# Patient Record
Sex: Female | Born: 1960 | Race: Black or African American | Hispanic: No | Marital: Married | Smoking: Former smoker
Health system: Southern US, Community
[De-identification: ages and names within clinical notes are randomized; demographics above are authoritative.]

## PROBLEM LIST (undated history)

## (undated) DIAGNOSIS — F32A Depression, unspecified: Secondary | ICD-10-CM

## (undated) DIAGNOSIS — E7800 Pure hypercholesterolemia, unspecified: Secondary | ICD-10-CM

## (undated) DIAGNOSIS — K589 Irritable bowel syndrome without diarrhea: Secondary | ICD-10-CM

## (undated) DIAGNOSIS — L309 Dermatitis, unspecified: Secondary | ICD-10-CM

## (undated) DIAGNOSIS — M545 Low back pain, unspecified: Secondary | ICD-10-CM

## (undated) DIAGNOSIS — T7840XA Allergy, unspecified, initial encounter: Secondary | ICD-10-CM

## (undated) DIAGNOSIS — A630 Anogenital (venereal) warts: Secondary | ICD-10-CM

## (undated) DIAGNOSIS — I499 Cardiac arrhythmia, unspecified: Secondary | ICD-10-CM

## (undated) DIAGNOSIS — I10 Essential (primary) hypertension: Secondary | ICD-10-CM

## (undated) DIAGNOSIS — D219 Benign neoplasm of connective and other soft tissue, unspecified: Secondary | ICD-10-CM

## (undated) DIAGNOSIS — E119 Type 2 diabetes mellitus without complications: Secondary | ICD-10-CM

## (undated) DIAGNOSIS — F329 Major depressive disorder, single episode, unspecified: Secondary | ICD-10-CM

## (undated) DIAGNOSIS — R011 Cardiac murmur, unspecified: Secondary | ICD-10-CM

## (undated) HISTORY — DX: Low back pain, unspecified: M54.50

## (undated) HISTORY — DX: Benign neoplasm of connective and other soft tissue, unspecified: D21.9

## (undated) HISTORY — DX: Low back pain: M54.5

## (undated) HISTORY — DX: Type 2 diabetes mellitus without complications: E11.9

## (undated) HISTORY — DX: Dermatitis, unspecified: L30.9

## (undated) HISTORY — DX: Cardiac arrhythmia, unspecified: I49.9

## (undated) HISTORY — PX: TUBAL LIGATION: SHX77

## (undated) HISTORY — DX: Irritable bowel syndrome, unspecified: K58.9

## (undated) HISTORY — DX: Essential (primary) hypertension: I10

## (undated) HISTORY — PX: MYOMECTOMY: SHX85

## (undated) HISTORY — DX: Major depressive disorder, single episode, unspecified: F32.9

## (undated) HISTORY — DX: Depression, unspecified: F32.A

## (undated) HISTORY — PX: LUMBAR LAMINECTOMY: SHX95

## (undated) HISTORY — DX: Anogenital (venereal) warts: A63.0

## (undated) HISTORY — DX: Cardiac murmur, unspecified: R01.1

## (undated) HISTORY — DX: Allergy, unspecified, initial encounter: T78.40XA

---

## 1999-08-11 ENCOUNTER — Encounter: Admission: RE | Admit: 1999-08-11 | Discharge: 1999-10-10 | Payer: Self-pay | Admitting: Orthopaedic Surgery

## 2000-01-11 ENCOUNTER — Encounter: Admission: RE | Admit: 2000-01-11 | Discharge: 2000-01-11 | Payer: Self-pay | Admitting: Family Medicine

## 2000-01-11 ENCOUNTER — Encounter: Payer: Self-pay | Admitting: Family Medicine

## 2001-01-06 ENCOUNTER — Emergency Department (HOSPITAL_COMMUNITY): Admission: EM | Admit: 2001-01-06 | Discharge: 2001-01-06 | Payer: Self-pay | Admitting: Emergency Medicine

## 2001-03-23 ENCOUNTER — Inpatient Hospital Stay (HOSPITAL_COMMUNITY): Admission: EM | Admit: 2001-03-23 | Discharge: 2001-03-27 | Payer: Self-pay

## 2001-03-25 ENCOUNTER — Encounter: Payer: Self-pay | Admitting: *Deleted

## 2001-08-02 ENCOUNTER — Emergency Department (HOSPITAL_COMMUNITY): Admission: EM | Admit: 2001-08-02 | Discharge: 2001-08-02 | Payer: Self-pay | Admitting: Emergency Medicine

## 2001-08-02 ENCOUNTER — Encounter: Payer: Self-pay | Admitting: Emergency Medicine

## 2002-09-14 ENCOUNTER — Other Ambulatory Visit: Admission: RE | Admit: 2002-09-14 | Discharge: 2002-09-14 | Payer: Self-pay | Admitting: Obstetrics and Gynecology

## 2002-11-04 ENCOUNTER — Ambulatory Visit (HOSPITAL_COMMUNITY): Admission: RE | Admit: 2002-11-04 | Discharge: 2002-11-04 | Payer: Self-pay | Admitting: Obstetrics and Gynecology

## 2002-11-04 ENCOUNTER — Encounter (INDEPENDENT_AMBULATORY_CARE_PROVIDER_SITE_OTHER): Payer: Self-pay

## 2003-01-14 ENCOUNTER — Encounter: Payer: Self-pay | Admitting: Emergency Medicine

## 2003-01-14 ENCOUNTER — Emergency Department (HOSPITAL_COMMUNITY): Admission: EM | Admit: 2003-01-14 | Discharge: 2003-01-14 | Payer: Self-pay | Admitting: Emergency Medicine

## 2004-11-03 ENCOUNTER — Emergency Department (HOSPITAL_COMMUNITY): Admission: EM | Admit: 2004-11-03 | Discharge: 2004-11-03 | Payer: Self-pay | Admitting: Emergency Medicine

## 2005-07-18 ENCOUNTER — Emergency Department (HOSPITAL_COMMUNITY): Admission: EM | Admit: 2005-07-18 | Discharge: 2005-07-18 | Payer: Self-pay | Admitting: Emergency Medicine

## 2005-12-03 ENCOUNTER — Emergency Department (HOSPITAL_COMMUNITY): Admission: EM | Admit: 2005-12-03 | Discharge: 2005-12-03 | Payer: Self-pay | Admitting: Family Medicine

## 2005-12-12 ENCOUNTER — Ambulatory Visit (HOSPITAL_COMMUNITY): Admission: RE | Admit: 2005-12-12 | Discharge: 2005-12-12 | Payer: Self-pay | Admitting: Internal Medicine

## 2005-12-12 ENCOUNTER — Ambulatory Visit: Payer: Self-pay | Admitting: Internal Medicine

## 2005-12-20 ENCOUNTER — Ambulatory Visit: Payer: Self-pay | Admitting: Internal Medicine

## 2005-12-22 ENCOUNTER — Ambulatory Visit (HOSPITAL_COMMUNITY): Admission: RE | Admit: 2005-12-22 | Discharge: 2005-12-22 | Payer: Self-pay | Admitting: Internal Medicine

## 2005-12-26 ENCOUNTER — Ambulatory Visit: Payer: Self-pay | Admitting: Internal Medicine

## 2005-12-31 ENCOUNTER — Ambulatory Visit: Payer: Self-pay | Admitting: Internal Medicine

## 2006-08-23 ENCOUNTER — Emergency Department (HOSPITAL_COMMUNITY): Admission: EM | Admit: 2006-08-23 | Discharge: 2006-08-23 | Payer: Self-pay | Admitting: Family Medicine

## 2006-09-18 ENCOUNTER — Encounter: Admission: RE | Admit: 2006-09-18 | Discharge: 2006-10-28 | Payer: Self-pay | Admitting: Specialist

## 2007-02-20 ENCOUNTER — Emergency Department (HOSPITAL_COMMUNITY): Admission: EM | Admit: 2007-02-20 | Discharge: 2007-02-21 | Payer: Self-pay | Admitting: Emergency Medicine

## 2007-02-21 ENCOUNTER — Ambulatory Visit: Payer: Self-pay | Admitting: Family Medicine

## 2007-02-21 LAB — CONVERTED CEMR LAB
ALT: 20 units/L (ref 0–40)
AST: 20 units/L (ref 0–37)
Basophils Relative: 0.4 % (ref 0.0–1.0)
Bilirubin, Direct: 0.2 mg/dL (ref 0.0–0.3)
Calcium: 9.8 mg/dL (ref 8.4–10.5)
Chloride: 105 meq/L (ref 96–112)
Eosinophils Absolute: 0.2 10*3/uL (ref 0.0–0.6)
Eosinophils Relative: 1.6 % (ref 0.0–5.0)
GFR calc non Af Amer: 97 mL/min
Glucose, Bld: 87 mg/dL (ref 70–99)
MCV: 88 fL (ref 78.0–100.0)
Platelets: 384 10*3/uL (ref 150–400)
RBC: 4.66 M/uL (ref 3.87–5.11)
WBC: 10.4 10*3/uL (ref 4.5–10.5)

## 2007-03-06 ENCOUNTER — Ambulatory Visit: Payer: Self-pay | Admitting: Family Medicine

## 2007-03-06 LAB — CONVERTED CEMR LAB
LDL Cholesterol: 106 mg/dL — ABNORMAL HIGH (ref 0–99)
Total CHOL/HDL Ratio: 3.8
Triglycerides: 79 mg/dL (ref 0–149)
VLDL: 16 mg/dL (ref 0–40)

## 2007-03-12 ENCOUNTER — Ambulatory Visit: Payer: Self-pay | Admitting: Family Medicine

## 2007-09-22 DIAGNOSIS — I10 Essential (primary) hypertension: Secondary | ICD-10-CM | POA: Insufficient documentation

## 2007-09-22 DIAGNOSIS — F329 Major depressive disorder, single episode, unspecified: Secondary | ICD-10-CM

## 2007-09-30 ENCOUNTER — Ambulatory Visit: Payer: Self-pay | Admitting: Family Medicine

## 2007-09-30 ENCOUNTER — Telehealth: Payer: Self-pay | Admitting: Family Medicine

## 2007-09-30 DIAGNOSIS — G5760 Lesion of plantar nerve, unspecified lower limb: Secondary | ICD-10-CM

## 2007-09-30 DIAGNOSIS — M79609 Pain in unspecified limb: Secondary | ICD-10-CM | POA: Insufficient documentation

## 2007-09-30 DIAGNOSIS — R109 Unspecified abdominal pain: Secondary | ICD-10-CM

## 2007-09-30 DIAGNOSIS — J0120 Acute ethmoidal sinusitis, unspecified: Secondary | ICD-10-CM | POA: Insufficient documentation

## 2007-10-01 LAB — CONVERTED CEMR LAB
ALT: 19 units/L (ref 0–35)
AST: 17 units/L (ref 0–37)
Alkaline Phosphatase: 102 units/L (ref 39–117)
Basophils Relative: 0.5 % (ref 0.0–1.0)
Bilirubin, Direct: 0.1 mg/dL (ref 0.0–0.3)
CO2: 30 meq/L (ref 19–32)
Calcium: 9.4 mg/dL (ref 8.4–10.5)
Chloride: 108 meq/L (ref 96–112)
Eosinophils Relative: 1.8 % (ref 0.0–5.0)
Glucose, Bld: 98 mg/dL (ref 70–99)
Lymphocytes Relative: 35.8 % (ref 12.0–46.0)
Platelets: 364 10*3/uL (ref 150–400)
RBC: 4.28 M/uL (ref 3.87–5.11)
RDW: 13.7 % (ref 11.5–14.6)
Total Protein: 6.5 g/dL (ref 6.0–8.3)
WBC: 9.4 10*3/uL (ref 4.5–10.5)

## 2008-01-12 ENCOUNTER — Ambulatory Visit: Payer: Self-pay | Admitting: Family Medicine

## 2008-01-12 DIAGNOSIS — R1031 Right lower quadrant pain: Secondary | ICD-10-CM

## 2008-01-15 ENCOUNTER — Ambulatory Visit: Payer: Self-pay | Admitting: Cardiology

## 2008-12-17 HISTORY — PX: LUMBAR LAMINECTOMY: SHX95

## 2009-08-09 ENCOUNTER — Ambulatory Visit: Payer: Self-pay | Admitting: Family Medicine

## 2009-08-09 DIAGNOSIS — M76899 Other specified enthesopathies of unspecified lower limb, excluding foot: Secondary | ICD-10-CM | POA: Insufficient documentation

## 2009-08-26 ENCOUNTER — Telehealth: Payer: Self-pay | Admitting: Family Medicine

## 2009-08-30 ENCOUNTER — Ambulatory Visit: Payer: Self-pay | Admitting: Family Medicine

## 2009-08-30 DIAGNOSIS — M5430 Sciatica, unspecified side: Secondary | ICD-10-CM | POA: Insufficient documentation

## 2009-10-27 ENCOUNTER — Ambulatory Visit: Payer: Self-pay | Admitting: Family Medicine

## 2009-12-19 ENCOUNTER — Ambulatory Visit: Payer: Self-pay | Admitting: Family Medicine

## 2009-12-19 LAB — CONVERTED CEMR LAB
Bilirubin Urine: NEGATIVE
Protein, U semiquant: NEGATIVE
Urobilinogen, UA: 0.2
WBC Urine, dipstick: NEGATIVE

## 2009-12-21 ENCOUNTER — Telehealth: Payer: Self-pay | Admitting: Family Medicine

## 2009-12-22 LAB — CONVERTED CEMR LAB
ALT: 16 units/L (ref 0–35)
AST: 17 units/L (ref 0–37)
Alkaline Phosphatase: 88 units/L (ref 39–117)
BUN: 8 mg/dL (ref 6–23)
Basophils Absolute: 0 10*3/uL (ref 0.0–0.1)
Bilirubin, Direct: 0 mg/dL (ref 0.0–0.3)
Calcium: 9 mg/dL (ref 8.4–10.5)
Cholesterol: 189 mg/dL (ref 0–200)
Eosinophils Relative: 2.3 % (ref 0.0–5.0)
GFR calc non Af Amer: 115.41 mL/min (ref >60)
Glucose, Bld: 121 mg/dL — ABNORMAL HIGH (ref 70–99)
HCT: 41.8 % (ref 36.0–46.0)
HCV Ab: NEGATIVE
HDL: 50.3 mg/dL (ref >39.00)
Hep B S Ab: POSITIVE — AB
LDL Cholesterol: 110 mg/dL — ABNORMAL HIGH (ref 0–99)
Lymphocytes Relative: 31 % (ref 12.0–46.0)
Lymphs Abs: 2.5 10*3/uL (ref 0.7–4.0)
Monocytes Relative: 6.7 % (ref 3.0–12.0)
Neutrophils Relative %: 59.5 % (ref 43.0–77.0)
Platelets: 322 10*3/uL (ref 150.0–400.0)
Potassium: 3.8 meq/L (ref 3.5–5.1)
RDW: 13.5 % (ref 11.5–14.6)
Total Bilirubin: 0.7 mg/dL (ref 0.3–1.2)
VLDL: 28.6 mg/dL (ref 0.0–40.0)
WBC: 8 10*3/uL (ref 4.5–10.5)

## 2009-12-23 ENCOUNTER — Encounter: Payer: Self-pay | Admitting: Family Medicine

## 2009-12-23 ENCOUNTER — Encounter: Admission: RE | Admit: 2009-12-23 | Discharge: 2009-12-23 | Payer: Self-pay | Admitting: Orthopedic Surgery

## 2009-12-26 ENCOUNTER — Ambulatory Visit: Payer: Self-pay | Admitting: Family Medicine

## 2009-12-26 ENCOUNTER — Other Ambulatory Visit: Admission: RE | Admit: 2009-12-26 | Discharge: 2009-12-26 | Payer: Self-pay | Admitting: Family Medicine

## 2009-12-26 LAB — HM PAP SMEAR

## 2009-12-28 ENCOUNTER — Encounter: Payer: Self-pay | Admitting: Family Medicine

## 2010-01-05 ENCOUNTER — Encounter: Payer: Self-pay | Admitting: Family Medicine

## 2010-01-06 ENCOUNTER — Ambulatory Visit: Payer: Self-pay | Admitting: Family Medicine

## 2010-01-06 DIAGNOSIS — B373 Candidiasis of vulva and vagina: Secondary | ICD-10-CM

## 2010-01-06 DIAGNOSIS — M545 Low back pain: Secondary | ICD-10-CM

## 2010-01-06 DIAGNOSIS — F331 Major depressive disorder, recurrent, moderate: Secondary | ICD-10-CM

## 2010-01-23 ENCOUNTER — Ambulatory Visit: Payer: Self-pay | Admitting: Family Medicine

## 2010-01-23 DIAGNOSIS — J209 Acute bronchitis, unspecified: Secondary | ICD-10-CM

## 2010-01-23 LAB — CONVERTED CEMR LAB
Glucose, Urine, Semiquant: NEGATIVE
Protein, U semiquant: NEGATIVE
WBC Urine, dipstick: NEGATIVE
pH: 5

## 2010-02-23 ENCOUNTER — Inpatient Hospital Stay (HOSPITAL_COMMUNITY): Admission: RE | Admit: 2010-02-23 | Discharge: 2010-02-26 | Payer: Self-pay | Admitting: Specialist

## 2010-02-28 ENCOUNTER — Encounter: Payer: Self-pay | Admitting: Family Medicine

## 2010-03-16 ENCOUNTER — Telehealth: Payer: Self-pay | Admitting: Family Medicine

## 2010-03-17 ENCOUNTER — Ambulatory Visit: Payer: Self-pay | Admitting: Family Medicine

## 2010-03-17 DIAGNOSIS — R079 Chest pain, unspecified: Secondary | ICD-10-CM

## 2010-03-17 DIAGNOSIS — R002 Palpitations: Secondary | ICD-10-CM

## 2010-03-17 DIAGNOSIS — K219 Gastro-esophageal reflux disease without esophagitis: Secondary | ICD-10-CM | POA: Insufficient documentation

## 2010-06-14 ENCOUNTER — Telehealth: Payer: Self-pay | Admitting: Family Medicine

## 2010-06-20 ENCOUNTER — Telehealth: Payer: Self-pay | Admitting: Family Medicine

## 2010-06-23 ENCOUNTER — Ambulatory Visit: Payer: Self-pay | Admitting: Family Medicine

## 2010-06-27 LAB — CONVERTED CEMR LAB
Glucose, Bld: 101 mg/dL — ABNORMAL HIGH (ref 70–99)
HDL: 41.9 mg/dL (ref >39.00)
Hgb A1c MFr Bld: 5.7 % (ref 4.6–6.5)
Total CHOL/HDL Ratio: 4
VLDL: 18.8 mg/dL (ref 0.0–40.0)

## 2010-06-28 ENCOUNTER — Ambulatory Visit: Payer: Self-pay | Admitting: Family Medicine

## 2010-06-28 DIAGNOSIS — N760 Acute vaginitis: Secondary | ICD-10-CM

## 2010-06-29 ENCOUNTER — Observation Stay (HOSPITAL_COMMUNITY): Admission: AD | Admit: 2010-06-29 | Discharge: 2010-06-30 | Payer: Self-pay | Admitting: Internal Medicine

## 2010-06-29 ENCOUNTER — Ambulatory Visit: Payer: Self-pay | Admitting: Diagnostic Radiology

## 2010-06-29 ENCOUNTER — Ambulatory Visit: Payer: Self-pay | Admitting: Cardiovascular Disease

## 2010-06-29 ENCOUNTER — Encounter: Payer: Self-pay | Admitting: Emergency Medicine

## 2010-06-30 ENCOUNTER — Encounter (INDEPENDENT_AMBULATORY_CARE_PROVIDER_SITE_OTHER): Payer: Self-pay | Admitting: Internal Medicine

## 2010-07-03 ENCOUNTER — Telehealth: Payer: Self-pay | Admitting: Family Medicine

## 2010-07-12 ENCOUNTER — Ambulatory Visit: Payer: Self-pay | Admitting: Family Medicine

## 2010-07-12 DIAGNOSIS — R55 Syncope and collapse: Secondary | ICD-10-CM

## 2010-09-11 ENCOUNTER — Ambulatory Visit: Payer: Self-pay | Admitting: Family Medicine

## 2010-09-11 DIAGNOSIS — M1990 Unspecified osteoarthritis, unspecified site: Secondary | ICD-10-CM | POA: Insufficient documentation

## 2010-11-12 ENCOUNTER — Emergency Department (HOSPITAL_COMMUNITY): Admission: EM | Admit: 2010-11-12 | Discharge: 2010-11-12 | Payer: Self-pay | Admitting: Emergency Medicine

## 2010-11-13 ENCOUNTER — Ambulatory Visit: Payer: Self-pay | Admitting: Family Medicine

## 2011-01-07 ENCOUNTER — Encounter: Payer: Self-pay | Admitting: Internal Medicine

## 2011-01-15 ENCOUNTER — Encounter: Payer: Self-pay | Admitting: Family Medicine

## 2011-01-16 NOTE — Assessment & Plan Note (Signed)
Summary: cough/chest congestion/low grade fever/cjr   Vital Signs:  Patient profile:   50 year old female Menstrual status:  regular Weight:      183 pounds Temp:     97.8 degrees F oral Pulse rate:   76 / minute BP sitting:   142 / 100  (left arm) Cuff size:   large  Vitals Entered By: Alfred Levins, CMA (January 23, 2010 10:55 AM) CC: cough x2 wks, dysuria   History of Present Illness: Here for 2 weeks of chest congestion and coughing up yellow sputum. Low grade fevers off and on. No HA or ST or NVD. On Nyquil.   Current Medications (verified): 1)  Lisinopril 20 Mg  Tabs (Lisinopril) .Marland Kitchen.. 1 By Mouth Once Daily 2)  Hydrocodone-Acetaminophen 5-500 Mg Tabs (Hydrocodone-Acetaminophen) .Marland Kitchen.. 1 By Mouth Once Daily 3)  Voltaren 1 % Gel (Diclofenac Sodium) .... Once Daily 4)  Diclofenac Sodium 50 Mg Tbec (Diclofenac Sodium) .Marland Kitchen.. 1 By Mouth 2 Times Daily. Take With Food 5)  Lexapro 10 Mg Tabs (Escitalopram Oxalate) .... Once Daily  Allergies (verified): No Known Drug Allergies  Past History:  Past Medical History: Reviewed history from 01/06/2010 and no changes required. Depression Hypertension UTIs Genital Warts Heart Arrhythmia Heart Murmur uterine fibroids, sees Dr. Dareen Piano Low back pain, has a herniated lumbar disc, sees Dr. August Saucer  Review of Systems  The patient denies anorexia, weight loss, weight gain, vision loss, decreased hearing, hoarseness, chest pain, syncope, dyspnea on exertion, peripheral edema, headaches, hemoptysis, abdominal pain, melena, hematochezia, severe indigestion/heartburn, hematuria, incontinence, genital sores, muscle weakness, suspicious skin lesions, transient blindness, difficulty walking, depression, unusual weight change, abnormal bleeding, enlarged lymph nodes, angioedema, breast masses, and testicular masses.    Physical Exam  General:  Well-developed,well-nourished,in no acute distress; alert,appropriate and cooperative throughout  examination Head:  Normocephalic and atraumatic without obvious abnormalities. No apparent alopecia or balding. Eyes:  No corneal or conjunctival inflammation noted. EOMI. Perrla. Funduscopic exam benign, without hemorrhages, exudates or papilledema. Vision grossly normal. Ears:  External ear exam shows no significant lesions or deformities.  Otoscopic examination reveals clear canals, tympanic membranes are intact bilaterally without bulging, retraction, inflammation or discharge. Hearing is grossly normal bilaterally. Nose:  External nasal examination shows no deformity or inflammation. Nasal mucosa are pink and moist without lesions or exudates. Mouth:  Oral mucosa and oropharynx without lesions or exudates.  Teeth in good repair. Neck:  No deformities, masses, or tenderness noted. Lungs:  scattered rhonchi and wheezes, no rales   Impression & Recommendations:  Problem # 1:  ACUTE BRONCHITIS (ICD-466.0)  Her updated medication list for this problem includes:    Augmentin 875-125 Mg Tabs (Amoxicillin-pot clavulanate) .Marland Kitchen..Marland Kitchen Two times a day    Hydromet 5-1.5 Mg/75ml Syrp (Hydrocodone-homatropine) .Marland Kitchen... 1 tsp q 4 hours as needed cough    Proair Hfa 108 (90 Base) Mcg/act Aers (Albuterol sulfate) .Marland Kitchen... 2 puffs q 4 hours as needed sob  Orders: Rocephin  250mg  (Z6109) Admin of Therapeutic Inj  intramuscular or subcutaneous (60454)  Complete Medication List: 1)  Lisinopril 20 Mg Tabs (Lisinopril) .Marland Kitchen.. 1 by mouth once daily 2)  Hydrocodone-acetaminophen 5-500 Mg Tabs (Hydrocodone-acetaminophen) .Marland Kitchen.. 1 by mouth once daily 3)  Voltaren 1 % Gel (Diclofenac sodium) .... Once daily 4)  Diclofenac Sodium 50 Mg Tbec (Diclofenac sodium) .Marland Kitchen.. 1 by mouth 2 times daily. take with food 5)  Lexapro 10 Mg Tabs (Escitalopram oxalate) .... Once daily 6)  Augmentin 875-125 Mg Tabs (Amoxicillin-pot clavulanate) .... Two times  a day 7)  Hydromet 5-1.5 Mg/81ml Syrp (Hydrocodone-homatropine) .Marland Kitchen.. 1 tsp q 4 hours as  needed cough 8)  Proair Hfa 108 (90 Base) Mcg/act Aers (Albuterol sulfate) .... 2 puffs q 4 hours as needed sob  Other Orders: UA Dipstick w/o Micro (manual) (27253)  Patient Instructions: 1)  Please schedule a follow-up appointment as needed . Out of work from 01-22-10 until 01-25-10.  Prescriptions: PROAIR HFA 108 (90 BASE) MCG/ACT AERS (ALBUTEROL SULFATE) 2 puffs q 4 hours as needed sob  #1 x 2   Entered and Authorized by:   Nelwyn Salisbury MD   Signed by:   Nelwyn Salisbury MD on 01/23/2010   Method used:   Print then Give to Patient   RxID:   [id number] HYDROMET 5-1.5 MG/5ML SYRP (HYDROCODONE-HOMATROPINE) 1 tsp q 4 hours as needed cough  #240 x 0   Entered and Authorized by:   Nelwyn Salisbury MD   Signed by:   Nelwyn Salisbury MD on 01/23/2010   Method used:   Print then Give to Patient   RxID:   [id number] AUGMENTIN 875-125 MG TABS (AMOXICILLIN-POT CLAVULANATE) two times a day  #20 x 0   Entered and Authorized by:   Nelwyn Salisbury MD   Signed by:   Nelwyn Salisbury MD on 01/23/2010   Method used:   Print then Give to Patient   RxID:   [id number]   Laboratory Results   Urine Tests   Date/Time Reported: January 23, 2010 10:56 AM  Routine Urinalysis   Color: yellow Appearance: Cloudy Glucose: negative   (Normal Range: Negative) Bilirubin: small   (Normal Range: Negative) Ketone: negative   (Normal Range: Negative) Spec. Gravity: >=1.030   (Normal Range: 1.003-1.035) Blood: trace-lysed   (Normal Range: Negative) pH: 5.0   (Normal Range: 5.0-8.0) Protein: negative   (Normal Range: Negative) Urobilinogen: 0.2   (Normal Range: 0-1) Nitrite: negative   (Normal Range: Negative) Leukocyte Esterace: negative   (Normal Range: Negative)    Comments: Alfred Levins, CMA  January 23, 2010 11:02 AM      Medication Administration  Injection # 1:    Medication: Rocephin  250mg     Diagnosis: ACUTE BRONCHITIS (ICD-466.0)    Route: IM    Site: RUOQ gluteus    Exp Date:  9/13    Lot #: [id number]    Mfr: Novaplus    Comments: 1g    Patient tolerated injection without complications    Given by: Alfred Levins, CMA (January 23, 2010 3:43 PM)  Orders Added: 1)  UA Dipstick w/o Micro (manual) [81002] 2)  Est. Patient Level IV [42706] 3)  Rocephin  250mg  [J0696] 4)  Admin of Therapeutic Inj  intramuscular or subcutaneous [23762]

## 2011-01-16 NOTE — Progress Notes (Signed)
Summary: REQ FOR LABWORK  Phone Note Call from Patient   Caller: Patient Reason for Call: Lab or Test Results Summary of Call: Pt called to make appt for labwork... adv she is having health issues and wants to have labwork this week so that it will be available for her appt with Dr Clent Ridges next week.... EMR shows that labwork in January 2011 showed slightly high levels for cholesterol and glucose - pt request that these be re-checked and wants to come in this week for labwork (wants results to be available for Dr Clent Ridges next week).... Pt adv that she is feeling bad (weak / lethargic), doesn't want to come in for OV because she wants to see Dr Clent Ridges but would like to have labwork.Marland KitchenMarland KitchenMarland KitchenPt adv that at the time of her cpx in Jan 2011, Dr Clent Ridges said to re-ck labs in 6 mths but there isn't an order for labs...Marland KitchenMarland KitchenCan you advise order?   Initial call taken by: Debbra Riding,  June 20, 2010 2:38 PM  Follow-up for Phone Call        OK to check CBG, lipids , and A1C.  Pt has to understand that any other labs that might need to be checked relevant to fatigue would have to be determined after office assessment. Follow-up by: [name] MD,  June 20, 2010 4:09 PM  Additional Follow-up for Phone Call Additional follow up Details #1::        Called pt and left msg for her to call back so that appt for labwork can be scheduled.  Additional Follow-up by: Debbra Riding,  June 20, 2010 4:30 PM    Additional Follow-up for Phone Call Additional follow up Details #2::    Contacted pt at work # and advised her of Dr Mar Daring instructions .Marland KitchenMarland KitchenMarland Kitchenappt for labwork was scheduled for Thursday, 06/22/2010 at [facility]... Pt acknowledged same.  Follow-up by: Debbra Riding,  June 20, 2010 4:36 PM

## 2011-01-16 NOTE — Letter (Signed)
Summary: Application for Handicapped Placard  Application for Handicapped Placard   Imported By: Maryln Gottron 09/12/2010 15:43:40  _____________________________________________________________________  External Attachment:    Type:   Image     Comment:   External Document

## 2011-01-16 NOTE — Assessment & Plan Note (Signed)
Summary: recheck- ok per debby//ccm   Vital Signs:  Patient profile:   50 year old female Menstrual status:  regular Weight:      181 pounds Temp:     97.7 degrees F oral Pulse rate:   84 / minute BP sitting:   162 / 100  (left arm) Cuff size:   large  Vitals Entered By: Alfred Levins, CMA (January 06, 2010 10:10 AM) CC: yeast infection   History of Present Illness: Here for 2 reasons. First she continues to have a vaginal tan or white DC, and now it is very itchy. On 12-26-09 she was here for a Pap smear, and she had signs of a bacterial infection. She took a course of Flagyl, and in some ways got better. however the DC changed as above. He Pap smear showed showed some fungal elements. second, she has been very streesed lately, especially by her 4 children. They have things going on in their lives that she worries about. She gets sad, angry, tired, and tearful. Dr. Dareen Piano had treated her briefly with Lexapro about 2 years ago for similar symptoms, and this was very effective. She has used prozac in the past as well, but did not tolerate it well. She is exercising and losing weight.   Current Medications (verified): 1)  Lisinopril 20 Mg  Tabs (Lisinopril) .Marland Kitchen.. 1 By Mouth Once Daily 2)  Meloxicam 7.5 Mg Tabs (Meloxicam) .... As Needed 3)  Hydrocodone-Acetaminophen 5-500 Mg Tabs (Hydrocodone-Acetaminophen) .Marland Kitchen.. 1 By Mouth Once Daily 4)  Voltaren 1 % Gel (Diclofenac Sodium) .... Once Daily 5)  Diclofenac Sodium 50 Mg Tbec (Diclofenac Sodium) .Marland Kitchen.. 1 By Mouth 2 Times Daily. Take With Food  Allergies (verified): No Known Drug Allergies  Past History:  Past Medical History: Depression Hypertension UTIs Genital Warts Heart Arrhythmia Heart Murmur uterine fibroids, sees Dr. Dareen Piano Low back pain, has a herniated lumbar disc, sees Dr. August Saucer  Past Surgical History: Reviewed history from 12/26/2009 and no changes required. Tubal ligation laser ablation of uterine fibroids, per Dr.  Dareen Piano in 2000  Review of Systems  The patient denies anorexia, fever, weight gain, vision loss, decreased hearing, hoarseness, chest pain, syncope, dyspnea on exertion, peripheral edema, prolonged cough, headaches, hemoptysis, abdominal pain, melena, hematochezia, severe indigestion/heartburn, hematuria, incontinence, genital sores, muscle weakness, suspicious skin lesions, transient blindness, difficulty walking, unusual weight change, abnormal bleeding, enlarged lymph nodes, angioedema, breast masses, and testicular masses.    Physical Exam  General:  Well-developed,well-nourished,in no acute distress; alert,appropriate and cooperative throughout examination Psych:  Oriented X3, memory intact for recent and remote, normally interactive, good eye contact, depressed affect, and tearful.     Impression & Recommendations:  Problem # 1:  DEPRESSION, MODERATE, RECURRENT (ICD-296.32)  Problem # 2:  CANDIDIASIS OF VULVA AND VAGINA (ICD-112.1)  Her updated medication list for this problem includes:    Fluconazole 150 Mg Tabs (Fluconazole) .Marland Kitchen... As needed  Complete Medication List: 1)  Lisinopril 20 Mg Tabs (Lisinopril) .Marland Kitchen.. 1 by mouth once daily 2)  Meloxicam 7.5 Mg Tabs (Meloxicam) .... As needed 3)  Hydrocodone-acetaminophen 5-500 Mg Tabs (Hydrocodone-acetaminophen) .Marland Kitchen.. 1 by mouth once daily 4)  Voltaren 1 % Gel (Diclofenac sodium) .... Once daily 5)  Diclofenac Sodium 50 Mg Tbec (Diclofenac sodium) .Marland Kitchen.. 1 by mouth 2 times daily. take with food 6)  Lexapro 10 Mg Tabs (Escitalopram oxalate) .... Once daily 7)  Fluconazole 150 Mg Tabs (Fluconazole) .... As needed  Patient Instructions: 1)  Please schedule a follow-up appointment  in 2 weeks.  Prescriptions: FLUCONAZOLE 150 MG TABS (FLUCONAZOLE) as needed  #1 x 5   Entered and Authorized by:   Nelwyn Salisbury MD   Signed by:   Nelwyn Salisbury MD on 01/06/2010   Method used:   Electronically to        [facility]  [address] [id number] (retail)        [address]       [place], [place]  [address]       Ph: [phone] or [phone]       Fax: [phone]   RxID:   [id number] LEXAPRO 10 MG TABS (ESCITALOPRAM OXALATE) once daily  #30 x 11   Entered and Authorized by:   Nelwyn Salisbury MD   Signed by:   Nelwyn Salisbury MD on 01/06/2010   Method used:   [facility]ically to        [facility]  [address] [id number] ([facility]       [address]       [place], [place]  [address]       Ph: [phone] or [phone]       Fax: [phone]   RxID:   [id number]

## 2011-01-16 NOTE — Assessment & Plan Note (Signed)
Summary: er fup//ccm   Vital Signs:  Patient profile:   50 year old female Menstrual status:  regular Weight:      184 pounds O2 Sat:      97 % Temp:     98.3 degrees F Pulse rate:   58 / minute BP sitting:   110 / 78  (left arm) Cuff size:   regular  Vitals Entered By: Pura Spice, RN (November 13, 2010 1:12 PM) CC: went to er yest after passing out at work . see ER reports   History of Present Illness: Here to follow up an ER visit yesterday for left flank pain and for a syncopal episode. For the past month she has had a persistent sharp pain in the left flank which starts in the left side of her back and radiates around to the front. She did have disc surgery in March of this year, and it went well at that time. Lately this left sided pain has been getting worse to the point that Etodolac and Vicodin 5/500 does not help. Yesterday at work she has some sharp pains here and suddenly fainted while at work. Her co-workers were able to catch her and lay her down before she fell. At the ER all tests were normal including labs and an abdominal CT scan. They gave her Percocet, but she cannot take this because it makes her sick. She has been out of work since then. Today the pain persists, but it is not as severe. No other abdominal symptoms. No nausea or fever. No urinary problems.   Allergies: 1)  ! * Hctz  Past History:  Past Medical History: Reviewed history from 03/17/2010 and no changes required. Depression Hypertension UTIs Genital Warts Heart Arrhythmia Heart Murmur uterine fibroids, sees Dr. Dareen Piano Low back pain, has a herniated lumbar disc, sees Dr. August Saucer multiple allergies, sees Dr. Stevphen Rochester  Past Surgical History: Tubal ligation laser ablation of uterine fibroids, per Dr. Dareen Piano in 2000 epidural steroid shot ot lower back per Dr. Naaman Plummer Jan. 2011 Lumbar laminectomy at L3-4 on 02-24-10 per Dr. Otelia Sergeant  Review of Systems  The patient denies anorexia,  fever, weight loss, weight gain, vision loss, decreased hearing, hoarseness, chest pain, syncope, dyspnea on exertion, peripheral edema, prolonged cough, headaches, hemoptysis, melena, hematochezia, severe indigestion/heartburn, hematuria, incontinence, genital sores, muscle weakness, suspicious skin lesions, transient blindness, difficulty walking, depression, unusual weight change, abnormal bleeding, enlarged lymph nodes, angioedema, breast masses, and testicular masses.    Physical Exam  General:  alert but uncomfortable. Walks with assistance.  Lungs:  Normal respiratory effort, chest expands symmetrically. Lungs are clear to auscultation, no crackles or wheezes. Heart:  Normal rate and regular rhythm. S1 and S2 normal without gallop, murmur, click, rub or other extra sounds. Abdomen:  Bowel sounds positive,abdomen soft and non-tender without masses, organomegaly or hernias noted. Msk:  tender over the left mid back and the left lower ribs. No masses. Full  ROM.    Impression & Recommendations:  Problem # 1:  FLANK PAIN (ICD-789.09)  Her updated medication list for this problem includes:    Etodolac 500 Mg Tabs (Etodolac) .Marland Kitchen..Marland Kitchen Two times a day    Oxycodone-acetaminophen 7.5-500 Mg Tabs (Oxycodone-acetaminophen)    Methocarbamol 750 Mg Tabs (Methocarbamol)    Vicodin Hp 10-660 Mg Tabs (Hydrocodone-acetaminophen) .Marland Kitchen... 1 q 6 hours as needed pain  Complete Medication List: 1)  Allergy Shots  2)  Lexapro 20 Mg Tabs (Escitalopram oxalate) .... Once daily 3)  Lisinopril 20 Mg Tabs (Lisinopril) .Marland Kitchen.. 1 once daily 4)  Amlodipine Besylate 5 Mg Tabs (Amlodipine besylate) .Marland Kitchen.. 1 once daily 5)  Etodolac 500 Mg Tabs (Etodolac) .... Two times a day 6)  Oxycodone-acetaminophen 7.5-500 Mg Tabs (Oxycodone-acetaminophen) 7)  Methocarbamol 750 Mg Tabs (Methocarbamol) 8)  Vicodin Hp 10-660 Mg Tabs (Hydrocodone-acetaminophen) .Marland Kitchen.. 1 q 6 hours as needed pain  Patient Instructions: 1)  This seems to be  stemming from her spinal problems. Try Vicodin 10/660 for pain. She will get in to see Dr. Otelia Sergeant ASAP. We wrote for her to be out of work from 11-12-10 thorugh 11-19-10. Prescriptions: VICODIN HP 10-660 MG TABS (HYDROCODONE-ACETAMINOPHEN) 1 q 6 hours as needed pain  #60 x 0   Entered and Authorized by:   Nelwyn Salisbury MD   Signed by:   Nelwyn Salisbury MD on 11/13/2010   Method used:   Print then Give to Patient   RxID:   [id number]    Orders Added: 1)  Est. Patient Level IV [56213]

## 2011-01-16 NOTE — Assessment & Plan Note (Signed)
Summary: BODY PAINS//CCM   Vital Signs:  Patient profile:   50 year old female Menstrual status:  regular Weight:      187 pounds O2 Sat:      98 % Temp:     98.1 degrees F Pulse rate:   70 / minute BP sitting:   130 / 80  (left arm)  Vitals Entered By: Pura Spice, RN (September 11, 2010 8:34 AM) CC: c/o stiffness in am.  stiff whn getting oob Is Patient Diabetic? No   History of Present Illness: Here for 3 months of diffuse joint stiffness and aches which are worse after she has been sleeping or sitting for long periods. Then when she gets up and moves around, the stiffness goes away after 5-10 minutes. Motrin helps.   Allergies: 1)  ! * Hctz  Past History:  Past Medical History: Reviewed history from 03/17/2010 and no changes required. Depression Hypertension UTIs Genital Warts Heart Arrhythmia Heart Murmur uterine fibroids, sees Dr. Dareen Piano Low back pain, has a herniated lumbar disc, sees Dr. August Saucer multiple allergies, sees Dr. Stevphen Rochester  Past Surgical History: Tubal ligation laser ablation of uterine fibroids, per Dr. Dareen Piano in 2000 epidural steroid shot ot lower back per Dr. Naaman Plummer Jan. 2011  Review of Systems  The patient denies anorexia, fever, weight loss, weight gain, vision loss, decreased hearing, hoarseness, chest pain, syncope, dyspnea on exertion, peripheral edema, prolonged cough, headaches, hemoptysis, abdominal pain, melena, hematochezia, severe indigestion/heartburn, hematuria, incontinence, genital sores, muscle weakness, suspicious skin lesions, transient blindness, difficulty walking, depression, unusual weight change, abnormal bleeding, enlarged lymph nodes, angioedema, breast masses, and testicular masses.         Flu Vaccine Consent Questions     Do you have a history of severe allergic reactions to this vaccine? no    Any prior history of allergic reactions to egg and/or gelatin? no    Do you have a sensitivity to the  preservative Thimersol? no    Do you have a past history of Guillan-Barre Syndrome? no    Do you currently have an acute febrile illness? no    Have you ever had a severe reaction to latex? no    Vaccine information given and explained to patient? yes    Are you currently pregnant? no    Lot Number:[id number]   Exp Date:06/16/2011   Site Given  Left Deltoid IM Pura Spice, RN  September 11, 2010 8:35 AM   Physical Exam  General:  Well-developed,well-nourished,in no acute distress; alert,appropriate and cooperative throughout examination Lungs:  Normal respiratory effort, chest expands symmetrically. Lungs are clear to auscultation, no crackles or wheezes. Heart:  Normal rate and regular rhythm. S1 and S2 normal without gallop, murmur, click, rub or other extra sounds. Msk:  No deformity or scoliosis noted of thoracic or lumbar spine.   Extremities:  No clubbing, cyanosis, edema, or deformity noted with normal full range of motion of all joints.     Impression & Recommendations:  Problem # 1:  DEGENERATIVE JOINT DISEASE (ICD-715.90)  Her updated medication list for this problem includes:    Etodolac 500 Mg Tabs (Etodolac) .Marland Kitchen..Marland Kitchen Two times a day  Problem # 2:  HYPERTENSION (ICD-401.9)  Her updated medication list for this problem includes:    Lisinopril 20 Mg Tabs (Lisinopril) .Marland Kitchen... 1 once daily    Amlodipine Besylate 5 Mg Tabs (Amlodipine besylate) .Marland Kitchen... 1 once daily  Complete Medication List: 1)  Allergy Shots  2)  Lexapro  20 Mg Tabs (Escitalopram oxalate) .... Once daily 3)  Lisinopril 20 Mg Tabs (Lisinopril) .Marland Kitchen.. 1 once daily 4)  Amlodipine Besylate 5 Mg Tabs (Amlodipine besylate) .Marland Kitchen.. 1 once daily 5)  Etodolac 500 Mg Tabs (Etodolac) .... Two times a day  Other Orders: Admin 1st Vaccine (16109) Flu Vaccine 62yrs + (60454)  Patient Instructions: 1)  Please schedule a follow-up appointment as needed .  Prescriptions: ETODOLAC 500 MG TABS (ETODOLAC) two times a day  #60  x 11   Entered and Authorized by:   Nelwyn Salisbury MD   Signed by:   Nelwyn Salisbury MD on 09/11/2010   Method used:   [facility] to        [facility]  [address] [id number]       [address]       [place], [place]  [address]       Ph: [phone] or [phone]       Fax: [phone]   RxID:   [id number]

## 2011-01-16 NOTE — Assessment & Plan Note (Signed)
Summary: hosp fup---ok per judy//ccm   Vital Signs:  Patient profile:   50 year old female Menstrual status:  regular Weight:      182 pounds Pulse rate:   72 / minute Pulse rhythm:   regular BP sitting:   102 / 80  (left arm) Cuff size:   regular  Vitals Entered By: Raechel Ache, RN (July 12, 2010 4:10 PM) CC: Hosp f/u, feels better.   History of Present Illness: Here to follow up after a brief hospital stay from 06-29-10 to 06-30-10 for syncope. She had been switched from Lisinopril to Losartan HCT the day before, and she had been up all night the night before. Apparently she had a bed reaction to HCTZ about 10 years ago as well. She was taken off Losartan HCT and was sent home on Lisinopril and Amlodipine. She has done well since, she feels good, and her BP has been running 110-120 over 80. Her workup at that time included labs and a head CT, which were normal.   Allergies (verified): 1)  ! * Hctz  Past History:  Past Medical History: Reviewed history from 03/17/2010 and no changes required. Depression Hypertension UTIs Genital Warts Heart Arrhythmia Heart Murmur uterine fibroids, sees Dr. Dareen Piano Low back pain, has a herniated lumbar disc, sees Dr. August Saucer multiple allergies, sees Dr. Stevphen Rochester  Review of Systems  The patient denies anorexia, fever, weight loss, weight gain, vision loss, decreased hearing, hoarseness, chest pain, syncope, dyspnea on exertion, peripheral edema, prolonged cough, headaches, hemoptysis, abdominal pain, melena, hematochezia, severe indigestion/heartburn, hematuria, incontinence, genital sores, muscle weakness, suspicious skin lesions, transient blindness, difficulty walking, depression, unusual weight change, abnormal bleeding, enlarged lymph nodes, angioedema, breast masses, and testicular masses.    Physical Exam  General:  Well-developed,well-nourished,in no acute distress; alert,appropriate and cooperative throughout  examination Neck:  No deformities, masses, or tenderness noted. Lungs:  Normal respiratory effort, chest expands symmetrically. Lungs are clear to auscultation, no crackles or wheezes. Heart:  Normal rate and regular rhythm. S1 and S2 normal without gallop, murmur, click, rub or other extra sounds. Neurologic:  alert & oriented X3 and gait normal.     Impression & Recommendations:  Problem # 1:  SYNCOPE (ICD-780.2)  Problem # 2:  DEPRESSION, MODERATE, RECURRENT (ICD-296.32)  Problem # 3:  HYPERTENSION (ICD-401.9)  The following medications were removed from the medication list:    Losartan Potassium-hctz 100-25 Mg Tabs (Losartan potassium-hctz) ..... Once daily Her updated medication list for this problem includes:    Lisinopril 20 Mg Tabs (Lisinopril) .Marland Kitchen... 1 once daily    Amlodipine Besylate 5 Mg Tabs (Amlodipine besylate) .Marland Kitchen... 1 once daily  Problem # 4:  GERD (ICD-530.81)  Complete Medication List: 1)  Allergy Shots  2)  Lexapro 20 Mg Tabs (Escitalopram oxalate) .... Once daily 3)  Lisinopril 20 Mg Tabs (Lisinopril) .Marland Kitchen.. 1 once daily 4)  Amlodipine Besylate 5 Mg Tabs (Amlodipine besylate) .Marland Kitchen.. 1 once daily  Other Orders: Venipuncture (02542) Specimen Handling (70623)  Patient Instructions: 1)  We will avoid any diuretics in the future. Stay on current meds.  2)  Please schedule a follow-up appointment in 3 months .  Prescriptions: AMLODIPINE BESYLATE 5 MG TABS (AMLODIPINE BESYLATE) 1 once daily  #90 x 3   Entered and Authorized by:   Nelwyn Salisbury MD   Signed by:   Nelwyn Salisbury MD on 07/12/2010   Method used:   [facility] to        [facility]  [id number]       [address]       [place], [place]  [address]       Ph: [phone] or [phone]       Fax: [phone]   RxID:   [id number] LISINOPRIL 20 MG TABS (LISINOPRIL) 1 once daily  #90 x 3   Entered and Authorized by:   Nelwyn Salisbury MD   Signed by:   Nelwyn Salisbury MD  on 07/12/2010   Method used:   [facility]        [facility]  [id number]       [address]       [place], [place]  [address]       Ph: [phone] or [phone]       Fax: [phone]   RxID:   [id number]

## 2011-01-16 NOTE — Assessment & Plan Note (Signed)
Summary: CHEST TIGHTNESS SLOW PULSE THAT FADES AWAY/PS   Vital Signs:  Patient profile:   50 year old female Menstrual status:  regular Weight:      177 pounds Temp:     98.2 degrees F oral Pulse rate:   83 / minute BP sitting:   164 / 100  Vitals Entered By: Lynann Beaver CMA (March 17, 2010 9:22 AM) CC: ?Palpitations?  chest pain Is Patient Diabetic? No Pain Assessment Patient in pain? no        History of Present Illness: here for one week of intermittent chest pressures, pains down the left arm, nausea, belching, and lots of heartburn. Sometimes food stops partway down when she swallows. No SOB. This is not related to exercise. She gets some relief with Pepcid. She has been under a lot of stress lately. She says Lexapro has helped , but she may need a higher dose of it.   Current Medications (verified): 1)  Lisinopril 20 Mg  Tabs (Lisinopril) .Marland Kitchen.. 1 By Mouth Once Daily 2)  Hydrocodone-Acetaminophen 5-500 Mg Tabs (Hydrocodone-Acetaminophen) .Marland Kitchen.. 1 By Mouth Once Daily 3)  Lexapro 10 Mg Tabs (Escitalopram Oxalate) .... Once Daily  Allergies (verified): No Known Drug Allergies  Past History:  Past Medical History: Depression Hypertension UTIs Genital Warts Heart Arrhythmia Heart Murmur uterine fibroids, sees Dr. Dareen Piano Low back pain, has a herniated lumbar disc, sees Dr. August Saucer multiple allergies, sees Dr. Stevphen Rochester  Past Surgical History: Reviewed history from 12/26/2009 and no changes required. Tubal ligation laser ablation of uterine fibroids, per Dr. Dareen Piano in 2000  Review of Systems  The patient denies anorexia, fever, weight loss, weight gain, vision loss, decreased hearing, hoarseness, syncope, dyspnea on exertion, peripheral edema, prolonged cough, headaches, hemoptysis, abdominal pain, melena, hematochezia, hematuria, incontinence, genital sores, muscle weakness, suspicious skin lesions, transient blindness, difficulty walking, depression, unusual  weight change, abnormal bleeding, enlarged lymph nodes, angioedema, breast masses, and testicular masses.    Physical Exam  General:  Well-developed,well-nourished,in no acute distress; alert,appropriate and cooperative throughout examination Neck:  No deformities, masses, or tenderness noted. Chest Wall:  No deformities, masses, or tenderness noted. Lungs:  Normal respiratory effort, chest expands symmetrically. Lungs are clear to auscultation, no crackles or wheezes. Heart:  Normal rate and regular rhythm. S1 and S2 normal without gallop, murmur, click, rub or other extra sounds. EKG normal Abdomen:  Bowel sounds positive,abdomen soft and non-tender without masses, organomegaly or hernias noted. Psych:  Cognition and judgment appear intact. Alert and cooperative with normal attention span and concentration. No apparent delusions, illusions, hallucinations   Impression & Recommendations:  Problem # 1:  GERD (ICD-530.81)  Her updated medication list for this problem includes:    Omeprazole 40 Mg Cpdr (Omeprazole) ..... Once daily  Problem # 2:  CHEST PAIN (ICD-786.50)  Orders: EKG w/ Interpretation (93000)  Problem # 3:  DEPRESSION, MODERATE, RECURRENT (ICD-296.32)  Complete Medication List: 1)  Lisinopril 20 Mg Tabs (Lisinopril) .Marland Kitchen.. 1 by mouth once daily 2)  Hydrocodone-acetaminophen 5-500 Mg Tabs (Hydrocodone-acetaminophen) .Marland Kitchen.. 1 by mouth once daily 3)  Allergy Shots  4)  Lexapro 20 Mg Tabs (Escitalopram oxalate) .... Once daily 5)  Omeprazole 40 Mg Cpdr (Omeprazole) .... Once daily  Patient Instructions: 1)  Increase Lexapro to 20 mg a day. Start on Omeprazole 40 mg daily.  2)  Please schedule a follow-up appointment in 1 year.  Prescriptions: OMEPRAZOLE 40 MG CPDR (OMEPRAZOLE) once daily  #30 x 11   Entered and Authorized  by:   Nelwyn Salisbury MD   Signed by:   Nelwyn Salisbury MD on 03/17/2010   Method used:   [facility]        [id number]        [address]       [place], [place]  [address]       Ph: [phone] or [phone]       Fax: [phone]   RxID:   [id number] LEXAPRO 20 MG TABS (ESCITALOPRAM OXALATE) once daily  #30 x 11   Entered and Authorized by:   Nelwyn Salisbury MD   Signed by:   Nelwyn Salisbury MD on 03/17/2010   Method used:   [facility]        [facility] [id number]       [address]       [place], [place]  [address]       Ph: [phone] or [phone]       Fax: [phone]   RxID:   [id number]

## 2011-01-16 NOTE — Progress Notes (Signed)
Summary: STD testing  Phone Note Call from Patient   Summary of Call: Pt wants results of STD testing in January 2011. [phone] Initial call taken by: Lynann Beaver CMA,  July 03, 2010 1:56 PM  Follow-up for Phone Call        Phone Call Completed Follow-up by: Raechel Ache, RN,  July 03, 2010 2:59 PM

## 2011-01-16 NOTE — Letter (Signed)
Summary: [facility]  [facility]   Imported By: Maryln Gottron 03/31/2010 15:43:07  _____________________________________________________________________  External Attachment:    Type:   Image     Comment:   External Document

## 2011-01-16 NOTE — Op Note (Signed)
Summary: Epidural Steroid Injection/[facility]  Epidural Steroid Injection/[facility]   Imported By: Maryln Gottron 01/20/2010 13:25:34  _____________________________________________________________________  External Attachment:    Type:   Image     Comment:   External Document

## 2011-01-16 NOTE — Letter (Signed)
Summary: [facility]   Imported By: Maryln Gottron 01/20/2010 13:29:02  _____________________________________________________________________  External Attachment:    Type:   Image     Comment:   External Document

## 2011-01-16 NOTE — Progress Notes (Signed)
Summary: lab results  Phone Note Call from Patient   Caller: Patient Call For: Nelwyn Salisbury MD Summary of Call: Pt. would like lab results. [phone] Initial call taken by: Lynann Beaver CMA,  December 21, 2009 4:08 PM  Follow-up for Phone Call        see results in chart Follow-up by: Nelwyn Salisbury MD,  December 21, 2009 6:00 PM  Additional Follow-up for Phone Call Additional follow up Details #1::        pt aware Additional Follow-up by: Alfred Levins, CMA,  December 22, 2009 8:45 AM

## 2011-01-16 NOTE — Progress Notes (Signed)
Summary: chest tightness now more in episodes  Phone Note Call from Patient   Summary of Call: Chest tightness today.  Has been having episodes of cough, chest tightness, chest pain, left arm numbness.  Prefers to see Dr. Clent Ridges.  Please advise.   Initial call taken by: Rudy Jew, RN,  March 16, 2010 9:25 AM  Follow-up for Phone Call        OV tomorrow &  ER as needed change or worsening of symptoms per Dr. Clent Ridges. Follow-up by: Rudy Jew, RN,  March 16, 2010 9:57 AM

## 2011-01-16 NOTE — Letter (Signed)
Summary: Results Follow-up Letter  [place] [facility]  [address] [place], [place] [address]   Phone: [phone]  Fax: [phone]    12/28/2009  [address] [place], [place]  [address]  Dear Ms. BLASING,   The following are the results of your recent test(s):  Test     Result     Pap Smear    Normal___X____  Not Normal_____       Comments: _________________________________________________________ Cholesterol LDL(Bad cholesterol):          Your goal is less than:         HDL (Good cholesterol):        Your goal is more than: _________________________________________________________ Other Tests:   _________________________________________________________  Please call for an appointment Or ________Repeat in one year_______________________________ _________________________________________________________ _________________________________________________________  Sincerely,  Gershon Crane, MD [place] [facility]

## 2011-01-16 NOTE — Assessment & Plan Note (Signed)
Summary: FATIGUE, NOT FEELING WELL // RS   Vital Signs:  Patient profile:   50 year old female Menstrual status:  regular Weight:      184 pounds BMI:     31.70 BP sitting:   120 / 92  (left arm) Cuff size:   regular  Vitals Entered By: Raechel Ache, RN (June 28, 2010 1:29 PM) CC: ROV, c/o high BP, taking 2 Lisinopril daily   History of Present Illness: here to follow up on HTN and some other issues. At home her BP has risen to 160s over 100s in the past 6 months. She feels fine in general, and in fact has recently enrolled in exercise classes to lose weight. She increased her Lisinopril to 40 mg a day 2 weeks ago, but her BP has not responded. She also has had an odorless brown vaginal DC for one week.   Allergies (verified): No Known Drug Allergies  Past History:  Past Medical History: Reviewed history from 03/17/2010 and no changes required. Depression Hypertension UTIs Genital Warts Heart Arrhythmia Heart Murmur uterine fibroids, sees Dr. Dareen Piano Low back pain, has a herniated lumbar disc, sees Dr. August Saucer multiple allergies, sees Dr. Stevphen Rochester  Review of Systems  The patient denies anorexia, fever, weight loss, weight gain, vision loss, decreased hearing, hoarseness, chest pain, syncope, dyspnea on exertion, peripheral edema, prolonged cough, headaches, hemoptysis, abdominal pain, melena, hematochezia, severe indigestion/heartburn, hematuria, incontinence, genital sores, muscle weakness, suspicious skin lesions, transient blindness, difficulty walking, depression, unusual weight change, abnormal bleeding, enlarged lymph nodes, angioedema, breast masses, and testicular masses.    Physical Exam  General:  overweight-appearing.   Head:  Normocephalic and atraumatic without obvious abnormalities. No apparent alopecia or balding. Eyes:  No corneal or conjunctival inflammation noted. EOMI. Perrla. Funduscopic exam benign, without hemorrhages, exudates or papilledema.  Vision grossly normal. Ears:  External ear exam shows no significant lesions or deformities.  Otoscopic examination reveals clear canals, tympanic membranes are intact bilaterally without bulging, retraction, inflammation or discharge. Hearing is grossly normal bilaterally. Nose:  External nasal examination shows no deformity or inflammation. Nasal mucosa are pink and moist without lesions or exudates. Mouth:  Oral mucosa and oropharynx without lesions or exudates.  Teeth in good repair. Neck:  No deformities, masses, or tenderness noted. Lungs:  Normal respiratory effort, chest expands symmetrically. Lungs are clear to auscultation, no crackles or wheezes. Extremities:  no edema   Impression & Recommendations:  Problem # 1:  HYPERTENSION (ICD-401.9)  The following medications were removed from the medication list:    Lisinopril 20 Mg Tabs (Lisinopril) .Marland Kitchen... 1 by mouth once daily Her updated medication list for this problem includes:    Losartan Potassium-hctz 100-25 Mg Tabs (Losartan potassium-hctz) ..... Once daily  Problem # 2:  VAGINITIS, BACTERIAL (ICD-616.10)  Her updated medication list for this problem includes:    Metronidazole 500 Mg Tabs (Metronidazole) .Marland Kitchen..Marland Kitchen Two times a day  Complete Medication List: 1)  Hydrocodone-acetaminophen 5-500 Mg Tabs (Hydrocodone-acetaminophen) .Marland Kitchen.. 1 by mouth once daily 2)  Allergy Shots  3)  Lexapro 20 Mg Tabs (Escitalopram oxalate) .... Once daily 4)  Losartan Potassium-hctz 100-25 Mg Tabs (Losartan potassium-hctz) .... Once daily 5)  Metronidazole 500 Mg Tabs (Metronidazole) .... Two times a day  Patient Instructions: 1)  Switch from lisinopril to Losartan HCT. Use Flagyl.  2)  Please schedule a follow-up appointment in 1 month.  Prescriptions: METRONIDAZOLE 500 MG TABS (METRONIDAZOLE) two times a day  #10 x 0  Entered and Authorized by:   Nelwyn Salisbury MD   Signed by:   Nelwyn Salisbury MD on 06/28/2010   Method used:   [facility]  [address] [id number]       [address]       [place], [place]  [address]       Ph: [phone] or [phone]       Fax: [phone]   RxID:   [id number] LOSARTAN POTASSIUM-HCTZ 100-25 MG TABS (LOSARTAN POTASSIUM-HCTZ) once daily  #30 x 11   Entered and Authorized by:   Nelwyn Salisbury MD   Signed by:   Nelwyn Salisbury MD on 06/28/2010   Method used:   [facility]        [facility]  [address] [id number]       [address]       [place], [place]  [address]       Ph: [phone] or [phone]       Fax: [phone]   RxID:   [id number]

## 2011-01-16 NOTE — Letter (Signed)
Summary: [facility]   Imported By: Sherian Rein 01/09/2010 12:38:17  _____________________________________________________________________  External Attachment:    Type:   Image     Comment:   External Document

## 2011-01-16 NOTE — Assessment & Plan Note (Signed)
Summary: CPX (w/ pap)//SLM   Vital Signs:  Patient profile:   50 year old female Menstrual status:  regular LMP:     11/25/2009 Height:      64 inches Weight:      190 pounds Temp:     98.1 degrees F oral Pulse rate:   73 / minute BP sitting:   124 / 84  (left arm) Cuff size:   large  Vitals Entered By: Alfred Levins, CMA (December 26, 2009 9:20 AM) CC: cpx with pap LMP (date): 11/25/2009     Menstrual Status regular Enter LMP: 11/25/2009   History of Present Illness: 50 yr old female for cpx. She has a couple of issues right now. First she has been having sciatica pain down the left leg, and she has been seeing Dr. Dorene Grebe about this. She had an MRI scan last week but has not heard results yet. Second she thinks she has another vaginal infection. For the past week she has had cramps and a brown vaginal DC which has a strong odor. She used to see Dr. Dareen Piano for GYN care, and he has given her many courses of Flagyl for this over the years. Her menses are still regular, but she has had a lot of hot flashes this past year.   Current Medications (verified): 1)  Lisinopril 20 Mg  Tabs (Lisinopril) .Marland Kitchen.. 1 By Mouth Once Daily 2)  Meloxicam 7.5 Mg Tabs (Meloxicam) .... As Needed  Allergies (verified): No Known Drug Allergies  Past History:  Past Medical History: Reviewed history from 01/12/2008 and no changes required. Depression Hypertension UTIs Genital Warts Heart Arrhythmia Heart Murmur uterine fibroids, sees Dr. Dareen Piano  Past Surgical History: Tubal ligation laser ablation of uterine fibroids, per Dr. Dareen Piano in 2000  Family History: Reviewed history from 09/22/2007 and no changes required. Family History of Arthritis Family History Diabetes 1st degree relative Family History High cholesterol Family History Hypertension Family History of Prostate CA 1st degree relative <50 Family History of Stroke M 1st degree relative <50  Social History: Reviewed  history from 09/22/2007 and no changes required. Retired Married Former Smoker Alcohol use-no Drug use-no Regular exercise-yes  Review of Systems  The patient denies anorexia, fever, weight loss, weight gain, vision loss, decreased hearing, hoarseness, chest pain, syncope, dyspnea on exertion, peripheral edema, prolonged cough, headaches, hemoptysis, abdominal pain, melena, hematochezia, severe indigestion/heartburn, hematuria, incontinence, genital sores, muscle weakness, suspicious skin lesions, transient blindness, difficulty walking, depression, unusual weight change, abnormal bleeding, enlarged lymph nodes, angioedema, breast masses, and testicular masses.    Physical Exam  General:  overweight-appearing.   Head:  Normocephalic and atraumatic without obvious abnormalities. No apparent alopecia or balding. Eyes:  No corneal or conjunctival inflammation noted. EOMI. Perrla. Funduscopic exam benign, without hemorrhages, exudates or papilledema. Vision grossly normal. Ears:  External ear exam shows no significant lesions or deformities.  Otoscopic examination reveals clear canals, tympanic membranes are intact bilaterally without bulging, retraction, inflammation or discharge. Hearing is grossly normal bilaterally. Nose:  External nasal examination shows no deformity or inflammation. Nasal mucosa are pink and moist without lesions or exudates. Mouth:  Oral mucosa and oropharynx without lesions or exudates.  Teeth in good repair. Neck:  No deformities, masses, or tenderness noted. Chest Wall:  No deformities, masses, or tenderness noted. Breasts:  No mass, nodules, thickening, tenderness, bulging, retraction, inflamation, nipple discharge or skin changes noted.   Lungs:  Normal respiratory effort, chest expands symmetrically. Lungs are clear to auscultation, no crackles  or wheezes. Heart:  Normal rate and regular rhythm. S1 and S2 normal without gallop, murmur, click, rub or other extra  sounds. Abdomen:  Bowel sounds positive,abdomen soft and non-tender without masses, organomegaly or hernias noted. Genitalia:  normal introitus, no external lesions, no vaginal or cervical lesions, no vaginal atrophy, and no friaility or hemorrhage.  There is a strong odor present. There is a lot of whitish DC  present in the vagina. A Pap smear was obtained. The uterus is slightly enlarged consistent with small fibroids, but it is not tender. Adnexae reveal no tenderness or masses.  Msk:  No deformity or scoliosis noted of thoracic or lumbar spine.   Pulses:  R and L carotid,radial,femoral,dorsalis pedis and posterior tibial pulses are full and equal bilaterally Extremities:  No clubbing, cyanosis, edema, or deformity noted with normal full range of motion of all joints.   Neurologic:  No cranial nerve deficits noted. Station and gait are normal. Plantar reflexes are down-going bilaterally. DTRs are symmetrical throughout. Sensory, motor and coordinative functions appear intact. Skin:  Intact without suspicious lesions or rashes Cervical Nodes:  No lymphadenopathy noted Axillary Nodes:  No palpable lymphadenopathy Inguinal Nodes:  No significant adenopathy Psych:  Cognition and judgment appear intact. Alert and cooperative with normal attention span and concentration. No apparent delusions, illusions, hallucinations   Impression & Recommendations:  Problem # 1:  HEALTH MAINTENANCE EXAM (ICD-V70.0)  Complete Medication List: 1)  Lisinopril 20 Mg Tabs (Lisinopril) .Marland Kitchen.. 1 by mouth once daily 2)  Meloxicam 7.5 Mg Tabs (Meloxicam) .... As needed 3)  Metronidazole 500 Mg Tabs (Metronidazole) .... Two times a day  Other Orders: Nutrition Referral (Nutrition)  Patient Instructions: 1)  It is important that you exercise reguarly at least 20 minutes 5 times a week. If you develop chest pain, have severe difficulty breathing, or feel very tired, stop exercising immediately and seek medical  attention.  2)  You need to lose weight. Consider a lower calorie diet and regular exercise.  3)  Schedule your mammogram.  4)  Treat the BV with Flagyl. 5)  Try black cohosh for hot flashes.  6)  Will refer her to Nutrition about her prediabetes.  Prescriptions: METRONIDAZOLE 500 MG TABS (METRONIDAZOLE) two times a day  #20 x 0   Entered and Authorized by:   Nelwyn Salisbury MD   Signed by:   Nelwyn Salisbury MD on 12/26/2009   Method used:   Electronically to        [facility] [address] (retail)       [phone]       [place], [place]  [address]       Ph: [phone]       Fax: [phone]   RxID:   [id number]

## 2011-01-16 NOTE — Progress Notes (Signed)
Summary: elev BP  Phone Note Call from Patient   Summary of Call: BP 140-160/80-90 consistently.  Very busy now with school & scheduling.  Will call back & schedule ov Dr. Clent Ridges ASAP.     Initial call taken by: Rudy Jew, RN,  June 14, 2010 1:22 PM  Follow-up for Phone Call        noted Follow-up by: Nelwyn Salisbury MD,  June 14, 2010 4:15 PM

## 2011-01-30 ENCOUNTER — Encounter: Payer: Self-pay | Admitting: Family Medicine

## 2011-01-30 ENCOUNTER — Ambulatory Visit (INDEPENDENT_AMBULATORY_CARE_PROVIDER_SITE_OTHER): Payer: 59 | Admitting: Family Medicine

## 2011-01-30 DIAGNOSIS — I10 Essential (primary) hypertension: Secondary | ICD-10-CM

## 2011-01-30 DIAGNOSIS — M549 Dorsalgia, unspecified: Secondary | ICD-10-CM

## 2011-01-30 DIAGNOSIS — K219 Gastro-esophageal reflux disease without esophagitis: Secondary | ICD-10-CM

## 2011-01-30 MED ORDER — AMLODIPINE BESYLATE 10 MG PO TABS: 10.0000 mg | ORAL_TABLET | Freq: Every day | ORAL | Status: DC

## 2011-01-30 MED ORDER — OMEPRAZOLE MAGNESIUM 20 MG PO TBEC: 20.0000 mg | DELAYED_RELEASE_TABLET | Freq: Every day | ORAL | Status: DC

## 2011-01-30 MED ORDER — HYDROCODONE-ACETAMINOPHEN 10-660 MG PO TABS: 10.0000 mg | ORAL_TABLET | Freq: Four times a day (QID) | ORAL | Status: DC | PRN

## 2011-01-30 NOTE — Progress Notes (Signed)
Subjective:    Patient ID: [name], female    DOB: 09/21/1961, 50 y.o.   MRN: [id number]  HPI Here for several issues. First her BP has been running a little high lately. She still watches her diet and exercises 5-6 days a week. Also she has had several days of burning in the chest with a sensation of scratching in her esophagus as she swallows food. No pain or SOB or nausea.    Review of Systems  Constitutional: Negative.   Respiratory: Negative.   Cardiovascular: Positive for chest pain. Negative for palpitations.  Gastrointestinal: Negative.        Objective:   Physical Exam  Constitutional: She appears well-developed and well-nourished.  Neck: Neck supple. No thyromegaly present.  Cardiovascular: Normal rate, regular rhythm, normal heart sounds and intact distal pulses.  Exam reveals no gallop and no friction rub.   No murmur heard. Pulmonary/Chest: Effort normal and breath sounds normal. No respiratory distress. She has no wheezes. She has no rales. She exhibits no tenderness.  Abdominal: Soft. Bowel sounds are normal. She exhibits no distension and no mass. There is no tenderness. There is no rebound and no guarding.  Lymphadenopathy:    She has no cervical adenopathy.          Assessment & Plan:  We will increase her Amlodipine to 10 mg a day. Try Prilosec OTC for the reflux.

## 2011-01-31 ENCOUNTER — Other Ambulatory Visit (INDEPENDENT_AMBULATORY_CARE_PROVIDER_SITE_OTHER): Payer: 59 | Admitting: Family Medicine

## 2011-01-31 ENCOUNTER — Other Ambulatory Visit: Payer: Self-pay | Admitting: Family Medicine

## 2011-01-31 DIAGNOSIS — Z0000 Encounter for general adult medical examination without abnormal findings: Secondary | ICD-10-CM

## 2011-01-31 LAB — TSH: TSH: 1.23 u[IU]/mL (ref 0.35–5.50)

## 2011-01-31 LAB — BASIC METABOLIC PANEL
BUN: 11 mg/dL (ref 6–23)
Chloride: 107 mEq/L (ref 96–112)
Creatinine, Ser: 0.9 mg/dL (ref 0.4–1.2)
GFR: 91.81 mL/min (ref >60.00)
Potassium: 4.4 mEq/L (ref 3.5–5.1)

## 2011-01-31 LAB — HEPATIC FUNCTION PANEL
Bilirubin, Direct: 0.1 mg/dL (ref 0.0–0.3)
Total Bilirubin: 0.5 mg/dL (ref 0.3–1.2)

## 2011-01-31 LAB — LIPID PANEL
LDL Cholesterol: 129 mg/dL — ABNORMAL HIGH (ref 0–99)
Total CHOL/HDL Ratio: 4

## 2011-01-31 LAB — POCT URINALYSIS DIPSTICK
Ketones, UA: NEGATIVE
Leukocytes, UA: NEGATIVE
Nitrite, UA: NEGATIVE
Protein, UA: NEGATIVE
Urobilinogen, UA: 1
pH, UA: 7

## 2011-02-01 ENCOUNTER — Telehealth: Payer: Self-pay

## 2011-02-01 LAB — CBC WITH DIFFERENTIAL/PLATELET
Basophils Relative: 0.3 % (ref 0.0–3.0)
Eosinophils Absolute: 0.2 10*3/uL (ref 0.0–0.7)
Eosinophils Relative: 3.1 % (ref 0.0–5.0)
HCT: 39.5 % (ref 36.0–46.0)
Lymphs Abs: 2.1 10*3/uL (ref 0.7–4.0)
MCHC: 33.5 g/dL (ref 30.0–36.0)
MCV: 90.8 fl (ref 78.0–100.0)
Monocytes Absolute: 0.8 10*3/uL (ref 0.1–1.0)
Neutro Abs: 4.4 10*3/uL (ref 1.4–7.7)
RBC: 4.35 Mil/uL (ref 3.87–5.11)
WBC: 7.4 10*3/uL (ref 4.5–10.5)

## 2011-02-01 LAB — HIV ANTIBODY (ROUTINE TESTING W REFLEX): HIV: NONREACTIVE

## 2011-02-01 LAB — GC/CHLAMYDIA PROBE AMP, URINE: GC Probe Amp, Urine: NEGATIVE

## 2011-02-01 NOTE — Telephone Encounter (Signed)
Pt notified of test results

## 2011-02-01 NOTE — Telephone Encounter (Signed)
Message copied by Madison Hickman on Thu Feb 01, 2011 10:14 AM ------      Message from: Dwaine Deter      Created: Thu Feb 01, 2011  9:35 AM       Negative for STDs

## 2011-02-02 ENCOUNTER — Telehealth: Payer: Self-pay

## 2011-02-02 NOTE — Telephone Encounter (Signed)
Pt informed of lab results stated eats a lot fresh fruit. Informed that fresh fruits do have natural sugars in them and maybe cut back on those.c

## 2011-02-02 NOTE — Telephone Encounter (Signed)
Message copied by Madison Hickman on Fri Feb 02, 2011 11:12 AM ------      Message from: Dwaine Deter      Created: Fri Feb 02, 2011 10:51 AM       Normal except mildly high glucose and chol. Watch the diet

## 2011-02-07 ENCOUNTER — Ambulatory Visit (INDEPENDENT_AMBULATORY_CARE_PROVIDER_SITE_OTHER): Payer: 59 | Admitting: Family Medicine

## 2011-02-07 ENCOUNTER — Encounter: Payer: Self-pay | Admitting: Family Medicine

## 2011-02-07 VITALS — BP 130/84 | HR 81 | Ht 64.0 in | Wt 182.0 lb

## 2011-02-07 DIAGNOSIS — Z0000 Encounter for general adult medical examination without abnormal findings: Secondary | ICD-10-CM

## 2011-02-07 NOTE — Progress Notes (Signed)
Subjective:    Patient ID: [name], female    DOB: [birth year], 50 y.o.   MRN: [id number]  HPI 50 yr ole female for a cpx. She feels well and has no complaints. Several weeks ago we increased the dose of her Amlodipine for some elevated Bps, and they have come down nicely.    Review of Systems  Constitutional: Negative.  Negative for fever, diaphoresis, activity change, appetite change, fatigue and unexpected weight change.  HENT: Negative.  Negative for hearing loss, ear pain, nosebleeds, congestion, sore throat, trouble swallowing, neck pain, neck stiffness, voice change and tinnitus.   Eyes: Negative.  Negative for photophobia, pain, discharge, redness and visual disturbance.  Respiratory: Negative.  Negative for apnea, cough, choking, chest tightness, shortness of breath, wheezing and stridor.   Cardiovascular: Negative.  Negative for chest pain, palpitations and leg swelling.  Gastrointestinal: Negative.  Negative for nausea, vomiting, abdominal pain, diarrhea, constipation, blood in stool, abdominal distention and rectal pain.  Genitourinary: Negative.  Negative for dysuria, urgency, frequency, hematuria, flank pain, scrotal swelling, vaginal bleeding, vaginal discharge, enuresis, difficulty urinating, testicular pain, vaginal pain and menstrual problem.  Musculoskeletal: Negative.  Negative for myalgias, back pain, joint swelling, arthralgias and gait problem.  Skin: Negative.  Negative for color change, pallor, rash and wound.  Neurological: Negative.  Negative for dizziness, tremors, seizures, syncope, speech difficulty, weakness, light-headedness, numbness and headaches.  Hematological: Negative.  Negative for adenopathy. Does not bruise/bleed easily.  Psychiatric/Behavioral: Negative.  Negative for hallucinations, behavioral problems, confusion, sleep disturbance, dysphoric mood and agitation. The patient is not nervous/anxious.        Objective:   Physical Exam    Constitutional: She appears well-developed and well-nourished. No distress.  HENT:  Head: Normocephalic and atraumatic.  Right Ear: External ear normal.  Left Ear: External ear normal.  Nose: Nose normal.  Mouth/Throat: Oropharynx is clear and moist. No oropharyngeal exudate.  Eyes: Conjunctivae and EOM are normal. Pupils are equal, round, and reactive to light. Right eye exhibits no discharge. Left eye exhibits no discharge. No scleral icterus.  Neck: Normal range of motion. Neck supple. No JVD present. No thyromegaly present.  Cardiovascular: Normal rate, regular rhythm, normal heart sounds and intact distal pulses.  Exam reveals no gallop and no friction rub.   No murmur heard. Pulmonary/Chest: Effort normal and breath sounds normal. No stridor. No respiratory distress. She has no wheezes. She has no rales. She exhibits no tenderness.  Abdominal: Soft. Normal appearance and bowel sounds are normal. She exhibits no distension, no abdominal bruit, no ascites and no mass. There is no hepatosplenomegaly. There is no tenderness. There is no rigidity, no rebound and no guarding. No hernia.  Musculoskeletal: Normal range of motion. She exhibits no edema and no tenderness.  Lymphadenopathy:    She has no cervical adenopathy.  Neurological: She is alert. She has normal reflexes. No cranial nerve deficit. She exhibits normal muscle tone. Coordination normal.  Skin: Skin is warm and dry. No rash noted. She is not diaphoretic. No erythema. No pallor.  Psychiatric: She has a normal mood and affect. Her behavior is normal. Judgment and thought content normal.          Assessment & Plan:  She is doing well. Continue diet and exercise

## 2011-02-09 ENCOUNTER — Encounter: Payer: Self-pay | Admitting: Family Medicine

## 2011-02-13 ENCOUNTER — Telehealth: Payer: Self-pay | Admitting: *Deleted

## 2011-02-13 NOTE — Telephone Encounter (Signed)
BP is running 107/73 without BP meds, and BS was 100 after eating.  Appt scheduled tomorrow with Dr. Clent Ridges.  States she has lost 10 lbs this week.

## 2011-02-13 NOTE — Telephone Encounter (Signed)
Noted  

## 2011-02-14 ENCOUNTER — Ambulatory Visit (INDEPENDENT_AMBULATORY_CARE_PROVIDER_SITE_OTHER): Payer: 59 | Admitting: Family Medicine

## 2011-02-14 VITALS — BP 136/88 | HR 77 | Temp 97.8°F | Wt 174.0 lb

## 2011-02-14 DIAGNOSIS — I10 Essential (primary) hypertension: Secondary | ICD-10-CM

## 2011-02-15 ENCOUNTER — Encounter: Payer: Self-pay | Admitting: Family Medicine

## 2011-02-15 NOTE — Progress Notes (Signed)
Subjective:    Patient ID: [name], female    DOB: 26-Nov-1961, 50 y.o.   MRN: [id number]  HPI Here with some concerns about her BP. She feels fine, but her BP has often been in the range of 110-120 over 60-70 recently and it has never been this low before.    Review of Systems  Constitutional: Negative.   Respiratory: Negative.   Cardiovascular: Negative.        Objective:   Physical Exam  Constitutional: She appears well-developed and well-nourished.  Cardiovascular: Normal rate, regular rhythm, normal heart sounds and intact distal pulses.   Pulmonary/Chest: Effort normal and breath sounds normal.          Assessment & Plan:  Her HTN is well controlled, and I reassured her that this is exactly what we want to see.

## 2011-02-27 LAB — URINALYSIS, ROUTINE W REFLEX MICROSCOPIC
Bilirubin Urine: NEGATIVE
Nitrite: NEGATIVE
Specific Gravity, Urine: 1.012 (ref 1.005–1.030)
pH: 7 (ref 5.0–8.0)

## 2011-02-27 LAB — DIFFERENTIAL
Eosinophils Absolute: 0.2 10*3/uL (ref 0.0–0.7)
Eosinophils Relative: 2 % (ref 0–5)
Lymphs Abs: 3.8 10*3/uL (ref 0.7–4.0)
Monocytes Relative: 8 % (ref 3–12)

## 2011-02-27 LAB — COMPREHENSIVE METABOLIC PANEL
ALT: 18 U/L (ref 0–35)
AST: 17 U/L (ref 0–37)
Alkaline Phosphatase: 83 U/L (ref 39–117)
CO2: 28 mEq/L (ref 19–32)
Calcium: 9 mg/dL (ref 8.4–10.5)
GFR calc Af Amer: >60 mL/min (ref >60)
Potassium: 3.5 mEq/L (ref 3.5–5.1)
Sodium: 142 mEq/L (ref 135–145)
Total Protein: 6.3 g/dL (ref 6.0–8.3)

## 2011-02-27 LAB — POCT CARDIAC MARKERS
CKMB, poc: <1 ng/mL — ABNORMAL LOW (ref 1.0–8.0)
Myoglobin, poc: 36.3 ng/mL (ref 12–200)
Troponin i, poc: <0.05 ng/mL (ref 0.00–0.09)

## 2011-02-27 LAB — CBC
Hemoglobin: 12.7 g/dL (ref 12.0–15.0)
MCHC: 33.9 g/dL (ref 30.0–36.0)
RDW: 13.9 % (ref 11.5–15.5)
WBC: 11.2 10*3/uL — ABNORMAL HIGH (ref 4.0–10.5)

## 2011-02-27 LAB — URINE MICROSCOPIC-ADD ON

## 2011-02-27 LAB — POCT PREGNANCY, URINE: Preg Test, Ur: NEGATIVE

## 2011-03-03 LAB — COMPREHENSIVE METABOLIC PANEL
AST: 15 U/L (ref 0–37)
Albumin: 3.5 g/dL (ref 3.5–5.2)
Calcium: 8.8 mg/dL (ref 8.4–10.5)
Creatinine, Ser: 0.68 mg/dL (ref 0.4–1.2)
GFR calc Af Amer: >60 mL/min (ref >60)
GFR calc non Af Amer: >60 mL/min (ref >60)
Total Protein: 6 g/dL (ref 6.0–8.3)

## 2011-03-03 LAB — CARDIAC PANEL(CRET KIN+CKTOT+MB+TROPI)
CK, MB: 0.6 ng/mL (ref 0.3–4.0)
Relative Index: INVALID (ref 0.0–2.5)
Total CK: 62 U/L (ref 7–177)
Troponin I: <0.01 ng/mL (ref 0.00–0.06)

## 2011-03-04 LAB — URINALYSIS, ROUTINE W REFLEX MICROSCOPIC
Ketones, ur: NEGATIVE mg/dL
Leukocytes, UA: NEGATIVE
Nitrite: NEGATIVE
Protein, ur: NEGATIVE mg/dL
pH: 6 (ref 5.0–8.0)

## 2011-03-04 LAB — POCT TOXICOLOGY PANEL

## 2011-03-04 LAB — CARDIAC PANEL(CRET KIN+CKTOT+MB+TROPI)
CK, MB: 0.6 ng/mL (ref 0.3–4.0)
Total CK: 74 U/L (ref 7–177)
Total CK: 74 U/L (ref 7–177)

## 2011-03-04 LAB — POCT CARDIAC MARKERS
CKMB, poc: <1 ng/mL — ABNORMAL LOW (ref 1.0–8.0)
Troponin i, poc: <0.05 ng/mL (ref 0.00–0.09)

## 2011-03-04 LAB — DIFFERENTIAL
Basophils Absolute: 0 10*3/uL (ref 0.0–0.1)
Eosinophils Absolute: 0.2 10*3/uL (ref 0.0–0.7)
Eosinophils Relative: 2 % (ref 0–5)

## 2011-03-04 LAB — URINE MICROSCOPIC-ADD ON

## 2011-03-04 LAB — BASIC METABOLIC PANEL
BUN: 12 mg/dL (ref 6–23)
CO2: 30 mEq/L (ref 19–32)
Chloride: 106 mEq/L (ref 96–112)
Creatinine, Ser: 0.8 mg/dL (ref 0.4–1.2)
Glucose, Bld: 103 mg/dL — ABNORMAL HIGH (ref 70–99)

## 2011-03-04 LAB — CBC
MCH: 30.2 pg (ref 26.0–34.0)
MCHC: 33.1 g/dL (ref 30.0–36.0)
MCV: 91.3 fL (ref 78.0–100.0)
Platelets: 340 10*3/uL (ref 150–400)
RDW: 13.3 % (ref 11.5–15.5)

## 2011-03-04 LAB — APTT: aPTT: 33 seconds (ref 24–37)

## 2011-03-04 LAB — PREGNANCY, URINE: Preg Test, Ur: NEGATIVE

## 2011-03-09 LAB — URINE MICROSCOPIC-ADD ON

## 2011-03-09 LAB — COMPREHENSIVE METABOLIC PANEL
Albumin: 3.8 g/dL (ref 3.5–5.2)
Alkaline Phosphatase: 91 U/L (ref 39–117)
BUN: 16 mg/dL (ref 6–23)
Calcium: 9.1 mg/dL (ref 8.4–10.5)
Potassium: 4.2 mEq/L (ref 3.5–5.1)
Sodium: 138 mEq/L (ref 135–145)
Total Protein: 6.1 g/dL (ref 6.0–8.3)

## 2011-03-09 LAB — URINALYSIS, ROUTINE W REFLEX MICROSCOPIC
Glucose, UA: NEGATIVE mg/dL
Leukocytes, UA: NEGATIVE
Protein, ur: NEGATIVE mg/dL
Specific Gravity, Urine: 1.026 (ref 1.005–1.030)
pH: 5.5 (ref 5.0–8.0)

## 2011-03-09 LAB — CBC
HCT: 39.8 % (ref 36.0–46.0)
MCHC: 33.6 g/dL (ref 30.0–36.0)
Platelets: 321 10*3/uL (ref 150–400)
RDW: 14.3 % (ref 11.5–15.5)

## 2011-03-19 ENCOUNTER — Other Ambulatory Visit: Payer: Self-pay | Admitting: Family Medicine

## 2011-05-04 NOTE — H&P (Signed)
[place]  Patient:    [name], [name]                      MRN: [id number] Adm. Date:  [id number] Attending:  Meade Maw A CC:         Meade Maw, M.D.  Crista Luria, M.D.   History and Physical  CHIEF COMPLAINT: Chest pain, near syncope.  HISTORY OF PRESENT ILLNESS: The patient is a 50 year old woman who had a near syncopal episode this morning while in church.  She had a prodrome of light headedness and weakness with mild diaphoresis and then almost became unresponsive but not completely.  EMS was called and upon their arrival she was having mid chest and left arm discomfort, which resolved after sublingual nitroglycerin spray.  Vital signs were normal at the time.  She was brought to the emergency room, whereupon her chest discomfort had resolved.  After a period of approximately 1-1/2 hours of observation she redeveloped suddenly the onset of light headedness and weakness and her heart rate was noted to drop from 60 to 44 beats per minute, sinus bradycardia.  She had recurrence of her chest discomfort, which she continues to experience at this time, as well as left arm symptoms upon my evaluation 45 minutes later.  She describes the mid chest discomfort as something "stuck in my chest."  The arm discomfort is a heaviness.  She had a syncopal episode approximately two to three weeks ago while at work and was referred by Dr. Marny Lowenstein to Dr. Fraser Din, where evaluation has been unremarkable thus far including an echocardiogram.  Her cardiac risk factors really are only the presence of hypertension, treated only for the past several weeks.  PAST MEDICAL HISTORY:  1. Hypertension.  2. Fibroid tumors of the uterus, status post resection.  SOCIAL HISTORY: The patient is married and is accompanied by her husband in the emergency room this afternoon.  She has four children.  She works as a Teacher, early years/pre.  She denies the use of any  tobacco products.  No alcohol, no recreational drugs.  Caffeine not excessive.  CURRENT MEDICATIONS: Tiazac, unknown dosage.  ALLERGIES: No known drug allergies.  FAMILY HISTORY: Significant for diabetes, hypertension, and heart attack but not below the age of 42.  REVIEW OF SYSTEMS: Unremarkable except for symptoms of GERD and occasional dysphagia.  She has pyrosis frequently and the sensation of regurgitation beneath the sternum.  PHYSICAL EXAMINATION:  VITAL SIGNS: Blood pressure 130/70, pulse 60 and regular, respirations 16. She is afebrile.  GENERAL: This is a well-appearing, mildly obese 50 year old woman who appears comfortable but is complaining of chest discomfort.  HEENT: Unremarkable.  PERRLA.  EOMI.  Oral mucosa pink and moist.  Sclerae anicteric.  NECK: Supple without thyromegaly or masses.  Carotid upstrokes normal.  No bruits, no jugular venous distention.  CHEST: Clear with good excursion bilaterally.  HEART: Regular rhythm, normal S1 and S2, no S3, S4, murmur, click, or rub noted.  ABDOMEN: Soft, with mild right upper quadrant tenderness.  No rebound or guarding.  Bowel sounds present in all quadrants.  No hepatosplenomegaly.  RECTAL/GU: Examinations not performed.  EXTREMITIES: Full range of motion.  No edema.  Intact distal pulses.  NEUROLOGIC: Cranial nerves 2-12 intact.  Motor and sensory grossly intact. Gait not tested.  SKIN: Warm, dry, and clear.  LABORATORY DATA: Electrocardiogram shows normal sinus rhythm, normal EKG. Rhythm strips on the chart show sinus bradycardia with a rate  of 48.  Admission hemogram is normal.  Initial troponin and CK-MB are normal.  PT and PTT pending.  Serum electrolytes, BUN and creatinine are normal as well.  IMPRESSION:  1. Atypical angina, doubt coronary ischemia.  Not currently responding to     intravenous nitroglycerin, ? gastroesophageal reflux disease/esophageal     spasm.  2. Syncope with classic  vasovagal prodrome.  PLAN: Admit, observation needed for rule out myocardial infarction.  Continue IV nitroglycerin and aspirin 240 mg chewable.  Begin Proton pump inhibitor and provide GI cocktail.  Repeat CK-MB and troponin in six to eight hours as well as ECG in a.m.  Probable exercise stress testing by Dr. Fraser Din if the above are negative. DD:  03/23/01 TD:  03/24/01 Job: [id number]

## 2011-05-04 NOTE — Assessment & Plan Note (Signed)
[name]                            [facility] OFFICE NOTE   [name], [name]                      MRN:          [id number]  DATE:02/21/2007                            DOB:          [birth year]    This is a 50 year old woman, here to establish with our practice,  complaining of chest symptoms.  She has a long history of heartburn, but  over the past month, has had increasing problems with a sour, bitter  taste in her mouth, a sore throat, having to clear her throat, of more  frequent and worse heartburn and of belching.  There is no difficulty  swallowing.  Bowel movements are regular.  There has been no nausea.  Also over the past month, she has had episodes of fluttering in her  chest, where she feels her heart may be beating rapidly for brief  periods of time.  Sometimes this is enough to cause her a mild sensation  of shortness of breath, as well as a pressure-like sensation in her  chest.  Sometimes it may go down one arm or the other arm, but it has  never been severe.  She has not had any sweating.  Of note, this is much  more bothersome when she is perfectly still, such as when sitting,  watching television in the evenings or lying in bed at night.  During  the daytime, it seldom bothers her at all.  She does work out with a  Systems analyst at least five days a week and doing strenuous exercise  has no symptoms, whatsoever.  She went to the emergency room last night  with these symptoms.  Her workup at that point was completely negative.  Her exam was normal.  Her EKG was normal.  All lab work, including  cardiac enzymes, was also normal.  She was told to follow up with her  family doctor.  As far as the heartburn goes, she occasionally uses Tums  over-the-counter, but has taken nothing else for it.   OTHER PAST MEDICAL HISTORY:  Includes a long history of hypertension.  She seems to be well-controlled on her current medication.  She  had seen  Dr. Guerry Bruin for primary care before transferring to [place].  She  sees Dr. Malva Limes for gynecology care and keeps up with regular  examinations.  She has had two children and then had a tubal ligation in  1993.  She had a heart murmur when she was younger, but it apparently  went away.  She has been treated for genital warts.  She has been  treated in the past for depression, which is now doing quite well, off  medications.   ALLERGIES:  None.   CURRENT MEDICATIONS:  1. Lisinopril 20 mg per day.  2. Aspirin 81 mg per day.  3. Multivitamin daily.  4. Flax seed daily.   HABITS:  She does not use tobacco or alcohol.   SOCIAL HISTORY:  She is married with two children.  She is a Futures trader.   FAMILY HISTORY:  Positive for diabetes, hypertension, high cholesterol  and prostate cancer in her father.   OBJECTIVE:  Height 5 feet 4 inches, weight 190, BP 138/72, pulse 76 and  regular, temperature 98.1 degrees.  IN GENERAL:  She is in no distress.  She appears to be healthy, although  she is slightly overweight.  NECK:  Supple without lymphadenopathy or thyromegaly.  LUNGS:  Clear.  CARDIAC:  Rate and rhythm regular without gallops, murmurs or rubs.  Distal pulses are full.  EXTREMITIES:  No edema.   ASSESSMENT AND PLAN:  Chest symptoms, probably esophageal in nature.  I  think GE reflux disease is probably at the root of it all.  I gave her  samples for Prevacid 30 mg to take once every morning.  We will check  some additional labs today, including a TSH.  I asked to see her back in  two weeks for a followup visit.  At that time, if she is still  experiencing these symptoms, we may consider more of a cardiac workup,  to include a 24-hour Holter monitor and an echocardiogram.  Certainly,  at this point, the pattern of her symptoms does not support concerns for  ischemia, however.     Tera Mater. Clent Ridges, MD  Electronically Signed    SAF/MedQ  DD: 02/21/2007  DT:  02/21/2007  Job #: [id number]

## 2011-05-04 NOTE — Consult Note (Signed)
[place]  Patient:    [name], [name]                      MRN: [id number] Proc. Date: 03/23/01 Adm. Date:  [id number] Attending:  Meade Maw A CC:         Meade Maw, M.D.             Mabeline Caras, M.D., [facility]                          Consultation Report  DATE OF BIRTH:  [birth year]  REASON FOR CONSULTATION:  This is a 50 year old, right-handed, black, married female who was admitted on March 23, 2001, for evaluation of syncope, atypical chest pain, and hypertension.  HISTORY OF PRESENT ILLNESS:  The patient has a past history of hypertension. 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 At the time, she noted blurry vision, feeling of faint and weakness, passed out with loss of consciousness from 1 to 3 minutes without associated loss of urine or bowel control and without associated tongue-biting.  She states that her blood pressure during the time was 150/102.  She works as a Insurance account manager and was transferred to the [facility] Emergency Room.  Results of studies per the patient indicates that she underwent a 12-lead EKG and blood studies and was told at that time that her potassium was low.  She was to see Dr. Chevis Pretty as an outpatient.  Two days later while standing again at work, she noted that she had just gotten through eating, which had occurred on the first day as well, and she felt faint and weak.  She went to the sitting position. She did not pass out.  There was no syncope.  She felt poorly for approximately 30 minutes.  There was some fluttering sensation in her heart. Her third episode occurred on Sunday, March 23, 2001, when she felt that morning that she might have a sinus infection but went to church and felt okay, got up  and sit up in the church, and then sat down and rolled over and sat down.  She did not have any definite loss of consciousness but felt bad for approximately one and one-half hours.  She was seen in the emergency room and admitted on March 23, 2001.  She has been evaluated by Dr. Fraser Din for light-headedness, blurred vision, and nausea, occurring at times in the standing position and has undergone a stress Cardiolite test, which was unremarkable.  A 12-lead EKG, showing sinus bradycardia and sinus arrhythmia with septal infarct, age indeterminate; blood studies with glucose of 96, BUN 13, sodium 142, potassium 3.7, chloride 107, bicarbonate 25.0, creatinine 1.0, troponins less than 0.01 and less than 0.02, cholesterol of 183, triglycerides of 146, HDLs of 35, LDLs of 119.  A CK was 104.  CK-MB was less than 0.3. Repeat CK was 104 with another CK-MB of less than 0.3.  The patient does have a history of being depressed and was treated with Zoloft _______.  She does not smoke cigarettes.  She does not drink alcohol.  She is not on any aspirin therapy.  REVIEW OF SYSTEMS:  Her neurologic review of systems reveals  some vague episodes, possibly suggesting spinning or vertigo, that occur in the sitting and in the lying position.  Otherwise, she has been in good health.  She has noted some pain occurring with her menstrual cycle.  She has no history of endometriosis and denies that currently she could be pregnant.  PHYSICAL EXAMINATION:  GENERAL:  Examination revealed a well-developed black female in no acute distress who in the sitting position had a blood pressure in the right and left arms of 150/80 and 140/80, which went in the standing position to 140/80. The heart rate was in the 60s with normal sinus rhythm.  She was afebrile. There were no bruits.  MENTAL STATUS:  She was alert and oriented x 3.  NEUROLOGIC:  Her cranial nerve examination revealed acuity with eyeglasses to be 20/20  bilaterally.  Both disks were seen and flat.  There was no evidence of papillitis.  The extraocular movements were full.  Corneals were present. The facial sensation was equal.  There was no seventh nerve palsy.  Hearing was intact with air conduction greater than bone conduction.  The tongue was midline.  The uvula was midline.  Gags were present.  Sternocleidomastoid and trapezius testing normal.  The motor examination revealed good strength in the upper and lower extremities.  Sensory examination was intact to pinprick, touch, joint position, and vibration.  Deep tendon reflexes were 2+, and plantar responses were downgoing.  Gait examination revealed astasia-abasia.  IMPRESSION: 1. Near syncope, code 780.2. 2. Possible vertigo, code 780.4. 3. Blurred vision, code 368.8. 4. Hypertension, code 796.2. 5. History of depression, code 311.  PLAN:  The plan at this time is to evaluate these episodes for near syncope with MRI and EEG. DD:  03/25/01 TD:  03/25/01 Job: [id number]

## 2011-05-04 NOTE — Op Note (Signed)
NAME:  [name], [name]                         ACCOUNT NO.:  [id number]   MEDICAL RECORD NO.:  [id number]                   PATIENT TYPE:  AMB   LOCATION:  SDC                                  FACILITY:  [facility]   PHYSICIAN:  Malva Limes, M.D.                 DATE OF BIRTH:  [birth year]   DATE OF PROCEDURE:  11/04/2002  DATE OF DISCHARGE:                                 OPERATIVE REPORT   PREOPERATIVE DIAGNOSIS:  Menorrhagia.   POSTOPERATIVE DIAGNOSIS:  Menorrhagia.   PROCEDURES:  1. Dilation and curettage.  2. Cryoablation of the endometrial cavity.   SURGEON:  Malva Limes, M.D.   ANESTHESIA:  MAC with paracervical block.   ESTIMATED BLOOD LOSS:  Minimal.   COMPLICATIONS:  None.   SPECIMENS:  Endometrial curettings sent to pathology.   DRAINS:  None.   DESCRIPTION OF PROCEDURE:  The patient was taken to the operating room,  where she was placed in the dorsal lithotomy position and MAC anesthesia was  administered without complications.  The patient prepped with Hibiclens and  draped in the usual fashion for this procedure.  A sterile speculum was  placed into the vagina.  Twenty cubic centimeters of 1% lidocaine was used  for paracervical block.  A single-tooth tenaculum was applied to the  anterior cervical lip.  The cervical os was then dilated to a 17 [place].  The uterus was sounded to 10 cm.  The cryoprobe was then set up.  The  cryoprobe was placed in the right cornu, and freezing occurred for seven  minutes.  A similar procedure was performed in the opposite cornu.  The  lower uterine segment was then frozen for 4-1/2 minutes.  The patient  tolerated the procedure well, and she will be discharged to home.  She will  be instructed to follow up in the office in four weeks.                                               Malva Limes, M.D.    MA/MEDQ  D:  11/04/2002  T:  11/04/2002  Job:  [id number]

## 2011-05-04 NOTE — Discharge Summary (Signed)
. [facility]  Patient:    [name], [name]                      MRN: [id number] Adm. Date:  [id number] Disc. Date: 03/27/01 Attending:  Meade Maw A Dictator:   Anselm Lis, N.P. CC:         Leveda Anna, M.D., [facility]  Genene Churn. Love, M.D.   Discharge Summary  PRIMARY CARE PHYSICIAN:  Leveda Anna, M.D.  DATE OF BIRTH:  [birth year]  CONSULTANTS:  Genene Churn. Love, M.D.  PROCEDURES: 1. Adenosine Cardiolite which was negative for myocardial ischemia:  EF of    54%. 2. Electroencephalography:  Normal. 3. Magnetic resonance imaging:  Normal, except for a few tiny foci of signal    abnormality which could represent the earliest manifestation of small    vessel disease. 4. Sleep deprived electroencephalography:  Results pending at the time of this    dictation. 5. Intracranial magnetic resonance angiography:  Normal intracranial MR    angiography with respect to the large and medium sized vessels.  DISCHARGE DIAGNOSES: 1. Atypical chest pain:  Ruled out for myocardial infarction by negative    serial cardiac enzymes without ischemic changes. 2. Hypertension:  Antihypertensive (Diovan) discontinued at the time of    admission and will remain off blood pressure medications in the setting of    a history of syncope.  The blood pressure has been overall within normal    range during the course of this admission. 3. Near syncope:  Evaluation by Genene Churn. Love, M.D., including waking and    drowsy state electroencephalography and intracranial magnetic resonance    angiography were unrevealing.  Her sleep deprived electroencephalography    was pending at the time of this dictation.  Magnetic resonance imaging of    the brain was essentially normal, though a few tiny foci of signal    abnormality in the white matter of both cerebral hemispheres, which could    represent the earliest manifestation of small vessel disease.  The patient   does have a positive family history of seizures.  Genene Churn. Love, M.D., was    following up on sleep deprived electroencephalography for the possibility    of seizures in this patient. 4. History of depression on p.r.n. Zoloft prior to admission.  PLAN: 1. It is anticipated that the patient will be discharged home pending    Dr. Genene Churn. Loves approval (pending sleep deprived EEG results). 2. Discharge medications:  Protonix 40 mg p.o. q.d. 3. Activity:  As tolerated or as otherwise advised by Genene Churn. Love, M.D. 4. Diet:  Low salt, no added salt. 5. Special instructions:  Stop blood pressure medications. 6. Follow-up:  Meade Maw, M.D., on Monday, May 26, 2001, at 10:45 a.m.    The patient is to make an appointment to be seen by her primary M.D.,    Leveda Anna, M.D., on next available.  She may need to consider referral    to a gastroenterologist or a throat specialist as the patient has had    symptoms of frequent pyrosis and a sensation of regurgitation beneath the    sternum.  BRIEF HISTORY OF PRESENT ILLNESS:  (Please also see the dictated H&P.) Ms. [name] is a 50 year old female, who ______ two months earlier, experienced a single episode of associated blurred vision and loss of consciousness for one to three minutes without loss of bowel/bladder or tongue biting.  She was transferred to [facility] where she was told her potassium was low.  The EKG and laboratory work were okay.  A few days after this, she again, postprandial experienced presyncope and weakness. This lasted approximately 30 minutes with an associated fluttering sensation in her heart.  The very next Sunday, March 23, 2001, when standing up in church, she felt weak and had to sit down.  No definite loss of consciousness, but felt bad for approximately a half of an hour.  She admitted to diaphoresis and when EMS arrived was also complaining of mid chest and left arm discomfort which  resolved after sublingual nitroglycerin spray.  She was brought to the ER where the chest discomfort resolved.  She had recurrence while in the ER of lightheadedness and weakness with heart rate dropping from 60 to 44 beats per minute and sinus bradycardia.  Recurrence of chest discomfort as "something stuck in my chest."  Arm discomfort described as heaviness.  The patient has had a prior echocardiogram which was unremarkable.  HOSPITAL COURSE:  She was admitted to telemetry where she underwent an adenosine Cardiolite which was negative for ischemia with good ejection fraction of 54%.  Genene Churn. Love, M.D., of neurology was consulted.  Evaluation included EEG (normal), MRI which was normal, except for a small area of small vascular disease, ischemic type.  MRA was normal.  He did order a sleep deprived EEG secondary to elicited family history of seizures.  The results of that study are pending at the time of this discharge.  Because of her prior history of near syncope, it was felt best to withhold further blood pressure medications, especially in view that her blood pressure was essentially within normal range during the course of this admission.  The patient was complaining of some reflux-type symptoms.  It is anticipated that Leveda Anna, M.D., will follow up on this in clinic.  LABORATORY TESTS AND DATA:  The CBC revealed a WBC of 10.2, hemoglobin 12.9, hematocrit 37.8, and platelets 381.  LFTs within normal range.  Sodium 142, K 3.7, chloride 107, glucose 96, BUN 13, creatinine 1.0.  Serial cardiac enzymes were negative x 3 sets, including CK-MB and troponin I.  The cholesterol was 183 with triglycerides 146, HDL of 35, and LDL of 119.  Other studies as detailed above.  The total time of dictation was 33 minutes, including this discharge summary note, comparing discharge summary instruction sheet, and discussion with the patient, as well as setting up follow-up office visits. DD:   03/27/01 TD:  03/27/01 Job: [id number]

## 2011-05-10 ENCOUNTER — Ambulatory Visit (INDEPENDENT_AMBULATORY_CARE_PROVIDER_SITE_OTHER): Payer: 59 | Admitting: Family Medicine

## 2011-05-10 ENCOUNTER — Telehealth: Payer: Self-pay | Admitting: *Deleted

## 2011-05-10 VITALS — BP 132/86 | HR 101 | Temp 98.0°F | Resp 16 | Wt 178.0 lb

## 2011-05-10 DIAGNOSIS — M6289 Other specified disorders of muscle: Secondary | ICD-10-CM

## 2011-05-10 DIAGNOSIS — R29898 Other symptoms and signs involving the musculoskeletal system: Secondary | ICD-10-CM

## 2011-05-10 DIAGNOSIS — R110 Nausea: Secondary | ICD-10-CM

## 2011-05-10 LAB — CBC WITH DIFFERENTIAL/PLATELET
Basophils Absolute: 0 10*3/uL (ref 0.0–0.1)
Basophils Relative: 0.4 % (ref 0.0–3.0)
Eosinophils Absolute: 0.1 10*3/uL (ref 0.0–0.7)
HCT: 40.1 % (ref 36.0–46.0)
Hemoglobin: 13.6 g/dL (ref 12.0–15.0)
Lymphs Abs: 2.8 10*3/uL (ref 0.7–4.0)
MCHC: 34 g/dL (ref 30.0–36.0)
MCV: 91.6 fl (ref 78.0–100.0)
Neutro Abs: 5.3 10*3/uL (ref 1.4–7.7)
RDW: 14.7 % — ABNORMAL HIGH (ref 11.5–14.6)

## 2011-05-10 NOTE — Patient Instructions (Signed)
Get back on regular use of Prilosec. Follow up immediately for any fever, abdominal pain, or any other new symptoms.

## 2011-05-10 NOTE — Progress Notes (Signed)
Subjective:    Patient ID: [name], female    DOB: Jan 02, 1961, 50 y.o.   MRN: [id number]  HPI Patient seen with chief complaint of nausea. Duration 3 days. She denies any vomiting. No abdominal pain. Nausea is relatively constant. Moderate severity. No alleviating factors. No clear exacerbating factors. No new medications. Only regular medications are amlodipine and lisinopril. No longer takes Lexapro. Takes Prilosec intermittently for reflux. Occasional reflux symptoms. Occasional belching and bitter taste in mouth. No dysphagia. Last menstrual period ninth of May and normal.  Patient denies any stool changes, headaches, vertigo, or sinus congestion. Has lost about 5 pounds past few days. Some increased thirst. No urine frequency. History of mildly elevated blood sugars previously.  Patient also complains of extreme muscle fatigue. No real myalgias. No arthralgias. No new rashes. Recent TSH normal. Does not drink milk.   Review of Systems  Constitutional: Positive for fatigue.  HENT: Negative for congestion and sinus pressure.   Respiratory: Negative for cough, shortness of breath and wheezing.   Cardiovascular: Negative for chest pain, palpitations and leg swelling.  Gastrointestinal: Positive for nausea. Negative for vomiting, abdominal pain, diarrhea, constipation, blood in stool, abdominal distention and rectal pain.  Genitourinary: Negative for dysuria and hematuria.  Musculoskeletal: Negative for arthralgias.  Skin: Negative for rash.  Neurological: Negative for dizziness and headaches.       Objective:   Physical Exam  Constitutional: She is oriented to person, place, and time. She appears well-developed and well-nourished.  HENT:  Head: Normocephalic.  Right Ear: External ear normal.  Left Ear: External ear normal.  Mouth/Throat: Oropharynx is clear and moist.  Eyes: No scleral icterus.  Neck: Neck supple. No thyromegaly present.  Cardiovascular: Normal rate,  regular rhythm and normal heart sounds.   Pulmonary/Chest: Effort normal and breath sounds normal. No respiratory distress. She has no wheezes. She has no rales.  Abdominal: Soft. She exhibits no distension and no mass. There is no rebound and no guarding.       Patient has minimal right lower quadrant tenderness but no guarding or rebound.  Lymphadenopathy:    She has no cervical adenopathy.  Neurological: She is alert and oriented to person, place, and time.  Skin: No rash noted.          Assessment & Plan:  #1 nausea. She has had history of reflux and some ongoing reflux type symptoms. No associated vomiting. She is not complaining of any abdominal pain but some very mild tenderness to palpation. Check labs including CBC, hepatic panel, and basic metabolic panel. Get back on Prilosec 1 daily. Follow up with primary if nausea persists or a new symptoms #2 muscle fatigue. Recent thyroids normal. Check vitamin D level

## 2011-05-10 NOTE — Telephone Encounter (Signed)
Pharmacy is requesting Rx for ProAir [facility] Inhaler This medication was removed from Pt's med list on 03/17/2010. Please Advise.

## 2011-05-11 ENCOUNTER — Other Ambulatory Visit: Payer: Self-pay | Admitting: Family Medicine

## 2011-05-11 ENCOUNTER — Telehealth: Payer: Self-pay | Admitting: *Deleted

## 2011-05-11 ENCOUNTER — Encounter: Payer: Self-pay | Admitting: Family Medicine

## 2011-05-11 LAB — HEPATIC FUNCTION PANEL
Albumin: 4.1 g/dL (ref 3.5–5.2)
Total Protein: 6.7 g/dL (ref 6.0–8.3)

## 2011-05-11 LAB — BASIC METABOLIC PANEL
BUN: 14 mg/dL (ref 6–23)
CO2: 27 mEq/L (ref 19–32)
Calcium: 9.3 mg/dL (ref 8.4–10.5)
Chloride: 105 mEq/L (ref 96–112)
Creatinine, Ser: 1 mg/dL (ref 0.4–1.2)
Glucose, Bld: 98 mg/dL (ref 70–99)

## 2011-05-11 LAB — VITAMIN D 25 HYDROXY (VIT D DEFICIENCY, FRACTURES): Vit D, 25-Hydroxy: 18 ng/mL — ABNORMAL LOW (ref 30–89)

## 2011-05-11 MED ORDER — ALBUTEROL SULFATE HFA 108 (90 BASE) MCG/ACT IN AERS: 2.0000 | INHALATION_SPRAY | RESPIRATORY_TRACT | Status: DC | PRN

## 2011-05-11 MED ORDER — PROMETHAZINE HCL 25 MG PO TABS: 25.0000 mg | ORAL_TABLET | Freq: Four times a day (QID) | ORAL | Status: DC | PRN

## 2011-05-11 MED ORDER — VITAMIN D (ERGOCALCIFEROL) 1.25 MG (50000 UNIT) PO CAPS: 50000.0000 [IU] | ORAL_CAPSULE | ORAL | Status: DC

## 2011-05-11 NOTE — Telephone Encounter (Signed)
Call in Proair 2 puffs every 4 hours prn SOB, #1 with 11 rf

## 2011-05-11 NOTE — Telephone Encounter (Signed)
Call in Phenergan 25 mg tabs to use q 4 hours prn nausea, #60 with 2 rf 

## 2011-05-11 NOTE — Progress Notes (Signed)
Quick Note:  Spoke with pt - informed of lab results and new rx sent to [address]. Aware she needs to rov in 3 mos for lab to check vit d level. ______

## 2011-05-11 NOTE — Telephone Encounter (Signed)
Pt called to check on status of getting med for nausea and also getting the results to labs. Pls call asap today.

## 2011-05-11 NOTE — Telephone Encounter (Signed)
Phenergan called in for pt per previous phone note.  Pharmacy will call her when ready.

## 2011-05-11 NOTE — Telephone Encounter (Signed)
Pt is asking for RX for nausea.  Saw Dr. Caryl Never yesterday.

## 2011-05-11 NOTE — Telephone Encounter (Signed)
Rx Done. ProAir HFA & Phenergan 25mg  tab *See phone note from 05/10/11 as well

## 2011-05-15 NOTE — Progress Notes (Signed)
Quick Note:  Pt informed ______ 

## 2011-05-16 ENCOUNTER — Ambulatory Visit (INDEPENDENT_AMBULATORY_CARE_PROVIDER_SITE_OTHER): Payer: 59 | Admitting: Family Medicine

## 2011-05-16 ENCOUNTER — Encounter: Payer: Self-pay | Admitting: Family Medicine

## 2011-05-16 VITALS — BP 138/88 | HR 103 | Temp 98.5°F | Resp 14 | Wt 176.2 lb

## 2011-05-16 DIAGNOSIS — N390 Urinary tract infection, site not specified: Secondary | ICD-10-CM

## 2011-05-16 DIAGNOSIS — R109 Unspecified abdominal pain: Secondary | ICD-10-CM

## 2011-05-16 LAB — POCT URINALYSIS DIPSTICK
Glucose, UA: NEGATIVE
Nitrite, UA: NEGATIVE
Protein, UA: NEGATIVE
Urobilinogen, UA: 0.2

## 2011-05-16 MED ORDER — CIPROFLOXACIN HCL 500 MG PO TABS: 500.0000 mg | ORAL_TABLET | Freq: Two times a day (BID) | ORAL | Status: DC

## 2011-05-16 MED ORDER — ESCITALOPRAM OXALATE 20 MG PO TABS: 20.0000 mg | ORAL_TABLET | Freq: Every day | ORAL | Status: DC

## 2011-05-16 MED ORDER — EPINEPHRINE 0.3 MG/0.3ML IJ DEVI: 0.3000 mg | Freq: Once | INTRAMUSCULAR | Status: DC | PRN

## 2011-05-16 NOTE — Progress Notes (Signed)
Subjective:    Patient ID: [name], female    DOB: 07/09/1961, 50 y.o.   MRN: [id number]  HPI Here for follow up of nausea which has been present for about 10 days. She was seen here 6 days ago for this, but no source was found. She had some labs drawn that were all normal but no UA was done. She says that she has had intermittent difficulty urinating and some burning on urination. No fever.    Review of Systems  Constitutional: Negative.   Gastrointestinal: Positive for nausea. Negative for vomiting and abdominal pain.  Genitourinary: Positive for dysuria and difficulty urinating. Negative for flank pain and pelvic pain.       Objective:   Physical Exam  Constitutional: She appears well-developed and well-nourished.  Cardiovascular: Normal rate, regular rhythm, normal heart sounds and intact distal pulses.   Pulmonary/Chest: Effort normal and breath sounds normal.  Abdominal: Soft. Bowel sounds are normal. She exhibits no distension and no mass. There is no tenderness. There is no rebound and no guarding.          Assessment & Plan:  I think her nausea has been from a UTI, so we will treat with Cipro. Drink plenty of water

## 2011-05-18 ENCOUNTER — Ambulatory Visit (INDEPENDENT_AMBULATORY_CARE_PROVIDER_SITE_OTHER): Payer: 59 | Admitting: Family Medicine

## 2011-05-18 ENCOUNTER — Encounter: Payer: Self-pay | Admitting: Family Medicine

## 2011-05-18 VITALS — BP 120/80 | Wt 175.0 lb

## 2011-05-18 DIAGNOSIS — K219 Gastro-esophageal reflux disease without esophagitis: Secondary | ICD-10-CM

## 2011-05-18 DIAGNOSIS — R197 Diarrhea, unspecified: Secondary | ICD-10-CM

## 2011-05-18 DIAGNOSIS — K589 Irritable bowel syndrome without diarrhea: Secondary | ICD-10-CM

## 2011-05-18 MED ORDER — OMEPRAZOLE 40 MG PO CPDR: 40.0000 mg | DELAYED_RELEASE_CAPSULE | Freq: Every day | ORAL | Status: DC

## 2011-05-18 NOTE — Progress Notes (Signed)
Subjective:    Patient ID: [name], female    DOB: Jan 05, 1961, 50 y.o.   MRN: [id number]  HPI Here for 2 days of heartburn, nausea without vomiting, and diarrhea. No fever. Of note, 3 days ago she was here for UTI symptoms and was placed on Cipro. The UTI symptoms have resolved.    Review of Systems  Constitutional: Negative.   Respiratory: Negative.   Cardiovascular: Negative.   Gastrointestinal: Positive for nausea and diarrhea. Negative for vomiting and abdominal pain.       Objective:   Physical Exam  Constitutional: She appears well-developed and well-nourished.  Cardiovascular: Normal rate, regular rhythm, normal heart sounds and intact distal pulses.   Pulmonary/Chest: Effort normal and breath sounds normal.  Abdominal: Soft. Bowel sounds are normal. She exhibits no distension and no mass. There is no tenderness. There is no rebound and no guarding.          Assessment & Plan:  These GI problems are side effects of Cipro, so she will stop taking this immediately. Use Imodium and Phenergan prn. Drink fluids. Stay on Omeprazole 40 mg a day.

## 2011-05-22 ENCOUNTER — Telehealth: Payer: Self-pay | Admitting: *Deleted

## 2011-05-22 MED ORDER — LEXAPRO 20 MG PO TABS: ORAL_TABLET | ORAL | Status: DC

## 2011-05-22 NOTE — Telephone Encounter (Signed)
Call in BRAND NAME only Lexapro 20 mg a day , #30 with 11 rf. As far as the vitamin D, I cannot specify what ingredients it has in it. She needs to get advice from her pharmacist about this

## 2011-05-22 NOTE — Telephone Encounter (Signed)
Pt will call pharmacist and ask about a product that does not have the peanuts and soy, and call us back.

## 2011-05-22 NOTE — Telephone Encounter (Signed)
Pt states she need brand name Lexapro called to her pharmacy.  She needs a different Vitamin D .....states it is had soy and peanuts in it and she is allergic to it.

## 2011-08-02 ENCOUNTER — Other Ambulatory Visit: Payer: Self-pay | Admitting: Family Medicine

## 2011-11-02 ENCOUNTER — Telehealth: Payer: Self-pay | Admitting: Family Medicine

## 2011-11-02 DIAGNOSIS — I10 Essential (primary) hypertension: Secondary | ICD-10-CM

## 2011-11-02 MED ORDER — AMLODIPINE BESYLATE 10 MG PO TABS: 10.0000 mg | ORAL_TABLET | Freq: Every day | ORAL | Status: DC

## 2011-11-02 NOTE — Telephone Encounter (Signed)
Refill request for Amlodipine and a 90 day supply. I sent script e-scribe.

## 2011-11-07 ENCOUNTER — Encounter (HOSPITAL_COMMUNITY): Payer: Self-pay | Admitting: Emergency Medicine

## 2011-11-07 ENCOUNTER — Emergency Department (HOSPITAL_COMMUNITY)
Admission: EM | Admit: 2011-11-07 | Discharge: 2011-11-07 | Discharge status: Against Medical Advice | Payer: 59 | Attending: Emergency Medicine | Admitting: Emergency Medicine

## 2011-11-07 DIAGNOSIS — Z0389 Encounter for observation for other suspected diseases and conditions ruled out: Secondary | ICD-10-CM | POA: Insufficient documentation

## 2011-11-07 NOTE — ED Notes (Signed)
[name] mother states that foster child has pin worms and is concerned that she also may have pin worms. She complains of itching and rash around rectum.

## 2011-11-19 ENCOUNTER — Ambulatory Visit (INDEPENDENT_AMBULATORY_CARE_PROVIDER_SITE_OTHER): Payer: 59 | Admitting: Family Medicine

## 2011-11-19 ENCOUNTER — Encounter: Payer: Self-pay | Admitting: Family Medicine

## 2011-11-19 VITALS — BP 140/98 | HR 84 | Temp 98.6°F | Wt 185.0 lb

## 2011-11-19 DIAGNOSIS — E559 Vitamin D deficiency, unspecified: Secondary | ICD-10-CM

## 2011-11-19 DIAGNOSIS — J40 Bronchitis, not specified as acute or chronic: Secondary | ICD-10-CM

## 2011-11-19 MED ORDER — HYDROCODONE-HOMATROPINE 5-1.5 MG/5ML PO SYRP: 5.0000 mL | ORAL_SOLUTION | ORAL | Status: AC | PRN

## 2011-11-19 MED ORDER — AZITHROMYCIN 250 MG PO TABS: ORAL_TABLET | ORAL | Status: AC

## 2011-11-19 NOTE — Progress Notes (Signed)
Subjective:    Patient ID: [name], female    DOB: May 01, 1961, 50 y.o.   MRN: [id number]  HPI Here for 2 weeks of chest tightness and coughing up green sputum. No fever.    Review of Systems  Constitutional: Negative.   HENT: Negative.   Eyes: Negative.   Respiratory: Positive for cough.        Objective:   Physical Exam  Constitutional: She appears well-developed and well-nourished.  HENT:  Right Ear: External ear normal.  Left Ear: External ear normal.  Nose: Nose normal.  Mouth/Throat: Oropharynx is clear and moist. No oropharyngeal exudate.  Eyes: Conjunctivae are normal.  Neck: No thyromegaly present.  Pulmonary/Chest: Effort normal. She has no wheezes. She has no rales.       Scattered rhonchi   Lymphadenopathy:    She has no cervical adenopathy.          Assessment & Plan:  Recheck prn

## 2011-11-20 LAB — VITAMIN D 25 HYDROXY (VIT D DEFICIENCY, FRACTURES): Vit D, 25-Hydroxy: 39 ng/mL (ref 30–89)

## 2011-11-22 ENCOUNTER — Encounter: Payer: Self-pay | Admitting: Family Medicine

## 2011-11-22 NOTE — Progress Notes (Signed)
Quick Note:  Left voice message ______ 

## 2011-11-22 NOTE — Progress Notes (Signed)
Quick Note:  I put a copy of results in mail. ______ 

## 2011-12-07 ENCOUNTER — Ambulatory Visit (INDEPENDENT_AMBULATORY_CARE_PROVIDER_SITE_OTHER): Payer: 59 | Admitting: Family Medicine

## 2011-12-07 DIAGNOSIS — Z23 Encounter for immunization: Secondary | ICD-10-CM

## 2011-12-17 ENCOUNTER — Other Ambulatory Visit: Payer: Self-pay | Admitting: Family Medicine

## 2011-12-17 NOTE — Telephone Encounter (Signed)
Pt called req refill of HYDROcodone-homatropine (HYDROMET) 5-1.5 MG/5ML syrup. Pt still has cough and congestion. Pt said that she lost her original bottle of med. Pls call in to [facility] on [address].

## 2011-12-19 NOTE — Telephone Encounter (Signed)
Call in 240 ml bottle with no rf. Take one tsp q 4 hours pern

## 2011-12-20 MED ORDER — HYDROCODONE-HOMATROPINE 5-1.5 MG/5ML PO SYRP: 5.0000 mL | ORAL_SOLUTION | ORAL | Status: AC | PRN

## 2011-12-20 NOTE — Telephone Encounter (Signed)
Script called in and pt aware. 

## 2012-01-24 ENCOUNTER — Other Ambulatory Visit: Payer: Self-pay | Admitting: Obstetrics and Gynecology

## 2012-01-24 ENCOUNTER — Other Ambulatory Visit (HOSPITAL_COMMUNITY)
Admission: RE | Admit: 2012-01-24 | Discharge: 2012-01-24 | Disposition: A | Discharge status: Home / Self Care | Payer: 59 | Source: Ambulatory Visit | Attending: Obstetrics and Gynecology | Admitting: Obstetrics and Gynecology

## 2012-01-24 DIAGNOSIS — Z124 Encounter for screening for malignant neoplasm of cervix: Secondary | ICD-10-CM | POA: Insufficient documentation

## 2012-01-24 DIAGNOSIS — Z1159 Encounter for screening for other viral diseases: Secondary | ICD-10-CM | POA: Insufficient documentation

## 2012-03-17 ENCOUNTER — Telehealth: Payer: Self-pay | Admitting: Family Medicine

## 2012-03-17 NOTE — Telephone Encounter (Signed)
Patient called stating that she would like to know how much vitamin D she is to take daily. Please advise.

## 2012-03-17 NOTE — Telephone Encounter (Signed)
Spoke with pt

## 2012-04-23 ENCOUNTER — Ambulatory Visit (INDEPENDENT_AMBULATORY_CARE_PROVIDER_SITE_OTHER): Payer: 59 | Admitting: Family Medicine

## 2012-04-23 ENCOUNTER — Encounter: Payer: Self-pay | Admitting: Family Medicine

## 2012-04-23 ENCOUNTER — Other Ambulatory Visit (INDEPENDENT_AMBULATORY_CARE_PROVIDER_SITE_OTHER): Payer: 59

## 2012-04-23 DIAGNOSIS — E669 Obesity, unspecified: Secondary | ICD-10-CM

## 2012-04-23 DIAGNOSIS — Z0000 Encounter for general adult medical examination without abnormal findings: Secondary | ICD-10-CM

## 2012-04-23 DIAGNOSIS — K5900 Constipation, unspecified: Secondary | ICD-10-CM

## 2012-04-23 DIAGNOSIS — I10 Essential (primary) hypertension: Secondary | ICD-10-CM

## 2012-04-23 LAB — BASIC METABOLIC PANEL
BUN: 16 mg/dL (ref 6–23)
CO2: 26 mEq/L (ref 19–32)
Chloride: 105 mEq/L (ref 96–112)
Glucose, Bld: 104 mg/dL — ABNORMAL HIGH (ref 70–99)
Potassium: 3.7 mEq/L (ref 3.5–5.1)

## 2012-04-23 LAB — TSH: TSH: 1.22 u[IU]/mL (ref 0.35–5.50)

## 2012-04-23 LAB — HEPATIC FUNCTION PANEL
ALT: 22 U/L (ref 0–35)
Albumin: 4.2 g/dL (ref 3.5–5.2)
Total Bilirubin: 0.6 mg/dL (ref 0.3–1.2)

## 2012-04-23 LAB — LIPID PANEL
Cholesterol: 178 mg/dL (ref 0–200)
HDL: 50.2 mg/dL (ref >39.00)
VLDL: 11.4 mg/dL (ref 0.0–40.0)

## 2012-04-23 LAB — CBC WITH DIFFERENTIAL/PLATELET
Basophils Absolute: 0 10*3/uL (ref 0.0–0.1)
HCT: 39.7 % (ref 36.0–46.0)
Lymphs Abs: 2.9 10*3/uL (ref 0.7–4.0)
MCV: 89.1 fl (ref 78.0–100.0)
Monocytes Absolute: 0.6 10*3/uL (ref 0.1–1.0)
Neutro Abs: 4.2 10*3/uL (ref 1.4–7.7)
Platelets: 356 10*3/uL (ref 150.0–400.0)
RDW: 14.4 % (ref 11.5–14.6)

## 2012-04-23 LAB — POCT URINALYSIS DIPSTICK
Ketones, UA: NEGATIVE
Leukocytes, UA: NEGATIVE
Protein, UA: NEGATIVE
Spec Grav, UA: 1.025

## 2012-04-23 NOTE — Progress Notes (Signed)
Subjective:    Patient ID: [name], female    DOB: Sep 12, 1961, 51 y.o.   MRN: [id number]  HPI Here to discuss constipation. She has a long hx of IBS, but for the past 2 months she has had predominantly constipation. She feels bloated and passes a lot of gas. No fever or nausea. She tries to eat plenty of fruits, vegetables, and whole grains. She drinks plenty of water. She has been working out with a Systems analyst 5 days a week, and she has lost 20 lbs since last December. She has been taking a probiotic pill every day she bought OTC.    Review of Systems  Constitutional: Negative.   Gastrointestinal: Positive for constipation and abdominal distention. Negative for nausea, vomiting, abdominal pain, diarrhea and blood in stool.       Objective:   Physical Exam  Constitutional: She appears well-developed and well-nourished.  Abdominal: Soft. Bowel sounds are normal. She exhibits no distension and no mass. There is no tenderness. There is no rebound and no guarding.          Assessment & Plan:  Constipation. She will start using Miralax bid. She can use Milk of Magnesia once in awhile prn. At her request we will refer her to Nutrition to help with her diet.

## 2012-04-24 LAB — VITAMIN D 25 HYDROXY (VIT D DEFICIENCY, FRACTURES): Vit D, 25-Hydroxy: 40 ng/mL (ref 30–89)

## 2012-04-28 NOTE — Progress Notes (Signed)
Quick Note:  Left voice message with normal results. ______ 

## 2012-04-30 ENCOUNTER — Ambulatory Visit (INDEPENDENT_AMBULATORY_CARE_PROVIDER_SITE_OTHER): Payer: 59 | Admitting: Family Medicine

## 2012-04-30 ENCOUNTER — Encounter: Payer: Self-pay | Admitting: Family Medicine

## 2012-04-30 VITALS — BP 130/80 | HR 109 | Temp 97.8°F | Ht 63.5 in | Wt 166.0 lb

## 2012-04-30 DIAGNOSIS — Z0000 Encounter for general adult medical examination without abnormal findings: Secondary | ICD-10-CM

## 2012-04-30 NOTE — Progress Notes (Signed)
Subjective:    Patient ID: [name], female    DOB: [birth year], 51 y.o.   MRN: [id number]  HPI 51 yr old female for a cpx. She feels well and has no concerns.    Review of Systems  Constitutional: Negative.   HENT: Negative.   Eyes: Negative.   Respiratory: Negative.   Cardiovascular: Negative.   Gastrointestinal: Negative.   Genitourinary: Negative for dysuria, urgency, frequency, hematuria, flank pain, decreased urine volume, enuresis, difficulty urinating, pelvic pain and dyspareunia.  Musculoskeletal: Negative.   Skin: Negative.   Neurological: Negative.   Hematological: Negative.   Psychiatric/Behavioral: Negative.        Objective:   Physical Exam  Constitutional: She is oriented to person, place, and time. She appears well-developed and well-nourished. No distress.  HENT:  Head: Normocephalic and atraumatic.  Right Ear: External ear normal.  Left Ear: External ear normal.  Nose: Nose normal.  Mouth/Throat: Oropharynx is clear and moist. No oropharyngeal exudate.  Eyes: Conjunctivae and EOM are normal. Pupils are equal, round, and reactive to light. No scleral icterus.  Neck: Normal range of motion. Neck supple. No JVD present. No thyromegaly present.  Cardiovascular: Normal rate, regular rhythm, normal heart sounds and intact distal pulses.  Exam reveals no gallop and no friction rub.   No murmur heard. Pulmonary/Chest: Effort normal and breath sounds normal. No respiratory distress. She has no wheezes. She has no rales. She exhibits no tenderness.  Abdominal: Soft. Bowel sounds are normal. She exhibits no distension and no mass. There is no tenderness. There is no rebound and no guarding.  Musculoskeletal: Normal range of motion. She exhibits no edema and no tenderness.  Lymphadenopathy:    She has no cervical adenopathy.  Neurological: She is alert and oriented to person, place, and time. She has normal reflexes. No cranial nerve deficit. She exhibits normal  muscle tone. Coordination normal.  Skin: Skin is warm and dry. No rash noted. No erythema.  Psychiatric: She has a normal mood and affect. Her behavior is normal. Judgment and thought content normal.          Assessment & Plan:  Well exam.

## 2012-05-16 ENCOUNTER — Ambulatory Visit: Payer: 59 | Admitting: *Deleted

## 2012-08-15 ENCOUNTER — Telehealth: Payer: Self-pay

## 2012-08-15 ENCOUNTER — Ambulatory Visit (INDEPENDENT_AMBULATORY_CARE_PROVIDER_SITE_OTHER): Payer: 59 | Admitting: Family Medicine

## 2012-08-15 ENCOUNTER — Encounter: Payer: Self-pay | Admitting: Family Medicine

## 2012-08-15 VITALS — BP 128/90 | HR 88 | Temp 98.2°F | Wt 163.0 lb

## 2012-08-15 DIAGNOSIS — M940 Chondrocostal junction syndrome [Tietze]: Secondary | ICD-10-CM

## 2012-08-15 MED ORDER — DICLOFENAC SODIUM 75 MG PO TBEC: 75.0000 mg | DELAYED_RELEASE_TABLET | Freq: Two times a day (BID) | ORAL | Status: DC

## 2012-08-15 NOTE — Telephone Encounter (Signed)
Pt called and states she had real bad chest pain until pt could not breathe.  Top part of pt's chest is sore and she cannot move it.  If pt moves in a certain way it hurts really bad.  Per pt this started about 2 days ago and slowly went away but pain is still there.  Pt states she is belching and has indigestion a lot during the episode. Pt still has pain in her chest but has not had an episode.   Ok per Dr. Clent Ridges for pt to come in this afternoon.  Appt made for 1:15 pm on /30/13.  Pt is aware.

## 2012-08-15 NOTE — Progress Notes (Signed)
Subjective:    Patient ID: [name], female    DOB: 27-Mar-1961, 51 y.o.   MRN: [id number]  HPI Here for one week of intermittent sharp pains in the right chest area. No trauma, no coughing or URI symptoms. Taking a deep breath or raising her arms can make the pain worse. No SOB.    Review of Systems  Constitutional: Negative.   Respiratory: Negative.   Cardiovascular: Positive for chest pain. Negative for palpitations and leg swelling.       Objective:   Physical Exam  Constitutional: She appears well-developed and well-nourished.  Neck: Neck supple. No thyromegaly present.  Cardiovascular: Normal rate, regular rhythm, normal heart sounds and intact distal pulses.   Pulmonary/Chest: Effort normal and breath sounds normal.       Tender over the right chest just below the clavicle   Lymphadenopathy:    She has no cervical adenopathy.          Assessment & Plan:  Rest, Diclofenac. This should resolve soon

## 2012-08-27 ENCOUNTER — Other Ambulatory Visit: Payer: Self-pay | Admitting: Obstetrics and Gynecology

## 2012-09-03 ENCOUNTER — Other Ambulatory Visit: Payer: Self-pay | Admitting: Obstetrics and Gynecology

## 2012-09-03 DIAGNOSIS — R928 Other abnormal and inconclusive findings on diagnostic imaging of breast: Secondary | ICD-10-CM

## 2012-09-05 ENCOUNTER — Ambulatory Visit
Admission: RE | Admit: 2012-09-05 | Discharge: 2012-09-05 | Disposition: A | Discharge status: Home / Self Care | Payer: 59 | Source: Ambulatory Visit | Attending: Obstetrics and Gynecology | Admitting: Obstetrics and Gynecology

## 2012-09-05 DIAGNOSIS — R928 Other abnormal and inconclusive findings on diagnostic imaging of breast: Secondary | ICD-10-CM

## 2012-10-17 ENCOUNTER — Other Ambulatory Visit: Payer: Self-pay | Admitting: Family Medicine

## 2012-10-17 NOTE — Telephone Encounter (Signed)
Pt called req 90 day script for lisinopril (PRINIVIL,ZESTRIL) 20 MG tablet [facility] [address]. Pt is completely out of med.

## 2012-10-20 NOTE — Telephone Encounter (Signed)
I sent script e-scribe. 

## 2013-01-11 ENCOUNTER — Other Ambulatory Visit: Payer: Self-pay | Admitting: Family Medicine

## 2013-02-02 ENCOUNTER — Encounter (HOSPITAL_COMMUNITY): Payer: Self-pay | Admitting: Emergency Medicine

## 2013-02-02 ENCOUNTER — Emergency Department (HOSPITAL_COMMUNITY): Payer: Self-pay

## 2013-02-02 ENCOUNTER — Emergency Department (HOSPITAL_COMMUNITY)
Admission: EM | Admit: 2013-02-02 | Discharge: 2013-02-02 | Disposition: A | Discharge status: Home / Self Care | Payer: Self-pay | Attending: Emergency Medicine | Admitting: Emergency Medicine

## 2013-02-02 DIAGNOSIS — Z3202 Encounter for pregnancy test, result negative: Secondary | ICD-10-CM | POA: Insufficient documentation

## 2013-02-02 DIAGNOSIS — R42 Dizziness and giddiness: Secondary | ICD-10-CM | POA: Insufficient documentation

## 2013-02-02 DIAGNOSIS — F329 Major depressive disorder, single episode, unspecified: Secondary | ICD-10-CM | POA: Insufficient documentation

## 2013-02-02 DIAGNOSIS — F3289 Other specified depressive episodes: Secondary | ICD-10-CM | POA: Insufficient documentation

## 2013-02-02 DIAGNOSIS — R55 Syncope and collapse: Secondary | ICD-10-CM | POA: Insufficient documentation

## 2013-02-02 DIAGNOSIS — Z8742 Personal history of other diseases of the female genital tract: Secondary | ICD-10-CM | POA: Insufficient documentation

## 2013-02-02 DIAGNOSIS — Z87891 Personal history of nicotine dependence: Secondary | ICD-10-CM | POA: Insufficient documentation

## 2013-02-02 DIAGNOSIS — M549 Dorsalgia, unspecified: Secondary | ICD-10-CM | POA: Insufficient documentation

## 2013-02-02 DIAGNOSIS — Z8719 Personal history of other diseases of the digestive system: Secondary | ICD-10-CM | POA: Insufficient documentation

## 2013-02-02 DIAGNOSIS — R209 Unspecified disturbances of skin sensation: Secondary | ICD-10-CM | POA: Insufficient documentation

## 2013-02-02 DIAGNOSIS — Z8619 Personal history of other infectious and parasitic diseases: Secondary | ICD-10-CM | POA: Insufficient documentation

## 2013-02-02 DIAGNOSIS — Z79899 Other long term (current) drug therapy: Secondary | ICD-10-CM | POA: Insufficient documentation

## 2013-02-02 DIAGNOSIS — R011 Cardiac murmur, unspecified: Secondary | ICD-10-CM | POA: Insufficient documentation

## 2013-02-02 DIAGNOSIS — Z8679 Personal history of other diseases of the circulatory system: Secondary | ICD-10-CM | POA: Insufficient documentation

## 2013-02-02 DIAGNOSIS — I10 Essential (primary) hypertension: Secondary | ICD-10-CM | POA: Insufficient documentation

## 2013-02-02 DIAGNOSIS — R5381 Other malaise: Secondary | ICD-10-CM | POA: Insufficient documentation

## 2013-02-02 LAB — CBC WITH DIFFERENTIAL/PLATELET
Basophils Relative: 0 % (ref 0–1)
Eosinophils Absolute: 0.6 10*3/uL (ref 0.0–0.7)
Eosinophils Relative: 6 % — ABNORMAL HIGH (ref 0–5)
Hemoglobin: 15.8 g/dL — ABNORMAL HIGH (ref 12.0–15.0)
Lymphs Abs: 3.7 10*3/uL (ref 0.7–4.0)
MCH: 30.7 pg (ref 26.0–34.0)
MCHC: 35.3 g/dL (ref 30.0–36.0)
MCV: 87 fL (ref 78.0–100.0)
Monocytes Relative: 8 % (ref 3–12)
Platelets: 347 10*3/uL (ref 150–400)
RBC: 5.14 MIL/uL — ABNORMAL HIGH (ref 3.87–5.11)

## 2013-02-02 LAB — URINALYSIS, ROUTINE W REFLEX MICROSCOPIC
Bilirubin Urine: NEGATIVE
Ketones, ur: NEGATIVE mg/dL
Nitrite: NEGATIVE
Specific Gravity, Urine: 1.011 (ref 1.005–1.030)
Urobilinogen, UA: 0.2 mg/dL (ref 0.0–1.0)

## 2013-02-02 LAB — URINE MICROSCOPIC-ADD ON

## 2013-02-02 LAB — POCT PREGNANCY, URINE: Preg Test, Ur: NEGATIVE

## 2013-02-02 LAB — COMPREHENSIVE METABOLIC PANEL
Albumin: 4.7 g/dL (ref 3.5–5.2)
BUN: 10 mg/dL (ref 6–23)
Calcium: 9.9 mg/dL (ref 8.4–10.5)
Creatinine, Ser: 0.7 mg/dL (ref 0.50–1.10)
GFR calc Af Amer: >90 mL/min (ref >90)
Glucose, Bld: 83 mg/dL (ref 70–99)
Total Protein: 8.1 g/dL (ref 6.0–8.3)

## 2013-02-02 NOTE — ED Provider Notes (Signed)
I saw and evaluated the patient, reviewed the resident's note and I agree with the findings and plan. I have reviewed EKG and agree with the resident interpretation.  you Pt with atypical musculoskeletal chest wall pain with near syncopal event today.  Pt states that she has been using a medical cleanse. States she has been urinating frequently.  Orthostatics neg.  Blood work neg.  EKG wnl.  Normal exam.  Feel cleanse most likely has diuretic.  K wnl.  Recommended pt stop cleanse.  Gwyneth Sprout, MD 02/02/13 [id number]

## 2013-02-02 NOTE — ED Notes (Signed)
Patient transported to X-ray 

## 2013-02-02 NOTE — ED Notes (Signed)
Pt to ED via [facility] from [facility].  St's she was there ref. Back and arm pain when she started having mid upper chest pain and feeling weak.  St's only last short period of time.  Pt alert and oriented at this time  Pt only c/io headache at present.

## 2013-02-02 NOTE — ED Provider Notes (Signed)
History     CSN: [id number]  Arrival date & time 02/02/13  1627   First MD Initiated Contact with Patient 02/02/13 1742      Chief Complaint  Patient presents with  . Chest Pain    (Consider location/radiation/quality/duration/timing/severity/associated sxs/prior treatment) HPI Comments: Pt reports having a mechanical fall this morning onto to buttock.  Since then, she has had intermittent h/a, and R arm, R leg pain.  At chiropractor office today she had 2 episodes of lightheadedness, palpations, followed by a few seconds of chest pressure.  She may have had a syncopal episode but is not sure.  She reports doing  "cleanse" at home for which she is taking 5 unknown pills in the morning w/ 16oz water.  She reports that is the only fluid she had had today, but has had a much greater than normal urine output.  She is unsure if one of the pills is a diuretic.    Patient is a 52 y.o. female presenting with chest pain and syncope. The history is provided by the patient. No language interpreter was used.  Chest Pain Pain location:  Substernal area Pain quality: tightness   Pain radiates to:  Does not radiate Pain radiates to the back: no   Pain severity:  No pain Onset quality:  Sudden Duration: secondsm twice. Timing:  Intermittent Progression:  Resolved Chronicity:  New Context comment:  After episode of lightheadedness and palpitations while at chiropractor office Relieved by:  Nothing Worsened by:  Nothing tried Ineffective treatments:  None tried Associated symptoms: back pain, dizziness, numbness (RLE, RUE, now resolved), syncope and weakness   Associated symptoms: no abdominal pain, no claudication, no cough, no diaphoresis, no dysphagia, no fatigue, no fever, no headache, no lower extremity edema, no nausea, no palpitations, no shortness of breath and not vomiting   Loss of Consciousness  Associated symptoms include back pain, chest pain, dizziness, light-headedness and weakness.  Pertinent negatives include abdominal pain, confusion, congestion, diaphoresis, fever, headaches, nausea, palpitations and vomiting.    Past Medical History  Diagnosis Date  . Depression   . Hypertension   . Genital warts   . Cardiac arrhythmia   . Heart murmur   . Fibroids     uterine sees Dr. Neva Seat in [place]   . Low back pain     has a herniated lumbar disc sees Dr August Saucer  . Allergy     Dr Stevphen [place]  . Irritable bowel syndrome (IBS)     Past Surgical History  Procedure Laterality Date  . Tubal ligation    . Myomectomy      laser ablation of uterine fibroids per Dr Dareen Piano 2000  . Lumbar laminectomy      L3-4 on 02-24-10 per Dr Otelia Sergeant     Family History  Problem Relation Age of Onset  . Arthritis      Family Hx  . Diabetes      Family Hx 1st degree relative  . Hyperlipidemia      Family Hx  . Hypertension      Family Hx  . Prostate cancer      Family Hx 1st Degree relative <50  . Stroke      First Degree Female <50    History  Substance Use Topics  . Smoking status: Former Games developer  . Smokeless tobacco: Never Used  . Alcohol Use: No    OB History   Grav Para Term Preterm Abortions TAB SAB Ect Mult Living  Review of Systems  Constitutional: Negative for fever, chills, diaphoresis, activity change, appetite change and fatigue.  HENT: Negative for congestion, sore throat, facial swelling, rhinorrhea, trouble swallowing, neck pain and neck stiffness.   Eyes: Negative for photophobia and discharge.  Respiratory: Negative for cough, chest tightness and shortness of breath.   Cardiovascular: Positive for chest pain and syncope. Negative for palpitations, claudication and leg swelling.  Gastrointestinal: Negative for nausea, vomiting, abdominal pain and diarrhea.  Endocrine: Negative for polydipsia and polyuria.  Genitourinary: Negative for dysuria, frequency, difficulty urinating and pelvic pain.  Musculoskeletal: Positive for back pain.  Negative for arthralgias.  Skin: Negative for color change and wound.  Allergic/Immunologic: Negative for immunocompromised state.  Neurological: Positive for dizziness, weakness, light-headedness and numbness (RLE, RUE, now resolved). Negative for facial asymmetry and headaches.  Hematological: Does not bruise/bleed easily.  Psychiatric/Behavioral: Negative for confusion and agitation.    Allergies  Hctz; Peanut-containing drug products; Ciprofloxacin; and Shellfish-derived products  Home Medications   Current Outpatient Rx  Name  Route  Sig  Dispense  Refill  . EXPIRED: albuterol (PROAIR HFA) 108 (90 BASE) MCG/ACT inhaler   Inhalation   Inhale 2 puffs into the lungs every 4 (four) hours as needed for wheezing or shortness of breath.   1 Inhaler   11   . amLODipine (NORVASC) 10 MG tablet      TAKE 1 TABLET (10 MG TOTAL) BY MOUTH DAILY.   90 tablet   0   . beclomethasone (QVAR) 80 MCG/ACT inhaler   Inhalation   Inhale 1 puff into the lungs as needed.           . diclofenac (VOLTAREN) 75 MG EC tablet   Oral   Take 1 tablet (75 mg total) by mouth 2 (two) times daily.   60 tablet   5   . EPINEPHrine (EPIPEN 2-PAK) 0.3 mg/0.3 mL DEVI   Intramuscular   Inject 0.3 mLs (0.3 mg total) into the muscle once as needed.   1 Device   5   . Hydrocodone-Acetaminophen 10-660 MG TABS   Oral   Take 10-660 mg by mouth every 6 (six) hours as needed (pain).   60 each   5   . lisinopril (PRINIVIL,ZESTRIL) 20 MG tablet      TAKE 1 TABLET BY MOUTH EVERY DAY   90 tablet   3   . methocarbamol (ROBAXIN) 750 MG tablet   Oral   Take 750 mg by mouth as needed.          Marland Kitchen EXPIRED: omeprazole (PRILOSEC) 40 MG capsule   Oral   Take 1 capsule (40 mg total) by mouth daily.   30 capsule   11   . EXPIRED: promethazine (PHENERGAN) 25 MG tablet   Oral   Take 1 tablet (25 mg total) by mouth every 6 (six) hours as needed for nausea.   60 tablet   2   . sertraline (ZOLOFT) 50 MG  tablet   Oral   Take 50 mg by mouth daily.           BP 125/80  Pulse 62  Temp(Src) 98.1 F (36.7 C) (Oral)  Resp 15  SpO2 99%  LMP 01/25/2013  Physical Exam  Constitutional: She is oriented to person, place, and time. She appears well-developed and well-nourished. No distress.  HENT:  Head: Normocephalic and atraumatic.  Mouth/Throat: No oropharyngeal exudate.  Eyes: Pupils are equal, round, and reactive to light.  Neck: Normal range of motion.  Neck supple.  Cardiovascular: Normal rate, regular rhythm and normal heart sounds.  Exam reveals no gallop and no friction rub.   No murmur heard. Pulmonary/Chest: Effort normal and breath sounds normal. No respiratory distress. She has no wheezes. She has no rales.  Abdominal: Soft. Bowel sounds are normal. She exhibits no distension and no mass. There is no tenderness. There is no rebound and no guarding.  Musculoskeletal: Normal range of motion. She exhibits no edema and no tenderness.  Neurological: She is alert and oriented to person, place, and time. She has normal strength. She displays no tremor. No cranial nerve deficit or sensory deficit. She exhibits normal muscle tone. Coordination normal. GCS eye subscore is 4. GCS verbal subscore is 5. GCS motor subscore is 6.  Skin: Skin is warm and dry.  Psychiatric: She has a normal mood and affect.    ED Course  Procedures (including critical care time)  Labs Reviewed  URINALYSIS, ROUTINE W REFLEX MICROSCOPIC - Abnormal; Notable for the following:    Hgb urine dipstick SMALL (*)    All other components within normal limits  CBC WITH DIFFERENTIAL - Abnormal; Notable for the following:    RBC 5.14 (*)    Hemoglobin 15.8 (*)    Eosinophils Relative 6 (*)    All other components within normal limits  COMPREHENSIVE METABOLIC PANEL - Abnormal; Notable for the following:    Alkaline Phosphatase 118 (*)    All other components within normal limits  URINE MICROSCOPIC-ADD ON - Abnormal;  Notable for the following:    Squamous Epithelial / LPF FEW (*)    All other components within normal limits  POCT PREGNANCY, URINE  POCT I-STAT TROPONIN I   Dg Chest 2 View  02/02/2013  *RADIOLOGY REPORT*  Clinical Data: Chest pain  CHEST - 2 VIEW  Comparison: 06/29/2010  Findings: Lungs are clear without infiltrate or effusion.  Heart size is normal and the vascularity is normal.  Negative for mass lesion.  IMPRESSION: No acute abnormality.   Original Report Authenticated By: Janeece Riggers, M.D.      1. Near syncope       MDM  Pt is a 52 y.o. female with pertinent PMHX of HTN, IBS, arrythmia  who presents with syncope vs ner syncope episodes today and 2 short episodes of CP. Pt reports having a mechanical fall this morning onto to buttock.  Since then, she has had intermittent h/a, and R arm, R leg pain.  At chiropractor office today she had 2 episodes of lightheadedness, palpations, followed by a few seconds of chest pressure.  She may have had a syncopal episode but is not sure.  She reports doing  "cleanse" at home for which she is taking 5 unknown pills in the morning w/ 16oz water.  She reports that is the only fluid she had had today, but has had a much greater than normal urine output.  She is unsure if one of the pills is a diuretic.    7:31 PM Orthostatics + by HR.  I belive symptoms likely due to a diuretic in her diet supplements.  CBC, CMP, POC trop, UA otherwise unremarkable for another cause.  She will d/c these.  Return precautions given for new or worsening symptoms.    1. Near syncope      Labs and imaging considered in decision making, reviewed by myself.  Imaging interpreted by radiology. Pt care discussed with my attending, Dr. Anitra Lauth.      Toy Cookey,  MD 02/02/13 1934

## 2013-03-17 ENCOUNTER — Ambulatory Visit (INDEPENDENT_AMBULATORY_CARE_PROVIDER_SITE_OTHER): Payer: Self-pay | Admitting: Family Medicine

## 2013-03-17 ENCOUNTER — Encounter: Payer: Self-pay | Admitting: Family Medicine

## 2013-03-17 VITALS — BP 136/88 | HR 91 | Temp 97.9°F | Wt 179.0 lb

## 2013-03-17 DIAGNOSIS — Z01818 Encounter for other preprocedural examination: Secondary | ICD-10-CM

## 2013-03-17 LAB — PROTIME-INR: Prothrombin Time: 11.2 s (ref 10.2–12.4)

## 2013-03-17 MED ORDER — METHOCARBAMOL 750 MG PO TABS: 750.0000 mg | ORAL_TABLET | Freq: Three times a day (TID) | ORAL | Status: DC | PRN

## 2013-03-17 MED ORDER — BECLOMETHASONE DIPROPIONATE 80 MCG/ACT IN AERS: 1.0000 | INHALATION_SPRAY | Freq: Two times a day (BID) | RESPIRATORY_TRACT | Status: DC | PRN

## 2013-03-17 MED ORDER — DULOXETINE HCL 30 MG PO CPEP: 30.0000 mg | ORAL_CAPSULE | Freq: Every day | ORAL | Status: DC

## 2013-03-17 MED ORDER — ALBUTEROL SULFATE HFA 108 (90 BASE) MCG/ACT IN AERS: 2.0000 | INHALATION_SPRAY | RESPIRATORY_TRACT | Status: DC | PRN

## 2013-03-17 MED ORDER — HYDROCODONE-ACETAMINOPHEN 10-325 MG PO TABS: 1.0000 | ORAL_TABLET | Freq: Four times a day (QID) | ORAL | Status: DC | PRN

## 2013-03-17 NOTE — Progress Notes (Signed)
Subjective:    Patient ID: [name], female    DOB: [birth year], 52 y.o.   MRN: [id number]  HPI 52 yr old female for a preoperative exam. She is planning on having an abdominoplasty and liposuction procedure in June per Dr. Lorenda Peck in [place]. She feels well in general except for some chronic anxiety. She has taken some samples of Cymbalta and is pleased with how it makes her feel. She wants to stay on this.    Review of Systems  Constitutional: Negative.   HENT: Negative.   Eyes: Negative.   Respiratory: Negative.   Cardiovascular: Negative.   Gastrointestinal: Negative.   Genitourinary: Negative for dysuria, urgency, frequency, hematuria, flank pain, decreased urine volume, enuresis, difficulty urinating, pelvic pain and dyspareunia.  Musculoskeletal: Negative.   Skin: Negative.   Neurological: Negative.   Psychiatric/Behavioral: Negative.        Objective:   Physical Exam  Constitutional: She is oriented to person, place, and time. She appears well-developed and well-nourished. No distress.  HENT:  Head: Normocephalic and atraumatic.  Right Ear: External ear normal.  Left Ear: External ear normal.  Nose: Nose normal.  Mouth/Throat: Oropharynx is clear and moist. No oropharyngeal exudate.  Eyes: Conjunctivae and EOM are normal. Pupils are equal, round, and reactive to light. No scleral icterus.  Neck: Normal range of motion. Neck supple. No JVD present. No thyromegaly present.  Cardiovascular: Normal rate, regular rhythm, normal heart sounds and intact distal pulses.  Exam reveals no gallop and no friction rub.   No murmur heard. Pulmonary/Chest: Effort normal and breath sounds normal. No respiratory distress. She has no wheezes. She has no rales. She exhibits no tenderness.  Abdominal: Soft. Bowel sounds are normal. She exhibits no distension and no mass. There is no tenderness. There is no rebound and no guarding.  Musculoskeletal: Normal range of motion. She  exhibits no edema and no tenderness.  Lymphadenopathy:    She has no cervical adenopathy.  Neurological: She is alert and oriented to person, place, and time. She has normal reflexes. No cranial nerve deficit. She exhibits normal muscle tone. Coordination normal.  Skin: Skin is warm and dry. No rash noted. No erythema.  Psychiatric: She has a normal mood and affect. Her behavior is normal. Judgment and thought content normal.          Assessment & Plan:  She seems to be doing well. Get labs today. Wrote for her to take Cymbalta 30 mg daily. Once the labs are back we can clear her for surgery.

## 2013-03-18 NOTE — Progress Notes (Signed)
Quick Note:  I spoke with pt and put a copy of results in mail. ______ 

## 2013-04-10 ENCOUNTER — Ambulatory Visit (INDEPENDENT_AMBULATORY_CARE_PROVIDER_SITE_OTHER): Payer: Self-pay | Admitting: Family Medicine

## 2013-04-10 ENCOUNTER — Encounter: Payer: Self-pay | Admitting: Family Medicine

## 2013-04-10 VITALS — BP 136/80 | HR 66

## 2013-04-10 DIAGNOSIS — M940 Chondrocostal junction syndrome [Tietze]: Secondary | ICD-10-CM

## 2013-04-10 MED ORDER — METHYLPREDNISOLONE ACETATE 80 MG/ML IJ SUSP
120.0000 mg | Freq: Once | INTRAMUSCULAR | Status: AC
  Administered 2013-04-10: 120 mg via INTRAMUSCULAR

## 2013-04-10 MED ORDER — DICLOFENAC SODIUM 75 MG PO TBEC: 75.0000 mg | DELAYED_RELEASE_TABLET | Freq: Two times a day (BID) | ORAL | Status: DC

## 2013-04-10 NOTE — Progress Notes (Signed)
Subjective:    Patient ID: [name], female    DOB: Jul 06, 1961, 52 y.o.   MRN: [id number]  HPI Here for the sudden onset of sharp pain in the right chest last night. No SOB or nausea. No recent trauma. She had a similar pain last summer which turned out to be costochondritis. She took Motrin last night.    Review of Systems  Constitutional: Negative.   Respiratory: Negative.   Cardiovascular: Positive for chest pain. Negative for palpitations and leg swelling.       Objective:   Physical Exam  Constitutional: She appears well-developed and well-nourished.  Cardiovascular: Normal rate, regular rhythm, normal heart sounds and intact distal pulses.   Pulmonary/Chest: Effort normal and breath sounds normal. No respiratory distress. She has no wheezes. She has no rales.  Tender over the right sternoclavicular joint           Assessment & Plan:  Given a steroid shot to be followed by Diclofenac prn.

## 2013-04-10 NOTE — Addendum Note (Signed)
Addended by: Aniceto Boss A on: 04/10/2013 11:25 AM   Modules accepted: Orders

## 2013-04-16 HISTORY — PX: ABDOMINOPLASTY: SUR9

## 2013-05-13 ENCOUNTER — Telehealth: Payer: Self-pay | Admitting: Family Medicine

## 2013-05-13 NOTE — Telephone Encounter (Signed)
Pt was in for appt.4/25.  Pt needs clearance note for plastic surgery from MD.  Can be faxed to [phone].  AttnFelecia Jan Physician's phone number:  [phone]. Pt would like to know when this has been done. Surgery is 6/5.

## 2013-05-14 NOTE — Telephone Encounter (Signed)
The note was written, please fax it over

## 2013-05-14 NOTE — Telephone Encounter (Signed)
Script was faxed to below number.  

## 2013-06-01 ENCOUNTER — Telehealth: Payer: Self-pay | Admitting: Family Medicine

## 2013-06-01 ENCOUNTER — Encounter (HOSPITAL_COMMUNITY): Payer: Self-pay | Admitting: Emergency Medicine

## 2013-06-01 ENCOUNTER — Emergency Department (HOSPITAL_COMMUNITY)
Admission: EM | Admit: 2013-06-01 | Discharge: 2013-06-01 | Disposition: A | Discharge status: Home / Self Care | Payer: Self-pay | Attending: Emergency Medicine | Admitting: Emergency Medicine

## 2013-06-01 ENCOUNTER — Emergency Department (HOSPITAL_COMMUNITY): Payer: Self-pay

## 2013-06-01 DIAGNOSIS — I10 Essential (primary) hypertension: Secondary | ICD-10-CM | POA: Insufficient documentation

## 2013-06-01 DIAGNOSIS — R0989 Other specified symptoms and signs involving the circulatory and respiratory systems: Secondary | ICD-10-CM | POA: Insufficient documentation

## 2013-06-01 DIAGNOSIS — R0609 Other forms of dyspnea: Secondary | ICD-10-CM | POA: Insufficient documentation

## 2013-06-01 DIAGNOSIS — Z9889 Other specified postprocedural states: Secondary | ICD-10-CM

## 2013-06-01 DIAGNOSIS — Z8679 Personal history of other diseases of the circulatory system: Secondary | ICD-10-CM | POA: Insufficient documentation

## 2013-06-01 DIAGNOSIS — R509 Fever, unspecified: Secondary | ICD-10-CM | POA: Insufficient documentation

## 2013-06-01 DIAGNOSIS — R011 Cardiac murmur, unspecified: Secondary | ICD-10-CM | POA: Insufficient documentation

## 2013-06-01 DIAGNOSIS — R0600 Dyspnea, unspecified: Secondary | ICD-10-CM

## 2013-06-01 DIAGNOSIS — Z8719 Personal history of other diseases of the digestive system: Secondary | ICD-10-CM | POA: Insufficient documentation

## 2013-06-01 DIAGNOSIS — Z8619 Personal history of other infectious and parasitic diseases: Secondary | ICD-10-CM | POA: Insufficient documentation

## 2013-06-01 DIAGNOSIS — F3289 Other specified depressive episodes: Secondary | ICD-10-CM | POA: Insufficient documentation

## 2013-06-01 DIAGNOSIS — Z87891 Personal history of nicotine dependence: Secondary | ICD-10-CM | POA: Insufficient documentation

## 2013-06-01 DIAGNOSIS — Z8739 Personal history of other diseases of the musculoskeletal system and connective tissue: Secondary | ICD-10-CM | POA: Insufficient documentation

## 2013-06-01 DIAGNOSIS — F329 Major depressive disorder, single episode, unspecified: Secondary | ICD-10-CM | POA: Insufficient documentation

## 2013-06-01 DIAGNOSIS — Z79899 Other long term (current) drug therapy: Secondary | ICD-10-CM | POA: Insufficient documentation

## 2013-06-01 DIAGNOSIS — R071 Chest pain on breathing: Secondary | ICD-10-CM | POA: Insufficient documentation

## 2013-06-01 DIAGNOSIS — IMO0002 Reserved for concepts with insufficient information to code with codable children: Secondary | ICD-10-CM | POA: Insufficient documentation

## 2013-06-01 DIAGNOSIS — Z8742 Personal history of other diseases of the female genital tract: Secondary | ICD-10-CM | POA: Insufficient documentation

## 2013-06-01 LAB — COMPREHENSIVE METABOLIC PANEL
AST: 22 U/L (ref 0–37)
Albumin: 3.4 g/dL — ABNORMAL LOW (ref 3.5–5.2)
Chloride: 101 mEq/L (ref 96–112)
Creatinine, Ser: 0.7 mg/dL (ref 0.50–1.10)
Total Bilirubin: 0.5 mg/dL (ref 0.3–1.2)

## 2013-06-01 LAB — CBC WITH DIFFERENTIAL/PLATELET
Basophils Absolute: 0.1 10*3/uL (ref 0.0–0.1)
Basophils Relative: 0 % (ref 0–1)
HCT: 38.4 % (ref 36.0–46.0)
MCHC: 34.4 g/dL (ref 30.0–36.0)
Monocytes Absolute: 0.9 10*3/uL (ref 0.1–1.0)
Neutro Abs: 8.3 10*3/uL — ABNORMAL HIGH (ref 1.7–7.7)
Neutrophils Relative %: 66 % (ref 43–77)
Platelets: 525 10*3/uL — ABNORMAL HIGH (ref 150–400)
RDW: 13.9 % (ref 11.5–15.5)

## 2013-06-01 LAB — POCT I-STAT TROPONIN I: Troponin i, poc: 0.01 ng/mL (ref 0.00–0.08)

## 2013-06-01 MED ORDER — SODIUM CHLORIDE 0.9 % IV BOLUS (SEPSIS)
500.0000 mL | Freq: Once | INTRAVENOUS | Status: AC
  Administered 2013-06-01: 500 mL via INTRAVENOUS

## 2013-06-01 MED ORDER — ALBUTEROL SULFATE (5 MG/ML) 0.5% IN NEBU
5.0000 mg | INHALATION_SOLUTION | Freq: Once | RESPIRATORY_TRACT | Status: AC
  Administered 2013-06-01: 5 mg via RESPIRATORY_TRACT
  Filled 2013-06-01: qty 1

## 2013-06-01 MED ORDER — ALBUTEROL SULFATE HFA 108 (90 BASE) MCG/ACT IN AERS
2.0000 | INHALATION_SPRAY | RESPIRATORY_TRACT | Status: DC | PRN
  Filled 2013-06-01: qty 6.7

## 2013-06-01 MED ORDER — IOHEXOL 350 MG/ML SOLN
100.0000 mL | Freq: Once | INTRAVENOUS | Status: AC | PRN
  Administered 2013-06-01: 100 mL via INTRAVENOUS

## 2013-06-01 NOTE — ED Notes (Addendum)
Pt c/o difficulty breathing and chest pain. Pt states she had cosmetic surgery last Thursday and has been having difficulty since. Pt rates pain 5/10. States when she doesn't talk or walk the pain is not there. Pt states she also had a fever and was put on antibiotics. Pt complains of rt sided chest pain that radiates to rt arm.

## 2013-06-01 NOTE — Telephone Encounter (Signed)
I think she should go to the ER. She could have pneumonia, etc. She needs Xrays and tests that we cannot do here

## 2013-06-01 NOTE — ED Notes (Signed)
States has surgery last Thursday  Lipo and abdminoplasty started to have sob since then and now having cp this weekend and having a fever

## 2013-06-01 NOTE — Telephone Encounter (Signed)
Spoke with patient and she has verbally agreed to go to the ER.

## 2013-06-01 NOTE — ED Notes (Signed)
Dr. Delo at bedside. 

## 2013-06-01 NOTE — Telephone Encounter (Signed)
Patient Information:  Caller Name: [name]  Phone: [phone]  Patient: [name], [name]  Gender: Female  DOB: [birth year]  Age: 52 Years  PCP: Gershon Crane [facility])  Pregnant: No  Office Follow Up:  Does the office need to follow up with this patient?: Yes  Instructions For The Office: OFFICE, can pt be worked into Dr Clent Ridges schedule today soon for symptoms of Shortness of breath.  Only appt found was a 1600 and RN felt pt needed to be seen sooner  RN Note:  pt did not want to call 911 or to go to the ER  Symptoms  Reason For Call & Symptoms: pt is complaining about shortness of breath.  Pt had surgery done on 05/21/13 (plastic surgery done).  Pt denies any swelling on legs or ankles.  Pt is taking an antibiotic for fever that she had 2 days  ago.  Reviewed Health History In EMR: Yes  Reviewed Medications In EMR: Yes  Reviewed Allergies In EMR: Yes  Reviewed Surgeries / Procedures: Yes  Date of Onset of Symptoms: 06/01/2013 OB / GYN:  LMP: 05/25/2013  Guideline(s) Used:  Breathing Difficulty  Disposition Per Guideline:   Go to ED Now  Reason For Disposition Reached:   Major surgery in the past month  Advice Given:  N/A  Patient Refused Recommendation:  Patient Refused Care Advice  pt requests appt to see Dr Clent Ridges

## 2013-06-01 NOTE — ED Provider Notes (Signed)
History     CSN: [id number]  Arrival date & time 06/01/13  1312   First MD Initiated Contact with Patient 06/01/13 1348      Chief Complaint  Patient presents with  . Chest Pain  . Shortness of Breath    (Consider location/radiation/quality/duration/timing/severity/associated sxs/prior treatment) HPI Comments: Patient underwent "tummy tuck" and liposuction 11 days ago in [place].  Started several days ago feeling short of breath and discomfort across the front of her chest.  She reports a low grade fever at home.  She spoke with her surgeon and was started on an antibiotic she does not recall the name.  She reports a slightly productive cough.  Patient is a 52 y.o. female presenting with chest pain and shortness of breath. The history is provided by the patient.  Chest Pain Chest pain location: right anterior chest wall. Pain quality: tightness   Pain radiates to:  Does not radiate Pain radiates to the back: no   Pain severity:  Moderate Onset quality:  Gradual Duration:  7 days Timing:  Constant Progression:  Worsening Chronicity:  New Context: breathing and movement   Relieved by:  Nothing Worsened by:  Nothing tried Ineffective treatments:  None tried Associated symptoms: shortness of breath   Shortness of Breath Associated symptoms: chest pain     Past Medical History  Diagnosis Date  . Depression   . Hypertension   . Genital warts   . Cardiac arrhythmia   . Heart murmur   . Fibroids     uterine sees Dr. Neva Seat in [place]   . Low back pain     has a herniated lumbar disc sees Dr August Saucer  . Allergy     Dr Stevphen Rochester  . Irritable bowel syndrome (IBS)     Past Surgical History  Procedure Laterality Date  . Tubal ligation    . Myomectomy      laser ablation of uterine fibroids per Dr Dareen Piano 2000  . Lumbar laminectomy      L3-4 on 02-24-10 per Dr Otelia Sergeant     Family History  Problem Relation Age of Onset  . Arthritis      Family Hx  . Diabetes     Family Hx 1st degree relative  . Hyperlipidemia      Family Hx  . Hypertension      Family Hx  . Prostate cancer      Family Hx 1st Degree relative <50  . Stroke      First Degree Female <50    History  Substance Use Topics  . Smoking status: Former Games developer  . Smokeless tobacco: Never Used  . Alcohol Use: No    OB History   Grav Para Term Preterm Abortions TAB SAB Ect Mult Living                  Review of Systems  Respiratory: Positive for shortness of breath.   Cardiovascular: Positive for chest pain.  All other systems reviewed and are negative.    Allergies  Hctz; Peanut-containing drug products; Ciprofloxacin; and Shellfish-derived products  Home Medications   Current Outpatient Rx  Name  Route  Sig  Dispense  Refill  . albuterol (PROVENTIL HFA;VENTOLIN HFA) 108 (90 BASE) MCG/ACT inhaler   Inhalation   Inhale 2 puffs into the lungs every 4 (four) hours as needed for wheezing or shortness of breath.   3 Inhaler   3   . amLODipine (NORVASC) 10 MG tablet   Oral  Take 10 mg by mouth daily.         . beclomethasone (QVAR) 80 MCG/ACT inhaler   Inhalation   Inhale 1 puff into the lungs 2 (two) times daily as needed (for wheezing).   3 Inhaler   3   . cholecalciferol (VITAMIN D) 1000 UNITS tablet   Oral   Take 2,000 Units by mouth daily.         . diclofenac (VOLTAREN) 75 MG EC tablet   Oral   Take 1 tablet (75 mg total) by mouth 2 (two) times daily.   60 tablet   11   . DULoxetine (CYMBALTA) 30 MG capsule   Oral   Take 1 capsule (30 mg total) by mouth daily.   90 capsule   3   . EPINEPHrine (EPI-PEN) 0.3 mg/0.3 mL DEVI   Intramuscular   Inject 0.3 mg into the muscle once.         . fish oil-omega-3 fatty acids 1000 MG capsule   Oral   Take 2 g by mouth daily.         Marland Kitchen HYDROcodone-acetaminophen (NORCO) 10-325 MG per tablet   Oral   Take 1 tablet by mouth every 6 (six) hours as needed for pain.   120 tablet   5   . lisinopril  (PRINIVIL,ZESTRIL) 20 MG tablet   Oral   Take 20 mg by mouth daily.         . methocarbamol (ROBAXIN) 750 MG tablet   Oral   Take 1 tablet (750 mg total) by mouth 3 (three) times daily as needed (for muscle relaxer).   270 tablet   1   . Multiple Vitamin (MULTIVITAMIN WITH MINERALS) TABS   Oral   Take 1 tablet by mouth daily.           BP 122/86  Pulse 50  Resp 17  SpO2 97%  Physical Exam  Nursing note and vitals reviewed. Constitutional: She is oriented to person, place, and time. She appears well-developed and well-nourished. No distress.  HENT:  Head: Normocephalic and atraumatic.  Neck: Normal range of motion. Neck supple.  Cardiovascular: Normal rate and regular rhythm.  Exam reveals no gallop and no friction rub.   No murmur heard. Pulmonary/Chest: Effort normal and breath sounds normal. No respiratory distress. She has no wheezes. She exhibits no tenderness.  Abdominal: Soft. Bowel sounds are normal. She exhibits no distension. There is no tenderness.  The incision site across the lower abdomen is noted to have a dressing in place with clear op-site.  There is very mild erythema along the incision line but no warmth or purulent drainage.  Musculoskeletal: Normal range of motion.  Neurological: She is alert and oriented to person, place, and time.  Skin: Skin is warm and dry. She is not diaphoretic.    ED Course  Procedures (including critical care time)  Labs Reviewed  CBC WITH DIFFERENTIAL  COMPREHENSIVE METABOLIC PANEL  D-DIMER, QUANTITATIVE   No results found.   No diagnosis found.   Date: 06/01/2013  Rate: 98  Rhythm: normal sinus rhythm with frequent pvcs  QRS Axis: normal  Intervals: normal  ST/T Wave abnormalities: normal  Conduction Disutrbances:none  Narrative Interpretation:   Old EKG Reviewed: unchanged    MDM  The patient presents with complaints of shortness of breath 10 days status post abdominoplasty.  The workup is negative for  pe and there is no evidence for pneumonia or other pathology.  She is in a  sinus rhythm and appears well.  Her oxygen saturations are adequate and I feel as though she is stable for discharge with an mdi in case she is having a flareup of RAD.  She was given a neb treatment in the ED.  I have spoken with her surgeon from [place] who is in agreement with the workup and treatment plan.          Geoffery Lyons, MD 06/01/13 [phone]

## 2013-08-12 ENCOUNTER — Telehealth: Payer: Self-pay | Admitting: Family Medicine
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
## 2013-08-12 NOTE — Telephone Encounter (Signed)
Patient request an appointment on Friday.  Appointment made with Dr Clent Ridges.

## 2013-08-14 ENCOUNTER — Encounter: Payer: Self-pay | Admitting: Family Medicine

## 2013-08-14 ENCOUNTER — Ambulatory Visit (INDEPENDENT_AMBULATORY_CARE_PROVIDER_SITE_OTHER): Payer: Self-pay | Admitting: Family Medicine

## 2013-08-14 VITALS — BP 142/86 | HR 67 | Temp 97.7°F | Wt 178.0 lb

## 2013-08-14 DIAGNOSIS — S8010XA Contusion of unspecified lower leg, initial encounter: Secondary | ICD-10-CM

## 2013-08-14 DIAGNOSIS — J45909 Unspecified asthma, uncomplicated: Secondary | ICD-10-CM | POA: Insufficient documentation

## 2013-08-14 DIAGNOSIS — S8012XA Contusion of left lower leg, initial encounter: Secondary | ICD-10-CM

## 2013-08-14 DIAGNOSIS — I10 Essential (primary) hypertension: Secondary | ICD-10-CM

## 2013-08-14 DIAGNOSIS — R609 Edema, unspecified: Secondary | ICD-10-CM

## 2013-08-14 DIAGNOSIS — R600 Localized edema: Secondary | ICD-10-CM

## 2013-08-14 MED ORDER — FUROSEMIDE 20 MG PO TABS: 20.0000 mg | ORAL_TABLET | Freq: Every day | ORAL | Status: DC

## 2013-08-14 MED ORDER — BECLOMETHASONE DIPROPIONATE 80 MCG/ACT IN AERS: 2.0000 | INHALATION_SPRAY | Freq: Two times a day (BID) | RESPIRATORY_TRACT | Status: DC

## 2013-08-14 MED ORDER — ALBUTEROL SULFATE HFA 108 (90 BASE) MCG/ACT IN AERS: 2.0000 | INHALATION_SPRAY | RESPIRATORY_TRACT | Status: DC | PRN
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
## 2013-12-28 ENCOUNTER — Other Ambulatory Visit: Payer: Self-pay | Admitting: Family Medicine

## 2014-03-05 ENCOUNTER — Encounter: Payer: Self-pay | Admitting: Family Medicine

## 2014-03-05 ENCOUNTER — Ambulatory Visit (INDEPENDENT_AMBULATORY_CARE_PROVIDER_SITE_OTHER): Payer: Self-pay | Admitting: Family Medicine

## 2014-03-05 VITALS — BP 120/80 | HR 66 | Temp 98.5°F

## 2014-03-05 DIAGNOSIS — G4733 Obstructive sleep apnea (adult) (pediatric): Secondary | ICD-10-CM

## 2014-03-05 MED ORDER — EPINEPHRINE 0.3 MG/0.3ML IJ SOAJ: 0.3000 mg | Freq: Once | INTRAMUSCULAR | Status: DC

## 2014-03-05 NOTE — Progress Notes (Signed)
Subjective:    Patient ID: [name], female    DOB: [birth year], 53 y.o.   MRN: [id number]  HPI Here complaining of increased fatigue and daytime sleepiness for the past year. She has always been a loud snorer, and lately wakes herself up at night feeling like she can't breathe. Her asthma has been quite stable.   Review of Systems  Constitutional: Negative.   Respiratory: Negative.   Cardiovascular: Negative.        Objective:   Physical Exam  Constitutional: She appears well-developed and well-nourished.  Cardiovascular: Normal rate, regular rhythm, normal heart sounds and intact distal pulses.   Pulmonary/Chest: Effort normal and breath sounds normal.          Assessment & Plan:  Probable sleep apnea. Refer to pulmonary

## 2014-03-05 NOTE — Progress Notes (Signed)
Pre visit review using our clinic review tool, if applicable. No additional management support is needed unless otherwise documented below in the visit note. 

## 2014-03-31 ENCOUNTER — Ambulatory Visit (INDEPENDENT_AMBULATORY_CARE_PROVIDER_SITE_OTHER): Payer: Self-pay | Admitting: Pulmonary Disease

## 2014-03-31 ENCOUNTER — Encounter: Payer: Self-pay | Admitting: Pulmonary Disease

## 2014-03-31 VITALS — BP 134/90 | HR 65 | Temp 96.8°F | Ht 63.25 in | Wt 189.0 lb

## 2014-03-31 DIAGNOSIS — G4733 Obstructive sleep apnea (adult) (pediatric): Secondary | ICD-10-CM

## 2014-03-31 NOTE — Assessment & Plan Note (Signed)
The patient's history is very suspicious for clinically significant sleep apnea. It had a long discussion with her about the pathophysiology of sleep disordered breathing, including its impact to her quality of life and cardiovascular health. The patient will need to have a sleep study for diagnosis, and I think she is an excellent candidate for home sleep testing. The patient is agreeable to this approach.

## 2014-03-31 NOTE — Patient Instructions (Signed)
Will arrange for home sleep testing, and will call once results are available Work on weight loss.  

## 2014-03-31 NOTE — Progress Notes (Signed)
Subjective:    Patient ID: [name], female    DOB: 1961/07/29, 53 y.o.   MRN: [id number]  HPI The patient is a 53 year old female who I've been asked to see for possible obstructive sleep apnea. She has been noted to have loud snoring, as well as an abnormal breathing pattern during sleep. She also has choking arousals at times, and notes frequent awakenings during the night. She is not rested in the mornings upon arising, and has significant daytime sleepiness with periods of inactivity. She falls asleep easily watching television or movies, and also notes sleep pressure with driving. The patient states that her weight is up 40 pounds over the last 2 years, and her Epworth score today is 23.   Sleep Questionnaire What time do you typically go to bed?( Between what hours) 8-9pm 8-9pm at 1343 on 03/31/14 by Maisie Fus, CMA How long does it take you to fall asleep? 1hr 1hr at 1343 on 03/31/14 by Maisie Fus, CMA How many times during the night do you wake up? 5 5 at 1343 on 03/31/14 by Maisie Fus, CMA What time do you get out of bed to start your day? 0600 0600 at 1343 on 03/31/14 by Maisie Fus, CMA Do you drive or operate heavy machinery in your occupation? No No at 1343 on 03/31/14 by Maisie Fus, CMA How much has your weight changed (up or down) over the past two years? (In pounds) 30 lb (13.608 kg) 30 lb (13.608 kg) at 1343 on 03/31/14 by Maisie Fus, CMA Have you ever had a sleep study before? No No at 1343 on 03/31/14 by Maisie Fus, CMA Do you currently use CPAP? No No at 1343 on 03/31/14 by Maisie Fus, CMA Do you wear oxygen at any time? No No at 1343 on 03/31/14 by Maisie Fus, CMA   Review of Systems  Constitutional: Positive for unexpected weight change. Negative for fever.  HENT: Positive for postnasal drip, sneezing and sore throat. Negative for congestion, dental problem, ear pain, nosebleeds, rhinorrhea, sinus pressure and  trouble swallowing.   Eyes: Negative for redness and itching.  Respiratory: Positive for shortness of breath. Negative for cough, chest tightness and wheezing.   Cardiovascular: Positive for leg swelling ( feet). Negative for palpitations.  Gastrointestinal: Negative for nausea and vomiting.       Acid heartburn//indigestion  Genitourinary: Negative for dysuria.  Musculoskeletal: Positive for joint swelling.  Skin: Negative for rash.  Neurological: Negative for headaches.  Hematological: Does not bruise/bleed easily.  Psychiatric/Behavioral: Positive for dysphoric mood. The patient is not nervous/anxious.        Objective:   Physical Exam Constitutional:  Overweight female, no acute distress  HENT:  Nares patent without discharge, mild turbinate hypertrophy  Oropharynx without exudate, palate and uvula are moderately elongated  Eyes:  Perrla, eomi, no scleral icterus  Neck:  No JVD, no TMG  Cardiovascular:  Normal rate, regular rhythm, no rubs or gallops.  No murmurs        Intact distal pulses  Pulmonary :  Normal breath sounds, no stridor or respiratory distress   No rales, rhonchi, or wheezing  Abdominal:  Soft, nondistended, bowel sounds present.  No tenderness noted.   Musculoskeletal:  minimal lower extremity edema noted.  Lymph Nodes:  No cervical lymphadenopathy noted  Skin:  No cyanosis noted  Neurologic:  Alert, appropriate, moves all 4 extremities without obvious deficit.  Assessment & Plan:

## 2014-04-02 ENCOUNTER — Telehealth: Payer: Self-pay | Admitting: Pulmonary Disease

## 2014-04-02 NOTE — Telephone Encounter (Signed)
Called made pt aware of results. She voiced her understanding

## 2014-04-02 NOTE — Telephone Encounter (Signed)
Spoke with the pt  She is requesting home sleep study results  She is concerned that the test might not be accurate b/c she started out to sleep propped up, and then the second part of the study she was lying flat  She reports that when she lies flat she notices more symptoms  Please advise, thanks

## 2014-04-02 NOTE — Telephone Encounter (Signed)
I just got the download this am (off yesterday afternoon) Will read this weekend or first thing Monday.

## 2014-04-05 NOTE — Telephone Encounter (Signed)
Patient is calling for results of sleep test.  [phone]

## 2014-04-06 ENCOUNTER — Encounter: Payer: Self-pay | Admitting: Pulmonary Disease

## 2014-04-06 ENCOUNTER — Ambulatory Visit (INDEPENDENT_AMBULATORY_CARE_PROVIDER_SITE_OTHER): Payer: Self-pay | Admitting: Pulmonary Disease

## 2014-04-06 ENCOUNTER — Encounter (INDEPENDENT_AMBULATORY_CARE_PROVIDER_SITE_OTHER): Payer: Self-pay

## 2014-04-06 VITALS — BP 128/84 | HR 74 | Temp 97.9°F | Ht 63.0 in | Wt 183.2 lb

## 2014-04-06 DIAGNOSIS — G4710 Hypersomnia, unspecified: Secondary | ICD-10-CM

## 2014-04-06 DIAGNOSIS — G4733 Obstructive sleep apnea (adult) (pediatric): Secondary | ICD-10-CM

## 2014-04-06 DIAGNOSIS — G4730 Sleep apnea, unspecified: Secondary | ICD-10-CM

## 2014-04-06 NOTE — Patient Instructions (Signed)
Work on weight loss and positional therapy as we discussed. Please call if you wish to treat your sleep apnea more aggressively with cpap, or if you are not making headway with weight loss.

## 2014-04-06 NOTE — Telephone Encounter (Signed)
Called and spoke to pt. Pt awaiting sleep study results. Advised pt KC is reviewing the study and we should be contacting her soon with the results.  [place] please advise.

## 2014-04-06 NOTE — Telephone Encounter (Signed)
Called and spoke to pt. Pt is scheduled for a 1:45pm appointment with [facility] to review sleep study. Pt aware of time and location of appointment.

## 2014-04-06 NOTE — Telephone Encounter (Signed)
Let pt know she needs an ov for [place] to review her study.  See if we can get her in soon since she is obviously very anxious about this.  Get with ashtyn if needed.

## 2014-04-06 NOTE — Progress Notes (Signed)
Subjective:    Patient ID: [name], female    DOB: [birth year], 53 y.o.   MRN: [id number]  HPI The patient comes in today for followup of her recent home sleep test. She was found to have minimal OSA, with an AHI of 5.1 events per hour. I have reviewed the study with her in detail, and answered all of her questions   Review of Systems  Constitutional: Negative for fever and unexpected weight change.  HENT: Positive for sneezing. Negative for congestion, dental problem, ear pain, nosebleeds, postnasal drip, rhinorrhea, sinus pressure, sore throat and trouble swallowing.   Eyes: Negative for redness and itching.  Respiratory: Positive for shortness of breath. Negative for cough, chest tightness and wheezing.   Cardiovascular: Positive for palpitations and leg swelling.  Gastrointestinal: Negative for nausea and vomiting.  Genitourinary: Negative for dysuria.  Musculoskeletal: Negative for joint swelling.  Skin: Negative for rash.  Neurological: Negative for headaches.  Hematological: Does not bruise/bleed easily.  Psychiatric/Behavioral: Positive for dysphoric mood. The patient is not nervous/anxious.        Objective:   Physical Exam Overweight female in no acute distress Nose without purulence or discharge noted Neck without lymphadenopathy or thyromegaly Lower extremities without significant edema, no cyanosis Alert and oriented, moves all 4 extremities        Assessment & Plan:

## 2014-04-06 NOTE — Assessment & Plan Note (Signed)
The patient's home sleep study shows very mild OSA, and I have reviewed the results with her in detail. I have explained to her this is not a health risk for her, and the decision to treat this aggressively should be based on its impact to her cardiovascular health.  I have outlined a conservative approach with a trial of aggressive weight loss and positional therapy, as well as a more aggressive approach with either a dental appliance or CPAP. After a long discussion, the patient would like to take the next 6 months to work on weight loss and positional therapy. She understands that she can call to start more aggressive treatments any time in the future.

## 2014-05-04 ENCOUNTER — Other Ambulatory Visit: Payer: Self-pay | Admitting: Family Medicine

## 2014-05-13 ENCOUNTER — Ambulatory Visit (INDEPENDENT_AMBULATORY_CARE_PROVIDER_SITE_OTHER): Payer: Self-pay | Admitting: Physician Assistant

## 2014-05-13 ENCOUNTER — Telehealth: Payer: Self-pay

## 2014-05-13 ENCOUNTER — Encounter: Payer: Self-pay | Admitting: Physician Assistant

## 2014-05-13 VITALS — BP 124/80 | HR 86 | Temp 98.1°F | Resp 18 | Wt 195.0 lb

## 2014-05-13 DIAGNOSIS — W57XXXA Bitten or stung by nonvenomous insect and other nonvenomous arthropods, initial encounter: Secondary | ICD-10-CM

## 2014-05-13 DIAGNOSIS — A6920 Lyme disease, unspecified: Secondary | ICD-10-CM

## 2014-05-13 DIAGNOSIS — T148 Other injury of unspecified body region: Secondary | ICD-10-CM

## 2014-05-13 DIAGNOSIS — R0789 Other chest pain: Secondary | ICD-10-CM

## 2014-05-13 MED ORDER — DOXYCYCLINE HYCLATE 100 MG PO TABS: 100.0000 mg | ORAL_TABLET | Freq: Two times a day (BID) | ORAL | Status: DC

## 2014-05-13 NOTE — Progress Notes (Signed)
Pre visit review using our clinic review tool, if applicable. No additional management support is needed unless otherwise documented below in the visit note. 

## 2014-05-13 NOTE — Progress Notes (Signed)
Subjective:    Patient ID: [name], female    DOB: [birth year], 53 y.o.   MRN: [id number]  HPI Patient is a 53 year old African American female presenting to the clinic for a tick bite. Patient states that she removed a tick this morning from her left thigh. She states that a tick had actually been there for approximately one week, and that prior to this morning she actually thought that it was a blackhead that had gotten increasingly red and had not mess with it. This morning she saw legs and pulled the tick off. She states that since this morning the redness has increased, and it is slightly painful to the touch. She states that for the past week she has also been feeling sick, stating she has had fever, nausea, fatigue, itching all over, shortness of breath, and an episode of chest pain. The chest pain occurred a few days ago while sitting at rest, it was a sharp left sided pain, had some radiation into her left arm, lasted for a few minutes and it was variable during that timeframe. She has not had any episodes of chest pain since this. Her only episode of shortness of breath occurred last night, however this seemed to be more related to allergies, and only lasted a few minutes. She denies chills, sweats, vomiting, diarrhea, and dizziness. She is currently asymptomatic.   Review of Systems As per the history of present illness and otherwise negative.  Past Medical History  Diagnosis Date  . Depression   . Hypertension   . Genital warts   . Cardiac arrhythmia   . Heart murmur   . Fibroids     uterine sees Dr. Neva Seat in [place]   . Low back pain     has a herniated lumbar disc sees Dr August Saucer  . Allergy     Dr Stevphen [place]  . Irritable bowel syndrome (IBS)    Past Surgical History  Procedure Laterality Date  . Tubal ligation    . Myomectomy      laser ablation of uterine fibroids per Dr Dareen Piano 2000  . Lumbar laminectomy      L3-4 on 02-24-10 per Dr Otelia Sergeant   .  Abdominoplasty  04/2013    reports that she quit smoking about 17 years ago. Her smoking use included Cigarettes. She has a .5 pack-year smoking history. She has never used smokeless tobacco. She reports that she drinks alcohol. She reports that she does not use illicit drugs. family history includes Arthritis in an other family member; Diabetes in an other family member; Hyperlipidemia in an other family member; Hypertension in an other family member; Prostate cancer in an other family member; Stroke in an other family member. Allergies  Allergen Reactions  . Hctz [Hydrochlorothiazide] Other (See Comments)    Blood pressure goes up  . Peanut-Containing Drug Products Anaphylaxis  . Ciprofloxacin Diarrhea  . Shellfish-Derived Products Other (See Comments)     Per allergy test   The PFS history was reviewed with the pt at the time of visit.    Objective:   Physical Exam  Nursing note and vitals reviewed. Constitutional: She is oriented to person, place, and time. She appears well-developed and well-nourished. No distress.  HENT:  Head: Normocephalic and atraumatic.  Eyes: Conjunctivae and EOM are normal. Pupils are equal, round, and reactive to light.  Neck: Normal range of motion. Neck supple. No JVD present.  Cardiovascular: Normal rate, regular rhythm, normal heart sounds and intact distal  pulses.  Exam reveals no gallop and no friction rub.   No murmur heard. Pulmonary/Chest: Effort normal and breath sounds normal. No stridor. No respiratory distress. She has no wheezes. She has no rales. She exhibits no tenderness.  Abdominal: Soft. Bowel sounds are normal. There is no tenderness.  Musculoskeletal: Normal range of motion.  Lymphadenopathy:    She has no cervical adenopathy.  Neurological: She is alert and oriented to person, place, and time.  Skin: Skin is warm and dry. No rash noted. She is not diaphoretic. There is erythema. No pallor.  Left lateral thigh: There is a 1-1/2-2 cm  diameter erythematous target lesion with central clearing. At the very center of this is a small hematoma. The area is very mildly tender to palpation. No surrounding erythema. No fluctuance.  Psychiatric: She has a normal mood and affect. Her behavior is normal. Judgment and thought content normal.    Filed Vitals:   05/13/14 1420  BP: 124/80  Pulse: 86  Temp: 98.1 F (36.7 C)  Resp: 18   Lab Results  Component Value Date   WBC 12.6* 06/01/2013   HGB 13.2 06/01/2013   HCT 38.4 06/01/2013   PLT 525* 06/01/2013   GLUCOSE 93 06/01/2013   CHOL 178 04/23/2012   TRIG 57.0 04/23/2012   HDL 50.20 04/23/2012   LDLCALC 116* 04/23/2012   ALT 36* 06/01/2013   AST 22 06/01/2013   NA 139 06/01/2013   K 4.0 06/01/2013   CL 101 06/01/2013   CREATININE 0.70 06/01/2013   BUN 12 06/01/2013   CO2 24 06/01/2013   TSH 0.86 03/17/2013   INR 1.1* 03/17/2013   HGBA1C 5.7 06/23/2010   EKG: normal EKG, normal sinus rhythm.    Assessment & Plan:  [name] was seen today for tick bite.  Diagnoses and associated orders for this visit:  Tick bite - doxycycline (VIBRA-TABS) 100 MG tablet; Take 1 tablet (100 mg total) by mouth 2 (two) times daily.  Atypical chest pain - Resolved. No changes on EKG. Will monitor symptoms and reassess in 1 week. - EKG 12-Lead  Erythema migrans (Lyme disease) - Target lesion with central clearing and recent constitutional symptoms and tick exposure for greater than 24 hours. Will treat with doxycycline for 21 days. - doxycycline (VIBRA-TABS) 100 MG tablet; Take 1 tablet (100 mg total) by mouth 2 (two) times daily.  Cautioned patient on sun exposure and increased sensitivity while on doxycycline. Encourage patient to drink plenty of water to avoid irritation from taking doxycycline.  Return precautions provided.  Plan to followup in one week to reassess.  Patient Instructions  Doxycycline twice a day for 21 days. Drink plenty of water. Make also some sensitivity in increased likelihood  of a sunburn.  Monitor the area over the next week. The redness should decrease and resolve.   If the redness increases, swelling increases, pain in the area increased, the are becomes very warm to the touch, or you develop a fever, seek medical treatment immediately.  Monitor your chest pain symptoms. Return to clinic if they become more frequent or worsen.  Followup in one week to reassess or sooner if the above symptoms develop.

## 2014-05-13 NOTE — Patient Instructions (Addendum)
Doxycycline twice a day for 21 days. Drink plenty of water. Make also some sensitivity in increased likelihood of a sunburn.  Monitor the area over the next week. The redness should decrease and resolve.   If the redness increases, swelling increases, pain in the area increased, the are becomes very warm to the touch, or you develop a fever, seek medical treatment immediately.  Monitor your chest pain symptoms. Return to clinic if they become more frequent or worsen.  Followup in one week to reassess or sooner if the above symptoms develop.   Tick Bite Information Ticks are insects that attach themselves to the skin. There are many types of ticks. Common types include wood ticks and deer ticks. Sometimes, ticks carry diseases that can make a person very ill. The most common places for ticks to attach themselves are the scalp, neck, armpits, waist, and groin.  HOW CAN YOU PREVENT TICK BITES? Take these steps to help prevent tick bites when you are outdoors:  Wear long sleeves and long pants.  Wear white clothes so you can see ticks more easily.  Tuck your pant legs into your socks.  If walking on a trail, stay in the middle of the trail to avoid brushing against bushes.  Avoid walking through areas with long grass.  Put bug spray on all skin that is showing and along boot tops, pant legs, and sleeve cuffs.  Check clothes, hair, and skin often and before going inside.  Brush off any ticks that are not attached.  Take a shower or bath as soon as possible after being outdoors. HOW SHOULD YOU REMOVE A TICK? Ticks should be removed as soon as possible to help prevent diseases. 1. If latex gloves are available, put them on before trying to remove a tick. 2. Use tweezers to grasp the tick as close to the skin as possible. You may also use curved forceps or a tick removal tool. Grasp the tick as close to its head as possible. Avoid grasping the tick on its body. 3. Pull gently upward  until the tick lets go. Do not twist the tick or jerk it suddenly. This may break off the tick's head or mouth parts. 4. Do not squeeze or crush the tick's body. This could force disease-carrying fluids from the tick into your body. 5. After the tick is removed, wash the bite area and your hands with soap and water or alcohol. 6. Apply a small amount of antiseptic cream or ointment to the bite site. 7. Wash any tools that were used. Do not try to remove a tick by applying a hot match, petroleum jelly, or fingernail polish to the tick. These methods do not work. They may also increase the chances of disease being spread from the tick bite. WHEN SHOULD YOU SEEK HELP? Contact your health care provider if you are unable to remove a tick or if a part of the tick breaks off in the skin. After a tick bite, you need to watch for signs and symptoms of diseases that can be spread by ticks. Contact your health care provider if you develop any of the following:  Fever.  Rash.  Redness and puffiness (swelling) in the area of the tick bite.  Tender, puffy lymph glands.  Watery poop (diarrhea).  Weight loss.  Cough.  Feeling more tired than normal (fatigue).  Muscle, joint, or bone pain.  Belly (abdominal) pain.  Headache.  Change in your level of consciousness.  Trouble walking or moving  your legs.  Loss of feeling (numbness) in the legs.  Loss of movement (paralysis).  Shortness of breath.  Confusion.  Throwing up (vomiting) many times. Document Released: 02/27/2010 Document Revised: 08/05/2013 Document Reviewed: 05/13/2013 [facility] Patient Information 2014 [place].

## 2014-05-13 NOTE — Telephone Encounter (Signed)
Received a call from Kristopher Oppenheim for doxycyline and the cost is 128.00.  Per matthew it should be cheaper at [place] with pt and she is aware.  New rx sent to [facility].

## 2014-05-19 ENCOUNTER — Telehealth: Payer: Self-pay | Admitting: Family Medicine

## 2014-05-19 NOTE — Telephone Encounter (Signed)
Patient Information:  Caller Name: Georgene  Phone: [phone]  Patient: [name], [name]  Gender: Female  DOB: January 26, 1961  Age: 53 Years  PCP: Alysia Penna [facility])  Pregnant: No  Office Follow Up:  Does the office need to follow up with this patient?: Yes  Instructions For The Office: needs a call back about Doxycycline and whether she should continue the med with her sxs  RN Note:  triaged via Nausea or Vomiting protocol in [facility]; disp call provider d/t sxs starting w/i 7 days of starting a med  Symptoms  Reason For Call & Symptoms: c/o sxs since starting Doxycycine 05/13/14 for a tick bite; started with nausea that occurs every time she takes the med and lasts 30-36mins; vomited one time the first time she took the med; c/o heartburn yesterday and today; c/o HA yesterday, but took nothing; c/o dizziness earlier today; afebrile; has had some left sided pain around her left breast; has had some joint discomfort and stiffness  Reviewed Health History In EMR: Yes  Reviewed Medications In EMR: Yes  Reviewed Allergies In EMR: Yes  Reviewed Surgeries / Procedures: Yes  Date of Onset of Symptoms: 05/13/2014 OB / GYN:  LMP: 05/17/2014  Guideline(s) Used:  No Protocol Available - Sick Adult  Disposition Per Guideline:   Discuss with PCP and Callback by Nurse Today  Reason For Disposition Reached:   Nursing judgment  Advice Given:  N/A

## 2014-05-19 NOTE — Telephone Encounter (Signed)
Yes! She needs to continue the Doxy. All of her symptoms except for the N/V were present before the Doxy, and likely due to the Tick bite.  Doxy is the Gold Standard for the treatment of possible tick borne illness, and she needs to take the whole course. She needs to make sure that she takes her morning dose with a Full Breakfast, and her evening dose with a Full Dinner, as taking Doxy on an empty stomach or with light meals very frequently result in N/V. If she is still experiencing N/V even with taking doses with Full Meals, we could try prescribing Phenergan to help relieve the Nausea.

## 2014-05-20 ENCOUNTER — Encounter: Payer: Self-pay | Admitting: Physician Assistant

## 2014-05-20 ENCOUNTER — Ambulatory Visit (INDEPENDENT_AMBULATORY_CARE_PROVIDER_SITE_OTHER): Payer: Self-pay | Admitting: Physician Assistant

## 2014-05-20 VITALS — BP 110/80 | HR 62 | Temp 97.9°F | Resp 18 | Wt 192.0 lb

## 2014-05-20 DIAGNOSIS — W57XXXA Bitten or stung by nonvenomous insect and other nonvenomous arthropods, initial encounter: Secondary | ICD-10-CM

## 2014-05-20 DIAGNOSIS — T148 Other injury of unspecified body region: Secondary | ICD-10-CM

## 2014-05-20 DIAGNOSIS — K219 Gastro-esophageal reflux disease without esophagitis: Secondary | ICD-10-CM

## 2014-05-20 DIAGNOSIS — Z09 Encounter for follow-up examination after completed treatment for conditions other than malignant neoplasm: Secondary | ICD-10-CM

## 2014-05-20 NOTE — Telephone Encounter (Signed)
Pt came into office today for visit; went over information at appt.

## 2014-05-20 NOTE — Patient Instructions (Signed)
Continue to take her doxycycline twice a day. Take your morning dose with a full breakfast, and her evening dose with a full dinner. This should decrease her chances of having nausea and vomiting with doxycycline.  Many of your other symptoms including chest pain and joint pain could be related to the tick borne illness and if so should resolve with appropriate treatment with doxycycline.  Continue taking the Nexium daily for your heartburn symptoms. These should improve greatly with Nexium.  Continue to drink plenty of water to help sooth your throat from the heart burn.  Followup in about one month with your PCP to reassess, or sooner for worsening or persistent symptoms despite treatment.   Gastroesophageal Reflux Disease, Adult Gastroesophageal reflux disease (GERD) happens when acid from your stomach goes into your food pipe (esophagus). The acid can cause a burning feeling in your chest. Over time, the acid can make small holes (ulcers) in your food pipe.  HOME CARE  Ask your doctor for advice about:  Losing weight.  Quitting smoking.  Alcohol use.  Avoid foods and drinks that make your problems worse. You may want to avoid:  Caffeine and alcohol.  Chocolate.  Mints.  Garlic and onions.  Spicy foods.  Citrus fruits, such as oranges, lemons, or limes.  Foods that contain tomato, such as sauce, chili, salsa, and pizza.  Fried and fatty foods.  Avoid lying down for 3 hours before you go to bed or before you take a nap.  Eat small meals often, instead of large meals.  Wear loose-fitting clothing. Do not wear anything tight around your waist.  Raise (elevate) the head of your bed 6 to 8 inches with wood blocks. Using extra pillows does not help.  Only take medicines as told by your doctor.  Do not take aspirin or ibuprofen. GET HELP RIGHT AWAY IF:   You have pain in your arms, neck, jaw, teeth, or back.  Your pain gets worse or changes.  You feel sick to  your stomach (nauseous), throw up (vomit), or sweat (diaphoresis).  You feel short of breath, or you pass out (faint).  Your throw up is green, yellow, black, or looks like coffee grounds or blood.  Your poop (stool) is red, bloody, or black. MAKE SURE YOU:   Understand these instructions.  Will watch your condition.  Will get help right away if you are not doing well or get worse. Document Released: 05/21/2008 Document Revised: 02/25/2012 Document Reviewed: 06/22/2011 [facility] Patient Information 2014 [place], [place].

## 2014-05-20 NOTE — Progress Notes (Signed)
Subjective:    Patient ID: [name], female    DOB: 1961/03/20, 53 y.o.   MRN: [id number]  HPI The patient is a 53 year old African American female presenting for followup of a tick bite. The patient was diagnosed with probable Lyme disease and prescribed course of doxycycline for 21 days. The patient has recently had increased nausea and vomiting and an increase in her GERD symptoms while taking the doxycycline. She had not been taking the doxycycline on full stomach. The patient went to a pharmacy last night and the pharmacist told her to make sure she takes the doxycycline on full stomach. She did this last night for dinner and states that she did not experience any nausea or vomiting after taking the doxycycline dose. The pharmacist also recommended that she pick up Nexium to take for her increased reflux. She has only had one dose of this. She states that she has also been having a lot of throat irritation and an occasional cough from the throat irritation which she believes is related to her reflux. She also still admits having joint pain, headache, and atypical chest pain on occasion. She was worked up for her chest pain with a normal EKG last week and was told to monitor her symptoms for worsening symptoms and provided return precautions. She states that her tick bite lesion is improving, she has not noticed it the past week and it has not bothered her. She denies fevers, chills, diarrhea, shortness of breath.   Review of Systems As per history of present illness and otherwise negative.  Past Medical History  Diagnosis Date  . Depression   . Hypertension   . Genital warts   . Cardiac arrhythmia   . Heart murmur   . Fibroids     uterine sees Dr. Neva Seat in [place]   . Low back pain     has a herniated lumbar disc sees Dr August Saucer  . Allergy     Dr Stevphen Rochester  . Irritable bowel syndrome (IBS)    Past Surgical History  Procedure Laterality Date  . Tubal ligation    .  Myomectomy      laser ablation of uterine fibroids per Dr Dareen Piano 2000  . Lumbar laminectomy      L3-4 on 02-24-10 per Dr Otelia Sergeant   . Abdominoplasty  04/2013    reports that she quit smoking about 17 years ago. Her smoking use included Cigarettes. She has a .5 pack-year smoking history. She has never used smokeless tobacco. She reports that she drinks alcohol. She reports that she does not use illicit drugs. family history includes Arthritis in an other family member; Diabetes in an other family member; Hyperlipidemia in an other family member; Hypertension in an other family member; Prostate cancer in an other family member; Stroke in an other family member. Allergies  Allergen Reactions  . Hctz [Hydrochlorothiazide] Other (See Comments)    Blood pressure goes up  . Peanut-Containing Drug Products Anaphylaxis  . Ciprofloxacin Diarrhea  . Shellfish-Derived Products Other (See Comments)     Per allergy test       Objective:   Physical Exam  Nursing note and vitals reviewed. Constitutional: She is oriented to person, place, and time. She appears well-developed and well-nourished. No distress.  HENT:  Head: Normocephalic and atraumatic.  Mouth/Throat: Oropharynx is clear and moist. No oropharyngeal exudate.  Eyes: Conjunctivae and EOM are normal. Pupils are equal, round, and reactive to light.  Neck: Normal range of motion.  Neck supple. No JVD present.  Cardiovascular: Normal rate, regular rhythm, normal heart sounds and intact distal pulses.  Exam reveals no gallop and no friction rub.   No murmur heard. Pulmonary/Chest: Effort normal and breath sounds normal. No stridor. No respiratory distress. She has no wheezes. She has no rales. She exhibits no tenderness.  Musculoskeletal: Normal range of motion.  Lymphadenopathy:    She has no cervical adenopathy.  Neurological: She is alert and oriented to person, place, and time.  Skin: Skin is warm and dry. No rash noted. She is not  diaphoretic. No erythema. No pallor.  Psychiatric: She has a normal mood and affect. Her behavior is normal. Judgment and thought content normal.   Filed Vitals:   05/20/14 0832  BP: 110/80  Pulse: 62  Temp: 97.9 F (36.6 C)  Resp: 18    Lab Results  Component Value Date   WBC 12.6* 06/01/2013   HGB 13.2 06/01/2013   HCT 38.4 06/01/2013   PLT 525* 06/01/2013   GLUCOSE 93 06/01/2013   CHOL 178 04/23/2012   TRIG 57.0 04/23/2012   HDL 50.20 04/23/2012   LDLCALC 116* 04/23/2012   ALT 36* 06/01/2013   AST 22 06/01/2013   NA 139 06/01/2013   K 4.0 06/01/2013   CL 101 06/01/2013   CREATININE 0.70 06/01/2013   BUN 12 06/01/2013   CO2 24 06/01/2013   TSH 0.86 03/17/2013   INR 1.1* 03/17/2013   HGBA1C 5.7 06/23/2010       Assessment & Plan:  [name] was seen today for follow-up.  Diagnoses and associated orders for this visit:  Follow up Comments: Patient developed severe nausea and vomiting and increased GERD with doxycycline on into stomach. Patient educated to take on full stomach and take PPI.  Tick bite Comments: The lesion is improving. Still experiencing joint pain, atypical chest pain. Will continue doxycycline.  GERD (gastroesophageal reflux disease) Comments: Increase GERD likely related to taking doxycycline. Patient will continue to use Nexium.   Patient will continue to monitor chest pain for worsening or new symptoms, and return precautions were provided again.  Plan to followup in 4 weeks with PCP, or sooner for worsening or persistent symptoms despite treatment.  Patient Instructions  Continue to take her doxycycline twice a day. Take your morning dose with a full breakfast, and her evening dose with a full dinner. This should decrease her chances of having nausea and vomiting with doxycycline.  Many of your other symptoms including chest pain and joint pain could be related to the tick borne illness and if so should resolve with appropriate treatment with  doxycycline.  Continue taking the Nexium daily for your heartburn symptoms. These should improve greatly with Nexium.  Continue to drink plenty of water to help sooth your throat from the heart burn.  Followup in about one month with your PCP to reassess, or sooner for worsening or persistent symptoms despite treatment.

## 2014-05-20 NOTE — Progress Notes (Signed)
Pre visit review using our clinic review tool, if applicable. No additional management support is needed unless otherwise documented below in the visit note. 

## 2014-06-07 ENCOUNTER — Other Ambulatory Visit: Payer: Self-pay | Admitting: Family Medicine

## 2014-06-07 DIAGNOSIS — Z1231 Encounter for screening mammogram for malignant neoplasm of breast: Secondary | ICD-10-CM

## 2014-06-09 ENCOUNTER — Ambulatory Visit (HOSPITAL_COMMUNITY)
Admission: RE | Admit: 2014-06-09 | Discharge: 2014-06-09 | Disposition: A | Discharge status: Home / Self Care | Payer: No Typology Code available for payment source | Source: Ambulatory Visit | Attending: Family Medicine | Admitting: Family Medicine

## 2014-06-09 DIAGNOSIS — Z1231 Encounter for screening mammogram for malignant neoplasm of breast: Secondary | ICD-10-CM

## 2014-06-10 ENCOUNTER — Other Ambulatory Visit: Payer: Self-pay | Admitting: Family Medicine

## 2014-06-10 DIAGNOSIS — R928 Other abnormal and inconclusive findings on diagnostic imaging of breast: Secondary | ICD-10-CM

## 2014-06-14 ENCOUNTER — Other Ambulatory Visit: Payer: Self-pay | Admitting: Obstetrics and Gynecology

## 2014-06-14 DIAGNOSIS — R928 Other abnormal and inconclusive findings on diagnostic imaging of breast: Secondary | ICD-10-CM

## 2014-06-15 ENCOUNTER — Ambulatory Visit (HOSPITAL_COMMUNITY)
Admission: RE | Admit: 2014-06-15 | Discharge: 2014-06-15 | Disposition: A | Discharge status: Home / Self Care | Payer: No Typology Code available for payment source | Source: Ambulatory Visit | Attending: Obstetrics and Gynecology | Admitting: Obstetrics and Gynecology

## 2014-06-15 ENCOUNTER — Encounter (HOSPITAL_COMMUNITY): Payer: Self-pay

## 2014-06-15 VITALS — BP 138/82 | Temp 97.9°F | Ht 63.0 in | Wt 192.2 lb

## 2014-06-15 DIAGNOSIS — Z1239 Encounter for other screening for malignant neoplasm of breast: Secondary | ICD-10-CM

## 2014-06-15 NOTE — Patient Instructions (Signed)
Taught Kristopher Glee how to perform BSE and gave educational materials to take home. Patient did not need a Pap smear today due to last Pap smear was May 24, 2014 per patient. Let her know [facility] will cover Pap smears every 3 years unless has a history of abnormal Pap smears. Referred patient to the [facility] for right breast diagnostic mammogram per recommendation. Appointment scheduled for Wednesday, June 16, 2014 at 1100. Patient aware of appointment and will be there.  [name] verbalized understanding.  Brannock, Arvil Chaco, RN 2:39 PM

## 2014-06-15 NOTE — Progress Notes (Signed)
Patient referred to [facility] by the [facility] due to recommendation of right breast diagnostic mammogram and possible ultrasound. Screening mammogram completed 06/09/2014 at the [facility].  Pap Smear:  Pap smear not completed today. Last Pap smear was 05/24/2014 at the free cervical cancer screening at [facility] sponsored by the [facility] and normal per patient. Per patient has no history of an abnormal Pap smear. Last Pap smear result is not in EPIC. Previous Pap smear result on 01/24/2012 is normal and in EPIC.  Physical exam: Breasts Breasts symmetrical. No skin abnormalities bilateral breasts. No nipple retraction bilateral breasts. No nipple discharge bilateral breasts. No lymphadenopathy. No lumps palpated bilateral breasts. Complaints of right outer breast tenderness on exam. Referred patient to the [facility] for right breast diagnostic mammogram per recommendation. Appointment scheduled for Wednesday, June 16, 2014 at 1100.  Pelvic/Bimanual No Pap smear completed today since last Pap smear was 05/24/2014. Pap smear not indicated per [facility] guidelines.

## 2014-06-16 ENCOUNTER — Encounter (INDEPENDENT_AMBULATORY_CARE_PROVIDER_SITE_OTHER): Payer: Self-pay

## 2014-06-16 ENCOUNTER — Other Ambulatory Visit: Payer: Self-pay | Admitting: Obstetrics and Gynecology

## 2014-06-16 ENCOUNTER — Ambulatory Visit
Admission: RE | Admit: 2014-06-16 | Discharge: 2014-06-16 | Disposition: A | Discharge status: Home / Self Care | Payer: No Typology Code available for payment source | Source: Ambulatory Visit | Attending: Family Medicine | Admitting: Family Medicine

## 2014-06-16 DIAGNOSIS — R928 Other abnormal and inconclusive findings on diagnostic imaging of breast: Secondary | ICD-10-CM

## 2014-06-16 HISTORY — PX: BREAST BIOPSY: SHX20

## 2014-06-17 ENCOUNTER — Ambulatory Visit (INDEPENDENT_AMBULATORY_CARE_PROVIDER_SITE_OTHER): Payer: Self-pay | Admitting: Family Medicine

## 2014-06-17 ENCOUNTER — Encounter: Payer: Self-pay | Admitting: Family Medicine

## 2014-06-17 VITALS — BP 115/74 | HR 70 | Temp 97.7°F | Ht 63.0 in | Wt 190.0 lb

## 2014-06-17 DIAGNOSIS — M2550 Pain in unspecified joint: Secondary | ICD-10-CM

## 2014-06-17 DIAGNOSIS — W57XXXA Bitten or stung by nonvenomous insect and other nonvenomous arthropods, initial encounter: Secondary | ICD-10-CM

## 2014-06-17 DIAGNOSIS — T148 Other injury of unspecified body region: Secondary | ICD-10-CM

## 2014-06-17 LAB — CBC WITH DIFFERENTIAL/PLATELET
BASOS ABS: 0 10*3/uL (ref 0.0–0.1)
Basophils Relative: 0.3 % (ref 0.0–3.0)
EOS ABS: 0.4 10*3/uL (ref 0.0–0.7)
Eosinophils Relative: 4.5 % (ref 0.0–5.0)
HEMATOCRIT: 40.8 % (ref 36.0–46.0)
Hemoglobin: 13.7 g/dL (ref 12.0–15.0)
LYMPHS ABS: 2.9 10*3/uL (ref 0.7–4.0)
Lymphocytes Relative: 30.5 % (ref 12.0–46.0)
MCHC: 33.7 g/dL (ref 30.0–36.0)
MCV: 89.1 fl (ref 78.0–100.0)
MONO ABS: 0.6 10*3/uL (ref 0.1–1.0)
Monocytes Relative: 5.9 % (ref 3.0–12.0)
NEUTROS PCT: 58.8 % (ref 43.0–77.0)
Neutro Abs: 5.6 10*3/uL (ref 1.4–7.7)
PLATELETS: 379 10*3/uL (ref 150.0–400.0)
RBC: 4.58 Mil/uL (ref 3.87–5.11)
RDW: 15 % (ref 11.5–15.5)
WBC: 9.5 10*3/uL (ref 4.0–10.5)

## 2014-06-17 NOTE — Progress Notes (Signed)
Pre visit review using our clinic review tool, if applicable. No additional management support is needed unless otherwise documented below in the visit note. 

## 2014-06-17 NOTE — Progress Notes (Signed)
Subjective:    Patient ID: [name], female    DOB: 04-07-1961, 53 y.o.   MRN: [id number]  HPI Here to follow up symptoms from a tick bite to the left thigh which occurred 6 weeks ago. She was seen here 5 weeks ago and was started on Doxycycline for 3 weeks. She has now been off this for 2 weeks. At first she had a host of sx including rash at the bite site, fevers, body aches, nausea, and chest pains. All these have since stopped except for for diffuse stiffness and mild pain in all her joints.    Review of Systems  Constitutional: Negative.   Respiratory: Negative.   Cardiovascular: Negative.   Musculoskeletal: Positive for arthralgias. Negative for joint swelling.  Skin: Negative for rash.       Objective:   Physical Exam  Constitutional: She appears well-developed and well-nourished.  Cardiovascular: Normal rate, regular rhythm, normal heart sounds and intact distal pulses.   Pulmonary/Chest: Effort normal and breath sounds normal.  Musculoskeletal: Normal range of motion. She exhibits no edema and no tenderness.          Assessment & Plan:  Suggested she try Aleve prn. Get labs today including Lyme titers.

## 2014-06-21 LAB — B. BURGDORFI ANTIBODIES: B burgdorferi Ab IgG+IgM: 0.24 {ISR}

## 2014-06-25 ENCOUNTER — Encounter (INDEPENDENT_AMBULATORY_CARE_PROVIDER_SITE_OTHER): Payer: Self-pay

## 2014-06-25 ENCOUNTER — Other Ambulatory Visit: Payer: Self-pay | Admitting: Obstetrics and Gynecology

## 2014-06-25 ENCOUNTER — Ambulatory Visit
Admission: RE | Admit: 2014-06-25 | Discharge: 2014-06-25 | Disposition: A | Discharge status: Home / Self Care | Payer: No Typology Code available for payment source | Source: Ambulatory Visit | Attending: Obstetrics and Gynecology | Admitting: Obstetrics and Gynecology

## 2014-06-25 DIAGNOSIS — R928 Other abnormal and inconclusive findings on diagnostic imaging of breast: Secondary | ICD-10-CM

## 2014-07-01 ENCOUNTER — Encounter: Payer: Self-pay | Admitting: Physician Assistant

## 2014-07-01 ENCOUNTER — Ambulatory Visit: Payer: Self-pay | Admitting: Physician Assistant

## 2014-07-01 ENCOUNTER — Ambulatory Visit (INDEPENDENT_AMBULATORY_CARE_PROVIDER_SITE_OTHER): Payer: Self-pay | Admitting: Physician Assistant

## 2014-07-01 ENCOUNTER — Ambulatory Visit: Payer: Self-pay | Admitting: Family Medicine

## 2014-07-01 VITALS — BP 110/80 | HR 84 | Temp 98.6°F | Resp 18 | Wt 191.0 lb

## 2014-07-01 DIAGNOSIS — J020 Streptococcal pharyngitis: Secondary | ICD-10-CM

## 2014-07-01 DIAGNOSIS — J029 Acute pharyngitis, unspecified: Secondary | ICD-10-CM

## 2014-07-01 LAB — POCT RAPID STREP A (OFFICE): RAPID STREP A SCREEN: POSITIVE — AB

## 2014-07-01 MED ORDER — PENICILLIN G BENZATHINE 1200000 UNIT/2ML IM SUSP
1.2000 10*6.[IU] | Freq: Once | INTRAMUSCULAR | Status: AC
Start: 2014-07-01 — End: 2014-07-01
  Administered 2014-07-01: 1.2 10*6.[IU] via INTRAMUSCULAR

## 2014-07-01 NOTE — Progress Notes (Signed)
Subjective:    Patient ID: [name], female    DOB: 1961-02-11, 53 y.o.   MRN: [id number]  Sore Throat  This is a new problem. The current episode started yesterday. The problem has been gradually worsening. Neither side of throat is experiencing more pain than the other. The maximum temperature recorded prior to her arrival was 102 - 102.9 F. The fever has been present for 1 to 2 days. The pain is at a severity of 9/10. The pain is severe. Associated symptoms include abdominal pain (cramping), headaches, a hoarse voice, swollen glands and trouble swallowing. Pertinent negatives include no congestion, coughing, diarrhea, drooling, ear discharge, ear pain, plugged ear sensation, neck pain, shortness of breath, stridor or vomiting. She has had no exposure to strep (has little kids at home, but they are not sick) or mono. She has tried acetaminophen (nyquil) for the symptoms. Improvement on treatment: tylenol helping fever.      Review of Systems  Constitutional: Positive for fever and chills.  HENT: Positive for hoarse voice, sore throat and trouble swallowing. Negative for congestion, drooling, ear discharge, ear pain, postnasal drip and sinus pressure.   Respiratory: Negative for cough, shortness of breath and stridor.   Cardiovascular: Negative for chest pain.  Gastrointestinal: Positive for nausea and abdominal pain (cramping). Negative for vomiting and diarrhea.  Musculoskeletal: Negative for neck pain.  Neurological: Positive for headaches. Negative for syncope.  All other systems reviewed and are negative.    Past Medical History  Diagnosis Date  . Depression   . Hypertension   . Genital warts   . Cardiac arrhythmia   . Heart murmur   . Fibroids     uterine sees Dr. Neva Seat in [place]   . Low back pain     has a herniated lumbar disc sees Dr August Saucer  . Allergy     Dr Stevphen Rochester  . Irritable bowel syndrome (IBS)     History   Social History  . Marital Status:  Married    Spouse Name: N/A    Number of Children: N/A  . Years of Education: N/A   Occupational History  . N/A    Social History Main Topics  . Smoking status: Former Smoker -- 0.50 packs/day for 1 years    Types: Cigarettes    Quit date: 12/17/1996  . Smokeless tobacco: Never Used  . Alcohol Use: Yes     Comment: occ ( 1-2x year)  . Drug Use: No  . Sexual Activity: Yes    Birth Control/ Protection: None   Other Topics Concern  . Not on file   Social History Narrative  . No narrative on file    Past Surgical History  Procedure Laterality Date  . Tubal ligation    . Myomectomy      laser ablation of uterine fibroids per Dr Dareen Piano 2000  . Lumbar laminectomy      L3-4 on 02-24-10 per Dr Otelia Sergeant   . Abdominoplasty  04/2013    Family History  Problem Relation Age of Onset  . Arthritis      Family Hx  . Diabetes      Family Hx 1st degree relative  . Hyperlipidemia      Family Hx  . Hypertension      Family Hx  . Prostate cancer      Family Hx 1st Degree relative <50  . Stroke      First Degree Female <50  . Diabetes Mother   .  Hypertension Mother   . Diabetes Father   . Hypertension Father   . Diabetes Maternal Grandmother   . Hypertension Maternal Grandmother   . Diabetes Maternal Grandfather   . Hypertension Maternal Grandfather   . Diabetes Paternal Grandmother   . Hypertension Paternal Grandmother   . Diabetes Paternal Grandfather   . Hypertension Paternal Grandfather     Allergies  Allergen Reactions  . Hctz [Hydrochlorothiazide] Other (See Comments)    Blood pressure goes up  . Peanut-Containing Drug Products Anaphylaxis  . Ciprofloxacin Diarrhea  . Shellfish-Derived Products Other (See Comments)     Per allergy test    Current Outpatient Prescriptions on File Prior to Visit  Medication Sig Dispense Refill  . albuterol (PROVENTIL HFA;VENTOLIN HFA) 108 (90 BASE) MCG/ACT inhaler Inhale 2 puffs into the lungs every 4 (four) hours as needed for  wheezing or shortness of breath.  1 Inhaler  11  . amLODipine (NORVASC) 10 MG tablet TAKE ONE TABLET BY MOUTH DAILY  90 tablet  0  . beclomethasone (QVAR) 80 MCG/ACT inhaler Inhale 2 puffs into the lungs 2 (two) times daily.  1 Inhaler  11  . BEE POLLEN PO Take 2 tablets by mouth daily.      . cholecalciferol (VITAMIN D) 1000 UNITS tablet Take 1,000 Units by mouth daily.       Marland Kitchen doxycycline (VIBRA-TABS) 100 MG tablet Take 1 tablet (100 mg total) by mouth 2 (two) times daily.  42 tablet  0  . EPINEPHrine (EPIPEN 2-PAK) 0.3 mg/0.3 mL SOAJ injection Inject 0.3 mLs (0.3 mg total) into the muscle once.  1 Device  11  . furosemide (LASIX) 20 MG tablet Take 1 tablet (20 mg total) by mouth daily.  90 tablet  3  . Garcinia Cambogia-Chromium 500-200 MG-MCG TABS Take 1 capsule by mouth daily.      Marland Kitchen HYDROcodone-acetaminophen (NORCO) 10-325 MG per tablet Take 1 tablet by mouth every 6 (six) hours as needed for pain.  120 tablet  5  . lisinopril (PRINIVIL,ZESTRIL) 20 MG tablet TAKE ONE TABLET BY MOUTH DAILY  90 tablet  0  . Multiple Vitamins-Minerals (MULTIVITAMIN WITH MINERALS) tablet Take 1 tablet by mouth daily.       No current facility-administered medications on file prior to visit.    EXAM: BP 110/80  Pulse 84  Temp(Src) 98.6 F (37 C) (Oral)  Resp 18  Wt 191 lb (86.637 kg)  LMP 06/08/2014      Objective:   Physical Exam  Nursing note and vitals reviewed. Constitutional: She is oriented to person, place, and time. She appears well-developed and well-nourished. No distress.  HENT:  Head: Normocephalic and atraumatic.  Right Ear: External ear normal.  Left Ear: External ear normal.  Nose: Nose normal.  Mouth/Throat: Oropharyngeal exudate present.  Oropharynx is erythematous with exudate. Bilateral TMs normal. Bilateral frontal and maxillary sinuses non-TTP.  Eyes: Conjunctivae and EOM are normal. Pupils are equal, round, and reactive to light.  Neck: Normal range of motion. Neck  supple. No JVD present.  Cardiovascular: Normal rate, regular rhythm and intact distal pulses.   Pulmonary/Chest: Effort normal and breath sounds normal. No stridor. No respiratory distress. She exhibits no tenderness.  Abdominal: Soft. Bowel sounds are normal. She exhibits no distension and no mass. There is tenderness. There is no rebound and no guarding.  Mild epigastric tenderness.  Musculoskeletal: Normal range of motion.  Lymphadenopathy:    She has cervical adenopathy.  Neurological: She is alert and oriented to  person, place, and time.  Skin: Skin is warm and dry. No rash noted. She is not diaphoretic. No erythema. No pallor.  Psychiatric: She has a normal mood and affect. Her behavior is normal. Judgment and thought content normal.     Lab Results  Component Value Date   WBC 9.5 06/17/2014   HGB 13.7 06/17/2014   HCT 40.8 06/17/2014   PLT 379.0 06/17/2014   GLUCOSE 93 06/01/2013   CHOL 178 04/23/2012   TRIG 57.0 04/23/2012   HDL 50.20 04/23/2012   LDLCALC 116* 04/23/2012   ALT 36* 06/01/2013   AST 22 06/01/2013   NA 139 06/01/2013   K 4.0 06/01/2013   CL 101 06/01/2013   CREATININE 0.70 06/01/2013   BUN 12 06/01/2013   CO2 24 06/01/2013   TSH 0.86 03/17/2013   INR 1.1* 03/17/2013   HGBA1C 5.7 06/23/2010        Assessment & Plan:  Railyn was seen today for sore throat.  Diagnoses and associated orders for this visit:  Streptococcal sore throat Comments: Will tx with Bicillin injection. - penicillin g benzathine (BICILLIN LA) 1200000 UNIT/2ML injection 1.2 Million Units; Inject 2 mLs (1.2 Million Units total) into the muscle once.  Sore throat - POC Rapid Strep A    Rapid Strep Positive. Tx with Bicillin per pt request when given options. She will monitor her symptoms for improvement, and monitor her children for development of symptoms.  Return precautions provided, and patient handout on strep throat.  Plan to follow up as needed, or for worsening or persistent symptoms despite  treatment.  Patient Instructions  You should start feeling better soon. Maintain adequate fluid hydration, and get plenty of rest.  If emergency symptoms discussed during visit developed, seek medical attention immediately.  Followup as needed, or for worsening or persistent symptoms despite treatment.

## 2014-07-01 NOTE — Patient Instructions (Signed)
You should start feeling better soon. Maintain adequate fluid hydration, and get plenty of rest.  If emergency symptoms discussed during visit developed, seek medical attention immediately.  Followup as needed, or for worsening or persistent symptoms despite treatment.    Strep Throat Strep throat is an infection of the throat. It is caused by a germ. Strep throat spreads from person to person by coughing, sneezing, or close contact. HOME CARE  Rinse your mouth (gargle) with warm salt water (1 teaspoon salt in 1 cup of water). Do this 3 to 4 times per day or as needed for comfort.  Family members with a sore throat or fever should see a doctor.  Make sure everyone in your house washes their hands well.  Do not share food, drinking cups, or personal items.  Eat soft foods until your sore throat gets better.  Drink enough water and fluids to keep your pee (urine) clear or pale yellow.  Rest.  Stay home from school, daycare, or work until you have taken medicine for 24 hours.  Only take medicine as told by your doctor.  Take your medicine as told. Finish it even if you start to feel better. GET HELP RIGHT AWAY IF:   You have new problems, such as throwing up (vomiting) or bad headaches.  You have a stiff or painful neck, chest pain, trouble breathing, or trouble swallowing.  You have very bad throat pain, drooling, or changes in your voice.  Your neck puffs up (swells) or gets red and tender.  You have a fever.  You are very tired, your mouth is dry, or you are peeing less than normal.  You cannot wake up completely.  You get a rash, cough, or earache.  You have green, yellow-brown, or bloody spit.  Your pain does not get better with medicine. MAKE SURE YOU:   Understand these instructions.  Will watch your condition.  Will get help right away if you are not doing well or get worse. Document Released: 05/21/2008 Document Revised: 02/25/2012 Document Reviewed:  02/01/2011 [facility] Patient Information 2015 [place], [place]. This information is not intended to replace advice given to you by your health care provider. Make sure you discuss any questions you have with your health care provider.

## 2014-07-01 NOTE — Progress Notes (Signed)
Pre visit review using our clinic review tool, if applicable. No additional management support is needed unless otherwise documented below in the visit note. 

## 2014-07-29 ENCOUNTER — Encounter (HOSPITAL_COMMUNITY): Payer: Self-pay

## 2014-08-23 ENCOUNTER — Other Ambulatory Visit: Payer: Self-pay | Admitting: Family Medicine

## 2014-10-18 ENCOUNTER — Encounter: Payer: Self-pay | Admitting: Physician Assistant

## 2014-11-25 ENCOUNTER — Telehealth: Payer: Self-pay | Admitting: Family Medicine

## 2014-11-25 NOTE — Telephone Encounter (Signed)
Okay to schedule

## 2014-11-25 NOTE — Telephone Encounter (Signed)
Pt has cough for 2 months and everyone in her family has been sick.  Pt would like an appt w/ dr fry tomorrow for this cough. pls advise if ok to schedule.

## 2014-11-26 ENCOUNTER — Encounter: Payer: Self-pay | Admitting: Family Medicine

## 2014-11-26 ENCOUNTER — Ambulatory Visit (INDEPENDENT_AMBULATORY_CARE_PROVIDER_SITE_OTHER): Payer: Self-pay | Admitting: Family Medicine

## 2014-11-26 VITALS — BP 115/79 | HR 84 | Temp 98.2°F | Ht 63.0 in | Wt 188.0 lb

## 2014-11-26 DIAGNOSIS — Z0000 Encounter for general adult medical examination without abnormal findings: Secondary | ICD-10-CM

## 2014-11-26 DIAGNOSIS — R252 Cramp and spasm: Secondary | ICD-10-CM

## 2014-11-26 DIAGNOSIS — K219 Gastro-esophageal reflux disease without esophagitis: Secondary | ICD-10-CM

## 2014-11-26 DIAGNOSIS — R05 Cough: Secondary | ICD-10-CM

## 2014-11-26 DIAGNOSIS — R059 Cough, unspecified: Secondary | ICD-10-CM

## 2014-11-26 LAB — POCT URINALYSIS DIPSTICK
Bilirubin, UA: NEGATIVE
Glucose, UA: NEGATIVE
LEUKOCYTES UA: NEGATIVE
Nitrite, UA: NEGATIVE
PH UA: 5.5
Protein, UA: NEGATIVE
Spec Grav, UA: 1.025
Urobilinogen, UA: 1

## 2014-11-26 LAB — CBC WITH DIFFERENTIAL/PLATELET
BASOS PCT: 0.4 % (ref 0.0–3.0)
Basophils Absolute: 0 10*3/uL (ref 0.0–0.1)
EOS ABS: 0.3 10*3/uL (ref 0.0–0.7)
Eosinophils Relative: 2.9 % (ref 0.0–5.0)
HEMATOCRIT: 40.3 % (ref 36.0–46.0)
Hemoglobin: 13.2 g/dL (ref 12.0–15.0)
LYMPHS PCT: 36.6 % (ref 12.0–46.0)
Lymphs Abs: 3.2 10*3/uL (ref 0.7–4.0)
MCHC: 32.8 g/dL (ref 30.0–36.0)
MCV: 87.2 fl (ref 78.0–100.0)
Monocytes Absolute: 0.6 10*3/uL (ref 0.1–1.0)
Monocytes Relative: 6.6 % (ref 3.0–12.0)
NEUTROS ABS: 4.7 10*3/uL (ref 1.4–7.7)
Neutrophils Relative %: 53.5 % (ref 43.0–77.0)
Platelets: 425 10*3/uL — ABNORMAL HIGH (ref 150.0–400.0)
RBC: 4.62 Mil/uL (ref 3.87–5.11)
RDW: 15 % (ref 11.5–15.5)
WBC: 8.7 10*3/uL (ref 4.0–10.5)

## 2014-11-26 LAB — BASIC METABOLIC PANEL
BUN: 12 mg/dL (ref 6–23)
CHLORIDE: 106 meq/L (ref 96–112)
CO2: 26 mEq/L (ref 19–32)
Calcium: 9.7 mg/dL (ref 8.4–10.5)
Creatinine, Ser: 0.8 mg/dL (ref 0.4–1.2)
GFR: 95.56 mL/min (ref >60.00)
Glucose, Bld: 114 mg/dL — ABNORMAL HIGH (ref 70–99)
POTASSIUM: 3.7 meq/L (ref 3.5–5.1)
SODIUM: 138 meq/L (ref 135–145)

## 2014-11-26 LAB — HEPATIC FUNCTION PANEL
ALK PHOS: 112 U/L (ref 39–117)
ALT: 18 U/L (ref 0–35)
AST: 15 U/L (ref 0–37)
Albumin: 4.3 g/dL (ref 3.5–5.2)
BILIRUBIN TOTAL: 0.4 mg/dL (ref 0.2–1.2)
Bilirubin, Direct: 0 mg/dL (ref 0.0–0.3)
Total Protein: 7.2 g/dL (ref 6.0–8.3)

## 2014-11-26 LAB — TSH: TSH: 0.78 u[IU]/mL (ref 0.35–4.50)

## 2014-11-26 LAB — MAGNESIUM: Magnesium: 2.1 mg/dL (ref 1.5–2.5)

## 2014-11-26 MED ORDER — OMEPRAZOLE 40 MG PO CPDR: 40.0000 mg | DELAYED_RELEASE_CAPSULE | Freq: Every day | ORAL | Status: DC

## 2014-11-26 NOTE — Telephone Encounter (Signed)
Done

## 2014-11-26 NOTE — Progress Notes (Signed)
Pre visit review using our clinic review tool, if applicable. No additional management support is needed unless otherwise documented below in the visit note. 

## 2014-11-26 NOTE — Progress Notes (Signed)
Subjective:    Patient ID: [name], female    DOB: [birth year], 52 y.o.   MRN: [id number]  HPI Here to discuss a chronic dry cough she has had for 2 months. No sinus drainage or fever, no ST. She has had a lot of heartburn lately and she often wakes up in the mornings with a burning sensation in the throat. No SOB. Also she describes intermittent muscle cramps for the past year, especially in the legs.    Review of Systems  Constitutional: Negative.   Respiratory: Negative for cough, chest tightness, shortness of breath and wheezing.   Cardiovascular: Negative.   Musculoskeletal: Positive for myalgias.       Objective:   Physical Exam  Constitutional: She is oriented to person, place, and time. She appears well-developed and well-nourished.  Neck: No thyromegaly present.  Cardiovascular: Normal rate, regular rhythm, normal heart sounds and intact distal pulses.   Pulmonary/Chest: Effort normal and breath sounds normal. No respiratory distress. She has no wheezes. She has no rales.  Lymphadenopathy:    She has no cervical adenopathy.  Neurological: She is alert and oriented to person, place, and time.          Assessment & Plan:  Her cough is probably an effect of chronic nocturnal GERD. Start on Prilosec 40 mg daily before supper. Her muscle cramps may be from an electrolyte disturbance. Get labs today.

## 2014-12-27 ENCOUNTER — Encounter: Payer: Self-pay | Admitting: Internal Medicine

## 2014-12-27 ENCOUNTER — Encounter: Payer: Self-pay | Admitting: Family Medicine

## 2014-12-27 ENCOUNTER — Ambulatory Visit (INDEPENDENT_AMBULATORY_CARE_PROVIDER_SITE_OTHER): Payer: Self-pay | Admitting: Family Medicine

## 2014-12-27 VITALS — BP 121/81 | HR 69 | Temp 97.9°F | Ht 63.0 in | Wt 188.0 lb

## 2014-12-27 DIAGNOSIS — H10023 Other mucopurulent conjunctivitis, bilateral: Secondary | ICD-10-CM

## 2014-12-27 MED ORDER — NEOMYCIN-POLYMYXIN-HC 3.5-10000-1 OP SUSP: 3.0000 [drp] | Freq: Four times a day (QID) | OPHTHALMIC | Status: DC

## 2014-12-27 NOTE — Progress Notes (Signed)
Subjective:    Patient ID: [name], female    DOB: 11/24/1961, 54 y.o.   MRN: [id number]  HPI Here for 2 days of itchy red eyes with a crusty DC. 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 No other sx.    Review of Systems  Constitutional: Negative.   HENT: Negative.   Eyes: Positive for discharge, redness and itching. Negative for visual disturbance.  Respiratory: Negative.        Objective:   Physical Exam  Constitutional: She appears well-developed and well-nourished.  HENT:  Left Ear: External ear normal.  Nose: Nose normal.  Mouth/Throat: Oropharynx is clear and moist.  Eyes:  Both conjunctivae are pink   Lymphadenopathy:    She has no cervical adenopathy.          Assessment & Plan:  Recheck prn. Wash hands frequently.

## 2014-12-27 NOTE — Progress Notes (Signed)
Pre visit review using our clinic review tool, if applicable. No additional management support is needed unless otherwise documented below in the visit note. 

## 2014-12-29 ENCOUNTER — Telehealth: Payer: Self-pay | Admitting: Family Medicine

## 2014-12-29 NOTE — Telephone Encounter (Signed)
Refill request for Lasix 20 mg take 1 po qd and send to [facility].

## 2014-12-30 ENCOUNTER — Other Ambulatory Visit: Payer: Self-pay | Admitting: Family Medicine

## 2014-12-30 ENCOUNTER — Telehealth: Payer: Self-pay | Admitting: Family Medicine

## 2014-12-30 MED ORDER — LISINOPRIL 20 MG PO TABS: 20.0000 mg | ORAL_TABLET | Freq: Every day | ORAL | Status: DC

## 2014-12-30 MED ORDER — FUROSEMIDE 20 MG PO TABS: ORAL_TABLET | ORAL | Status: DC

## 2014-12-30 NOTE — Telephone Encounter (Signed)
Pt needs refills on lisinopril and amlodipine call into [facility]

## 2014-12-30 NOTE — Telephone Encounter (Signed)
I sent script e-scribe. 

## 2014-12-31 NOTE — Telephone Encounter (Signed)
Refill for one year 

## 2015-01-03 NOTE — Telephone Encounter (Signed)
Script was sent e-scribe 

## 2015-01-26 ENCOUNTER — Ambulatory Visit (INDEPENDENT_AMBULATORY_CARE_PROVIDER_SITE_OTHER): Payer: Self-pay | Admitting: Family Medicine

## 2015-01-26 ENCOUNTER — Encounter: Payer: Self-pay | Admitting: Family Medicine

## 2015-01-26 VITALS — BP 139/86 | HR 74 | Temp 98.2°F | Ht 63.0 in | Wt 188.0 lb

## 2015-01-26 DIAGNOSIS — S76012D Strain of muscle, fascia and tendon of left hip, subsequent encounter: Secondary | ICD-10-CM

## 2015-01-26 MED ORDER — MELOXICAM 15 MG PO TABS: 15.0000 mg | ORAL_TABLET | Freq: Every day | ORAL | Status: DC

## 2015-01-26 NOTE — Progress Notes (Signed)
Pre visit review using our clinic review tool, if applicable. No additional management support is needed unless otherwise documented below in the visit note. 

## 2015-01-26 NOTE — Progress Notes (Signed)
Subjective:    Patient ID: [name], female    DOB: [birth year], 54 y.o.   MRN: [id number]  HPI Here for 4 months of intermittent pain and a "popping" sensation in the left anterior hip region. No weakness or giving way of the leg. This has been worse than usual over the past week. She has been exercising very hard for the past month with lifting weights, doing lunges and doing squats. She has tried Ibuprofen and Aleve with mixed results.    Review of Systems  Constitutional: Negative.   Musculoskeletal: Positive for arthralgias.       Objective:   Physical Exam  Constitutional: She appears well-developed and well-nourished.  Gait is normal   Musculoskeletal:  Left hip shows full ROM with no pain, including full internal and external rotations. No crepitus or clicking is felt. She is quite tender over the left hip flexor tendon           Assessment & Plan:  Hip flexor strain. She needs to rest this area to let it heal, so I told her to avoid all lower body exercises, especially squats. Apply ice packs. Start on Meloxicam daily. Recheck prn

## 2015-01-27 ENCOUNTER — Ambulatory Visit (AMBULATORY_SURGERY_CENTER): Payer: Self-pay

## 2015-01-27 VITALS — Ht 63.25 in | Wt 186.0 lb

## 2015-01-27 DIAGNOSIS — Z1211 Encounter for screening for malignant neoplasm of colon: Secondary | ICD-10-CM

## 2015-01-27 MED ORDER — MOVIPREP 100 G PO SOLR: 1.0000 | Freq: Once | ORAL | Status: DC

## 2015-01-27 NOTE — Progress Notes (Signed)
No allergies to eggs or soy (allergy test positive for soy allergy) Herbalife only diet meds No home oxygen No past problem with anesthesia (except orthostatic low b/p after general anesthesia)  Has email  Emmi instructions given for colonoscopy

## 2015-02-02 ENCOUNTER — Telehealth: Payer: Self-pay | Admitting: Internal Medicine

## 2015-02-02 NOTE — Telephone Encounter (Signed)
Called pt back and offered her a miralax prep alternative, pt will come in at 2pm 02/02/15 so we can go over instructions for miralax prep. Printed instructions-adm

## 2015-02-07 ENCOUNTER — Telehealth: Payer: Self-pay | Admitting: Family Medicine

## 2015-02-07 NOTE — Telephone Encounter (Signed)
That is about all there is OTC. Try taking Motrin and Tylenol together. Take Tylenol ES 2 tabs twice a day and Motrin 800 mg 3-4 times a day

## 2015-02-07 NOTE — Telephone Encounter (Signed)
Patient would like to know what OTC pain medication she can take for her hip?  She states motrin and tylenol doesn't work.

## 2015-02-08 NOTE — Telephone Encounter (Signed)
I spoke with pt  

## 2015-02-10 ENCOUNTER — Ambulatory Visit (AMBULATORY_SURGERY_CENTER): Payer: Self-pay | Admitting: Internal Medicine

## 2015-02-10 ENCOUNTER — Encounter: Payer: Self-pay | Admitting: Internal Medicine

## 2015-02-10 VITALS — BP 129/91 | HR 66 | Temp 96.8°F | Resp 25

## 2015-02-10 DIAGNOSIS — Z1211 Encounter for screening for malignant neoplasm of colon: Secondary | ICD-10-CM

## 2015-02-10 HISTORY — PX: COLONOSCOPY: SHX174

## 2015-02-10 MED ORDER — SODIUM CHLORIDE 0.9 % IV SOLN: 500.0000 mL | INTRAVENOUS | Status: DC

## 2015-02-10 NOTE — Patient Instructions (Signed)
YOU HAD AN ENDOSCOPIC PROCEDURE TODAY AT THE [facility]: Refer to the procedure report that was given to you for any specific questions about what was found during the examination.  If the procedure report does not answer your questions, please call your gastroenterologist to clarify.  If you requested that your care partner not be given the details of your procedure findings, then the procedure report has been included in a sealed envelope for you to review at your convenience later.  YOU SHOULD EXPECT: Some feelings of bloating in the abdomen. Passage of more gas than usual.  Walking can help get rid of the air that was put into your GI tract during the procedure and reduce the bloating. If you had a lower endoscopy (such as a colonoscopy or flexible sigmoidoscopy) you may notice spotting of blood in your stool or on the toilet paper. If you underwent a bowel prep for your procedure, then you may not have a normal bowel movement for a few days.  DIET: Your first meal following the procedure should be a light meal and then it is ok to progress to your normal diet.  A half-sandwich or bowl of soup is an example of a good first meal.  Heavy or fried foods are harder to digest and may make you feel nauseous or bloated.  Likewise meals heavy in dairy and vegetables can cause extra gas to form and this can also increase the bloating.  Drink plenty of fluids but you should avoid alcoholic beverages for 24 hours.  ACTIVITY: Your care partner should take you home directly after the procedure.  You should plan to take it easy, moving slowly for the rest of the day.  You can resume normal activity the day after the procedure however you should NOT DRIVE or use heavy machinery for 24 hours (because of the sedation medicines used during the test).    SYMPTOMS TO REPORT IMMEDIATELY: A gastroenterologist can be reached at any hour.  During normal business hours, 8:30 AM to 5:00 PM Monday through Friday,  call [phone].  After hours and on weekends, please call the GI answering service at [phone] who will take a message and have the physician on call contact you.   Following lower endoscopy (colonoscopy or flexible sigmoidoscopy):  Excessive amounts of blood in the stool  Significant tenderness or worsening of abdominal pains  Swelling of the abdomen that is new, acute  Fever of 100F or higher    FOLLOW UP: If any biopsies were taken you will be contacted by phone or by letter within the next 1-3 weeks.  Call your gastroenterologist if you have not heard about the biopsies in 3 weeks.  Our staff will call the home number listed on your records the next business day following your procedure to check on you and address any questions or concerns that you may have at that time regarding the information given to you following your procedure. This is a courtesy call and so if there is no answer at the home number and we have not heard from you through the emergency physician on call, we will assume that you have returned to your regular daily activities without incident.  SIGNATURES/CONFIDENTIALITY: You and/or your care partner have signed paperwork which will be entered into your electronic medical record.  These signatures attest to the fact that that the information above on your After Visit Summary has been reviewed and is understood.  Full responsibility of the confidentiality   of this discharge information lies with you and/or your care-partner.     

## 2015-02-10 NOTE — Progress Notes (Signed)
Report to PACU, RN, vss, BBS= Clear.  

## 2015-02-10 NOTE — Op Note (Addendum)
[place]  [address] [place], [address]   COLONOSCOPY PROCEDURE REPORT  PATIENT: [name], [name]  MR#: [id number] BIRTHDATE: Oct 14, 1961 , 54  yrs. old GENDER: female ENDOSCOPIST: Jerene Bears, MD REFERRED RD:EYCXKGY Raymon Mutton, M.D. PROCEDURE DATE:  02/10/2015 PROCEDURE:   Colonoscopy, screening First Screening Colonoscopy - Avg.  risk and is 50 yrs.  old or older Yes.  Prior Negative Screening - Now for repeat screening. N/A  History of Adenoma - Now for follow-up colonoscopy & has been > or = to 3 yrs.  N/A  Polyps Removed Today? Yes. ASA CLASS:   Class II INDICATIONS:average risk patient for colon cancer and 1st colonoscopy. MEDICATIONS: Monitored anesthesia care and Propofol 300 mg IV  DESCRIPTION OF PROCEDURE:   After the risks benefits and alternatives of the procedure were thoroughly explained, informed consent was obtained.  The digital rectal exam revealed no abnormalities of the rectum.   The LB [id number]  endoscope was introduced through the anus and advanced to the cecum, which was identified by both the appendix and ileocecal valve. No adverse events experienced.   The quality of the prep was good, using MoviPrep  The instrument was then slowly withdrawn as the colon was fully examined.   COLON FINDINGS: A normal appearing cecum, ileocecal valve, and appendiceal orifice were identified.  the ascending, transverse, descending, sigmoid colon, and rectum appeared unremarkable. Retroflexed views revealed no abnormalities. The time to cecum=2 minutes 12 seconds.  Withdrawal time=13 minutes 02 seconds.  The scope was withdrawn and the procedure completed.  COMPLICATIONS: There were no complications.  ENDOSCOPIC IMPRESSION: Normal colonoscopy  RECOMMENDATIONS: You should continue to follow colorectal cancer screening guidelines for "routine risk" patients with a repeat colonoscopy in 10 years. There is no need for FOBT (stool) testing for  at least 5 years.  eSigned:  Jerene Bears, MD 02/10/2015 9:53 AM Revised: 02/10/2015 9:53 AM  cc: Laurey Morale, MD and The Patient

## 2015-02-11 ENCOUNTER — Telehealth: Payer: Self-pay

## 2015-02-11 NOTE — Telephone Encounter (Signed)
No answer, left voicemail message.

## 2015-03-18 ENCOUNTER — Encounter: Payer: Self-pay | Admitting: Family Medicine

## 2015-03-18 ENCOUNTER — Ambulatory Visit (INDEPENDENT_AMBULATORY_CARE_PROVIDER_SITE_OTHER): Payer: Self-pay | Admitting: Family Medicine

## 2015-03-18 VITALS — BP 124/80 | HR 92 | Temp 98.5°F | Wt 187.0 lb

## 2015-03-18 DIAGNOSIS — J069 Acute upper respiratory infection, unspecified: Secondary | ICD-10-CM

## 2015-03-18 DIAGNOSIS — R062 Wheezing: Secondary | ICD-10-CM

## 2015-03-18 MED ORDER — PREDNISONE 10 MG PO TABS: ORAL_TABLET | ORAL | Status: DC

## 2015-03-18 NOTE — Progress Notes (Signed)
Subjective:    Patient ID: [name], female    DOB: 12/26/1960, 54 y.o.   MRN: [id number]  HPI Acute visit. Onset 3 days ago of cough, body aches, wheezing, nausea without vomiting, malaise, headache, sore throat. Husband with similar symptoms. She denies any definite fever.. She has albuterol inhaler which she is use the past for wheezing but is not use this with this episode. She has history of reported mild intermittent asthma. Former smoker  Past Medical History  Diagnosis Date  . Depression   . Hypertension   . Genital warts   . Cardiac arrhythmia   . Heart murmur   . Fibroids     uterine sees Dr. Neva Seat in [place]   . Low back pain     has a herniated lumbar disc sees Dr August Saucer  . Allergy     Dr Stevphen [place]  . Irritable bowel syndrome (IBS)    Past Surgical History  Procedure Laterality Date  . Tubal ligation    . Myomectomy      laser ablation of uterine fibroids per Dr Dareen Piano 2000  . Lumbar laminectomy      L3-4 on 02-24-10 per Dr Otelia Sergeant   . Abdominoplasty  04/2013    reports that she quit smoking about 18 years ago. Her smoking use included Cigarettes. She has a .5 pack-year smoking history. She has never used smokeless tobacco. She reports that she does not drink alcohol or use illicit drugs. family history includes Arthritis in an other family member; Diabetes in her father, maternal grandfather, maternal grandmother, mother, paternal grandfather, paternal grandmother, and another family member; Hyperlipidemia in an other family member; Hypertension in her father, maternal grandfather, maternal grandmother, mother, paternal grandfather, paternal grandmother, and another family member; Prostate cancer in an other family member; Stroke in an other family member. There is no history of Colon cancer. Allergies  Allergen Reactions  . Hctz [Hydrochlorothiazide] Other (See Comments)    Blood pressure goes up  . Peanut-Containing Drug Products Anaphylaxis  .  Ciprofloxacin Diarrhea  . Shellfish-Derived Products Other (See Comments)     Per allergy test      Review of Systems  Constitutional: Positive for fatigue.  HENT: Positive for congestion and sore throat.   Respiratory: Positive for cough.        Objective:   Physical Exam  Constitutional: She appears well-developed and well-nourished.  HENT:  Right Ear: External ear normal.  Left Ear: External ear normal.  Mouth/Throat: Oropharynx is clear and moist.  Neck: Neck supple.  Cardiovascular: Normal rate and regular rhythm.   No murmur heard. Pulmonary/Chest: Effort normal and breath sounds normal. She has no rales.  She has some mild expiratory wheezes. No retractions. No rales.  Lymphadenopathy:    She has no cervical adenopathy.          Assessment & Plan:  Probable viral URI with mild reactive airway component. Prednisone taper. Symptomatic treatment. Follow-up as needed

## 2015-03-18 NOTE — Patient Instructions (Signed)

## 2015-03-18 NOTE — Progress Notes (Signed)
Pre visit review using our clinic review tool, if applicable. No additional management support is needed unless otherwise documented below in the visit note. 

## 2015-03-22 ENCOUNTER — Encounter: Payer: Self-pay | Admitting: Family Medicine

## 2015-03-22 ENCOUNTER — Ambulatory Visit (INDEPENDENT_AMBULATORY_CARE_PROVIDER_SITE_OTHER): Payer: Self-pay | Admitting: Family Medicine

## 2015-03-22 VITALS — BP 124/83 | HR 82 | Temp 98.1°F

## 2015-03-22 DIAGNOSIS — J209 Acute bronchitis, unspecified: Secondary | ICD-10-CM

## 2015-03-22 MED ORDER — CLARITHROMYCIN 500 MG PO TABS: 500.0000 mg | ORAL_TABLET | Freq: Two times a day (BID) | ORAL | Status: DC

## 2015-03-22 MED ORDER — HYDROCODONE-HOMATROPINE 5-1.5 MG/5ML PO SYRP: 5.0000 mL | ORAL_SOLUTION | ORAL | Status: DC | PRN

## 2015-03-22 MED ORDER — ONDANSETRON HCL 8 MG PO TABS: 8.0000 mg | ORAL_TABLET | Freq: Three times a day (TID) | ORAL | Status: DC | PRN

## 2015-03-22 NOTE — Progress Notes (Signed)
Subjective:    Patient ID: [name], female    DOB: Jul 19, 1961, 54 y.o.   MRN: [id number]  HPI Here for 9 days of various sx including fever, body aches, ST, HA, nausea without vomiting, and coughing up green sputum. She was here 4 days ago and it seemed to be a viral URI at that time. However the aches and fever and ST have resolved but her chest has become more congested and the cough is worse.    Review of Systems  Constitutional: Positive for fever, chills and fatigue.  HENT: Positive for congestion and postnasal drip.   Eyes: Negative.   Respiratory: Positive for cough and chest tightness.        Objective:   Physical Exam  Constitutional: She appears well-developed and well-nourished.  HENT:  Right Ear: External ear normal.  Left Ear: External ear normal.  Nose: Nose normal.  Mouth/Throat: Oropharynx is clear and moist.  Eyes: Conjunctivae are normal.  Pulmonary/Chest: Effort normal. She has no wheezes. She has no rales.  Scattered rhonchi  Lymphadenopathy:    She has no cervical adenopathy.          Assessment & Plan:  Her viral URI has now been complicated by a bronchitis. Treat with Biaxin

## 2015-03-22 NOTE — Progress Notes (Signed)
Pre visit review using our clinic review tool, if applicable. No additional management support is needed unless otherwise documented below in the visit note. 

## 2015-04-18 ENCOUNTER — Telehealth: Payer: Self-pay | Admitting: Family Medicine

## 2015-04-18 NOTE — Telephone Encounter (Signed)
done

## 2015-04-18 NOTE — Telephone Encounter (Signed)
Patient states she need a CPX before 05/02/15.  Can I work her in?

## 2015-04-20 ENCOUNTER — Other Ambulatory Visit: Payer: Self-pay

## 2015-04-21 ENCOUNTER — Other Ambulatory Visit (INDEPENDENT_AMBULATORY_CARE_PROVIDER_SITE_OTHER): Payer: Self-pay

## 2015-04-21 DIAGNOSIS — Z0000 Encounter for general adult medical examination without abnormal findings: Secondary | ICD-10-CM

## 2015-04-21 LAB — CBC WITH DIFFERENTIAL/PLATELET
BASOS ABS: 0 10*3/uL (ref 0.0–0.1)
Basophils Relative: 0.4 % (ref 0.0–3.0)
EOS ABS: 0.2 10*3/uL (ref 0.0–0.7)
Eosinophils Relative: 2.2 % (ref 0.0–5.0)
HCT: 40.2 % (ref 36.0–46.0)
Hemoglobin: 13.6 g/dL (ref 12.0–15.0)
LYMPHS PCT: 35.8 % (ref 12.0–46.0)
Lymphs Abs: 3.1 10*3/uL (ref 0.7–4.0)
MCHC: 33.7 g/dL (ref 30.0–36.0)
MCV: 86.3 fl (ref 78.0–100.0)
Monocytes Absolute: 0.6 10*3/uL (ref 0.1–1.0)
Monocytes Relative: 7.2 % (ref 3.0–12.0)
Neutro Abs: 4.7 10*3/uL (ref 1.4–7.7)
Neutrophils Relative %: 54.4 % (ref 43.0–77.0)
PLATELETS: 363 10*3/uL (ref 150.0–400.0)
RBC: 4.67 Mil/uL (ref 3.87–5.11)
RDW: 15.1 % (ref 11.5–15.5)
WBC: 8.6 10*3/uL (ref 4.0–10.5)

## 2015-04-21 LAB — POCT URINALYSIS DIPSTICK
BILIRUBIN UA: NEGATIVE
Ketones, UA: NEGATIVE
LEUKOCYTES UA: NEGATIVE
NITRITE UA: NEGATIVE
PH UA: 6
Protein, UA: NEGATIVE
Spec Grav, UA: 1.025
UROBILINOGEN UA: 0.2

## 2015-04-21 LAB — BASIC METABOLIC PANEL
BUN: 12 mg/dL (ref 6–23)
CHLORIDE: 104 meq/L (ref 96–112)
CO2: 27 meq/L (ref 19–32)
CREATININE: 0.81 mg/dL (ref 0.40–1.20)
Calcium: 9.7 mg/dL (ref 8.4–10.5)
GFR: 95.41 mL/min (ref >60.00)
Glucose, Bld: 190 mg/dL — ABNORMAL HIGH (ref 70–99)
POTASSIUM: 3.7 meq/L (ref 3.5–5.1)
Sodium: 138 mEq/L (ref 135–145)

## 2015-04-21 LAB — LIPID PANEL
CHOLESTEROL: 232 mg/dL — AB (ref 0–200)
HDL: 41.7 mg/dL (ref >39.00)
LDL Cholesterol: 153 mg/dL — ABNORMAL HIGH (ref 0–99)
NonHDL: 190.3
Total CHOL/HDL Ratio: 6
Triglycerides: 186 mg/dL — ABNORMAL HIGH (ref 0.0–149.0)
VLDL: 37.2 mg/dL (ref 0.0–40.0)

## 2015-04-21 LAB — HEPATIC FUNCTION PANEL
ALT: 23 U/L (ref 0–35)
AST: 14 U/L (ref 0–37)
Albumin: 4.3 g/dL (ref 3.5–5.2)
Alkaline Phosphatase: 147 U/L — ABNORMAL HIGH (ref 39–117)
BILIRUBIN TOTAL: 0.4 mg/dL (ref 0.2–1.2)
Bilirubin, Direct: 0.1 mg/dL (ref 0.0–0.3)
TOTAL PROTEIN: 7.2 g/dL (ref 6.0–8.3)

## 2015-04-21 LAB — TSH: TSH: 1.96 u[IU]/mL (ref 0.35–4.50)

## 2015-04-25 ENCOUNTER — Other Ambulatory Visit: Payer: Self-pay | Admitting: Family Medicine

## 2015-04-27 ENCOUNTER — Ambulatory Visit (INDEPENDENT_AMBULATORY_CARE_PROVIDER_SITE_OTHER): Payer: Self-pay | Admitting: Family Medicine

## 2015-04-27 ENCOUNTER — Encounter: Payer: Self-pay | Admitting: Family Medicine

## 2015-04-27 VITALS — BP 124/82 | HR 80 | Temp 98.0°F | Ht 63.25 in | Wt 182.0 lb

## 2015-04-27 DIAGNOSIS — I10 Essential (primary) hypertension: Secondary | ICD-10-CM

## 2015-04-27 DIAGNOSIS — Z0000 Encounter for general adult medical examination without abnormal findings: Secondary | ICD-10-CM

## 2015-04-27 DIAGNOSIS — E119 Type 2 diabetes mellitus without complications: Secondary | ICD-10-CM

## 2015-04-27 DIAGNOSIS — E1142 Type 2 diabetes mellitus with diabetic polyneuropathy: Secondary | ICD-10-CM

## 2015-04-27 LAB — HEMOGLOBIN A1C: HEMOGLOBIN A1C: 7.2 % — AB (ref 4.6–6.5)

## 2015-04-27 LAB — MICROALBUMIN / CREATININE URINE RATIO
Creatinine,U: 160.4 mg/dL
MICROALB/CREAT RATIO: 1 mg/g (ref 0.0–30.0)
Microalb, Ur: 1.6 mg/dL (ref 0.0–1.9)

## 2015-04-27 MED ORDER — METFORMIN HCL 500 MG PO TABS: 500.0000 mg | ORAL_TABLET | Freq: Two times a day (BID) | ORAL | Status: DC

## 2015-04-27 NOTE — Progress Notes (Signed)
Subjective:    Patient ID: [name], female    DOB: Apr 21, 1961, 54 y.o.   MRN: [id number]  HPI 54 yr old female for a cpx. She feels well. She has had her mammogram. Since we informed her of her lab results she has changed her diet and has lost 6 lbs.    Review of Systems  Constitutional: Negative.   HENT: Negative.   Eyes: Negative.   Respiratory: Negative.   Cardiovascular: Negative.   Gastrointestinal: Negative.   Genitourinary: Negative for dysuria, urgency, frequency, hematuria, flank pain, decreased urine volume, enuresis, difficulty urinating, pelvic pain and dyspareunia.  Musculoskeletal: Negative.   Skin: Negative.   Neurological: Negative.   Psychiatric/Behavioral: Negative.        Objective:   Physical Exam  Constitutional: She is oriented to person, place, and time. She appears well-developed and well-nourished. No distress.  HENT:  Head: Normocephalic and atraumatic.  Right Ear: External ear normal.  Left Ear: External ear normal.  Nose: Nose normal.  Mouth/Throat: Oropharynx is clear and moist. No oropharyngeal exudate.  Eyes: Conjunctivae and EOM are normal. Pupils are equal, round, and reactive to light. No scleral icterus.  Neck: Normal range of motion. Neck supple. No JVD present. No thyromegaly present.  Cardiovascular: Normal rate, regular rhythm, normal heart sounds and intact distal pulses.  Exam reveals no gallop and no friction rub.   No murmur heard. EKG normal   Pulmonary/Chest: Effort normal and breath sounds normal. No respiratory distress. She has no wheezes. She has no rales. She exhibits no tenderness.  Abdominal: Soft. Bowel sounds are normal. She exhibits no distension and no mass. There is no tenderness. There is no rebound and no guarding.  Musculoskeletal: Normal range of motion. She exhibits no edema or tenderness.  Lymphadenopathy:    She has no cervical adenopathy.  Neurological: She is alert and oriented to person, place, and  time. She has normal reflexes. No cranial nerve deficit. She exhibits normal muscle tone. Coordination normal.  Skin: Skin is warm and dry. No rash noted. No erythema.  Psychiatric: She has a normal mood and affect. Her behavior is normal. Judgment and thought content normal.          Assessment & Plan:  Well exam. She has newly diagnosed type 2 diabetes. We will check a baseline A1c today. She will obtain a glucometer and will check her glucoses once every day. Refer to Nutrition for dietary advice. Start on Metformin 500 mg bid.recheck in one month/

## 2015-04-27 NOTE — Progress Notes (Signed)
Pre visit review using our clinic review tool, if applicable. No additional management support is needed unless otherwise documented below in the visit note. 

## 2015-04-29 ENCOUNTER — Telehealth: Payer: Self-pay | Admitting: Family Medicine

## 2015-04-29 LAB — HM DIABETES EYE EXAM

## 2015-04-29 NOTE — Telephone Encounter (Signed)
I spoke with pt and gave below directions.

## 2015-04-29 NOTE — Telephone Encounter (Signed)
For the time being, tell her to take only 1/2 of a metformin bid and see how that goes. The diarrhea usually gets better over time

## 2015-04-29 NOTE — Telephone Encounter (Signed)
Please advise what MD Sarajane Jews recommends. Will be happy to communicate with patient per his direction.

## 2015-04-29 NOTE — Telephone Encounter (Signed)
[facility] - Client [facility] Patient Name: [name] DOB: 06/12/1961 Initial Comment Caller states she has Diarrhea. Nurse Assessment Nurse: Markus Daft, RN, Sherre Poot Date/Time (Eastern Time): 04/29/2015 11:28:47 AM Confirm and document reason for call. If symptomatic, describe symptoms. ---Caller states that she is having watery diarrhea since Thursday after starting Metformin for DM on Wednesday. She has had 2 episodes in last 24 hours. She was having abdominal pain which is relieved with diarrhea. No fever. Has the patient traveled out of the country within the last 30 days? ---No Does the patient require triage? ---Yes Related visit to physician within the last 2 weeks? ---Yes Does the PT have any chronic conditions? (i.e. diabetes, asthma, etc.) ---Yes List chronic conditions. ---NIDDM, Asthma, HTN Did the patient indicate they were pregnant? ---No Guidelines Guideline Title Affirmed Question Affirmed Notes Diarrhea Mild diarrhea (all triage questions negative) Final Disposition User Trimble, RN, [place] RN also advised while Metformin can cause some diarrhea for s.e. usually not watery diarrhea, and may need to try taking a few more days to see if this persists or if it is viral (per [facility] - and RN clinical exp). Office: please call pt back if further advice on her medications.

## 2015-05-23 ENCOUNTER — Other Ambulatory Visit: Payer: Self-pay | Admitting: Family Medicine

## 2015-05-27 ENCOUNTER — Encounter: Payer: Self-pay | Admitting: Family Medicine

## 2015-05-27 ENCOUNTER — Ambulatory Visit (INDEPENDENT_AMBULATORY_CARE_PROVIDER_SITE_OTHER): Payer: Self-pay | Admitting: Family Medicine

## 2015-05-27 VITALS — BP 110/74 | HR 72 | Temp 97.8°F | Ht 63.25 in | Wt 183.8 lb

## 2015-05-27 DIAGNOSIS — I10 Essential (primary) hypertension: Secondary | ICD-10-CM

## 2015-05-27 DIAGNOSIS — E1142 Type 2 diabetes mellitus with diabetic polyneuropathy: Secondary | ICD-10-CM

## 2015-05-27 LAB — GLUCOSE, POCT (MANUAL RESULT ENTRY): POC GLUCOSE: 137 mg/dL — AB (ref 70–99)

## 2015-05-27 MED ORDER — AMLODIPINE BESYLATE 10 MG PO TABS: 5.0000 mg | ORAL_TABLET | Freq: Every day | ORAL | Status: DC

## 2015-05-27 MED ORDER — METFORMIN HCL 500 MG PO TABS: 250.0000 mg | ORAL_TABLET | Freq: Two times a day (BID) | ORAL | Status: DC

## 2015-05-27 NOTE — Progress Notes (Signed)
Subjective:    Patient ID: [name], female    DOB: 19-Aug-1961, 55 y.o.   MRN: [id number]  HPI Here to follow up on diabetes and HTN. She has adjusted quite well with this new problem and she has changed her diet accordingly. Her am fasting glucoses have been running from 80 to 110 consistently. She has decreased the dose of metformin to 1/2 tab bid. Her first meeting with Nutrition is in a few weeks. Her BP has also come down and she is getting readings in the 110s over 70s. She is working out at [employer] most days. She feels better and has more energy.    Review of Systems  Constitutional: Negative.   Respiratory: Negative.   Cardiovascular: Negative.   Endocrine: Negative.   Neurological: Negative.        Objective:   Physical Exam  Constitutional: She appears well-developed and well-nourished.  Neck: No thyromegaly present.  Cardiovascular: Normal rate, regular rhythm, normal heart sounds and intact distal pulses.   Pulmonary/Chest: Effort normal and breath sounds normal.  Lymphadenopathy:    She has no cervical adenopathy.          Assessment & Plan:  She is doing very well. We will have her decrease the Amlodipine to 1/2 tab a day ( or 5 mg) and recheck in one month.

## 2015-05-27 NOTE — Progress Notes (Signed)
Pre visit review using our clinic review tool, if applicable. No additional management support is needed unless otherwise documented below in the visit note. 

## 2015-06-07 ENCOUNTER — Encounter: Payer: Self-pay | Admitting: Dietician

## 2015-06-07 ENCOUNTER — Encounter: Payer: Self-pay | Attending: Family Medicine | Admitting: Dietician

## 2015-06-07 VITALS — Ht 63.0 in | Wt 184.5 lb

## 2015-06-07 DIAGNOSIS — Z713 Dietary counseling and surveillance: Secondary | ICD-10-CM | POA: Insufficient documentation

## 2015-06-07 DIAGNOSIS — E1142 Type 2 diabetes mellitus with diabetic polyneuropathy: Secondary | ICD-10-CM | POA: Insufficient documentation

## 2015-06-07 NOTE — Patient Instructions (Signed)
Be mindful about what you drink.  It is best to drink beverages without carbohydrates. Be careful about the amount of fat that you use in cooking.   Aim for 2-3 Carb Choices per meal (30-45 grams) +/- 1 either way  Aim for 0-1 Carbs per snack if hungry  Include protein in moderation with your meals and snacks Consider reading food labels for Total Carbohydrate and Fat Grams of foods Continue your activity level  for 60 minutes daily as tolerated.  Increase walking or dancing as tolerated.  Consider swim lessons. Consider checking BG at alternate times per day as directed by MD  Consider taking medication as directed by MD

## 2015-06-07 NOTE — Progress Notes (Signed)
Diabetes Self-Management Education  Visit Type: First/Initial  Appt. Start Time: 1430 Appt. End Time: 1600  06/07/2015  [name], identified by name and date of birth, is a 54 y.o. female with a diagnosis of Diabetes: Type 2.  Other people present during visit:  Patient   Patient lives with her husband.  She is quite active but limited at times due to injury.  She is newly diagnosed with type 2 diabetes.  1 month ago weight was 185 lbs and has maintained at about 182 lbs.  160 lbs about 2 years ago with 18% body fat per patient.  She was depressed for about 2 weeks after DM diagnosis.  States that she is motivated to learn.  She eats at more regular times, is aware of food choices and has decreased sweet intake since diagnosis.  States that she drinks increased fluids before bed.  This has changed minimally.  She is not employed.  TANITA  BODY COMP RESULTS 06/07/15 184.5   BMI (kg/m^2) 32.7   Fat Mass (lbs) 76   Fat Free Mass (lbs) 108.5   Total Body Water (lbs) 79.5     ASSESSMENT  Height 5\' 3"  (1.6 m), weight 184 lb 8 oz (83.689 kg). Body mass index is 32.69 kg/(m^2).  Initial Visit Information:  Are you currently following a meal plan?: No   Are you taking your medications as prescribed?: Yes Are you checking your feet?: No   How often do you need to have someone help you when you read instructions, pamphlets, or other written materials from your doctor or pharmacy?: 1 - Never What is the last grade level you completed in school?: some college  Psychosocial:     Patient Belief/Attitude about Diabetes: Motivated to manage diabetes Self-care barriers: None Self-management support: Doctor's office, Family Other persons present: Patient Patient Concerns: Nutrition/Meal planning, Healthy Lifestyle, Problem Solving (balance to avoid the lows) Special Needs: None Preferred Learning Style: No preference indicated Learning Readiness: Ready  Complications:   Last  HgB A1C per patient/outside source: 7.2 mg/dL (04/27/15) How often do you check your blood sugar?: 1-2 times/day Fasting Blood glucose range (mg/dL): 70-129 Postprandial Blood glucose range (mg/dL): 70-129 Number of hypoglycemic episodes per month: 2 Number of hyperglycemic episodes per week: 0 Have you had a dilated eye exam in the past 12 months?: Yes Have you had a dental exam in the past 12 months?: Yes  Diet Intake:  Breakfast: protein shake with kale Snack (morning): berries or raw veges or almond butter on [employer] bread Lunch: protein shake with kale every other week or cucumbers, chicken or fish and 1 slice [employer] bread and fresh collards cooked in coconut oil/olive oil Snack (afternoon): berries or raw veges or almond butter on [employer] bread Dinner: chicken or fish, collards, 1 slice [employer] bread, or brown rice, or dried beans, 1 small sweet daily  Snack (evening): berries or raw veges or almond butter on [employer] bread Beverage(s): water, rare regular soda, lactose free milk occasionally. occasional sweet tea  Exercise:  Exercise: Light (walking / raking leaves) (8-10 am walking 1 mile on treadmill, weight lifting, afternoon walks 1 mile almost every day. Dancing at times.) Light Exercise amount of time (min / week): 150  Individualized Plan for Diabetes Self-Management Training:   Learning Objective:  Patient will have a greater understanding of diabetes self-management.  Patient education plan per assessed needs and concerns is to attend individual sessions for     Education Topics Reviewed with Patient  Today:  Definition of diabetes, type 1 and 2, and the diagnosis of diabetes Role of diet in the treatment of diabetes and the relationship between the three main macronutrients and blood glucose level, Carbohydrate counting, Food label reading, portion sizes and measuring food., Reviewed blood glucose goals for pre and post meals and how to evaluate the patients' food intake on their blood glucose  level., Meal options for control of blood glucose level and chronic complications. Role of exercise on diabetes management, blood pressure control and cardiac health.   Interpreting lab values - A1C, lipid, urine microalbumina., Identified appropriate SMBG and/or A1C goals., Daily foot exams, Yearly dilated eye exam   Lipid levels, blood glucose control and heart disease, Dental care, Assessed and discussed foot care and prevention of foot problems, Relationship between chronic complications and blood glucose control, Retinopathy and reason for yearly dilated eye exams, Reviewed with patient heart disease, higher risk of, and prevention Role of stress on diabetes      PATIENTS GOALS/Plan (Developed by the patient):  Nutrition: Follow meal plan discussed, General guidelines for healthy choices and portions discussed Physical Activity: Exercise 5-7 days per week, 60 minutes per day Medications: take my medication as prescribed Reducing Risk: examine blood glucose patterns, do foot checks daily  Plan:   Patient Instructions  Be mindful about what you drink.  It is best to drink beverages without carbohydrates. Be careful about the amount of fat that you use in cooking.   Aim for 2-3 Carb Choices per meal (30-45 grams) +/- 1 either way  Aim for 0-1 Carbs per snack if hungry  Include protein in moderation with your meals and snacks Consider reading food labels for Total Carbohydrate and Fat Grams of foods Continue your activity level  for 60 minutes daily as tolerated.  Increase walking or dancing as tolerated.  Consider swim lessons. Consider checking BG at alternate times per day as directed by MD  Consider taking medication as directed by MD      Expected Outcomes:  Demonstrated interest in learning. Expect positive outcomes  Education material provided: Living Well with Diabetes, Food label handouts, A1C conversion sheet, Meal plan card, My Plate and Snack sheet, Breakfast  sheet  If problems or questions, patient to contact team via:  Phone and Email  Future DSME appointment: PRN

## 2015-06-21 ENCOUNTER — Other Ambulatory Visit (HOSPITAL_COMMUNITY): Payer: Self-pay | Admitting: *Deleted

## 2015-06-21 DIAGNOSIS — N6310 Unspecified lump in the right breast, unspecified quadrant: Secondary | ICD-10-CM

## 2015-06-22 ENCOUNTER — Ambulatory Visit: Payer: Self-pay | Admitting: Family Medicine

## 2015-06-23 ENCOUNTER — Ambulatory Visit
Admission: RE | Admit: 2015-06-23 | Discharge: 2015-06-23 | Disposition: A | Discharge status: Home / Self Care | Payer: No Typology Code available for payment source | Source: Ambulatory Visit | Attending: Obstetrics and Gynecology | Admitting: Obstetrics and Gynecology

## 2015-06-23 ENCOUNTER — Ambulatory Visit (HOSPITAL_COMMUNITY)
Admission: RE | Admit: 2015-06-23 | Discharge: 2015-06-23 | Disposition: A | Discharge status: Home / Self Care | Payer: Self-pay | Source: Ambulatory Visit | Attending: Obstetrics and Gynecology | Admitting: Obstetrics and Gynecology

## 2015-06-23 ENCOUNTER — Encounter (HOSPITAL_COMMUNITY): Payer: Self-pay

## 2015-06-23 VITALS — BP 126/84 | Temp 98.3°F | Ht 63.0 in | Wt 183.0 lb

## 2015-06-23 DIAGNOSIS — N6310 Unspecified lump in the right breast, unspecified quadrant: Secondary | ICD-10-CM

## 2015-06-23 DIAGNOSIS — N6315 Unspecified lump in the right breast, overlapping quadrants: Secondary | ICD-10-CM

## 2015-06-23 DIAGNOSIS — Z1239 Encounter for other screening for malignant neoplasm of breast: Secondary | ICD-10-CM

## 2015-06-23 NOTE — Progress Notes (Signed)
CLINIC:  [facility]) Clinic  REASON FOR VISIT: Well-woman exam and diagnostic mammogram.    HISTORY OF PRESENT ILLNESS:  [name] is a 54 y.o. female who presents to the [facility] today for clinical breast exam. No family history of breast cancer. Her last mammogram was on 06/16/14 and biopsy was recommended.  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 Today, she reports having right breast pain that is intermittent and as high as 5/10 on pain scale.  She states that she felt a lump in the right breast, near the site of her previously breast biopsy. Her last pap smear was in 05/2014 and was negative.  She has no history of abnormal pap smears.   REVIEW OF SYSTEMS:  Denies any breast pain, nodularity, nipple inversion, or nipple discharge bilaterally.   ALLERGIES: Allergies  Allergen Reactions  . Hctz [Hydrochlorothiazide] Other (See Comments)    Blood pressure goes up  . Peanut-Containing Drug Products Anaphylaxis  . Ciprofloxacin Diarrhea  . Shellfish-Derived Products Other (See Comments)     Per allergy test    CURRENT MEDICATIONS:  Current Outpatient Prescriptions on File Prior to Encounter  Medication Sig Dispense Refill  . amLODipine (NORVASC) 10 MG tablet Take 0.5 tablets (5 mg total) by mouth daily. 90 tablet 1  . cholecalciferol (VITAMIN D) 1000 UNITS tablet Take 1,000 Units by mouth daily.     Marland Kitchen EPINEPHrine (EPIPEN 2-PAK) 0.3 mg/0.3 mL SOAJ injection Inject 0.3 mLs (0.3 mg total) into the muscle once. 1 Device 11  . furosemide (LASIX) 20 MG tablet TAKE 1 TABLET (20 MG TOTAL) BY MOUTH DAILY. 90 tablet 2  . lisinopril (PRINIVIL,ZESTRIL) 20 MG tablet TAKE 1 TABLET (20 MG TOTAL) BY MOUTH DAILY. 90 tablet 2  . metFORMIN (GLUCOPHAGE) 500 MG tablet Take 0.5 tablets (250 mg total) by mouth 2 (two) times daily with a meal. 60 tablet 11  . Multiple Vitamins-Minerals (MULTIVITAMIN WITH MINERALS) tablet Take 1 tablet by mouth daily.     Marland Kitchen omeprazole (PRILOSEC) 40 MG capsule Take 1 capsule (40 mg total) by mouth daily. 90 capsule 3  . MAGNESIUM PO Take by mouth daily.    . meloxicam (MOBIC) 15 MG tablet Take 1 tablet (15 mg total) by mouth daily. (Patient not taking: Reported on 05/27/2015) 30 tablet 5  . ondansetron (ZOFRAN) 8 MG tablet Take 1 tablet (8 mg total) by mouth every 8 (eight) hours as needed for nausea or vomiting. (Patient not taking: Reported on 05/27/2015) 30 tablet 1  . OVER THE COUNTER MEDICATION Black cohash (hot flashes)    . POTASSIUM PO Take by mouth daily.    . TURMERIC PO Take by mouth daily.     No current facility-administered medications on file prior to encounter.     PHYSICAL EXAM:  Vitals:  Filed Vitals:   06/23/15 1058  BP: 126/84  Temp: 98.3 F (36.8 C)   General: Well-nourished, well-appearing female in no acute distress.  She is unaccompanied in clinic today.  Stephanie Infante, LPN was present during physical exam for this patient.  Breasts: Bilateral breasts exposed and observed with patient standing (arms at side, arms on hips, arms on hips flexed forward, and arms over head).  No gross abnormalities including breast skin puckering or dimpling noted on observation.  Breasts symmetrical without evidence of skin redness, thickening, or peau d'orange appearance.  No nipple retraction or nipple discharge noted bilaterally.  No breast nodularity palpated in bilateral breasts. The  area of patient concern is in the right breast at 9 o'clock position.  There is no nodularity or mass palpated on physical exam.  Normal fibrocystic breast changes noted in bilateral breasts. Axillary lymph nodes: No axillary lymphadenopathy bilaterally.   GU: Exam deferred. Pap smear is up-to-date.  ASSESSMENT & PLAN:   1. Breast cancer screening: [name] has no palpable breast abnormalities on her clinical breast exam today.  She will receive her diagnostic mammogram as scheduled.  She will be contacted by the  imaging center for results of the mammogram.  She was given instructions and educational materials regarding breast self-awareness. [name] is aware of this plan and agrees with it.    [name] was encouraged to ask questions and all questions were answered to her satisfaction.    Stephanie Craze, [name] [place]  [phone]

## 2015-06-24 ENCOUNTER — Ambulatory Visit: Payer: Self-pay | Admitting: Family Medicine

## 2015-06-29 ENCOUNTER — Encounter: Payer: Self-pay | Admitting: Family Medicine

## 2015-06-29 ENCOUNTER — Ambulatory Visit (INDEPENDENT_AMBULATORY_CARE_PROVIDER_SITE_OTHER): Payer: Self-pay | Admitting: Family Medicine

## 2015-06-29 VITALS — BP 138/86 | HR 64 | Temp 98.3°F | Ht 63.0 in | Wt 184.0 lb

## 2015-06-29 DIAGNOSIS — I10 Essential (primary) hypertension: Secondary | ICD-10-CM

## 2015-06-29 DIAGNOSIS — E1142 Type 2 diabetes mellitus with diabetic polyneuropathy: Secondary | ICD-10-CM

## 2015-06-29 NOTE — Progress Notes (Signed)
Pre visit review using our clinic review tool, if applicable. No additional management support is needed unless otherwise documented below in the visit note. 

## 2015-06-29 NOTE — Progress Notes (Signed)
Subjective:    Patient ID: [name], female    DOB: 07/13/1961, 54 y.o.   MRN: [id number]  HPI Here to follow up on diabetes and HTN. She feels well. She is walking about a mile a day. Her glucoses range from 90 to 110 fasting. She met with Nutrition and found this to be helpful.    Review of Systems  Constitutional: Negative.   Respiratory: Negative.   Cardiovascular: Negative.   Endocrine: Negative.   Neurological: Negative.        Objective:   Physical Exam  Constitutional: She appears well-developed and well-nourished.  Neck: No thyromegaly present.  Cardiovascular: Normal rate, regular rhythm, normal heart sounds and intact distal pulses.   Pulmonary/Chest: Effort normal and breath sounds normal.  Lymphadenopathy:    She has no cervical adenopathy.          Assessment & Plan:  Her HTN and diabetes are doing well. Stay on her current regimen. Recheck with an A1c next month  Subjective:    Patient ID: [name], female    DOB: 07/13/1961, 54 y.o.   MRN: [id number]  HPI Here to follow up on diabetes and HTN. She feels well. She is walking about a mile a day. Her glucoses range from 90 to 110 fasting. She met with Nutrition and found this to be helpful.    Review of Systems  Constitutional: Negative.   Respiratory: Negative.   Cardiovascular: Negative.   Endocrine: Negative.   Neurological: Negative.        Objective:   Physical Exam  Constitutional: She appears well-developed and well-nourished.  Neck: No thyromegaly present.  Cardiovascular: Normal rate, regular rhythm, normal heart sounds and intact distal pulses.   Pulmonary/Chest: Effort normal and breath sounds normal.  Lymphadenopathy:    She has no cervical adenopathy.          Assessment & Plan:  Her HTN and diabetes are doing well. Stay on her current regimen. Recheck with an A1c next month

## 2015-07-12 ENCOUNTER — Ambulatory Visit (INDEPENDENT_AMBULATORY_CARE_PROVIDER_SITE_OTHER): Payer: Self-pay | Admitting: Family Medicine

## 2015-07-12 ENCOUNTER — Encounter: Payer: Self-pay | Admitting: Family Medicine

## 2015-07-12 VITALS — BP 134/97 | HR 79 | Temp 98.6°F | Ht 63.0 in | Wt 179.0 lb

## 2015-07-12 DIAGNOSIS — M26609 Unspecified temporomandibular joint disorder, unspecified side: Principal | ICD-10-CM

## 2015-07-12 DIAGNOSIS — M2660 Temporomandibular joint disorder, unspecified: Secondary | ICD-10-CM

## 2015-07-12 DIAGNOSIS — M26659 Arthropathy of unspecified temporomandibular joint: Secondary | ICD-10-CM

## 2015-07-12 NOTE — Progress Notes (Signed)
Subjective:    Patient ID: [name], female    DOB: 13-Apr-1961, 54 y.o.   MRN: [id number]  HPI Here for 4 days of sharp pains in the right ear and on the right side of the head. No recent trauma. No sinus pressure or ST or fever. Tylenol does not help.    Review of Systems  Constitutional: Negative.   HENT: Positive for ear pain. Negative for congestion, dental problem, ear discharge, facial swelling, hearing loss, rhinorrhea and sinus pressure.   Eyes: Negative.   Neurological: Positive for headaches.       Objective:   Physical Exam  Constitutional: She is oriented to person, place, and time. She appears well-developed and well-nourished.  HENT:  Head: Normocephalic and atraumatic.  Right Ear: External ear normal.  Nose: Nose normal.  Mouth/Throat: Oropharynx is clear and moist.  Eyes: Conjunctivae are normal.  Neck: Neck supple. No thyromegaly present.  Very tender over the right TMJ, full ROM , no crepitus   Cardiovascular: Normal rate, regular rhythm, normal heart sounds and intact distal pulses.   Lymphadenopathy:    She has no cervical adenopathy.  Neurological: She is alert and oriented to person, place, and time. No cranial nerve deficit.          Assessment & Plan:  This is TMJ pain. Rest and eat. No chewing gum. Use 800 mg of Ibuprofen 3-4 times a day prn. See your dentist soon

## 2015-07-12 NOTE — Progress Notes (Signed)
Pre visit review using our clinic review tool, if applicable. No additional management support is needed unless otherwise documented below in the visit note. 

## 2015-07-29 ENCOUNTER — Ambulatory Visit: Payer: Self-pay | Admitting: Family Medicine

## 2015-08-01 ENCOUNTER — Encounter: Payer: Self-pay | Admitting: Family Medicine

## 2015-08-01 ENCOUNTER — Ambulatory Visit (INDEPENDENT_AMBULATORY_CARE_PROVIDER_SITE_OTHER): Payer: Self-pay | Admitting: Family Medicine

## 2015-08-01 VITALS — BP 120/84 | HR 77 | Temp 97.9°F | Ht 63.0 in | Wt 176.0 lb

## 2015-08-01 DIAGNOSIS — E1142 Type 2 diabetes mellitus with diabetic polyneuropathy: Secondary | ICD-10-CM

## 2015-08-01 DIAGNOSIS — I10 Essential (primary) hypertension: Secondary | ICD-10-CM

## 2015-08-01 LAB — HEMOGLOBIN A1C: Hgb A1c MFr Bld: 5.9 % (ref 4.6–6.5)

## 2015-08-01 NOTE — Progress Notes (Signed)
Subjective:    Patient ID: [name], female    DOB: [birth year], 54 y.o.   MRN: [id number]  HPI Here to follow up on DM and HTN. She feels great and she is losing some weight. Her glucoses are controlled and often drop too low. She has had some in the 70s and 80s and felt bad so she ate some food.    Review of Systems  Constitutional: Negative.   Respiratory: Negative.   Cardiovascular: Negative.   Neurological: Negative.        Objective:   Physical Exam  Constitutional: She appears well-developed and well-nourished.  Neck: No thyromegaly present.  Cardiovascular: Normal rate, regular rhythm, normal heart sounds and intact distal pulses.   Pulmonary/Chest: Effort normal and breath sounds normal.  Lymphadenopathy:    She has no cervical adenopathy.          Assessment & Plan:  She is doing well. Her BP is stable. Get an A1c today. We may be able to stop the Metformin.

## 2015-08-01 NOTE — Progress Notes (Signed)
Pre visit review using our clinic review tool, if applicable. No additional management support is needed unless otherwise documented below in the visit note. 

## 2015-08-02 NOTE — Addendum Note (Signed)
Addended by: Aggie Hacker A on: 08/02/2015 11:03 AM   Modules accepted: Orders, Medications

## 2015-08-08 ENCOUNTER — Encounter: Payer: Self-pay | Admitting: Family Medicine

## 2015-08-08 ENCOUNTER — Ambulatory Visit (INDEPENDENT_AMBULATORY_CARE_PROVIDER_SITE_OTHER): Payer: Self-pay | Admitting: Family Medicine

## 2015-08-08 ENCOUNTER — Telehealth: Payer: Self-pay | Admitting: Family Medicine

## 2015-08-08 VITALS — BP 118/77 | HR 67 | Temp 98.5°F | Ht 63.0 in | Wt 176.0 lb

## 2015-08-08 DIAGNOSIS — E1142 Type 2 diabetes mellitus with diabetic polyneuropathy: Secondary | ICD-10-CM

## 2015-08-08 DIAGNOSIS — G629 Polyneuropathy, unspecified: Secondary | ICD-10-CM

## 2015-08-08 MED ORDER — GABAPENTIN 100 MG PO CAPS: 100.0000 mg | ORAL_CAPSULE | Freq: Three times a day (TID) | ORAL | Status: DC

## 2015-08-08 NOTE — Telephone Encounter (Signed)
[facility] Day - Client [place] [facility] Patient Name: [name] DOB: Oct 02, 1961 Initial Comment Caller states if she touches something cold, she itches and it burns. Has been going on for a month. Nurse Assessment Nurse: Donalynn Furlong, RN, Myna Hidalgo Date/Time Eilene Ghazi Time): 08/08/2015 11:46:46 AM Confirm and document reason for call. If symptomatic, describe symptoms. ---Caller states if she touches something cold, she itches and it burns. Has been going on for a month."If I pick up something cold, my hand itches, just as an example". No other neuro/CV changes to report. Seems to be specifically with "cold things" Has the patient traveled out of the country within the last 30 days? ---No Does the patient require triage? ---Yes Related visit to physician within the last 2 weeks? ---Yes Does the PT have any chronic conditions? (i.e. diabetes, asthma, etc.) ---Yes List chronic conditions. ---diabetes, HBP Did the patient indicate they were pregnant? ---No Guidelines Guideline Title Affirmed Question Affirmed Notes Itching - Localized [1] Cause unknown AND [2] present > 7 days Final Disposition User See PCP When Office is Open (within 3 days) Donalynn Furlong, RN, Myna Hidalgo Disagree/Comply: Comply Appt w Dr Sarajane Jews 8/23 at 3:45, Pt called back, made aware

## 2015-08-08 NOTE — Progress Notes (Signed)
Pre visit review using our clinic review tool, if applicable. No additional management support is needed unless otherwise documented below in the visit note. 

## 2015-08-09 ENCOUNTER — Ambulatory Visit: Payer: Self-pay | Admitting: Family Medicine

## 2015-08-09 ENCOUNTER — Encounter: Payer: Self-pay | Admitting: Family Medicine

## 2015-08-09 LAB — VITAMIN B12: Vitamin B-12: 417 pg/mL (ref 211–911)

## 2015-08-09 NOTE — Progress Notes (Signed)
Subjective:    Patient ID: [name], female    DOB: 03-11-1961, 54 y.o.   MRN: [id number]  HPI Here for several weeks of intermittent tingling or burning in the hands and feet. No numbness. No color changes. We recently diagnosed her with diabetes and she has gotten this under good control.    Review of Systems  Constitutional: Negative.   Respiratory: Negative.   Cardiovascular: Negative.   Neurological: Negative.        Objective:   Physical Exam  Constitutional: She is oriented to person, place, and time. She appears well-developed and well-nourished.  Cardiovascular: Normal rate, regular rhythm, normal heart sounds and intact distal pulses.   Pulmonary/Chest: Effort normal and breath sounds normal.  Neurological: She is alert and oriented to person, place, and time. She has normal reflexes. No cranial nerve deficit. She exhibits normal muscle tone. Coordination normal.          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 Try Gabapentin 100 mg tid. Check a B12 level as well.

## 2015-09-20 ENCOUNTER — Encounter: Payer: Self-pay | Admitting: Family Medicine

## 2015-09-20 ENCOUNTER — Ambulatory Visit (INDEPENDENT_AMBULATORY_CARE_PROVIDER_SITE_OTHER): Payer: Self-pay | Admitting: Family Medicine

## 2015-09-20 VITALS — BP 129/91 | HR 70 | Temp 98.3°F | Ht 63.0 in | Wt 169.0 lb

## 2015-09-20 DIAGNOSIS — Z23 Encounter for immunization: Secondary | ICD-10-CM

## 2015-09-20 DIAGNOSIS — M25561 Pain in right knee: Secondary | ICD-10-CM

## 2015-09-20 MED ORDER — MELOXICAM 15 MG PO TABS: 15.0000 mg | ORAL_TABLET | Freq: Every day | ORAL | Status: DC

## 2015-09-20 NOTE — Progress Notes (Signed)
Pre visit review using our clinic review tool, if applicable. No additional management support is needed unless otherwise documented below in the visit note. 

## 2015-09-20 NOTE — Progress Notes (Signed)
Subjective:    Patient ID: [name], female    DOB: 18-Apr-1961, 54 y.o.   MRN: [id number]  HPI Here for 3 weeks of sharp pains in the right knee, especially when she tries to take a step with the foot pointed laterally. No swelling or locking or giving way. No hx of trauma.    Review of Systems  Constitutional: Negative.   Musculoskeletal: Positive for arthralgias and gait problem. Negative for joint swelling.       Objective:   Physical Exam  Constitutional: She appears well-developed and well-nourished.  Limping   Musculoskeletal:  The right knee is tender over the medial joint space. No swelling, no crepitus. ROM is full. Negative anterior drawer and [place].           Assessment & Plan:  Knee pain, possible meniscal tear. Use ice and Meloxicam. Wear a Neoprene support sleeve. Refer to Sports Medicine.

## 2015-09-23 ENCOUNTER — Other Ambulatory Visit (INDEPENDENT_AMBULATORY_CARE_PROVIDER_SITE_OTHER): Payer: Self-pay

## 2015-09-23 ENCOUNTER — Ambulatory Visit (INDEPENDENT_AMBULATORY_CARE_PROVIDER_SITE_OTHER): Payer: Self-pay | Admitting: Family Medicine

## 2015-09-23 ENCOUNTER — Encounter: Payer: Self-pay | Admitting: Family Medicine

## 2015-09-23 VITALS — BP 130/82 | HR 58 | Ht 63.0 in | Wt 172.0 lb

## 2015-09-23 DIAGNOSIS — M222X1 Patellofemoral disorders, right knee: Secondary | ICD-10-CM

## 2015-09-23 DIAGNOSIS — M222X9 Patellofemoral disorders, unspecified knee: Secondary | ICD-10-CM | POA: Insufficient documentation

## 2015-09-23 DIAGNOSIS — M25561 Pain in right knee: Secondary | ICD-10-CM

## 2015-09-23 NOTE — Patient Instructions (Signed)
Good to see you.  Ice 20 minutes 2 times daily. Usually after activity and before bed. Exercises 3 times a week.  Wear brace with activty Avoid deep squats for now.   OK to bike but drop seat pennsaid pinkie amount topically 2 times daily as needed.  See me again in 4 weeks if not perfect

## 2015-09-23 NOTE — Progress Notes (Signed)
[name] [place], [place] [address] Phone: [phone] Subjective:    I'm seeing this patient by the request  of:  FRY,STEPHEN A, MD   CC: Right knee pain  [id number] [name] is a 54 y.o. female coming in with complaint of right knee pain. Patient describes the pain as more of a dull throbbing aching sensation that seems to be an anterior lateral aspect of the knee. Patient notices she has to turn her toe in to be able to walk appropriately. If she tries to walk normal she unfortunate has significant pain more on the lateral aspect of the knee. Patient states that sometimes it feels unstable. Denies any radiation down the leg. Hurts more going up and down stairs. Patient was working on a significant amount and noticed that this seemed to exacerbate the situation. Rates the severity of pain as 5 out of 10. She is now ambulating with a limp secondary to the pain.  Past Medical History  Diagnosis Date  . Depression   . Hypertension   . Genital warts   . Cardiac arrhythmia   . Heart murmur   . Fibroids     uterine sees Dr. Carlota Raspberry in [place]   . Low back pain     has a herniated lumbar disc sees Dr Marlou Sa  . Allergy     Dr Caprice Red  . Irritable bowel syndrome (IBS)   . Diabetes mellitus without complication [facility])    Past Surgical History  Procedure Laterality Date  . Tubal ligation    . Myomectomy      laser ablation of uterine fibroids per Dr Ouida Sills 2000  . Lumbar laminectomy      L3-4 on 02-24-10 per Dr Louanne Skye   . Abdominoplasty  04/2013  . Colonoscopy  02-10-15    per Dr. Hilarie Fredrickson, clear, repeat in 10 yrs    Social History  Substance Use Topics  . Smoking status: Former Smoker -- 0.50 packs/day for 1 years    Types: Cigarettes    Quit date: 12/17/1996  . Smokeless tobacco: Never Used  . Alcohol Use: No   Allergies  Allergen Reactions  . Hctz [Hydrochlorothiazide] Other (See Comments)    Blood pressure goes  up  . Peanut-Containing Drug Products Anaphylaxis  . Ciprofloxacin Diarrhea  . Shellfish-Derived Products Other (See Comments)     Per allergy test   Family History  Problem Relation Age of Onset  . Arthritis      Family Hx  . Diabetes      Family Hx 1st degree relative  . Hyperlipidemia      Family Hx  . Hypertension      Family Hx  . Prostate cancer      Family Hx 1st Degree relative <50  . Stroke      First Degree Female <50  . Diabetes Mother   . Hypertension Mother   . Diabetes Father   . Hypertension Father   . Diabetes Maternal Grandmother   . Hypertension Maternal Grandmother   . Diabetes Maternal Grandfather   . Hypertension Maternal Grandfather   . Diabetes Paternal Grandmother   . Hypertension Paternal Grandmother   . Diabetes Paternal Grandfather   . Hypertension Paternal Grandfather   . Colon cancer Neg Hx         Past medical history, social, surgical and family history all reviewed in electronic medical record.   Review of Systems: No headache, visual changes, nausea,  vomiting, diarrhea, constipation, dizziness, abdominal pain, skin rash, fevers, chills, night sweats, weight loss, swollen lymph nodes, body aches, joint swelling, muscle aches, chest pain, shortness of breath, mood changes.   Objective Blood pressure 130/82, pulse 58, height 5\' 3"  (1.6 m), weight 172 lb (78.019 kg), SpO2 98 %.  General: No apparent distress alert and oriented x3 mood and affect normal, dressed appropriately.  HEENT: Pupils equal, extraocular movements intact  Respiratory: Patient's speak in full sentences and does not appear short of breath  Cardiovascular: No lower extremity edema, non tender, no erythema  Skin: Warm dry intact with no signs of infection or rash on extremities or on axial skeleton.  Abdomen: Soft nontender  Neuro: Cranial nerves II through XII are intact, neurovascularly intact in all extremities with 2+ DTRs and 2+ pulses.  Lymph: No lymphadenopathy of  posterior or anterior cervical chain or axillae bilaterally.  Gait normal with good balance and coordination.  MSK:  Non tender with full range of motion and good stability and symmetric strength and tone of shoulders, elbows, wrist, hip, and ankles bilaterally.  Knee: Normal to inspection with no erythema or effusion or obvious bony abnormalities. Palpation normal with no warmth, joint line tenderness, patellar tenderness, or condyle tenderness. ROM full in flexion and extension and lower leg rotation. Ligaments with solid consistent endpoints including ACL, PCL, LCL, MCL. Negative Mcmurray's, Apley's, and Thessalonian tests. Non painful patellar compression. Patellar glide without crepitus. Patellar and quadriceps tendons unremarkable. Hamstring and quadriceps strength is normal.    MSK US performed of: Right This study was ordered, performed, and interpreted by Charlann Boxer D.O.  Knee: All structures visualized. Narrowing of the patellofemoral joint noted with mild effusion Anteromedial, anterolateral, posteromedial, and posterolateral menisci unremarkable without tearing, fraying, effusion, or displacement. Patellar Tendon unremarkable on long and transverse views without effusion. No abnormality of prepatellar bursa. LCL and MCL unremarkable on long and transverse views. No abnormality of origin of medial or lateral head of the gastrocnemius.  IMPRESSION:  Patellofemoral syndrome  Procedure note [address]; 15 minutes spent for Therapeutic exercises as stated in above notes.  This included exercises focusing on stretching, strengthening, with significant focus on eccentric aspects.  Flexion and extension exercises that focus on the vastus medialis oblique muscle as well as the hip abductors. Patient was given flexion and extension exercises. Proper technique shown and discussed handout in great detail with ATC.  All questions were discussed and answered.     Impression and  Recommendations:     This case required medical decision making of moderate complexity.

## 2015-09-23 NOTE — Assessment & Plan Note (Signed)
Patellofemoral Syndrome  Reviewed anatomy using anatomical model and how PFS occurs.  Given rehab exercises handout for VMO, hip abductors, core, entire kinetic chain including proprioception exercises including cone touches, step downs, hip elevations and turn outs.  Could benefit from PT, regular exercise, upright biking, and a PFS knee brace to assist with tracking abnormalities. If worsening symptoms in 3-4 weeks we'll consider injection.

## 2015-09-23 NOTE — Progress Notes (Signed)
Pre visit review using our clinic review tool, if applicable. No additional management support is needed unless otherwise documented below in the visit note. 

## 2015-10-05 ENCOUNTER — Ambulatory Visit: Payer: Self-pay | Admitting: Family Medicine

## 2015-10-07 ENCOUNTER — Ambulatory Visit: Payer: Self-pay | Admitting: Family Medicine

## 2015-10-21 ENCOUNTER — Ambulatory Visit: Payer: Self-pay | Admitting: Family Medicine

## 2015-10-21 DIAGNOSIS — Z0289 Encounter for other administrative examinations: Secondary | ICD-10-CM

## 2015-10-31 ENCOUNTER — Other Ambulatory Visit: Payer: Self-pay | Admitting: Family Medicine

## 2016-03-07 ENCOUNTER — Telehealth: Payer: Self-pay | Admitting: Family Medicine

## 2016-03-07 ENCOUNTER — Ambulatory Visit: Payer: Self-pay | Admitting: Family Medicine

## 2016-03-07 NOTE — Telephone Encounter (Signed)
fyi

## 2016-03-07 NOTE — Telephone Encounter (Signed)
Patient Name: [name]  DOB: 04-01-1961    Initial Comment Needs 3rd Attempt at 1310 Caller states was in the hospital this weekend, for weakness , still having weakness    Nurse Assessment      Guidelines    Guideline Title Affirmed Question Affirmed Notes       Final Disposition User   FINAL ATTEMPT MADE - message left Raphael Gibney, RN, Vanita Ingles

## 2016-03-09 ENCOUNTER — Ambulatory Visit (INDEPENDENT_AMBULATORY_CARE_PROVIDER_SITE_OTHER): Payer: Self-pay | Admitting: Family Medicine

## 2016-03-09 ENCOUNTER — Encounter: Payer: Self-pay | Admitting: Family Medicine

## 2016-03-09 VITALS — BP 118/82 | HR 68 | Temp 97.6°F | Ht 63.0 in | Wt 171.0 lb

## 2016-03-09 DIAGNOSIS — I10 Essential (primary) hypertension: Secondary | ICD-10-CM

## 2016-03-09 DIAGNOSIS — K219 Gastro-esophageal reflux disease without esophagitis: Secondary | ICD-10-CM

## 2016-03-09 DIAGNOSIS — E1142 Type 2 diabetes mellitus with diabetic polyneuropathy: Secondary | ICD-10-CM

## 2016-03-09 LAB — BASIC METABOLIC PANEL
BUN: 14 mg/dL (ref 6–23)
CALCIUM: 10.2 mg/dL (ref 8.4–10.5)
CHLORIDE: 105 meq/L (ref 96–112)
CO2: 28 meq/L (ref 19–32)
CREATININE: 0.79 mg/dL (ref 0.40–1.20)
GFR: 97.87 mL/min (ref >60.00)
Glucose, Bld: 106 mg/dL — ABNORMAL HIGH (ref 70–99)
Potassium: 4 mEq/L (ref 3.5–5.1)
Sodium: 141 mEq/L (ref 135–145)

## 2016-03-09 LAB — MAGNESIUM: Magnesium: 2.3 mg/dL (ref 1.5–2.5)

## 2016-03-09 LAB — HEMOGLOBIN A1C: Hgb A1c MFr Bld: 6.4 % (ref 4.6–6.5)

## 2016-03-09 MED ORDER — POTASSIUM CHLORIDE CRYS ER 20 MEQ PO TBCR: 20.0000 meq | EXTENDED_RELEASE_TABLET | Freq: Every day | ORAL | Status: DC

## 2016-03-09 MED ORDER — OMEPRAZOLE 40 MG PO CPDR: 40.0000 mg | DELAYED_RELEASE_CAPSULE | Freq: Every day | ORAL | Status: DC

## 2016-03-09 NOTE — Progress Notes (Signed)
Pre visit review using our clinic review tool, if applicable. No additional management support is needed unless otherwise documented below in the visit note. 

## 2016-03-09 NOTE — Progress Notes (Signed)
Subjective:    Patient ID: [name], female    DOB: 01/01/1961, 55 y.o.   MRN: [id number]  HPI Here to follow up a hospital stay at a facility in [place], [place] from 03-03-16 to 03-05-16 for an episode of chest pains. She was visiting relatives when she felt the sudden onset of squeezing type pains in the chest and back which radiated down both arms. She also had some sweats and some diffuse muscle cramps. No SOB. Of note she had been taking Omeprazole for GERD until she stopped taking this a year ago. At the ER her labs showed low potassium and low magnesium, but her cardiac enzymes were negative. She was kept and monitored closely. She even had a stress test that was negative for ischemia. She was given IV fluids and her electrolytes were repleted, and she quickly felt better. She was sent home on her original medications and told to see [place]. Since getting home she has ad no further chest pains, though she has had some mild heartburn.    Review of Systems  Constitutional: Negative.   Respiratory: Negative.   Cardiovascular: Positive for chest pain. Negative for palpitations and leg swelling.  Endocrine: Negative.   Genitourinary: Negative.   Neurological: Negative.        Objective:   Physical Exam  Constitutional: She is oriented to person, place, and time. She appears well-developed and well-nourished.  Neck: No thyromegaly present.  Cardiovascular: Normal rate, regular rhythm, normal heart sounds and intact distal pulses.   No murmur heard. Pulmonary/Chest: Effort normal and breath sounds normal. No respiratory distress. She has no wheezes. She has no rales. She exhibits no tenderness.  Abdominal: Soft. Bowel sounds are normal. She exhibits no distension and no mass. There is no tenderness. There is no rebound and no guarding.  Lymphadenopathy:    She has no cervical adenopathy.  Neurological: She is alert and oriented to person, place, and time.          Assessment  & Plan:  Her episode of chest pain was likely due to esophageal spasms resulting from uncontrolled GERD. She will get back on Omeprazole 40 mg every day. Her low potassium was likely a side effect of the Lasix she takes, so she will start taking 20 mEq of potassium daily. Drink plenty of fluids. Add 400 mg of OTC magnesium daily. Check another BMET

## 2016-03-14 ENCOUNTER — Ambulatory Visit: Payer: Self-pay | Admitting: Family Medicine

## 2016-03-16 ENCOUNTER — Telehealth: Payer: Self-pay | Admitting: Family Medicine

## 2016-03-16 NOTE — Telephone Encounter (Signed)
Pt called again would like for someone to call her back today with her lab results she is very concerned about the results

## 2016-03-16 NOTE — Telephone Encounter (Signed)
I spoke with pt and went over results. 

## 2016-03-16 NOTE — Telephone Encounter (Signed)
Pt would like blood work results °

## 2016-05-21 ENCOUNTER — Other Ambulatory Visit (HOSPITAL_COMMUNITY): Payer: Self-pay | Admitting: *Deleted

## 2016-05-21 DIAGNOSIS — N63 Unspecified lump in unspecified breast: Secondary | ICD-10-CM

## 2016-06-07 ENCOUNTER — Ambulatory Visit
Admission: RE | Admit: 2016-06-07 | Discharge: 2016-06-07 | Disposition: A | Discharge status: Home / Self Care | Payer: No Typology Code available for payment source | Source: Ambulatory Visit | Attending: Obstetrics and Gynecology | Admitting: Obstetrics and Gynecology

## 2016-06-07 ENCOUNTER — Ambulatory Visit (HOSPITAL_COMMUNITY)
Admission: RE | Admit: 2016-06-07 | Discharge: 2016-06-07 | Disposition: A | Discharge status: Home / Self Care | Payer: Self-pay | Source: Ambulatory Visit | Attending: Obstetrics and Gynecology | Admitting: Obstetrics and Gynecology

## 2016-06-07 ENCOUNTER — Encounter (HOSPITAL_COMMUNITY): Payer: Self-pay

## 2016-06-07 VITALS — BP 130/82 | Temp 99.2°F | Ht 63.75 in | Wt 175.2 lb

## 2016-06-07 DIAGNOSIS — N6321 Unspecified lump in the left breast, upper outer quadrant: Secondary | ICD-10-CM

## 2016-06-07 DIAGNOSIS — Z1239 Encounter for other screening for malignant neoplasm of breast: Secondary | ICD-10-CM

## 2016-06-07 DIAGNOSIS — N63 Unspecified lump in unspecified breast: Secondary | ICD-10-CM

## 2016-06-07 NOTE — Patient Instructions (Addendum)
Educational materials on self breast awareness given. Explained to [name] that she did not need a Pap smear today due to last Pap smear was 05/24/2014.  Let her know [facility] will cover Pap smears every 3 years unless has a history of abnormal Pap smears. Referred patient to the [facility] for left breast diagnostic mammogram. Appointment scheduled for Thursday, June 07, 2016 at 1440. Patient aware of appointment and will be there. [name] verbalized understanding.  Jonet Mathies, Arvil Chaco, RN 2:55 PM

## 2016-06-07 NOTE — Progress Notes (Addendum)
Complaints of left breast lump x 2 months.  Pap Smear:  Pap smear not completed today. Last Pap smear was 05/24/2014 at the free cervical cancer screening at [facility] sponsored by the [facility] and normal per patient. Per patient has no history of an abnormal Pap smear. Last Pap smear result is not in EPIC. Previous Pap smear result on 01/24/2012 is normal and in EPIC.  Physical exam: Breasts Breasts symmetrical. No skin abnormalities bilateral breasts. No nipple retraction bilateral breasts. No nipple discharge bilateral breasts. No lymphadenopathy. No lumps palpated right breast. Palpated a pea sized lump within the left breast at 1 o'clock 9 cm from the nipple. Complaints of tenderness when palpated lump. Referred patient to the [facility] for left breast diagnostic mammogram. Appointment scheduled for Thursday, June 07, 2016 at 1440.  Pelvic/Bimanual No Pap smear completed today since last Pap smear was 05/24/2014. Pap smear not indicated per [facility] guidelines.   Smoking History: Patient has never smoked.  Patient Navigation: Patient education provided. Access to services provided for patient through [facility] program.   Colorectal Cancer Screening: Patient had a colonoscopy completed one year ago. No complaints today.

## 2016-06-07 NOTE — Addendum Note (Signed)
Encounter addended by: Loletta Parish, RN on: 06/07/2016  3:18 PM<BR>     Documentation filed: ED Follow-Up, Patient Instructions Section, Notes Section

## 2016-06-11 ENCOUNTER — Encounter (HOSPITAL_COMMUNITY): Payer: Self-pay | Admitting: *Deleted

## 2016-06-23 ENCOUNTER — Other Ambulatory Visit: Payer: Self-pay | Admitting: Family Medicine

## 2016-06-25 NOTE — Telephone Encounter (Signed)
Refill sent to pharmacy.   

## 2016-07-30 ENCOUNTER — Telehealth: Payer: Self-pay | Admitting: Family Medicine

## 2016-07-30 NOTE — Telephone Encounter (Signed)
Pt needs cpx before aug 31-17. Pt needs form completed for [facility]. Can I create 30 min slot?

## 2016-07-30 NOTE — Telephone Encounter (Signed)
Okay to schedule

## 2016-07-30 NOTE — Telephone Encounter (Signed)
Pt has been sch

## 2016-08-03 ENCOUNTER — Other Ambulatory Visit (INDEPENDENT_AMBULATORY_CARE_PROVIDER_SITE_OTHER): Payer: Self-pay

## 2016-08-03 VITALS — BP 114/81 | HR 77 | Temp 97.9°F | Ht 63.75 in | Wt 179.0 lb

## 2016-08-03 DIAGNOSIS — Z0000 Encounter for general adult medical examination without abnormal findings: Secondary | ICD-10-CM

## 2016-08-03 LAB — LIPID PANEL
CHOLESTEROL: 275 mg/dL — AB (ref 0–200)
HDL: 46.2 mg/dL (ref >39.00)
LDL Cholesterol: 208 mg/dL — ABNORMAL HIGH (ref 0–99)
NonHDL: 229.09
TRIGLYCERIDES: 104 mg/dL (ref 0.0–149.0)
Total CHOL/HDL Ratio: 6
VLDL: 20.8 mg/dL (ref 0.0–40.0)

## 2016-08-03 LAB — TSH: TSH: 1.5 u[IU]/mL (ref 0.35–4.50)

## 2016-08-03 LAB — CBC WITH DIFFERENTIAL/PLATELET
BASOS ABS: 0 10*3/uL (ref 0.0–0.1)
BASOS PCT: 0.3 % (ref 0.0–3.0)
EOS ABS: 0.2 10*3/uL (ref 0.0–0.7)
Eosinophils Relative: 2.5 % (ref 0.0–5.0)
HEMATOCRIT: 40.7 % (ref 36.0–46.0)
Hemoglobin: 13.8 g/dL (ref 12.0–15.0)
LYMPHS ABS: 3 10*3/uL (ref 0.7–4.0)
LYMPHS PCT: 38.4 % (ref 12.0–46.0)
MCHC: 33.9 g/dL (ref 30.0–36.0)
MCV: 86.8 fl (ref 78.0–100.0)
Monocytes Absolute: 0.6 10*3/uL (ref 0.1–1.0)
Monocytes Relative: 7 % (ref 3.0–12.0)
NEUTROS ABS: 4.1 10*3/uL (ref 1.4–7.7)
NEUTROS PCT: 51.8 % (ref 43.0–77.0)
PLATELETS: 377 10*3/uL (ref 150.0–400.0)
RBC: 4.69 Mil/uL (ref 3.87–5.11)
RDW: 15 % (ref 11.5–15.5)
WBC: 7.9 10*3/uL (ref 4.0–10.5)

## 2016-08-03 LAB — BASIC METABOLIC PANEL
BUN: 14 mg/dL (ref 6–23)
CALCIUM: 9.6 mg/dL (ref 8.4–10.5)
CHLORIDE: 107 meq/L (ref 96–112)
CO2: 27 meq/L (ref 19–32)
CREATININE: 0.85 mg/dL (ref 0.40–1.20)
GFR: 89.81 mL/min (ref >60.00)
GLUCOSE: 120 mg/dL — AB (ref 70–99)
Potassium: 4 mEq/L (ref 3.5–5.1)
Sodium: 142 mEq/L (ref 135–145)

## 2016-08-03 LAB — HEPATIC FUNCTION PANEL
ALBUMIN: 4.6 g/dL (ref 3.5–5.2)
ALT: 14 U/L (ref 0–35)
AST: 15 U/L (ref 0–37)
Alkaline Phosphatase: 132 U/L — ABNORMAL HIGH (ref 39–117)
BILIRUBIN DIRECT: 0.1 mg/dL (ref 0.0–0.3)
TOTAL PROTEIN: 7.1 g/dL (ref 6.0–8.3)
Total Bilirubin: 0.5 mg/dL (ref 0.2–1.2)

## 2016-08-03 LAB — MICROALBUMIN / CREATININE URINE RATIO
Creatinine,U: 152 mg/dL
MICROALB/CREAT RATIO: 0.5 mg/g (ref 0.0–30.0)

## 2016-08-03 LAB — POC URINALSYSI DIPSTICK (AUTOMATED)
Bilirubin, UA: NEGATIVE
GLUCOSE UA: NEGATIVE
Ketones, UA: NEGATIVE
Leukocytes, UA: NEGATIVE
NITRITE UA: NEGATIVE
PROTEIN UA: NEGATIVE
Spec Grav, UA: 1.02
UROBILINOGEN UA: 0.2
pH, UA: 7

## 2016-08-03 LAB — HEMOGLOBIN A1C: HEMOGLOBIN A1C: 6.3 % (ref 4.6–6.5)

## 2016-08-08 ENCOUNTER — Other Ambulatory Visit: Payer: Self-pay | Admitting: Family Medicine

## 2016-08-08 ENCOUNTER — Ambulatory Visit (INDEPENDENT_AMBULATORY_CARE_PROVIDER_SITE_OTHER): Payer: Self-pay | Admitting: Family Medicine

## 2016-08-08 ENCOUNTER — Encounter: Payer: Self-pay | Admitting: Family Medicine

## 2016-08-08 DIAGNOSIS — Z0000 Encounter for general adult medical examination without abnormal findings: Secondary | ICD-10-CM

## 2016-08-08 DIAGNOSIS — Z23 Encounter for immunization: Secondary | ICD-10-CM

## 2016-08-08 MED ORDER — ALBUTEROL SULFATE HFA 108 (90 BASE) MCG/ACT IN AERS: 2.0000 | INHALATION_SPRAY | RESPIRATORY_TRACT | 0 refills | Status: DC | PRN

## 2016-08-08 MED ORDER — EPINEPHRINE 0.3 MG/0.3ML IJ SOAJ: 0.3000 mg | Freq: Once | INTRAMUSCULAR | 11 refills | Status: AC

## 2016-08-08 MED ORDER — GABAPENTIN 100 MG PO CAPS: 100.0000 mg | ORAL_CAPSULE | Freq: Three times a day (TID) | ORAL | 1 refills | Status: DC

## 2016-08-08 MED ORDER — ATORVASTATIN CALCIUM 20 MG PO TABS: 20.0000 mg | ORAL_TABLET | Freq: Every day | ORAL | 3 refills | Status: DC

## 2016-08-08 MED ORDER — BECLOMETHASONE DIPROPIONATE 80 MCG/ACT IN AERS: 2.0000 | INHALATION_SPRAY | Freq: Every day | RESPIRATORY_TRACT | 12 refills | Status: DC

## 2016-08-08 MED ORDER — AMLODIPINE BESYLATE 10 MG PO TABS: 5.0000 mg | ORAL_TABLET | Freq: Every day | ORAL | 3 refills | Status: DC

## 2016-08-08 MED ORDER — LISINOPRIL 20 MG PO TABS: ORAL_TABLET | ORAL | 3 refills | Status: DC

## 2016-08-08 MED ORDER — OMEPRAZOLE 40 MG PO CPDR: 40.0000 mg | DELAYED_RELEASE_CAPSULE | Freq: Every day | ORAL | 3 refills | Status: DC

## 2016-08-08 MED ORDER — FUROSEMIDE 20 MG PO TABS
ORAL_TABLET | ORAL | 3 refills | Status: DC
Start: 2016-08-08 — End: 2016-12-31

## 2016-08-08 NOTE — Addendum Note (Signed)
Addended by: Aggie Hacker A on: 08/08/2016 09:39 AM   Modules accepted: Orders

## 2016-08-08 NOTE — Progress Notes (Signed)
Subjective:    Patient ID: [name], female    DOB: November 10, 1961, 55 y.o.   MRN: [id number]  HPI 55 yr old female for a well exam. She feels fine except she is having frequent tingling or burning pains in the hands and feet. She has diabetic neuropathy and she used to take Gabapentin for this. However she stopped about 6 months ago. She does take a multivitamin. She had a normal eye exam a month ago. 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 Her diabetes is well controlled and her A1c is 6.3. However her LDL has jumped up to 208.    Review of Systems  Constitutional: Negative.   HENT: Negative.   Eyes: Negative.   Respiratory: Negative.   Cardiovascular: Negative.   Gastrointestinal: Negative.   Genitourinary: Negative for decreased urine volume, difficulty urinating, dyspareunia, dysuria, enuresis, flank pain, frequency, hematuria, pelvic pain and urgency.  Musculoskeletal: Negative.   Skin: Negative.   Neurological: Negative.   Psychiatric/Behavioral: Negative.        Objective:   Physical Exam  Constitutional: She is oriented to person, place, and time. She appears well-developed and well-nourished. No distress.  HENT:  Head: Normocephalic and atraumatic.  Right Ear: External ear normal.  Left Ear: External ear normal.  Nose: Nose normal.  Mouth/Throat: Oropharynx is clear and moist. No oropharyngeal exudate.  Eyes: Conjunctivae and EOM are normal. Pupils are equal, round, and reactive to light. No scleral icterus.  Neck: Normal range of motion. Neck supple. No JVD present. No thyromegaly present.  Cardiovascular: Normal rate, regular rhythm, normal heart sounds and intact distal pulses.  Exam reveals no gallop and no friction rub.   No murmur heard. Pulmonary/Chest: Effort normal and breath sounds normal. No respiratory distress. She has no wheezes. She has no rales. She exhibits no  tenderness.  Abdominal: Soft. Bowel sounds are normal. She exhibits no distension and no mass. There is no tenderness. There is no rebound and no guarding.  Musculoskeletal: Normal range of motion. She exhibits no edema or tenderness.  Lymphadenopathy:    She has no cervical adenopathy.  Neurological: She is alert and oriented to person, place, and time. She has normal reflexes. No cranial nerve deficit. She exhibits normal muscle tone. Coordination normal.  Skin: Skin is warm and dry. No rash noted. No erythema.  Psychiatric: She has a normal mood and affect. Her behavior is normal. Judgment and thought content normal.          Assessment & Plan:  Well exam. We discussed diet and exercise. She will start back on Gabapentin 100 mg tid for the neuropathy. Start on Lipitor 20 mg daily for the lipids. A PPD is placed today.  Nelwyn Salisbury, MD

## 2016-08-09 MED ORDER — GABAPENTIN 100 MG PO CAPS: 100.0000 mg | ORAL_CAPSULE | Freq: Three times a day (TID) | ORAL | 1 refills | Status: DC

## 2016-08-09 NOTE — Addendum Note (Signed)
Addended by: Kateri Mc E on: 08/09/2016 01:01 PM   Modules accepted: Orders

## 2016-08-09 NOTE — Addendum Note (Signed)
Addended by: Kateri Mc E on: 08/09/2016 12:56 PM   Modules accepted: Orders

## 2016-08-10 ENCOUNTER — Ambulatory Visit (INDEPENDENT_AMBULATORY_CARE_PROVIDER_SITE_OTHER): Payer: Self-pay | Admitting: Adult Health

## 2016-08-10 ENCOUNTER — Encounter: Payer: Self-pay | Admitting: Adult Health

## 2016-08-10 VITALS — BP 128/84 | Temp 98.0°F | Wt 178.2 lb

## 2016-08-10 DIAGNOSIS — Z111 Encounter for screening for respiratory tuberculosis: Secondary | ICD-10-CM

## 2016-08-10 DIAGNOSIS — J0100 Acute maxillary sinusitis, unspecified: Secondary | ICD-10-CM

## 2016-08-10 DIAGNOSIS — Z7689 Persons encountering health services in other specified circumstances: Secondary | ICD-10-CM

## 2016-08-10 DIAGNOSIS — R55 Syncope and collapse: Secondary | ICD-10-CM

## 2016-08-10 LAB — TB SKIN TEST
Induration: 0 mm
TB SKIN TEST: NEGATIVE

## 2016-08-10 LAB — POC URINALSYSI DIPSTICK (AUTOMATED)
Bilirubin, UA: NEGATIVE
Glucose, UA: NEGATIVE
KETONES UA: NEGATIVE
Nitrite, UA: NEGATIVE
PH UA: 6
Protein, UA: NEGATIVE
SPEC GRAV UA: 1.02
UROBILINOGEN UA: 0.2

## 2016-08-10 LAB — CBC WITH DIFFERENTIAL/PLATELET
BASOS PCT: 0.5 % (ref 0.0–3.0)
Basophils Absolute: 0 10*3/uL (ref 0.0–0.1)
EOS ABS: 0.2 10*3/uL (ref 0.0–0.7)
EOS PCT: 2.6 % (ref 0.0–5.0)
HCT: 41.3 % (ref 36.0–46.0)
HEMOGLOBIN: 13.9 g/dL (ref 12.0–15.0)
LYMPHS ABS: 3.1 10*3/uL (ref 0.7–4.0)
Lymphocytes Relative: 36.4 % (ref 12.0–46.0)
MCHC: 33.7 g/dL (ref 30.0–36.0)
MCV: 87 fl (ref 78.0–100.0)
MONO ABS: 0.6 10*3/uL (ref 0.1–1.0)
Monocytes Relative: 6.6 % (ref 3.0–12.0)
NEUTROS PCT: 53.9 % (ref 43.0–77.0)
Neutro Abs: 4.6 10*3/uL (ref 1.4–7.7)
Platelets: 386 10*3/uL (ref 150.0–400.0)
RBC: 4.74 Mil/uL (ref 3.87–5.11)
RDW: 14.2 % (ref 11.5–15.5)
WBC: 8.6 10*3/uL (ref 4.0–10.5)

## 2016-08-10 LAB — BASIC METABOLIC PANEL
BUN: 15 mg/dL (ref 6–23)
CO2: 28 mEq/L (ref 19–32)
CREATININE: 0.84 mg/dL (ref 0.40–1.20)
Calcium: 9.2 mg/dL (ref 8.4–10.5)
Chloride: 106 mEq/L (ref 96–112)
GFR: 91.03 mL/min (ref >60.00)
GLUCOSE: 124 mg/dL — AB (ref 70–99)
POTASSIUM: 3.7 meq/L (ref 3.5–5.1)
Sodium: 140 mEq/L (ref 135–145)

## 2016-08-10 NOTE — Patient Instructions (Signed)
It was great meeting you today   I will follow up with you regarding your blood work and CT scan   Rest and stay hydrated. You can use Flonase and Claritin.   Follow up with Dr. Sarajane Jews if your symptoms do not get any better

## 2016-08-10 NOTE — Progress Notes (Signed)
Subjective:    Patient ID: [name], female    DOB: 08/06/1961, 55 y.o.   MRN: [id number]  HPI  55 year old female, patient of Dr. Clent Ridges who presents to the office today for ER follow up. She was seen at [facility]. Per patient she was at work when she had an episode of dizziness and " passing out". She does report that she was told she had LOC but when she woke up " I was told that I was talking out of my head." She does not remember the episode. She does endorse feeling " out of it" in the morning prior to the event. Her blood sugar was 90 and BP 175/100 per EMS sheet. In the hospital her BP was 119/73.   Today she reports that she has a frontal headache ( which has been intermittent for about a month). She also reports a cough and chest congestion with body aches.   She denies any fevers, n/v/d.   She does not have any history of seizures or other syncopal events.   Review of Systems  Constitutional: Negative.   HENT: Positive for congestion, postnasal drip and sinus pressure. Negative for rhinorrhea.   Respiratory: Negative.   Cardiovascular: Negative.   Genitourinary: Negative.   Neurological: Positive for headaches.  All other systems reviewed and are negative.  Past Medical History:  Diagnosis Date  . Allergy    Dr Stevphen [place]  . Cardiac arrhythmia   . Depression   . Diabetes mellitus without complication (HCC)   . Fibroids    uterine sees Dr. Neva Seat in [place]   . Genital warts   . Heart murmur   . Hypertension   . Irritable bowel syndrome (IBS)   . Low back pain    has a herniated lumbar disc sees Dr August Saucer    Social History   Social History  . Marital status: Married    Spouse name: N/A  . Number of children: N/A  . Years of education: N/A   Occupational History  . N/A    Social History Main Topics  . Smoking status: Former Smoker    Packs/day: 0.50    Years: 1.00    Types: Cigarettes    Quit date: 12/17/1996  . Smokeless  tobacco: Never Used  . Alcohol use No  . Drug use: No  . Sexual activity: Yes    Birth control/ protection: None   Other Topics Concern  . Not on file   Social History Narrative  . No narrative on file    Past Surgical History:  Procedure Laterality Date  . ABDOMINOPLASTY  04/2013  . COLONOSCOPY  02-10-15   per Dr. Rhea Belton, clear, repeat in 10 yrs   . LUMBAR LAMINECTOMY     L3-4 on 02-24-10 per Dr Otelia Sergeant   . MYOMECTOMY     laser ablation of uterine fibroids per Dr Dareen Piano 2000  . TUBAL LIGATION      Family History  Problem Relation Age of Onset  . Arthritis      Family Hx  . Diabetes      Family Hx 1st degree relative  . Hyperlipidemia      Family Hx  . Hypertension      Family Hx  . Prostate cancer      Family Hx 1st Degree relative <50  . Stroke      First Degree Female <50  . Diabetes Mother   . Hypertension Mother   .  Diabetes Father   . Hypertension Father   . Diabetes Maternal Grandmother   . Hypertension Maternal Grandmother   . Diabetes Maternal Grandfather   . Hypertension Maternal Grandfather   . Diabetes Paternal Grandmother   . Hypertension Paternal Grandmother   . Diabetes Paternal Grandfather   . Hypertension Paternal Grandfather   . Colon cancer Neg Hx     Allergies  Allergen Reactions  . Hctz [Hydrochlorothiazide] Other (See Comments)    Blood pressure goes up  . Peanut-Containing Drug Products Anaphylaxis  . Ciprofloxacin Diarrhea  . Shellfish-Derived Products Other (See Comments)     Per allergy test    Current Outpatient Prescriptions on File Prior to Visit  Medication Sig Dispense Refill  . albuterol (PROVENTIL HFA;VENTOLIN HFA) 108 (90 Base) MCG/ACT inhaler Inhale 2 puffs into the lungs every 4 (four) hours as needed for wheezing or shortness of breath. 1 Inhaler 0  . amLODipine (NORVASC) 10 MG tablet Take 0.5 tablets (5 mg total) by mouth daily. 90 tablet 3  . atorvastatin (LIPITOR) 20 MG tablet Take 1 tablet (20 mg total) by mouth  daily. 90 tablet 3  . beclomethasone (QVAR) 80 MCG/ACT inhaler Inhale 2 puffs into the lungs daily. 1 Inhaler 12  . cholecalciferol (VITAMIN D) 1000 UNITS tablet Take 1,000 Units by mouth daily.     . furosemide (LASIX) 20 MG tablet TAKE 1 TABLET (20 MG TOTAL) BY MOUTH DAILY. 90 tablet 3  . gabapentin (NEURONTIN) 100 MG capsule Take 1 capsule (100 mg total) by mouth 3 (three) times daily. 270 capsule 1  . lisinopril (PRINIVIL,ZESTRIL) 20 MG tablet TAKE 1 TABLET (20 MG TOTAL) BY MOUTH DAILY. 90 tablet 3  . Multiple Vitamins-Minerals (MULTIVITAMIN WITH MINERALS) tablet Take 1 tablet by mouth daily.    Marland Kitchen omeprazole (PRILOSEC) 40 MG capsule Take 1 capsule (40 mg total) by mouth daily. 90 capsule 3  . potassium chloride SA (K-DUR,KLOR-CON) 20 MEQ tablet Take 1 tablet (20 mEq total) by mouth daily. 90 tablet 3  . PRODIGY NO CODING BLOOD GLUC test strip USE TO TEST BLOOD SUGAR ONCE DAILY 100 each 2   No current facility-administered medications on file prior to visit.     BP 128/84   Temp 98 F (36.7 C) (Oral)   Wt 178 lb 3.2 oz (80.8 kg)   BMI 30.83 kg/m       Objective:   Physical Exam  Constitutional: She is oriented to person, place, and time. She appears well-developed and well-nourished. No distress.  HENT:  Head: Normocephalic and atraumatic.  Right Ear: Hearing, tympanic membrane, external ear and ear canal normal.  Left Ear: Hearing, tympanic membrane, external ear and ear canal normal.  Nose: Rhinorrhea present. No mucosal edema. Right sinus exhibits maxillary sinus tenderness. Left sinus exhibits maxillary sinus tenderness.  Mouth/Throat: Uvula is midline, oropharynx is clear and moist and mucous membranes are normal. No oropharyngeal exudate, posterior oropharyngeal edema, posterior oropharyngeal erythema or tonsillar abscesses.  Eyes: Conjunctivae and EOM are normal. Pupils are equal, round, and reactive to light. Right eye exhibits no discharge. No scleral icterus.    Cardiovascular: Normal rate, regular rhythm, normal heart sounds and intact distal pulses.  Exam reveals no gallop and no friction rub.   No murmur heard. Pulmonary/Chest: Effort normal and breath sounds normal. No respiratory distress. She has no wheezes. She has no rales. She exhibits no tenderness.  Neurological: She is alert and oriented to person, place, and time.  Skin: Skin is warm and  dry. No rash noted. She is not diaphoretic. No erythema. No pallor.  Psychiatric: She has a normal mood and affect. Her behavior is normal. Judgment and thought content normal.  Nursing note and vitals reviewed.     Assessment & Plan:  1. Syncope, unspecified syncope type - Not sure of exact cause. Possibly related to hypotension or possible vasovagal. Doubt any intracranial mass but due to headache, will need to r/o.  - CT Head Wo Contrast; Future - Basic metabolic panel - CBC with Differential/Platelet - POCT Urinalysis Dipstick (Automated) - Follow up with PCP if no improvement  2. Encounter for PPD skin test reading - Read PPD- Negative   3. Subacute maxillary sinusitis - Does not appear to be bacterial.  - Basic metabolic panel - CBC with Differential/Platelet - Flonase and OTC allergy medication such as claritin  Shirline Frees, NP

## 2016-08-15 ENCOUNTER — Ambulatory Visit (INDEPENDENT_AMBULATORY_CARE_PROVIDER_SITE_OTHER)
Admission: RE | Admit: 2016-08-15 | Discharge: 2016-08-15 | Disposition: A | Discharge status: Home / Self Care | Payer: Self-pay | Source: Ambulatory Visit | Attending: Adult Health | Admitting: Adult Health

## 2016-08-15 DIAGNOSIS — R55 Syncope and collapse: Secondary | ICD-10-CM

## 2016-08-23 ENCOUNTER — Other Ambulatory Visit: Payer: Self-pay

## 2016-08-29 ENCOUNTER — Encounter: Payer: Self-pay | Admitting: Family Medicine

## 2016-09-05 ENCOUNTER — Other Ambulatory Visit: Payer: Self-pay | Admitting: Obstetrics and Gynecology

## 2016-09-06 LAB — CYTOLOGY - PAP

## 2016-10-30 NOTE — Progress Notes (Signed)
[name] [address] [place], [place] [address] Phone: [phone] Subjective:    I'm seeing this patient by the request  of:  FRY,STEPHEN A, MD   CC: foot pain   [id number]  [name] is a 55 y.o. female coming in with complaint of Foot pain. Patient's past medical history significant for no Morton's neuroma. We have seen patient greater than one year ago for patellofemoral syndrome. Patient states Having some foot pain. Seems to be bilateral. More towards the toes. Sometimes associated with numbness. Denies any swelling, does not remember any injury injury. Worse with certain activities. Patient rates the severity of pain a 6 out of 10. Patient is wondering what she can do. She feels like it is worsening.     Past Medical History:  Diagnosis Date  . Allergy    Dr Caprice Red  . Cardiac arrhythmia   . Depression   . Diabetes mellitus without complication ([place])   . Fibroids    uterine sees Dr. Carlota Raspberry in [place]   . Genital warts   . Heart murmur   . Hypertension   . Irritable bowel syndrome (IBS)   . Low back pain    has a herniated lumbar disc sees Dr Marlou Sa   Past Surgical History:  Procedure Laterality Date  . ABDOMINOPLASTY  04/2013  . COLONOSCOPY  02-10-15   per Dr. Hilarie Fredrickson, clear, repeat in 10 yrs   . LUMBAR LAMINECTOMY     L3-4 on 02-24-10 per Dr Louanne Skye   . MYOMECTOMY     laser ablation of uterine fibroids per Dr Ouida Sills 2000  . TUBAL LIGATION     Social History   Social History  . Marital status: Married    Spouse name: N/A  . Number of children: N/A  . Years of education: N/A   Occupational History  . N/A    Social History Main Topics  . Smoking status: Former Smoker    Packs/day: 0.50    Years: 1.00    Types: Cigarettes    Quit date: 12/17/1996  . Smokeless tobacco: Never Used  . Alcohol use No  . Drug use: No  . Sexual activity: Yes    Birth control/ protection: None   Other Topics Concern  . None     Social History Narrative  . None   Allergies  Allergen Reactions  . Hctz [Hydrochlorothiazide] Other (See Comments)    Blood pressure goes up  . Peanut-Containing Drug Products Anaphylaxis  . Ciprofloxacin Diarrhea  . Shellfish-Derived Products Other (See Comments)     Per allergy test   Family History  Problem Relation Age of Onset  . Arthritis      Family Hx  . Diabetes      Family Hx 1st degree relative  . Hyperlipidemia      Family Hx  . Hypertension      Family Hx  . Prostate cancer      Family Hx 1st Degree relative <50  . Stroke      First Degree Female <50  . Diabetes Mother   . Hypertension Mother   . Diabetes Father   . Hypertension Father   . Diabetes Maternal Grandmother   . Hypertension Maternal Grandmother   . Diabetes Maternal Grandfather   . Hypertension Maternal Grandfather   . Diabetes Paternal Grandmother   . Hypertension Paternal Grandmother   . Diabetes Paternal Grandfather   . Hypertension Paternal Grandfather   . Colon cancer  Neg Hx     Past medical history, social, surgical and family history all reviewed in electronic medical record.  No pertanent information unless stated regarding to the chief complaint.   Review of Systems:Review of systems updated and as accurate as of 10/31/16  No headache, visual changes, nausea, vomiting, diarrhea, constipation, dizziness, abdominal pain, skin rash, fevers, chills, night sweats, weight loss, swollen lymph nodes, body aches, joint swelling, muscle aches, chest pain, shortness of breath, mood changes.   Objective  Blood pressure 138/84, pulse 66, height 5' 3.25" (1.607 m), weight 180 lb (81.6 kg), SpO2 97 %. Systems examined below as of 10/31/16   General: No apparent distress alert and oriented x3 mood and affect normal, dressed appropriately.  HEENT: Pupils equal, extraocular movements intact  Respiratory: Patient's speak in full sentences and does not appear short of breath  Cardiovascular: No  lower extremity edema, non tender, no erythema  Skin: Warm dry intact with no signs of infection or rash on extremities or on axial skeleton.  Abdomen: Soft nontender  Neuro: Cranial nerves II through XII are intact, neurovascularly intact in all extremities with 2+ DTRs and 2+ pulses.  Lymph: No lymphadenopathy of posterior or anterior cervical chain or axillae bilaterally.  Gait normal with good balance and coordination.  MSK:  Non tender with full range of motion and good stability and symmetric strength and tone of shoulders, elbows, wrist, hip, knee and ankles bilaterally. Mild crepitus of the knees and mild tilt of the kneecaps bilaterally Foot exam shows- severe Jenean Lindau the transverse arch bilaterally, mild overpronation of the hindfoot. Patient does have tenderness to palpation in the midfoot + squeeze. Splaying between the first and second toes with mild fibular deviation on the right foot  Procedure note 97110; 15 minutes spent for Therapeutic exercises as stated in above notes.  This included exercises focusing on stretching, strengthening, with significant focus on eccentric aspects.   Proper technique shown and discussed handout in great detail with ATC.  All questions were discussed and answered.  Exercises for the foot include:  Stretches to help lengthen the lower leg and plantar fascia areas Theraband exercises for the lower leg and ankle to help strengthen the surrounding area- dorsiflexion, plantarflexion, inversion, eversion Massage rolling on the plantar surface of the foot with a frozen bottle, tennis ball or golf ball Towel or marble pick-ups to strengthen the plantar surface of the foot Weight bearing exercises to increase balance and overall stability      Impression and Recommendations:     This case required medical decision making of moderate complexity.      Note: This dictation was prepared with Dragon dictation along with smaller phrase technology. Any  transcriptional errors that result from this process are unintentional.

## 2016-10-31 ENCOUNTER — Ambulatory Visit (INDEPENDENT_AMBULATORY_CARE_PROVIDER_SITE_OTHER): Payer: Self-pay | Admitting: Family Medicine

## 2016-10-31 ENCOUNTER — Encounter: Payer: Self-pay | Admitting: Family Medicine

## 2016-10-31 DIAGNOSIS — M216X9 Other acquired deformities of unspecified foot: Secondary | ICD-10-CM | POA: Insufficient documentation

## 2016-10-31 MED ORDER — DICLOFENAC SODIUM 2 % TD SOLN: TRANSDERMAL | 3 refills | Status: DC

## 2016-10-31 NOTE — Assessment & Plan Note (Signed)
Encourage patient to try over-the-counter orthotics, home exercises, icing protocol, as well as proper shoes. We will see how patient does. Patient will come back in 4 weeks for further evaluation and treatment.

## 2016-10-31 NOTE — Patient Instructions (Addendum)
Good to see you  Ice 20 minutes 2 times daily. Usually after activity and before bed. Good shoes with rigid bottom.  Jalene Mullet, allegria, Merrell or New balance greater then 700 Spenco orthotics "total support" online would be great  Exercises 3 times a week.  Avoid walking barefoot.  Lace shoes without the middle eye.  pennsaid pinkie amount topically 2 times daily as needed.   Vitamin D 2000 IU dialy  Turmeric 500mg  daily  See me again in 4 weeks to see how we are doing.

## 2016-11-27 NOTE — Progress Notes (Deleted)
[name] [address] [place], [place] [address] Phone: [phone] Subjective:    I'm seeing this patient by the request  of:  FRY,STEPHEN A, MD   CC: foot pain f/u   [id number]  [name] is a 55 y.o. female coming in with complaint of Foot pain. Patient's past medical history significant for no Morton's neuroma.  Patient was found to have breakdown of the transverse arch as well as overpronation of the hindfoot. Patient was to start over-the-counter orthotics, take gabapentin regularly. We discussed icing regimen and home exercises. Patient states     Past Medical History:  Diagnosis Date  . Allergy    Dr Caprice Red  . Cardiac arrhythmia   . Depression   . Diabetes mellitus without complication ([place])   . Fibroids    uterine sees Dr. Carlota Raspberry in [place]   . Genital warts   . Heart murmur   . Hypertension   . Irritable bowel syndrome (IBS)   . Low back pain    has a herniated lumbar disc sees Dr Marlou Sa   Past Surgical History:  Procedure Laterality Date  . ABDOMINOPLASTY  04/2013  . COLONOSCOPY  02-10-15   per Dr. Hilarie Fredrickson, clear, repeat in 10 yrs   . LUMBAR LAMINECTOMY     L3-4 on 02-24-10 per Dr Louanne Skye   . MYOMECTOMY     laser ablation of uterine fibroids per Dr Ouida Sills 2000  . TUBAL LIGATION     Social History   Social History  . Marital status: Married    Spouse name: N/A  . Number of children: N/A  . Years of education: N/A   Occupational History  . N/A    Social History Main Topics  . Smoking status: Former Smoker    Packs/day: 0.50    Years: 1.00    Types: Cigarettes    Quit date: 12/17/1996  . Smokeless tobacco: Never Used  . Alcohol use No  . Drug use: No  . Sexual activity: Yes    Birth control/ protection: None   Other Topics Concern  . Not on file   Social History Narrative  . No narrative on file   Allergies  Allergen Reactions  . Hctz [Hydrochlorothiazide] Other (See Comments)    Blood  pressure goes up  . Peanut-Containing Drug Products Anaphylaxis  . Ciprofloxacin Diarrhea  . Shellfish-Derived Products Other (See Comments)     Per allergy test   Family History  Problem Relation Age of Onset  . Arthritis      Family Hx  . Diabetes      Family Hx 1st degree relative  . Hyperlipidemia      Family Hx  . Hypertension      Family Hx  . Prostate cancer      Family Hx 1st Degree relative <50  . Stroke      First Degree Female <50  . Diabetes Mother   . Hypertension Mother   . Diabetes Father   . Hypertension Father   . Diabetes Maternal Grandmother   . Hypertension Maternal Grandmother   . Diabetes Maternal Grandfather   . Hypertension Maternal Grandfather   . Diabetes Paternal Grandmother   . Hypertension Paternal Grandmother   . Diabetes Paternal Grandfather   . Hypertension Paternal Grandfather   . Colon cancer Neg Hx     Past medical history, social, surgical and family history all reviewed in electronic medical record.  No pertanent information unless stated  regarding to the chief complaint.   Review of Systems: No headache, visual changes, nausea, vomiting, diarrhea, constipation, dizziness, abdominal pain, skin rash, fevers, chills, night sweats, weight loss, swollen lymph nodes, body aches, joint swelling, muscle aches, chest pain, shortness of breath, mood changes.    Objective  There were no vitals taken for this visit. Systems examined below as of 11/27/16   General: No apparent distress alert and oriented x3 mood and affect normal, dressed appropriately.  HEENT: Pupils equal, extraocular movements intact  Respiratory: Patient's speak in full sentences and does not appear short of breath  Cardiovascular: No lower extremity edema, non tender, no erythema  Skin: Warm dry intact with no signs of infection or rash on extremities or on axial skeleton.  Abdomen: Soft nontender  Neuro: Cranial nerves II through XII are intact, neurovascularly intact in  all extremities with 2+ DTRs and 2+ pulses.  Lymph: No lymphadenopathy of posterior or anterior cervical chain or axillae bilaterally.  Gait normal with good balance and coordination.  MSK:  Non tender with full range of motion and good stability and symmetric strength and tone of shoulders, elbows, wrist, hip, knee and ankles bilaterally. Mild crepitus of the knees and mild tilt of the kneecaps bilaterally Foot exam shows- severe Jenean Lindau the transverse arch bilaterally, mild overpronation of the hindfoot. Patient does have tenderness to palpation in the midfoot + squeeze. Splaying between the first and second toes with mild fibular deviation on the right foot    Impression and Recommendations:     This case required medical decision making of moderate complexity.      Note: This dictation was prepared with Dragon dictation along with smaller phrase technology. Any transcriptional errors that result from this process are unintentional.

## 2016-11-28 ENCOUNTER — Ambulatory Visit: Payer: Self-pay | Admitting: Family Medicine

## 2016-11-28 DIAGNOSIS — Z0289 Encounter for other administrative examinations: Secondary | ICD-10-CM

## 2016-12-31 ENCOUNTER — Encounter: Payer: Self-pay | Admitting: Family Medicine

## 2016-12-31 ENCOUNTER — Ambulatory Visit (INDEPENDENT_AMBULATORY_CARE_PROVIDER_SITE_OTHER): Payer: 59 | Admitting: Family Medicine

## 2016-12-31 VITALS — BP 141/94 | HR 68 | Temp 97.9°F | Ht 63.25 in | Wt 177.0 lb

## 2016-12-31 DIAGNOSIS — I10 Essential (primary) hypertension: Secondary | ICD-10-CM

## 2016-12-31 DIAGNOSIS — E0849 Diabetes mellitus due to underlying condition with other diabetic neurological complication: Secondary | ICD-10-CM

## 2016-12-31 DIAGNOSIS — E118 Type 2 diabetes mellitus with unspecified complications: Secondary | ICD-10-CM | POA: Diagnosis not present

## 2016-12-31 LAB — BASIC METABOLIC PANEL
BUN: 15 mg/dL (ref 6–23)
CHLORIDE: 103 meq/L (ref 96–112)
CO2: 27 mEq/L (ref 19–32)
CREATININE: 0.83 mg/dL (ref 0.40–1.20)
Calcium: 10 mg/dL (ref 8.4–10.5)
GFR: 92.16 mL/min (ref >60.00)
Glucose, Bld: 115 mg/dL — ABNORMAL HIGH (ref 70–99)
POTASSIUM: 4 meq/L (ref 3.5–5.1)
Sodium: 137 mEq/L (ref 135–145)

## 2016-12-31 LAB — HEMOGLOBIN A1C: HEMOGLOBIN A1C: 6.5 % (ref 4.6–6.5)

## 2016-12-31 MED ORDER — GABAPENTIN 300 MG PO CAPS: 300.0000 mg | ORAL_CAPSULE | Freq: Three times a day (TID) | ORAL | 5 refills | Status: DC

## 2016-12-31 MED ORDER — LISINOPRIL 20 MG PO TABS: 20.0000 mg | ORAL_TABLET | Freq: Two times a day (BID) | ORAL | 3 refills | Status: DC

## 2016-12-31 NOTE — Progress Notes (Signed)
Pre visit review using our clinic review tool, if applicable. No additional management support is needed unless otherwise documented below in the visit note. 

## 2016-12-31 NOTE — Progress Notes (Signed)
Subjective:    Patient ID: [name], female    DOB: 07-04-1961, 56 y.o.   MRN: [id number]  HPI Here for several issues. First she asks about night time rises in her blood glucose. During the day she is stable and each night at bedtime she is around 90 to 100. However she often gets 160-170 fasting the next morning. She feels well most of the time. Also she tells me she stopped taking Lasix a month ago due to leg cramps and that now the cramps have stopped. No swelling in the legs or feet. Her BP has been borderline high at home. She continues to watch the diet and exercise. Also she has had more trouble with tingling in and burning in the feet. She is currently taking Gabapentin 100 mg tid.    Review of Systems  Constitutional: Negative.   Respiratory: Negative.   Cardiovascular: Negative.   Gastrointestinal: Negative.   Neurological: Positive for numbness.       Objective:   Physical Exam  Constitutional: She is oriented to person, place, and time. She appears well-developed and well-nourished.  Cardiovascular: Normal rate, regular rhythm, normal heart sounds and intact distal pulses.   Pulmonary/Chest: Effort normal and breath sounds normal.  Musculoskeletal: She exhibits no edema.  Neurological: She is alert and oriented to person, place, and time.          Assessment & Plan:  Her diabetes seems to be stable but she is exhibiting  typical early morning glucose rises due to the release of of arousal hormones. We elected to stay off meds for now but she will get an A1c today to assess her overall level of glucose control. Increase Lsinopril to 20 mg bid. Increase Gabapentin to 300 mg tid. Stay off Lasix. Check a BMET.  Gershon Crane, MD

## 2017-01-04 ENCOUNTER — Telehealth: Payer: Self-pay | Admitting: Family Medicine

## 2017-01-04 MED ORDER — LISINOPRIL 20 MG PO TABS: 20.0000 mg | ORAL_TABLET | Freq: Two times a day (BID) | ORAL | 3 refills | Status: DC

## 2017-01-04 NOTE — Telephone Encounter (Signed)
done

## 2017-01-04 NOTE — Telephone Encounter (Signed)
I will need to send in new script with dosage change.

## 2017-01-04 NOTE — Telephone Encounter (Signed)
° ° °  Pt call to say that the pharmacy did not received a rx for the below med. Pt also said Dr Sarajane Jews was changing her mg to 40 mg  Would like clarification    lisinopril (PRINIVIL,ZESTRIL) 20 MG tablet

## 2017-02-11 ENCOUNTER — Telehealth: Payer: Self-pay | Admitting: Family Medicine

## 2017-02-11 NOTE — Telephone Encounter (Signed)
Pt state that she would like to have her Rx lisinopril reflect that she takes 2 pills a day at her pharmacy this was changed the last time that she was in the office to see Dr. Sarajane Jews and she is out of the medication.   Pharm:  [facility].  Pt would like to have a call when this Rx is called in to the pharmacy.

## 2017-02-12 MED ORDER — LISINOPRIL 20 MG PO TABS: 20.0000 mg | ORAL_TABLET | Freq: Two times a day (BID) | ORAL | 3 refills | Status: DC

## 2017-02-12 NOTE — Telephone Encounter (Signed)
I resent script e-scribe to [facility] and spoke with pt.

## 2017-02-21 ENCOUNTER — Telehealth: Payer: Self-pay | Admitting: Family Medicine

## 2017-02-21 NOTE — Telephone Encounter (Signed)
Pt calling to see why her blood sugars are dropping everyday and does not come back up quickly after eating.  Refuse appointment and wanted to speak with a nurse.

## 2017-02-21 NOTE — Telephone Encounter (Signed)
Patient Name: [name]  DOB: 1961/05/01    Initial Comment Caller states her blood sugar is dropping to the 80s for the last few weeks.    Nurse Assessment  Nurse: Christel Mormon, RN, Levada Dy Date/Time (Eastern Time): 02/21/2017 4:26:45 PM  Confirm and document reason for call. If symptomatic, describe symptoms. ---Caller states the last couple of weeks her BS has been dropping down into the 80's. She states she eats every 2 hrs. When she gets up in the morning, her BS has been 130-140's and takes til 49 when it gets to 100. She is not on any BS medications at all.  Does the patient have any new or worsening symptoms? ---Yes  Will a triage be completed? ---Yes  Related visit to physician within the last 2 weeks? ---No  Does the PT have any chronic conditions? (i.e. diabetes, asthma, etc.) ---Yes  List chronic conditions. ---hypoglycemia  Is the patient pregnant or possibly pregnant? (Ask all females between the ages of 74-55) ---No  Is this a behavioral health or substance abuse call? ---No     Guidelines    Guideline Title Affirmed Question Affirmed Notes  Diabetes - Low Blood Sugar [1] Evening (after bedtime snack) blood glucose < 100 mg/dl (5.6 mmol/l) AND [2] more than once in past week    Final Disposition User   Call PCP within 24 Hours [place], RN, Photographer    Referrals  REFERRED TO PCP OFFICE   Disagree/Comply: Leta Baptist

## 2017-02-22 ENCOUNTER — Ambulatory Visit (INDEPENDENT_AMBULATORY_CARE_PROVIDER_SITE_OTHER): Payer: 59 | Admitting: Family Medicine

## 2017-02-22 ENCOUNTER — Encounter: Payer: Self-pay | Admitting: Family Medicine

## 2017-02-22 VITALS — BP 138/88 | Temp 97.9°F | Ht 63.25 in | Wt 177.0 lb

## 2017-02-22 DIAGNOSIS — E119 Type 2 diabetes mellitus without complications: Secondary | ICD-10-CM

## 2017-02-22 DIAGNOSIS — I10 Essential (primary) hypertension: Secondary | ICD-10-CM

## 2017-02-22 LAB — CBC WITH DIFFERENTIAL/PLATELET
BASOS ABS: 0 10*3/uL (ref 0.0–0.1)
BASOS PCT: 0.4 % (ref 0.0–3.0)
Eosinophils Absolute: 0.2 10*3/uL (ref 0.0–0.7)
Eosinophils Relative: 2.2 % (ref 0.0–5.0)
HCT: 43.9 % (ref 36.0–46.0)
Hemoglobin: 14.5 g/dL (ref 12.0–15.0)
LYMPHS ABS: 3.2 10*3/uL (ref 0.7–4.0)
Lymphocytes Relative: 36.6 % (ref 12.0–46.0)
MCHC: 33.2 g/dL (ref 30.0–36.0)
MCV: 88.5 fl (ref 78.0–100.0)
MONOS PCT: 6.1 % (ref 3.0–12.0)
Monocytes Absolute: 0.5 10*3/uL (ref 0.1–1.0)
NEUTROS ABS: 4.8 10*3/uL (ref 1.4–7.7)
NEUTROS PCT: 54.7 % (ref 43.0–77.0)
PLATELETS: 375 10*3/uL (ref 150.0–400.0)
RBC: 4.96 Mil/uL (ref 3.87–5.11)
RDW: 14.6 % (ref 11.5–15.5)
WBC: 8.7 10*3/uL (ref 4.0–10.5)

## 2017-02-22 LAB — POC URINALSYSI DIPSTICK (AUTOMATED)
BILIRUBIN UA: NEGATIVE
Glucose, UA: NEGATIVE
KETONES UA: NEGATIVE
LEUKOCYTES UA: NEGATIVE
Nitrite, UA: NEGATIVE
PH UA: 5.5
Protein, UA: NEGATIVE
Spec Grav, UA: 1.025
Urobilinogen, UA: 0.2

## 2017-02-22 LAB — TSH: TSH: 1 u[IU]/mL (ref 0.35–4.50)

## 2017-02-22 LAB — HEPATIC FUNCTION PANEL
ALBUMIN: 4.9 g/dL (ref 3.5–5.2)
ALT: 21 U/L (ref 0–35)
AST: 14 U/L (ref 0–37)
Alkaline Phosphatase: 175 U/L — ABNORMAL HIGH (ref 39–117)
BILIRUBIN TOTAL: 0.7 mg/dL (ref 0.2–1.2)
Bilirubin, Direct: 0.1 mg/dL (ref 0.0–0.3)
Total Protein: 7.3 g/dL (ref 6.0–8.3)

## 2017-02-22 NOTE — Telephone Encounter (Signed)
Pt scheduled to see Dr Sarajane Jews this morning.

## 2017-02-22 NOTE — Patient Instructions (Signed)
WE NOW OFFER   [facility] FAST TRACK!!!  SAME DAY Appointments for ACUTE CARE  Such as: Sprains, Injuries, cuts, abrasions, rashes, muscle pain, joint pain, back pain Colds, flu, sore throats, headache, allergies, cough, fever  Ear pain, sinus and eye infections Abdominal pain, nausea, vomiting, diarrhea, upset stomach Animal/insect bites  3 Easy Ways to Schedule: Walk-In Scheduling Call in scheduling Mychart Sign-up: [email or web address]         

## 2017-02-22 NOTE — Progress Notes (Signed)
Subjective:    Patient ID: [name], female    DOB: 1961-05-02, 56 y.o.   MRN: [id number]  HPI Here to follow up. She has been having a lot of drops in her blood glucoses. She is off all diabetic meds. She tries to eat frequent snacks but still has drops to the 60s or so. Also her BP has been creeping up a little. She still exercises daily.    Review of Systems  Constitutional: Negative.   Respiratory: Negative.   Cardiovascular: Negative.   Endocrine: Negative.   Neurological: Negative.        Objective:   Physical Exam  Constitutional: She is oriented to person, place, and time. She appears well-developed and well-nourished.  Cardiovascular: Normal rate, regular rhythm, normal heart sounds and intact distal pulses.   Pulmonary/Chest: Effort normal and breath sounds normal.  Neurological: She is alert and oriented to person, place, and time.          Assessment & Plan:  For the hypoglycemic episodes we will have her meet with Nutrition again to make dietary changes. Get fasting labs today (her A1c in January was 6.5). For the BP she will increase the Amlodipine to taking a full tablet daily.  Gershon Crane, MD

## 2017-02-22 NOTE — Telephone Encounter (Signed)
Pt is here for appointment now.

## 2017-02-22 NOTE — Progress Notes (Signed)
Pre visit review using our clinic review tool, if applicable. No additional management support is needed unless otherwise documented below in the visit note. 

## 2017-02-28 NOTE — Telephone Encounter (Signed)
She is correct, I forgot to order the lipid panel. Today I did order one so she can set up a lab appt when convenient

## 2017-03-01 NOTE — Telephone Encounter (Signed)
This has been done and a message was sent to pt.

## 2017-03-16 ENCOUNTER — Other Ambulatory Visit: Payer: Self-pay | Admitting: Family Medicine

## 2017-03-17 ENCOUNTER — Encounter: Payer: Self-pay | Admitting: Family Medicine

## 2017-03-20 ENCOUNTER — Other Ambulatory Visit: Payer: Self-pay | Admitting: Family Medicine

## 2017-03-20 ENCOUNTER — Encounter: Payer: Self-pay | Admitting: Family Medicine

## 2017-03-20 ENCOUNTER — Encounter: Payer: 59 | Attending: Family Medicine | Admitting: Registered"

## 2017-03-20 DIAGNOSIS — E119 Type 2 diabetes mellitus without complications: Secondary | ICD-10-CM | POA: Insufficient documentation

## 2017-03-20 DIAGNOSIS — Z713 Dietary counseling and surveillance: Secondary | ICD-10-CM | POA: Insufficient documentation

## 2017-03-20 DIAGNOSIS — E162 Hypoglycemia, unspecified: Secondary | ICD-10-CM

## 2017-03-20 MED ORDER — AMLODIPINE BESYLATE 10 MG PO TABS: 10.0000 mg | ORAL_TABLET | Freq: Every day | ORAL | 1 refills | Status: DC

## 2017-03-20 NOTE — Progress Notes (Signed)
Medical Nutrition Therapy:  Appt start time: 0810 end time:  0910.  Pt was previously seen 05/2015 by Antonieta Iba for diabetes education.  Assessment:  Primary concerns today: Low blood sugar. Pt reports that she eats every 2 hrs was controlling but for the last month between 1:30-2:00 every day starts dropping to 80s by 5 pm gets down to 70. Pt uses appropriate amount of carbs to treat, but does not recheck her blood sugar. Pt states she started 2 weeks ago eating more substantial snacks.  Sleep: goes to bed at 8 pm. Feels like she gets enough sleep.  Physical Activity: Pt reports after 6 pm dinner she goes to gym. Works out 1 hr, Teacher, adult education, teaches hoola hoop class.  Preferred Learning Style:   No preference indicated   Learning Readiness:   Ready  MEDICATIONS: reviewed  Supplements: cinnamon and garcinia cambogia (weight management supplement)   DIETARY INTAKE:  Usual eating pattern includes 3 meals and 3 snacks per day.  Everyday foods include berries.  Avoided foods include caffeine.  Fried foods, grapes because shoots sugar up to 160.  24-hr recall:  B ( AM): egg white, oatmeal OR egg white, bacon, blueberries  Snk ( AM): 1/2 wheat tortilla, almond or peanut butter L ( PM): 1/2 collard greens, fish or chicken, sweet potato, water, berries Snk ( PM): PB crackers, blueberries D ( PM): same as lunch Snk ( PM): cravings in evening- sweets OR unsweetened jello Beverages: water  Usual physical activity: 1 hr moderate to strenuous 5x week  Estimated energy needs: 1800 calories 200 g carbohydrates 135 g protein 50 g fat  Progress Towards Goal(s):  In progress.   Nutritional Diagnosis:  NI-1.2 Inadequate energy intake related to increased frequency intake needs as evidenced by pt reported daily hypoglycemic events.    Intervention:  Nutrition education. Activity lowers BG. Role of stress and sleep in bg control. Rule of 15. Weight management. Consider discontinuing weight  loss supplement which claims to reduce appetite.  Plan:  When notice low blood sugar, continue to treat with fast acting sugar and recheck in 15 minutes. If still low, have another 15 grams. When sugar is getting to normal levels have a meal or snack with protein.  Continue having substantial snacks  Check blood sugar after exercise  Www.diabetes.org great website for resources  Stess management may help manage blood sugar. Explore some tools for managing stress  For better sleep avoid screen for 1 hr before bed  Ideas for constipation, a few prunes during the day, Calm magnesium drink in the evening (Pt states she will check with doctor since she is taking mag Rx)  Teaching Method Utilized:  Visual Auditory  Handouts given during visit include:  none   Barriers to learning/adherence to lifestyle change: none  Demonstrated degree of understanding via:  Teach Back   Monitoring/Evaluation:  Dietary intake, exercise, bowel habits, sleep quality and body weight prn.

## 2017-03-20 NOTE — Patient Instructions (Addendum)
Plan:  When notice low blood sugar, continue to treat with fast acting sugar and recheck in 15 minutes. If still low, have another 15 grams. When sugar is getting to normal levels have a meal or snack with protein.  Continue having substantial snacks  Check blood sugar after exercise  Www.diabetes.org great website for resources  Stess management may help manage blood sugar. Explore some tools for managing stress  For better sleep avoid screen for 1 hr before bed  Ideas for constipation, a few prunes during the day, Calm magnesium drink in the evening

## 2017-03-21 ENCOUNTER — Other Ambulatory Visit: Payer: 59

## 2017-03-22 ENCOUNTER — Other Ambulatory Visit (INDEPENDENT_AMBULATORY_CARE_PROVIDER_SITE_OTHER): Payer: 59

## 2017-03-22 DIAGNOSIS — E119 Type 2 diabetes mellitus without complications: Secondary | ICD-10-CM

## 2017-03-22 LAB — LIPID PANEL
CHOLESTEROL: 184 mg/dL (ref 0–200)
HDL: 40 mg/dL (ref >39.00)
LDL Cholesterol: 107 mg/dL — ABNORMAL HIGH (ref 0–99)
NONHDL: 144.18
Total CHOL/HDL Ratio: 5
Triglycerides: 184 mg/dL — ABNORMAL HIGH (ref 0.0–149.0)
VLDL: 36.8 mg/dL (ref 0.0–40.0)

## 2017-04-04 ENCOUNTER — Ambulatory Visit (INDEPENDENT_AMBULATORY_CARE_PROVIDER_SITE_OTHER): Payer: 59 | Admitting: Family Medicine

## 2017-04-04 ENCOUNTER — Encounter: Payer: Self-pay | Admitting: Family Medicine

## 2017-04-04 VITALS — BP 102/80 | HR 88 | Temp 98.4°F | Ht 63.25 in

## 2017-04-04 DIAGNOSIS — J302 Other seasonal allergic rhinitis: Secondary | ICD-10-CM

## 2017-04-04 DIAGNOSIS — J329 Chronic sinusitis, unspecified: Secondary | ICD-10-CM | POA: Diagnosis not present

## 2017-04-04 DIAGNOSIS — J4530 Mild persistent asthma, uncomplicated: Secondary | ICD-10-CM | POA: Diagnosis not present

## 2017-04-04 DIAGNOSIS — J310 Chronic rhinitis: Secondary | ICD-10-CM

## 2017-04-04 MED ORDER — BENZONATATE 100 MG PO CAPS: 100.0000 mg | ORAL_CAPSULE | Freq: Two times a day (BID) | ORAL | 0 refills | Status: DC | PRN

## 2017-04-04 NOTE — Progress Notes (Signed)
Pre visit review using our clinic review tool, if applicable. No additional management support is needed unless otherwise documented below in the visit note. Patient declined weight measurement today.

## 2017-04-04 NOTE — Patient Instructions (Signed)
Please take your zyrtec and qvar daily.  Use your albuterol as needed per instructions.  Can use the tessalon if needed for cough.  I hope you are feeling better soon! Seek care immediately if worsening, new concerns or you are not improving with treatment.

## 2017-04-04 NOTE — Progress Notes (Signed)
HPI:  Acute visit for upper resp symptoms: -started:yesterday -symptoms:nasal congestion, sore throat, cough, sneezing, green congestion x 1 -denies:fever, SOB, NVD, tooth pain,wheezing -has tried: nothing -sick contacts/travel/risks: no reported flu, strep or tick exposure -Hx of: allergies and asthma - has not been taking any of her medications ROS: See pertinent positives and negatives per HPI.  Past Medical History:  Diagnosis Date  . Allergy    Dr Caprice Red  . Cardiac arrhythmia   . Depression   . Diabetes mellitus without complication ([place])   . Fibroids    uterine sees Dr. Carlota Raspberry in [place]   . Genital warts   . Heart murmur   . Hypertension   . Irritable bowel syndrome (IBS)   . Low back pain    has a herniated lumbar disc sees Dr Marlou Sa    Past Surgical History:  Procedure Laterality Date  . ABDOMINOPLASTY  04/2013  . COLONOSCOPY  02-10-15   per Dr. Hilarie Fredrickson, clear, repeat in 10 yrs   . LUMBAR LAMINECTOMY     L3-4 on 02-24-10 per Dr Louanne Skye   . MYOMECTOMY     laser ablation of uterine fibroids per Dr Ouida Sills 2000  . TUBAL LIGATION      Family History  Problem Relation Age of Onset  . Arthritis      Family Hx  . Diabetes      Family Hx 1st degree relative  . Hyperlipidemia      Family Hx  . Hypertension      Family Hx  . Prostate cancer      Family Hx 1st Degree relative <50  . Stroke      First Degree Female <50  . Diabetes Mother   . Hypertension Mother   . Diabetes Father   . Hypertension Father   . Diabetes Maternal Grandmother   . Hypertension Maternal Grandmother   . Diabetes Maternal Grandfather   . Hypertension Maternal Grandfather   . Diabetes Paternal Grandmother   . Hypertension Paternal Grandmother   . Diabetes Paternal Grandfather   . Hypertension Paternal Grandfather   . Colon cancer Neg Hx     Social History   Social History  . Marital status: Married    Spouse name: N/A  . Number of children: N/A  . Years of education:  N/A   Occupational History  . N/A    Social History Main Topics  . Smoking status: Former Smoker    Packs/day: 0.50    Years: 1.00    Types: Cigarettes    Quit date: 12/17/1996  . Smokeless tobacco: Never Used  . Alcohol use No  . Drug use: No  . Sexual activity: Yes    Birth control/ protection: None   Other Topics Concern  . None   Social History Narrative  . None     Current Outpatient Prescriptions:  .  albuterol (PROVENTIL HFA;VENTOLIN HFA) 108 (90 Base) MCG/ACT inhaler, Inhale 2 puffs into the lungs every 4 (four) hours as needed for wheezing or shortness of breath., Disp: 1 Inhaler, Rfl: 0 .  amLODipine (NORVASC) 10 MG tablet, Take 1 tablet (10 mg total) by mouth daily., Disp: 90 tablet, Rfl: 1 .  atorvastatin (LIPITOR) 20 MG tablet, Take 1 tablet (20 mg total) by mouth daily., Disp: 90 tablet, Rfl: 3 .  beclomethasone (QVAR) 80 MCG/ACT inhaler, Inhale 2 puffs into the lungs daily., Disp: 1 Inhaler, Rfl: 12 .  cholecalciferol (VITAMIN D) 1000 UNITS tablet, Take 1,000 Units by mouth  daily. , Disp: , Rfl:  .  gabapentin (NEURONTIN) 300 MG capsule, Take 1 capsule (300 mg total) by mouth 3 (three) times daily., Disp: 90 capsule, Rfl: 5 .  lisinopril (PRINIVIL,ZESTRIL) 20 MG tablet, Take 1 tablet (20 mg total) by mouth 2 (two) times daily., Disp: 180 tablet, Rfl: 3 .  MAGNESIUM PO, Take 500 mg by mouth daily., Disp: , Rfl:  .  Multiple Vitamins-Minerals (MULTIVITAMIN WITH MINERALS) tablet, Take 1 tablet by mouth daily., Disp: , Rfl:  .  omeprazole (PRILOSEC) 40 MG capsule, Take 1 capsule (40 mg total) by mouth daily., Disp: 90 capsule, Rfl: 3 .  potassium chloride SA (K-DUR,KLOR-CON) 20 MEQ tablet, TAKE ONE TABLET BY MOUTH DAILY, Disp: 90 tablet, Rfl: 1 .  PRODIGY NO CODING BLOOD GLUC test strip, USE TO TEST BLOOD SUGAR ONCE DAILY, Disp: 100 each, Rfl: 2 .  benzonatate (TESSALON PERLES) 100 MG capsule, Take 1 capsule (100 mg total) by mouth 2 (two) times daily as needed for  cough., Disp: 20 capsule, Rfl: 0  EXAM:  Vitals:   04/04/17 1703  BP: 102/80  Pulse: 88  Temp: 98.4 F (36.9 C)    There is no height or weight on file to calculate BMI.  GENERAL: vitals reviewed and listed above, alert, oriented, appears well hydrated and in no acute distress  HEENT: atraumatic, conjunttiva clear, no obvious abnormalities on inspection of external nose and ears, normal appearance of ear canals and TMs, clear nasal congestion, mild post oropharyngeal erythema with PND, no tonsillar edema or exudate, no sinus TTP  NECK: no obvious masses on inspection  LUNGS: clear to auscultation bilaterally, no wheezes, rales or rhonchi, good air movement  CV: HRRR, no peripheral edema  MS: moves all extremities without noticeable abnormality  PSYCH: pleasant and cooperative, no obvious depression or anxiety  ASSESSMENT AND PLAN:  Discussed the following assessment and plan:  Rhinosinusitis  Seasonal allergic rhinitis, unspecified trigger  Mild persistent asthma, unspecified whether complicated  -given HPI and exam findings today, a serious infection or illness is unlikely. We discussed potential etiologies, with VURI or seasonal allergies being most likely. Advised getting on her regular allergy and asthma medications, tessalon for cough and return precuations. We discussed treatment side effects, likely course, antibiotic misuse, transmission, and signs of developing a serious illness. -of course, we advised to return or notify a doctor immediately if symptoms worsen or persist or new concerns arise.    Patient Instructions  Please take your zyrtec and qvar daily.  Use your albuterol as needed per instructions.  Can use the tessalon if needed for cough.  I hope you are feeling better soon! Seek care immediately if worsening, new concerns or you are not improving with treatment.     Colin Benton R., DO

## 2017-05-08 ENCOUNTER — Ambulatory Visit: Payer: 59 | Admitting: Podiatry

## 2017-05-20 ENCOUNTER — Ambulatory Visit (INDEPENDENT_AMBULATORY_CARE_PROVIDER_SITE_OTHER): Payer: 59 | Admitting: Family Medicine

## 2017-05-20 ENCOUNTER — Encounter: Payer: Self-pay | Admitting: Family Medicine

## 2017-05-20 ENCOUNTER — Telehealth: Payer: Self-pay

## 2017-05-20 VITALS — BP 102/80 | HR 80 | Temp 98.2°F | Ht 63.25 in | Wt 184.3 lb

## 2017-05-20 DIAGNOSIS — G2581 Restless legs syndrome: Secondary | ICD-10-CM | POA: Diagnosis not present

## 2017-05-20 DIAGNOSIS — M25473 Effusion, unspecified ankle: Secondary | ICD-10-CM | POA: Diagnosis not present

## 2017-05-20 DIAGNOSIS — M79605 Pain in left leg: Secondary | ICD-10-CM

## 2017-05-20 DIAGNOSIS — M79604 Pain in right leg: Secondary | ICD-10-CM

## 2017-05-20 NOTE — Telephone Encounter (Signed)
Pt called to report that her legs have been swelling and this past weekend she felt like there were "bricks" tied to her feet. She denies any localized pain/redness/edema. She reports the problem has been increasing for several weeks. She does not want to come in and be seen, but is concerned about potential blood clot. Tried to explain to pt that she would need a physical exam to assess. She would like to know what you would recommend.  Dr. Sarajane Jews - Please advise. Thanks!

## 2017-05-20 NOTE — Telephone Encounter (Signed)
She needs an OV because this could be due to any number of things

## 2017-05-20 NOTE — Progress Notes (Signed)
HPI:  Acute visit for:  Leg pains: -"for years" -symptoms: deep pains in legs at night, "restless legs" at night, deep discomfort and she feels she needs to move them, some swelling in ankles with poor diet and certain activities, sometimes cramps in legs - mainly at night -no weakness, numbness, fevers, malaise, SOB -she has hx neuropathy in lecs and chronic L > R LE edema  ROS: See pertinent positives and negatives per HPI.  Past Medical History:  Diagnosis Date  . Allergy    Dr Caprice Red  . Cardiac arrhythmia   . Depression   . Diabetes mellitus without complication ([place])   . Fibroids    uterine sees Dr. Carlota Raspberry in [place]   . Genital warts   . Heart murmur   . Hypertension   . Irritable bowel syndrome (IBS)   . Low back pain    has a herniated lumbar disc sees Dr Marlou Sa    Past Surgical History:  Procedure Laterality Date  . ABDOMINOPLASTY  04/2013  . COLONOSCOPY  02-10-15   per Dr. Hilarie Fredrickson, clear, repeat in 10 yrs   . LUMBAR LAMINECTOMY     L3-4 on 02-24-10 per Dr Louanne Skye   . MYOMECTOMY     laser ablation of uterine fibroids per Dr Ouida Sills 2000  . TUBAL LIGATION      Family History  Problem Relation Age of Onset  . Arthritis Unknown        Family Hx  . Diabetes Unknown        Family Hx 1st degree relative  . Hyperlipidemia Unknown        Family Hx  . Hypertension Unknown        Family Hx  . Prostate cancer Unknown        Family Hx 1st Degree relative <50  . Stroke Unknown        First Degree Female <50  . Diabetes Mother   . Hypertension Mother   . Diabetes Father   . Hypertension Father   . Diabetes Maternal Grandmother   . Hypertension Maternal Grandmother   . Diabetes Maternal Grandfather   . Hypertension Maternal Grandfather   . Diabetes Paternal Grandmother   . Hypertension Paternal Grandmother   . Diabetes Paternal Grandfather   . Hypertension Paternal Grandfather   . Colon cancer Neg Hx     Social History   Social History  . Marital  status: Married    Spouse name: N/A  . Number of children: N/A  . Years of education: N/A   Occupational History  . N/A    Social History Main Topics  . Smoking status: Former Smoker    Packs/day: 0.50    Years: 1.00    Types: Cigarettes    Quit date: 12/17/1996  . Smokeless tobacco: Never Used  . Alcohol use No  . Drug use: No  . Sexual activity: Yes    Birth control/ protection: None   Other Topics Concern  . None   Social History Narrative  . None     Current Outpatient Prescriptions:  .  albuterol (PROVENTIL HFA;VENTOLIN HFA) 108 (90 Base) MCG/ACT inhaler, Inhale 2 puffs into the lungs every 4 (four) hours as needed for wheezing or shortness of breath., Disp: 1 Inhaler, Rfl: 0 .  amLODipine (NORVASC) 10 MG tablet, Take 1 tablet (10 mg total) by mouth daily., Disp: 90 tablet, Rfl: 1 .  atorvastatin (LIPITOR) 20 MG tablet, Take 1 tablet (20 mg total) by mouth daily., Disp:  90 tablet, Rfl: 3 .  beclomethasone (QVAR) 80 MCG/ACT inhaler, Inhale 2 puffs into the lungs daily., Disp: 1 Inhaler, Rfl: 12 .  benzonatate (TESSALON PERLES) 100 MG capsule, Take 1 capsule (100 mg total) by mouth 2 (two) times daily as needed for cough., Disp: 20 capsule, Rfl: 0 .  cholecalciferol (VITAMIN D) 1000 UNITS tablet, Take 1,000 Units by mouth daily. , Disp: , Rfl:  .  gabapentin (NEURONTIN) 300 MG capsule, Take 1 capsule (300 mg total) by mouth 3 (three) times daily., Disp: 90 capsule, Rfl: 5 .  lisinopril (PRINIVIL,ZESTRIL) 20 MG tablet, Take 1 tablet (20 mg total) by mouth 2 (two) times daily., Disp: 180 tablet, Rfl: 3 .  MAGNESIUM PO, Take 500 mg by mouth daily., Disp: , Rfl:  .  Multiple Vitamins-Minerals (MULTIVITAMIN WITH MINERALS) tablet, Take 1 tablet by mouth daily., Disp: , Rfl:  .  omeprazole (PRILOSEC) 40 MG capsule, Take 1 capsule (40 mg total) by mouth daily., Disp: 90 capsule, Rfl: 3 .  potassium chloride SA (K-DUR,KLOR-CON) 20 MEQ tablet, TAKE ONE TABLET BY MOUTH DAILY, Disp: 90  tablet, Rfl: 1 .  PRODIGY NO CODING BLOOD GLUC test strip, USE TO TEST BLOOD SUGAR ONCE DAILY, Disp: 100 each, Rfl: 2  EXAM:  Vitals:   05/20/17 1647  BP: 102/80  Pulse: 80  Temp: 98.2 F (36.8 C)    Body mass index is 32.39 kg/m.  GENERAL: vitals reviewed and listed above, alert, oriented, appears well hydrated and in no acute distress  HEENT: atraumatic, conjunttiva clear, no obvious abnormalities on inspection of external nose and ears  NECK: no obvious masses on inspection  LUNGS: clear to auscultation bilaterally, no wheezes, rales or rhonchi, good air movement  CV: HRRR, trace ankle and LE lower leg edema equal, no pain on palpation over deep veins  MS: moves all extremities without noticeable abnormality, normal gait, strength/ROM and function LEs bilat grossly intact  PSYCH: pleasant and cooperative, no obvious depression or anxiety  ASSESSMENT AND PLAN:  Discussed the following assessment and plan:  Pain in both lower extremities - Plan: Basic metabolic panel, Hemoglobin A1c  Ankle swelling, unspecified laterality  Restless legs - Plan: Ferritin  -we discussed possible serious and likely etiologies, workup and treatment, treatment risks and return precautions - query restless leg syndrome -after this discussion, Enya opted for labs per orders, healthy low sodium diet, regular gentle exercise, if ferritin low end <45 --> supplement as is often easy fix for restless leg symptoms, if not low can talk with PCP about further eval or trial tx -follow up advised w/ PCP in 2-4 weeks -of course, we advised Charnese  to return or notify a doctor immediately if symptoms worsen or persist or new concerns arise.  Patient Instructions  BEFORE YOU LEAVE: -follow up: with Dr. Sarajane Jews in 2-4 weeks -labs  We have ordered labs or studies at this visit. It can take up to 1-2 weeks for results and processing. IF results require follow up or explanation, we will call you with  instructions. Clinically stable results will be released to your [facility]. If you have not heard from [place] or cannot find your results in [facility] in 2 weeks please contact our office at [phone].  If you are not yet signed up for [facility], please consider signing up.  Avoid processed foods, salty foods, fast foods.  Advise regular aerobic exercise (at least 150 minutes per week of sweaty exercise) and a healthy diet. Try to eat at least  5-9 servings of vegetables and fruits per day (not corn, potatoes or bananas.) Avoid sweets, red meat, pork, butter, fried foods, fast food, processed food, excessive dairy, eggs and coconut. Replace bad fats with good fats - fish, nuts and seeds, canola oil, olive oil.           Colin Benton R., DO

## 2017-05-20 NOTE — Telephone Encounter (Signed)
Spoke with pt and advised. She has been scheduled with Dr. Maudie Mercury this afternoon.

## 2017-05-20 NOTE — Patient Instructions (Signed)
BEFORE YOU LEAVE: -follow up: with Dr. Sarajane Jews in 2-4 weeks -labs  We have ordered labs or studies at this visit. It can take up to 1-2 weeks for results and processing. IF results require follow up or explanation, we will call you with instructions. Clinically stable results will be released to your [facility]. If you have not heard from [place] or cannot find your results in [facility] in 2 weeks please contact our office at [phone].  If you are not yet signed up for [facility], please consider signing up.  Avoid processed foods, salty foods, fast foods.  Advise regular aerobic exercise (at least 150 minutes per week of sweaty exercise) and a healthy diet. Try to eat at least 5-9 servings of vegetables and fruits per day (not corn, potatoes or bananas.) Avoid sweets, red meat, pork, butter, fried foods, fast food, processed food, excessive dairy, eggs and coconut. Replace bad fats with good fats - fish, nuts and seeds, canola oil, olive oil.

## 2017-05-21 LAB — BASIC METABOLIC PANEL
BUN: 15 mg/dL (ref 6–23)
CO2: 26 meq/L (ref 19–32)
Calcium: 9.9 mg/dL (ref 8.4–10.5)
Chloride: 107 mEq/L (ref 96–112)
Creatinine, Ser: 0.77 mg/dL (ref 0.40–1.20)
GFR: 100.35 mL/min (ref >60.00)
GLUCOSE: 106 mg/dL — AB (ref 70–99)
POTASSIUM: 3.9 meq/L (ref 3.5–5.1)
SODIUM: 142 meq/L (ref 135–145)

## 2017-05-21 LAB — HEMOGLOBIN A1C: Hgb A1c MFr Bld: 6.7 % — ABNORMAL HIGH (ref 4.6–6.5)

## 2017-05-21 LAB — FERRITIN: Ferritin: 117.7 ng/mL (ref 10.0–291.0)

## 2017-05-22 ENCOUNTER — Encounter: Payer: Self-pay | Admitting: Family Medicine

## 2017-05-29 ENCOUNTER — Other Ambulatory Visit: Payer: Self-pay | Admitting: Family Medicine

## 2017-05-29 DIAGNOSIS — N6310 Unspecified lump in the right breast, unspecified quadrant: Secondary | ICD-10-CM

## 2017-06-03 ENCOUNTER — Ambulatory Visit: Payer: 59 | Admitting: Family Medicine

## 2017-06-03 DIAGNOSIS — Z0289 Encounter for other administrative examinations: Secondary | ICD-10-CM

## 2017-06-10 ENCOUNTER — Inpatient Hospital Stay: Admission: RE | Admit: 2017-06-10 | Payer: 59 | Source: Ambulatory Visit

## 2017-07-08 ENCOUNTER — Ambulatory Visit
Admission: RE | Admit: 2017-07-08 | Discharge: 2017-07-08 | Disposition: A | Discharge status: Home / Self Care | Payer: 59 | Source: Ambulatory Visit | Attending: Family Medicine | Admitting: Family Medicine

## 2017-07-08 ENCOUNTER — Other Ambulatory Visit: Payer: Self-pay | Admitting: Family Medicine

## 2017-07-08 DIAGNOSIS — N6310 Unspecified lump in the right breast, unspecified quadrant: Secondary | ICD-10-CM

## 2017-08-19 ENCOUNTER — Other Ambulatory Visit: Payer: Self-pay | Admitting: Family Medicine

## 2017-08-26 ENCOUNTER — Ambulatory Visit (INDEPENDENT_AMBULATORY_CARE_PROVIDER_SITE_OTHER): Payer: BC Managed Care – PPO | Admitting: Family Medicine

## 2017-08-26 VITALS — BP 116/85 | HR 63 | Ht 63.25 in | Wt 178.0 lb

## 2017-08-26 DIAGNOSIS — M79675 Pain in left toe(s): Secondary | ICD-10-CM

## 2017-08-26 DIAGNOSIS — Z23 Encounter for immunization: Secondary | ICD-10-CM | POA: Diagnosis not present

## 2017-08-26 NOTE — Patient Instructions (Signed)
WE NOW OFFER   [name] FAST TRACK!!!  SAME DAY Appointments for ACUTE CARE  Such as: Sprains, Injuries, cuts, abrasions, rashes, muscle pain, joint pain, back pain Colds, flu, sore throats, headache, allergies, cough, fever  Ear pain, sinus and eye infections Abdominal pain, nausea, vomiting, diarrhea, upset stomach Animal/insect bites  3 Easy Ways to Schedule: Walk-In Scheduling Call in scheduling Mychart Sign-up: [email or web address]         

## 2017-08-28 ENCOUNTER — Encounter: Payer: Self-pay | Admitting: Family Medicine

## 2017-08-28 NOTE — Progress Notes (Signed)
Subjective:    Patient ID: [name], female    DOB: 03-Nov-1961, 56 y.o.   MRN: [id number]  HPI Here for 3 days of pain on top of the left great toe. No recent trauma, but she has been working out at the gym much more intensely over the past month than usual. Part of her new routine is a stair climbing machine. She has not tried anything for it so far.   Review of Systems  Constitutional: Negative.   Respiratory: Negative.   Cardiovascular: Negative.   Musculoskeletal: Positive for arthralgias.       Objective:   Physical Exam  Constitutional: She appears well-developed and well-nourished.  Normal gait   Cardiovascular: Normal rate, regular rhythm, normal heart sounds and intact distal pulses.   Pulmonary/Chest: Effort normal and breath sounds normal. No respiratory distress. She has no wheezes. She has no rales.  Musculoskeletal:  The left great toe appears normal with no swelling or warmth or erythema. She is tender along the extensor tendon, and extending the toe against resistance is painful. Flexion is normal.           Assessment & Plan:  Extensor tendonitis of the toe. This is probably due to overuse during her workouts. I advised her avoid any running, jumping, or climbing for several weeks to rest the toe. Use ice packs prn. Take Ibuprofen prn.  Gershon Crane, MD

## 2017-09-05 ENCOUNTER — Encounter: Payer: Self-pay | Admitting: Family Medicine

## 2017-09-07 ENCOUNTER — Other Ambulatory Visit: Payer: Self-pay | Admitting: Family Medicine

## 2017-09-10 MED ORDER — ALBUTEROL SULFATE HFA 108 (90 BASE) MCG/ACT IN AERS: 2.0000 | INHALATION_SPRAY | RESPIRATORY_TRACT | 11 refills | Status: DC | PRN

## 2017-09-10 NOTE — Telephone Encounter (Signed)
Can we refill this? Looks like it has been awhile since last filled.

## 2017-10-26 ENCOUNTER — Encounter: Payer: Self-pay | Admitting: Family Medicine

## 2017-10-26 LAB — HM DIABETES EYE EXAM

## 2017-10-30 ENCOUNTER — Other Ambulatory Visit: Payer: Self-pay | Admitting: Family Medicine

## 2017-10-31 NOTE — Telephone Encounter (Signed)
Sent to PCP for approval to refill potassium.

## 2017-12-25 ENCOUNTER — Encounter (HOSPITAL_COMMUNITY): Payer: Self-pay

## 2018-01-10 ENCOUNTER — Encounter: Payer: Self-pay | Admitting: Family Medicine

## 2018-01-10 ENCOUNTER — Ambulatory Visit: Payer: BC Managed Care – PPO | Admitting: Family Medicine

## 2018-01-10 VITALS — BP 102/74 | HR 79 | Temp 98.3°F | Wt 175.6 lb

## 2018-01-10 DIAGNOSIS — J018 Other acute sinusitis: Secondary | ICD-10-CM | POA: Diagnosis not present

## 2018-01-10 DIAGNOSIS — J029 Acute pharyngitis, unspecified: Secondary | ICD-10-CM | POA: Diagnosis not present

## 2018-01-10 DIAGNOSIS — R6889 Other general symptoms and signs: Secondary | ICD-10-CM | POA: Diagnosis not present

## 2018-01-10 LAB — POCT RAPID STREP A (OFFICE): Rapid Strep A Screen: NEGATIVE

## 2018-01-10 LAB — POC INFLUENZA A&B (BINAX/QUICKVUE)
INFLUENZA B, POC: NEGATIVE
Influenza A, POC: NEGATIVE

## 2018-01-10 MED ORDER — GLUCOSE BLOOD VI STRP: ORAL_STRIP | 3 refills | Status: DC

## 2018-01-10 MED ORDER — AZITHROMYCIN 250 MG PO TABS: ORAL_TABLET | ORAL | 0 refills | Status: DC

## 2018-01-10 NOTE — Progress Notes (Signed)
Subjective:    Patient ID: [name], female    DOB: 04/14/1961, 57 y.o.   MRN: [id number]  HPI Here for 10 days of stuffy head, headache, blowing green mucus from the nose, and a dry cough. Some fever. On Mucinex.    Review of Systems  Constitutional: Positive for fever.  HENT: Positive for congestion, postnasal drip, sinus pressure, sinus pain and sore throat.   Eyes: Negative.   Respiratory: Positive for cough.        Objective:   Physical Exam  Constitutional: She appears well-developed and well-nourished.  HENT:  Right Ear: External ear normal.  Left Ear: External ear normal.  Nose: Nose normal.  Mouth/Throat: Oropharynx is clear and moist.  Eyes: Conjunctivae are normal.  Neck: No thyromegaly present.  Pulmonary/Chest: Effort normal and breath sounds normal. No respiratory distress. She has no wheezes. She has no rales.  Lymphadenopathy:    She has no cervical adenopathy.          Assessment & Plan:  Sinusitis, treat with a Zpack. Gershon Crane, MD

## 2018-01-13 ENCOUNTER — Encounter: Payer: Self-pay | Admitting: Family Medicine

## 2018-01-13 MED ORDER — GLUCOSE BLOOD VI STRP: ORAL_STRIP | 3 refills | Status: DC

## 2018-01-13 MED ORDER — ONETOUCH VERIO FLEX SYSTEM W/DEVICE KIT: 1.0000 | PACK | Freq: Every day | 0 refills | Status: DC

## 2018-01-20 ENCOUNTER — Encounter: Payer: Self-pay | Admitting: Family Medicine

## 2018-01-20 ENCOUNTER — Ambulatory Visit: Payer: BC Managed Care – PPO | Admitting: Family Medicine

## 2018-01-21 NOTE — Telephone Encounter (Signed)
Call in another Zpack  

## 2018-01-22 ENCOUNTER — Telehealth: Payer: Self-pay

## 2018-01-22 ENCOUNTER — Telehealth: Payer: Self-pay | Admitting: Family Medicine

## 2018-01-22 MED ORDER — AZITHROMYCIN 250 MG PO TABS: ORAL_TABLET | ORAL | 0 refills | Status: DC

## 2018-01-22 NOTE — Telephone Encounter (Signed)
Copied from [place] [id number]. Topic: General - Other >> Jan 22, 2018  8:14 AM [name] wrote: Reason for CRM: patient calling to see if her Z pack has been called into her pharmacy please call pt when RX is ready

## 2018-01-22 NOTE — Telephone Encounter (Signed)
Rx has been called into pt's pharmacy. Pt advised that this has been done

## 2018-01-22 NOTE — Telephone Encounter (Signed)
Pt wants this sent to  [facility] [place], [place] [phone] (Phone) [phone] (Fax)

## 2018-01-22 NOTE — Telephone Encounter (Signed)
Z pac has been sent into pt's pharmacy pt advised that this has been done.

## 2018-01-27 ENCOUNTER — Ambulatory Visit (INDEPENDENT_AMBULATORY_CARE_PROVIDER_SITE_OTHER): Payer: BC Managed Care – PPO | Admitting: Family Medicine

## 2018-01-27 ENCOUNTER — Encounter: Payer: Self-pay | Admitting: Family Medicine

## 2018-01-27 VITALS — BP 118/72 | HR 87 | Temp 98.0°F | Ht 64.25 in | Wt 177.2 lb

## 2018-01-27 DIAGNOSIS — E119 Type 2 diabetes mellitus without complications: Secondary | ICD-10-CM | POA: Diagnosis not present

## 2018-01-27 DIAGNOSIS — Z0000 Encounter for general adult medical examination without abnormal findings: Secondary | ICD-10-CM | POA: Diagnosis not present

## 2018-01-27 LAB — CBC WITH DIFFERENTIAL/PLATELET
BASOS ABS: 27 {cells}/uL (ref 0–200)
Basophils Relative: 0.3 %
EOS PCT: 3.5 %
Eosinophils Absolute: 315 cells/uL (ref 15–500)
HCT: 40.1 % (ref 35.0–45.0)
Hemoglobin: 13.7 g/dL (ref 11.7–15.5)
Lymphs Abs: 2727 cells/uL (ref 850–3900)
MCH: 29.1 pg (ref 27.0–33.0)
MCHC: 34.2 g/dL (ref 32.0–36.0)
MCV: 85.3 fL (ref 80.0–100.0)
MONOS PCT: 7.3 %
MPV: 9.5 fL (ref 7.5–12.5)
NEUTROS PCT: 58.6 %
Neutro Abs: 5274 cells/uL (ref 1500–7800)
Platelets: 395 10*3/uL (ref 140–400)
RBC: 4.7 10*6/uL (ref 3.80–5.10)
RDW: 14 % (ref 11.0–15.0)
Total Lymphocyte: 30.3 %
WBC mixed population: 657 cells/uL (ref 200–950)
WBC: 9 10*3/uL (ref 3.8–10.8)

## 2018-01-27 LAB — POC URINALSYSI DIPSTICK (AUTOMATED)
BILIRUBIN UA: NEGATIVE
Glucose, UA: NEGATIVE
KETONES UA: NEGATIVE
Leukocytes, UA: NEGATIVE
Nitrite, UA: NEGATIVE
PH UA: 6 (ref 5.0–8.0)
PROTEIN UA: NEGATIVE
Urobilinogen, UA: 0.2 E.U./dL

## 2018-01-27 LAB — LIPID PANEL
CHOLESTEROL: 158 mg/dL (ref 0–200)
HDL: 39.4 mg/dL (ref >39.00)
LDL CALC: 86 mg/dL (ref 0–99)
NonHDL: 118.9
TRIGLYCERIDES: 163 mg/dL — AB (ref 0.0–149.0)
Total CHOL/HDL Ratio: 4
VLDL: 32.6 mg/dL (ref 0.0–40.0)

## 2018-01-27 LAB — BASIC METABOLIC PANEL
BUN: 16 mg/dL (ref 6–23)
CHLORIDE: 106 meq/L (ref 96–112)
CO2: 25 mEq/L (ref 19–32)
CREATININE: 0.72 mg/dL (ref 0.40–1.20)
Calcium: 9.5 mg/dL (ref 8.4–10.5)
GFR: 108.16 mL/min (ref >60.00)
Glucose, Bld: 133 mg/dL — ABNORMAL HIGH (ref 70–99)
Potassium: 4.1 mEq/L (ref 3.5–5.1)
Sodium: 141 mEq/L (ref 135–145)

## 2018-01-27 LAB — HEPATIC FUNCTION PANEL
ALK PHOS: 118 U/L — AB (ref 39–117)
ALT: 34 U/L (ref 0–35)
AST: 29 U/L (ref 0–37)
Albumin: 4.4 g/dL (ref 3.5–5.2)
BILIRUBIN DIRECT: 0.1 mg/dL (ref 0.0–0.3)
TOTAL PROTEIN: 6.7 g/dL (ref 6.0–8.3)
Total Bilirubin: 0.6 mg/dL (ref 0.2–1.2)

## 2018-01-27 LAB — HEMOGLOBIN A1C: HEMOGLOBIN A1C: 6.4 % (ref 4.6–6.5)

## 2018-01-27 LAB — TSH: TSH: 0.96 u[IU]/mL (ref 0.35–4.50)

## 2018-01-27 MED ORDER — OMEPRAZOLE 40 MG PO CPDR: 40.0000 mg | DELAYED_RELEASE_CAPSULE | Freq: Every day | ORAL | 3 refills | Status: DC

## 2018-01-27 NOTE — Progress Notes (Signed)
Subjective:    Patient ID: [name], female    DOB: [birth year], 57 y.o.   MRN: [id number]  HPI Here for a well exam. She has a few complaints. She was treated recently for a sinus infection and this resolved with a Zpack. However she has had a lingering dry cough since then. Concurrently she has had a lot of heartburn and belching lately.    Review of Systems  Constitutional: Negative.   HENT: Negative.   Eyes: Negative.   Respiratory: Positive for cough. Negative for chest tightness, shortness of breath and wheezing.   Cardiovascular: Negative.   Gastrointestinal: Negative.   Genitourinary: Negative for decreased urine volume, difficulty urinating, dyspareunia, dysuria, enuresis, flank pain, frequency, hematuria, pelvic pain and urgency.  Musculoskeletal: Negative.   Skin: Negative.   Neurological: Negative.   Psychiatric/Behavioral: Negative.        Objective:   Physical Exam  Constitutional: She is oriented to person, place, and time. She appears well-developed and well-nourished. No distress.  HENT:  Head: Normocephalic and atraumatic.  Right Ear: External ear normal.  Left Ear: External ear normal.  Nose: Nose normal.  Mouth/Throat: Oropharynx is clear and moist. No oropharyngeal exudate.  Eyes: Conjunctivae and EOM are normal. Pupils are equal, round, and reactive to light. No scleral icterus.  Neck: Normal range of motion. Neck supple. No JVD present. No thyromegaly present.  Cardiovascular: Normal rate, regular rhythm, normal heart sounds and intact distal pulses. Exam reveals no gallop and no friction rub.  No murmur heard. Pulmonary/Chest: Effort normal and breath sounds normal. No respiratory distress. She has no wheezes. She has no rales. She exhibits no tenderness.  Abdominal: Soft. Bowel sounds are normal. She exhibits no distension and no mass. There is no tenderness. There is no rebound and no guarding.  Musculoskeletal: Normal range of motion. She  exhibits no edema or tenderness.  Lymphadenopathy:    She has no cervical adenopathy.  Neurological: She is alert and oriented to person, place, and time. She has normal reflexes. No cranial nerve deficit. She exhibits normal muscle tone. Coordination normal.  Skin: Skin is warm and dry. No rash noted. No erythema.  Psychiatric: She has a normal mood and affect. Her behavior is normal. Judgment and thought content normal.          Assessment & Plan:  Well exam. We discussed diet and exercise. Get fasting labs. I think the recent sinus infection has stirred up her GERD, and this has led to the cough. She will get back on Omeprazole daily.  Gershon Crane, MD

## 2018-02-15 ENCOUNTER — Other Ambulatory Visit: Payer: Self-pay | Admitting: Family Medicine

## 2018-04-24 ENCOUNTER — Ambulatory Visit: Payer: Self-pay | Admitting: *Deleted

## 2018-04-24 NOTE — Telephone Encounter (Signed)
Pt is on schedule for tomorrow 04/25/2018 to see Dr. Sarajane Jews.

## 2018-04-24 NOTE — Telephone Encounter (Signed)
Pt called with pain on the right lower abd near the groin. She states it feels almost like a balloon there sometime. It hurts when she walks or tries to exercise. She denies having a fever, n/v, diarrhea or constipation. Denies tender when touching the area. Appointment made per protocol. Will route to flow at [facility] at [place]. Pt voiced understanding that she is to go to an urgent care if the pain gets worse or worsening symptoms.  Reason for Disposition . [1] MODERATE pain (e.g., interferes with normal activities) AND [2] pain comes and goes (cramps) AND [3] present > 24 hours  (Exception: pain with Vomiting or Diarrhea - see that Guideline)  Answer Assessment - Initial Assessment Questions 1. LOCATION: "Where does it hurt?"      Right lower abd near groin 2. RADIATION: "Does the pain shoot anywhere else?" (e.g., chest, back)     no 3. ONSET: "When did the pain begin?" (e.g., minutes, hours or days ago)     2 months 4. SUDDEN: "Gradual or sudden onset?"     gradual 5. PATTERN "Does the pain come and go, or is it constant?"    - If constant: "Is it getting better, staying the same, or worsening?"      (Note: Constant means the pain never goes away completely; most serious pain is constant and it progresses)     - If intermittent: "How long does it last?" "Do you have pain now?"     (Note: Intermittent means the pain goes away completely between bouts)     Comes and goes. Exercising and pulling and walking makes it worst 6. SEVERITY: "How bad is the pain?"  (e.g., Scale 1-10; mild, moderate, or severe)   - MILD (1-3): doesn't interfere with normal activities, abdomen soft and not tender to touch    - MODERATE (4-7): interferes with normal activities or awakens from sleep, tender to touch    - SEVERE (8-10): excruciating pain, doubled over, unable to do any normal activities      Pain moderate 7. RECURRENT SYMPTOM: "Have you ever had this type of abdominal pain before?" If so, ask: "When  was the last time?" and "What happened that time?"      Yes but went away 8. CAUSE: "What do you think is causing the abdominal pain?"     Not sure 9. RELIEVING/AGGRAVATING FACTORS: "What makes it better or worse?" (e.g., movement, antacids, bowel movement)     Nothing makes it better 10. OTHER SYMPTOMS: "Has there been any vomiting, diarrhea, constipation, or urine problems?"       no 11. PREGNANCY: "Is there any chance you are pregnant?" "When was your last menstrual period?"       No periods  Protocols used: ABDOMINAL PAIN - [facility]

## 2018-04-25 ENCOUNTER — Ambulatory Visit: Payer: BC Managed Care – PPO | Admitting: Family Medicine

## 2018-04-25 ENCOUNTER — Encounter: Payer: Self-pay | Admitting: Family Medicine

## 2018-04-25 VITALS — BP 102/60 | HR 78 | Temp 98.4°F | Ht 64.25 in | Wt 183.0 lb

## 2018-04-25 DIAGNOSIS — J3089 Other allergic rhinitis: Secondary | ICD-10-CM | POA: Diagnosis not present

## 2018-04-25 DIAGNOSIS — R1031 Right lower quadrant pain: Secondary | ICD-10-CM

## 2018-04-25 NOTE — Progress Notes (Signed)
Subjective:    Patient ID: [name], female    DOB: May 18, 1961, 57 y.o.   MRN: [id number]  HPI Here for several issues. First she has dealt with an intermittent dull mild pain in the RLQ of the abdomen for the past year. This is never severe. It lasts for a few minutes to an hour at a time and then it goes away. No urinary symptoms. She has IBS and tends to be constipated most of the time. She also has a lot of bloating and passes a lot of gas. No fever. She has mild nausea at times but never vomits. Also the pollen outside is bothering her, causing itchy eyes, runny nose, and sneezing. No cough or ST. Using Benadryl.    Review of Systems  Constitutional: Negative.   HENT: Positive for congestion, postnasal drip, rhinorrhea and sinus pressure. Negative for sinus pain and sore throat.   Eyes: Positive for itching. Negative for discharge and redness.  Respiratory: Negative.   Gastrointestinal: Positive for abdominal distention, abdominal pain, constipation and nausea. Negative for anal bleeding, blood in stool, diarrhea, rectal pain and vomiting.  Genitourinary: Negative.   Neurological: Negative.        Objective:   Physical Exam  Constitutional: She appears well-developed and well-nourished.  HENT:  Right Ear: External ear normal.  Left Ear: External ear normal.  Nose: Nose normal.  Mouth/Throat: Oropharynx is clear and moist.  Eyes: Conjunctivae are normal.  Neck: Neck supple. No thyromegaly present.  Cardiovascular: Normal rate, regular rhythm, normal heart sounds and intact distal pulses.  Pulmonary/Chest: Effort normal and breath sounds normal.  Abdominal: Soft. Bowel sounds are normal. She exhibits no distension and no mass. There is no rebound and no guarding. No hernia.  Mildly tender in the RLQ   Lymphadenopathy:    She has no cervical adenopathy.          Assessment & Plan:  I think her abdominal pain is due to gas building up behind the ileocecal valve. I  suggested she take a probiotic like Align daily to reduce the production of intestinal gas and to take Miralax daily to help the constipation. As for her allergies, she can take Claritin or Zyrtec daily as needed.  Gershon Crane, MD

## 2018-04-29 ENCOUNTER — Ambulatory Visit: Payer: BC Managed Care – PPO | Admitting: Family Medicine

## 2018-05-08 ENCOUNTER — Emergency Department (HOSPITAL_COMMUNITY)
Admission: EM | Admit: 2018-05-08 | Discharge: 2018-05-09 | Disposition: A | Discharge status: Home / Self Care | Payer: No Typology Code available for payment source | Attending: Emergency Medicine | Admitting: Emergency Medicine

## 2018-05-08 ENCOUNTER — Other Ambulatory Visit: Payer: Self-pay

## 2018-05-08 ENCOUNTER — Encounter (HOSPITAL_COMMUNITY): Payer: Self-pay | Admitting: *Deleted

## 2018-05-08 DIAGNOSIS — E119 Type 2 diabetes mellitus without complications: Secondary | ICD-10-CM | POA: Insufficient documentation

## 2018-05-08 DIAGNOSIS — R0789 Other chest pain: Secondary | ICD-10-CM | POA: Diagnosis not present

## 2018-05-08 DIAGNOSIS — Y92410 Unspecified street and highway as the place of occurrence of the external cause: Secondary | ICD-10-CM | POA: Diagnosis not present

## 2018-05-08 DIAGNOSIS — R51 Headache: Secondary | ICD-10-CM | POA: Insufficient documentation

## 2018-05-08 DIAGNOSIS — S20229A Contusion of unspecified back wall of thorax, initial encounter: Secondary | ICD-10-CM | POA: Insufficient documentation

## 2018-05-08 DIAGNOSIS — S8011XA Contusion of right lower leg, initial encounter: Secondary | ICD-10-CM | POA: Insufficient documentation

## 2018-05-08 DIAGNOSIS — I10 Essential (primary) hypertension: Secondary | ICD-10-CM | POA: Diagnosis not present

## 2018-05-08 DIAGNOSIS — M549 Dorsalgia, unspecified: Secondary | ICD-10-CM

## 2018-05-08 DIAGNOSIS — Z79899 Other long term (current) drug therapy: Secondary | ICD-10-CM | POA: Insufficient documentation

## 2018-05-08 DIAGNOSIS — R109 Unspecified abdominal pain: Secondary | ICD-10-CM | POA: Diagnosis not present

## 2018-05-08 DIAGNOSIS — Y939 Activity, unspecified: Secondary | ICD-10-CM | POA: Diagnosis not present

## 2018-05-08 DIAGNOSIS — Z91010 Allergy to peanuts: Secondary | ICD-10-CM | POA: Diagnosis not present

## 2018-05-08 DIAGNOSIS — S8012XA Contusion of left lower leg, initial encounter: Secondary | ICD-10-CM | POA: Diagnosis not present

## 2018-05-08 DIAGNOSIS — T07XXXA Unspecified multiple injuries, initial encounter: Secondary | ICD-10-CM

## 2018-05-08 DIAGNOSIS — Z87891 Personal history of nicotine dependence: Secondary | ICD-10-CM | POA: Diagnosis not present

## 2018-05-08 DIAGNOSIS — S299XXA Unspecified injury of thorax, initial encounter: Secondary | ICD-10-CM | POA: Diagnosis present

## 2018-05-08 DIAGNOSIS — Y999 Unspecified external cause status: Secondary | ICD-10-CM | POA: Insufficient documentation

## 2018-05-08 MED ORDER — SODIUM CHLORIDE 0.9 % IV SOLN
INTRAVENOUS | Status: DC
  Administered 2018-05-09: 125 mL/h via INTRAVENOUS

## 2018-05-08 MED ORDER — SODIUM CHLORIDE 0.9 % IV BOLUS
500.0000 mL | Freq: Once | INTRAVENOUS | Status: AC
  Administered 2018-05-09: 500 mL via INTRAVENOUS

## 2018-05-08 MED ORDER — ONDANSETRON HCL 4 MG/2ML IJ SOLN
4.0000 mg | Freq: Once | INTRAMUSCULAR | Status: AC
  Administered 2018-05-09: 4 mg via INTRAVENOUS
  Filled 2018-05-08: qty 2

## 2018-05-08 MED ORDER — FENTANYL CITRATE (PF) 100 MCG/2ML IJ SOLN
50.0000 ug | INTRAMUSCULAR | Status: DC | PRN
  Administered 2018-05-09: 50 ug via INTRAVENOUS
  Filled 2018-05-08: qty 2

## 2018-05-08 NOTE — ED Notes (Signed)
Received patient from [place], [place]
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
## 2018-05-08 NOTE — ED Notes (Signed)
EDP at bedside.[name]

## 2018-05-09 ENCOUNTER — Emergency Department (HOSPITAL_COMMUNITY): Payer: No Typology Code available for payment source

## 2018-05-09 DIAGNOSIS — S20229A Contusion of unspecified back wall of thorax, initial encounter: Secondary | ICD-10-CM | POA: Diagnosis not present

## 2018-05-09 LAB — URINALYSIS, ROUTINE W REFLEX MICROSCOPIC
Bacteria, UA: NONE SEEN
Bilirubin Urine: NEGATIVE
GLUCOSE, UA: NEGATIVE mg/dL
Ketones, ur: NEGATIVE mg/dL
Leukocytes, UA: NEGATIVE
Nitrite: NEGATIVE
PROTEIN: NEGATIVE mg/dL
Specific Gravity, Urine: 1.008 (ref 1.005–1.030)
pH: 7 (ref 5.0–8.0)

## 2018-05-09 LAB — CBC WITH DIFFERENTIAL/PLATELET
Abs Immature Granulocytes: 0 10*3/uL (ref 0.0–0.1)
BASOS ABS: 0 10*3/uL (ref 0.0–0.1)
BASOS PCT: 0 %
EOS ABS: 0.3 10*3/uL (ref 0.0–0.7)
Eosinophils Relative: 3 %
HCT: 40.6 % (ref 36.0–46.0)
Hemoglobin: 13.7 g/dL (ref 12.0–15.0)
IMMATURE GRANULOCYTES: 0 %
Lymphocytes Relative: 31 %
Lymphs Abs: 3.3 10*3/uL (ref 0.7–4.0)
MCH: 29.3 pg (ref 26.0–34.0)
MCHC: 33.7 g/dL (ref 30.0–36.0)
MCV: 86.8 fL (ref 78.0–100.0)
Monocytes Absolute: 0.7 10*3/uL (ref 0.1–1.0)
Monocytes Relative: 7 %
NEUTROS PCT: 59 %
Neutro Abs: 6.3 10*3/uL (ref 1.7–7.7)
Platelets: 311 10*3/uL (ref 150–400)
RBC: 4.68 MIL/uL (ref 3.87–5.11)
RDW: 13.7 % (ref 11.5–15.5)
WBC: 10.7 10*3/uL — AB (ref 4.0–10.5)

## 2018-05-09 LAB — COMPREHENSIVE METABOLIC PANEL
ALBUMIN: 4.2 g/dL (ref 3.5–5.0)
ALT: 17 U/L (ref 14–54)
AST: 26 U/L (ref 15–41)
Alkaline Phosphatase: 150 U/L — ABNORMAL HIGH (ref 38–126)
Anion gap: 11 (ref 5–15)
BUN: 13 mg/dL (ref 6–20)
CO2: 22 mmol/L (ref 22–32)
Calcium: 9.1 mg/dL (ref 8.9–10.3)
Chloride: 108 mmol/L (ref 101–111)
Creatinine, Ser: 0.74 mg/dL (ref 0.44–1.00)
GFR calc Af Amer: >60 mL/min (ref >60)
GLUCOSE: 139 mg/dL — AB (ref 65–99)
POTASSIUM: 4.7 mmol/L (ref 3.5–5.1)
Sodium: 141 mmol/L (ref 135–145)
Total Bilirubin: 1 mg/dL (ref 0.3–1.2)
Total Protein: 6.9 g/dL (ref 6.5–8.1)

## 2018-05-09 LAB — PREGNANCY, URINE: Preg Test, Ur: NEGATIVE

## 2018-05-09 MED ORDER — METHYLPREDNISOLONE SODIUM SUCC 125 MG IJ SOLR
125.0000 mg | Freq: Once | INTRAMUSCULAR | Status: AC
  Administered 2018-05-09: 125 mg via INTRAVENOUS
  Filled 2018-05-09: qty 2

## 2018-05-09 MED ORDER — FAMOTIDINE IN NACL 20-0.9 MG/50ML-% IV SOLN
20.0000 mg | Freq: Once | INTRAVENOUS | Status: AC
  Administered 2018-05-09: 20 mg via INTRAVENOUS
  Filled 2018-05-09: qty 50

## 2018-05-09 MED ORDER — CYCLOBENZAPRINE HCL 10 MG PO TABS: 10.0000 mg | ORAL_TABLET | Freq: Three times a day (TID) | ORAL | 0 refills | Status: DC | PRN

## 2018-05-09 MED ORDER — DIPHENHYDRAMINE HCL 50 MG/ML IJ SOLN
50.0000 mg | Freq: Once | INTRAMUSCULAR | Status: AC
  Administered 2018-05-09: 50 mg via INTRAVENOUS
  Filled 2018-05-09: qty 1

## 2018-05-09 MED ORDER — IOHEXOL 300 MG/ML  SOLN
100.0000 mL | Freq: Once | INTRAMUSCULAR | Status: AC | PRN
  Administered 2018-05-09: 100 mL via INTRAVENOUS

## 2018-05-09 NOTE — ED Notes (Signed)
Patient back from CT with reports of tongue swelling and itchiness to facial. MD aware

## 2018-05-09 NOTE — ED Notes (Signed)
Received call from CT. Patient needs a pregnancy before scan. Lab called. Will add on
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
## 2018-06-04 ENCOUNTER — Ambulatory Visit: Payer: BC Managed Care – PPO | Admitting: Family Medicine

## 2018-06-04 ENCOUNTER — Encounter: Payer: Self-pay | Admitting: Family Medicine

## 2018-06-04 VITALS — BP 122/76 | HR 92 | Temp 98.2°F | Ht 64.0 in | Wt 188.2 lb

## 2018-06-04 DIAGNOSIS — S8010XD Contusion of unspecified lower leg, subsequent encounter: Secondary | ICD-10-CM

## 2018-06-04 DIAGNOSIS — S161XXD Strain of muscle, fascia and tendon at neck level, subsequent encounter: Secondary | ICD-10-CM | POA: Diagnosis not present

## 2018-06-04 DIAGNOSIS — S46911D Strain of unspecified muscle, fascia and tendon at shoulder and upper arm level, right arm, subsequent encounter: Secondary | ICD-10-CM | POA: Diagnosis not present
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
## 2018-06-10 ENCOUNTER — Encounter: Payer: Self-pay | Admitting: Physical Therapy

## 2018-06-10 ENCOUNTER — Ambulatory Visit: Payer: BC Managed Care – PPO | Attending: Family Medicine | Admitting: Physical Therapy

## 2018-06-10 ENCOUNTER — Other Ambulatory Visit: Payer: Self-pay

## 2018-06-10 DIAGNOSIS — M25511 Pain in right shoulder: Secondary | ICD-10-CM

## 2018-06-10 DIAGNOSIS — M545 Low back pain, unspecified: Secondary | ICD-10-CM

## 2018-06-10 DIAGNOSIS — M62838 Other muscle spasm: Secondary | ICD-10-CM | POA: Insufficient documentation

## 2018-06-10 DIAGNOSIS — M542 Cervicalgia: Secondary | ICD-10-CM

## 2018-06-10 NOTE — Patient Instructions (Addendum)
TENS 7000  Can order on [employer] Needling  . What is Trigger Point Dry Needling (DN)? o DN is a physical therapy technique used to treat muscle pain and dysfunction. Specifically, DN helps deactivate muscle trigger points (muscle knots).  o A thin filiform needle is used to penetrate the skin and stimulate the underlying trigger point. The goal is for a local twitch response (LTR) to occur and for the trigger point to relax. No medication of any kind is injected during the procedure.   . What Does Trigger Point Dry Needling Feel Like?  o The procedure feels different for each individual patient. Some patients report that they do not actually feel the needle enter the skin and overall the process is not painful. Very mild bleeding may occur. However, many patients feel a deep cramping in the muscle in which the needle was inserted. This is the local twitch response.   Marland Kitchen How Will I feel after the treatment? o Soreness is normal, and the onset of soreness may not occur for a few hours. Typically this soreness does not last longer than two days.  o Bruising is uncommon, however; ice can be used to decrease any possible bruising.  o In rare cases feeling tired or nauseous after the treatment is normal. In addition, your symptoms may get worse before they get better, this period will typically not last longer than 24 hours.   . What Can I do After My Treatment? o Increase your hydration by drinking more water for the next 24 hours. o You may place ice or heat on the areas treated that have become sore, however, do not use heat on inflamed or bruised areas. Heat often brings more relief post needling. o You can continue your regular activities, but vigorous activity is not recommended initially after the treatment for 24 hours. o DN is best combined with other physical therapy such as strengthening, stretching, and other therapies.    [facility] [address], [place], [place] [address] Phone # [phone] Fax [phone]  Access Code: [email or web address]  URL: [email or web address]  Date: 06/10/2018  Prepared by: Lovett Calender   Exercises  Seated Cervical Rotation AROM - 10 reps - 1 sets - 3x daily - 7x weekly  Standing Cervical Sidebending AROM - 10 reps - 1 sets - 3x daily - 7x weekly

## 2018-06-10 NOTE — Therapy (Signed)
[facility] [address] [place], [place], [address] Phone: [phone]   Fax:  [phone]  Physical Therapy Evaluation  Patient Details  Name: [name] MRN: [id number] Date of Birth: [birth year] Referring Provider: Laurey Morale, MD   Encounter Date: 06/10/2018  PT End of Session - 06/10/18 1606    Visit Number  1    Date for PT Re-Evaluation  08/05/18    PT Start Time  1615    PT Stop Time  1703    PT Time Calculation (min)  48 min    Activity Tolerance  Patient tolerated treatment well       Past Medical History:  Diagnosis Date  . Allergy    Dr Caprice Red  . Cardiac arrhythmia   . Depression   . Diabetes mellitus without complication ([place])   . Fibroids    uterine sees Dr. Carlota Raspberry in [place]   . Genital warts   . Heart murmur   . Hypertension   . Irritable bowel syndrome (IBS)   . Low back pain    has a herniated lumbar disc sees Dr Marlou Sa    Past Surgical History:  Procedure Laterality Date  . ABDOMINOPLASTY  04/2013  . COLONOSCOPY  02-10-15   per Dr. Hilarie Fredrickson, clear, repeat in 10 yrs   . LUMBAR LAMINECTOMY     L3-4 on 02-24-10 per Dr Louanne Skye   . MYOMECTOMY     laser ablation of uterine fibroids per Dr Ouida Sills 2000  . TUBAL LIGATION      There were no vitals filed for this visit.   Subjective Assessment - 06/10/18 1607    Subjective  Pt was going to a chiropractor for right arm and neck and still having issues.  States she is having shooting pain in back of the neck and lower back and feeling it down into right arm.  Pt also reports increaesd occurrance of HA and had some memory loss after the accident.  States she has been having difficutly remembering how to spell easy words like "tax" and "little".      Limitations  Walking;Lifting;House hold activities    How long can you walk comfortably?  1 mile (was walking 6 miles 3x/week)    Patient Stated Goals  return to being able to  dressing, house chores, driving, walking    Currently in Pain?  Yes    Pain Score  9  wakes from sleep    Pain Location  Arm    Pain Orientation  Right;Medial;Upper    Pain Descriptors / Indicators  Shooting;Burning    Pain Type  Acute pain    Pain Radiating Towards  from shoulder to medial upper arm (ulnar)    Pain Onset  More than a month ago    Pain Frequency  Intermittent    Aggravating Factors   using the right arm a lot like cooking, lying down is the worst    Pain Relieving Factors  resting arm on a pillow    Effect of Pain on Daily Activities  can't do as much    Multiple Pain Sites  Yes    Pain Score  10 shooting occasionally    Pain Location  Neck    Pain Orientation  Right;Left;Mid;Lower    Pain Descriptors / Indicators  Shooting;Aching;Burning    Pain Type  Acute pain    Pain Onset  More than a month ago    Pain Frequency  Intermittent  Type 2 diabetes mellitus without complication, without long-term current use of insulin ([place]) 02/22/2017  . Loss of transverse plantar arch 10/31/2016  . Pronation deformity of ankle, acquired 10/31/2016  . Patellofemoral pain syndrome 09/23/2015  . Diabetes mellitus, type II ([place]) 04/27/2015  . OSA (obstructive sleep apnea) 03/31/2014  . Asthma, chronic 08/14/2013  . Leg edema 08/14/2013  . Costochondritis 04/10/2013  . DEGENERATIVE JOINT DISEASE 09/11/2010  . SYNCOPE 07/12/2010  . VAGINITIS, BACTERIAL 06/28/2010  . GERD 03/17/2010  . PALPITATIONS 03/17/2010  . CHEST PAIN 03/17/2010  .  ACUTE BRONCHITIS 01/23/2010  . CANDIDIASIS OF VULVA AND VAGINA 01/06/2010  . DEPRESSION, MODERATE, RECURRENT 01/06/2010  . LOW BACK PAIN 01/06/2010  . SCIATICA 08/30/2009  . TROCHANTERIC BURSITIS 08/09/2009  . ABDOMINAL PAIN RIGHT LOWER QUADRANT 01/12/2008  . MORTON'S NEUROMA 09/30/2007  . ETHMOID SINUSITIS 09/30/2007  . LEG PAIN, BILATERAL 09/30/2007  . Abdominal pain, other specified site 09/30/2007  . DEPRESSION 09/22/2007  . Essential hypertension 09/22/2007    Zannie Cove, PT 06/10/2018, 6:25 PM  [facility] [address] [place], [place], [address] Phone: [phone]   Fax:  [phone]  Name: [name] MRN: [id number] Date of Birth: 1961-08-23  [facility] [address] [place], [place], [address] Phone: [phone]   Fax:  [phone]  Physical Therapy Evaluation  Patient Details  Name: [name] MRN: [id number] Date of Birth: [birth year] Referring Provider: Laurey Morale, MD   Encounter Date: 06/10/2018  PT End of Session - 06/10/18 1606    Visit Number  1    Date for PT Re-Evaluation  08/05/18    PT Start Time  1615    PT Stop Time  1703    PT Time Calculation (min)  48 min    Activity Tolerance  Patient tolerated treatment well       Past Medical History:  Diagnosis Date  . Allergy    Dr Caprice Red  . Cardiac arrhythmia   . Depression   . Diabetes mellitus without complication ([place])   . Fibroids    uterine sees Dr. Carlota Raspberry in [place]   . Genital warts   . Heart murmur   . Hypertension   . Irritable bowel syndrome (IBS)   . Low back pain    has a herniated lumbar disc sees Dr Marlou Sa    Past Surgical History:  Procedure Laterality Date  . ABDOMINOPLASTY  04/2013  . COLONOSCOPY  02-10-15   per Dr. Hilarie Fredrickson, clear, repeat in 10 yrs   . LUMBAR LAMINECTOMY     L3-4 on 02-24-10 per Dr Louanne Skye   . MYOMECTOMY     laser ablation of uterine fibroids per Dr Ouida Sills 2000  . TUBAL LIGATION      There were no vitals filed for this visit.   Subjective Assessment - 06/10/18 1607    Subjective  Pt was going to a chiropractor for right arm and neck and still having issues.  States she is having shooting pain in back of the neck and lower back and feeling it down into right arm.  Pt also reports increaesd occurrance of HA and had some memory loss after the accident.  States she has been having difficutly remembering how to spell easy words like "tax" and "little".      Limitations  Walking;Lifting;House hold activities    How long can you walk comfortably?  1 mile (was walking 6 miles 3x/week)    Patient Stated Goals  return to being able to  dressing, house chores, driving, walking    Currently in Pain?  Yes    Pain Score  9  wakes from sleep    Pain Location  Arm    Pain Orientation  Right;Medial;Upper    Pain Descriptors / Indicators  Shooting;Burning    Pain Type  Acute pain    Pain Radiating Towards  from shoulder to medial upper arm (ulnar)    Pain Onset  More than a month ago    Pain Frequency  Intermittent    Aggravating Factors   using the right arm a lot like cooking, lying down is the worst    Pain Relieving Factors  resting arm on a pillow    Effect of Pain on Daily Activities  can't do as much    Multiple Pain Sites  Yes    Pain Score  10 shooting occasionally    Pain Location  Neck    Pain Orientation  Right;Left;Mid;Lower    Pain Descriptors / Indicators  Shooting;Aching;Burning    Pain Type  Acute pain    Pain Onset  More than a month ago    Pain Frequency  Intermittent  Type 2 diabetes mellitus without complication, without long-term current use of insulin ([place]) 02/22/2017  . Loss of transverse plantar arch 10/31/2016  . Pronation deformity of ankle, acquired 10/31/2016  . Patellofemoral pain syndrome 09/23/2015  . Diabetes mellitus, type II ([place]) 04/27/2015  . OSA (obstructive sleep apnea) 03/31/2014  . Asthma, chronic 08/14/2013  . Leg edema 08/14/2013  . Costochondritis 04/10/2013  . DEGENERATIVE JOINT DISEASE 09/11/2010  . SYNCOPE 07/12/2010  . VAGINITIS, BACTERIAL 06/28/2010  . GERD 03/17/2010  . PALPITATIONS 03/17/2010  . CHEST PAIN 03/17/2010  .  ACUTE BRONCHITIS 01/23/2010  . CANDIDIASIS OF VULVA AND VAGINA 01/06/2010  . DEPRESSION, MODERATE, RECURRENT 01/06/2010  . LOW BACK PAIN 01/06/2010  . SCIATICA 08/30/2009  . TROCHANTERIC BURSITIS 08/09/2009  . ABDOMINAL PAIN RIGHT LOWER QUADRANT 01/12/2008  . MORTON'S NEUROMA 09/30/2007  . ETHMOID SINUSITIS 09/30/2007  . LEG PAIN, BILATERAL 09/30/2007  . Abdominal pain, other specified site 09/30/2007  . DEPRESSION 09/22/2007  . Essential hypertension 09/22/2007    Zannie Cove, PT 06/10/2018, 6:25 PM  [facility] [address] [place], [place], [address] Phone: [phone]   Fax:  [phone]  Name: [name] MRN: [id number] Date of Birth: 1961-08-23  Type 2 diabetes mellitus without complication, without long-term current use of insulin ([place]) 02/22/2017  . Loss of transverse plantar arch 10/31/2016  . Pronation deformity of ankle, acquired 10/31/2016  . Patellofemoral pain syndrome 09/23/2015  . Diabetes mellitus, type II ([place]) 04/27/2015  . OSA (obstructive sleep apnea) 03/31/2014  . Asthma, chronic 08/14/2013  . Leg edema 08/14/2013  . Costochondritis 04/10/2013  . DEGENERATIVE JOINT DISEASE 09/11/2010  . SYNCOPE 07/12/2010  . VAGINITIS, BACTERIAL 06/28/2010  . GERD 03/17/2010  . PALPITATIONS 03/17/2010  . CHEST PAIN 03/17/2010  .  ACUTE BRONCHITIS 01/23/2010  . CANDIDIASIS OF VULVA AND VAGINA 01/06/2010  . DEPRESSION, MODERATE, RECURRENT 01/06/2010  . LOW BACK PAIN 01/06/2010  . SCIATICA 08/30/2009  . TROCHANTERIC BURSITIS 08/09/2009  . ABDOMINAL PAIN RIGHT LOWER QUADRANT 01/12/2008  . MORTON'S NEUROMA 09/30/2007  . ETHMOID SINUSITIS 09/30/2007  . LEG PAIN, BILATERAL 09/30/2007  . Abdominal pain, other specified site 09/30/2007  . DEPRESSION 09/22/2007  . Essential hypertension 09/22/2007    Zannie Cove, PT 06/10/2018, 6:25 PM  [facility] [address] [place], [place], [address] Phone: [phone]   Fax:  [phone]  Name: [name] MRN: [id number] Date of Birth: 1961-08-23

## 2018-06-11 ENCOUNTER — Ambulatory Visit: Payer: BC Managed Care – PPO | Admitting: Physical Therapy

## 2018-06-11 ENCOUNTER — Encounter: Payer: Self-pay | Admitting: Physical Therapy

## 2018-06-11 DIAGNOSIS — M62838 Other muscle spasm: Secondary | ICD-10-CM

## 2018-06-11 DIAGNOSIS — M542 Cervicalgia: Secondary | ICD-10-CM | POA: Diagnosis not present

## 2018-06-11 DIAGNOSIS — M545 Low back pain, unspecified: Secondary | ICD-10-CM

## 2018-06-11 DIAGNOSIS — M25511 Pain in right shoulder: Secondary | ICD-10-CM

## 2018-06-11 NOTE — Therapy (Signed)
re-education;Patient/family education    PT Next Visit Plan  STM, dry needling to cervical and upper traps, lumbar and cervical gentle ROM, shoulder ROM, f/u on TENS, estim, ice, modalities for pain management, cervical traction    PT Home Exercise Plan   Access Code: [email or web address]     Consulted and Agree with Plan of Care  Patient       Patient will benefit from skilled therapeutic intervention in order to improve the following deficits and impairments:  Abnormal gait, Pain, Increased fascial restricitons, Decreased strength, Decreased range of motion, Postural dysfunction, Increased muscle spasms, Impaired UE functional  use  Visit Diagnosis: Other muscle spasm  Acute pain of right shoulder  Cervicalgia  Acute low back pain without sciatica, unspecified back pain laterality     Problem List Patient Active Problem List   Diagnosis Date Noted  . Type 2 diabetes mellitus without complication, without long-term current use of insulin ([place]) 02/22/2017  . Loss of transverse plantar arch 10/31/2016  . Pronation deformity of ankle, acquired 10/31/2016  . Patellofemoral pain syndrome 09/23/2015  . Diabetes mellitus, type II ([place]) 04/27/2015  . OSA (obstructive sleep apnea) 03/31/2014  . Asthma, chronic 08/14/2013  . Leg edema 08/14/2013  . Costochondritis 04/10/2013  . DEGENERATIVE JOINT DISEASE 09/11/2010  . SYNCOPE 07/12/2010  . VAGINITIS, BACTERIAL 06/28/2010  . GERD 03/17/2010  . PALPITATIONS 03/17/2010  . CHEST PAIN 03/17/2010  . ACUTE BRONCHITIS 01/23/2010  . CANDIDIASIS OF VULVA AND VAGINA 01/06/2010  . DEPRESSION, MODERATE, RECURRENT 01/06/2010  . LOW BACK PAIN 01/06/2010  . SCIATICA 08/30/2009  . TROCHANTERIC BURSITIS 08/09/2009  . ABDOMINAL PAIN RIGHT LOWER QUADRANT 01/12/2008  . MORTON'S NEUROMA 09/30/2007  . ETHMOID SINUSITIS 09/30/2007  . LEG PAIN, BILATERAL 09/30/2007  . Abdominal pain, other specified site 09/30/2007  . DEPRESSION 09/22/2007  . Essential hypertension 09/22/2007    Nely Dedmon, PTA 06/11/2018, 4:50 PM  [facility] [address], [place], [place], [address] Phone: [phone]   Fax:  [phone]  Name: [name] MRN: [id number] Date of Birth: 1961-07-23  re-education;Patient/family education    PT Next Visit Plan  STM, dry needling to cervical and upper traps, lumbar and cervical gentle ROM, shoulder ROM, f/u on TENS, estim, ice, modalities for pain management, cervical traction    PT Home Exercise Plan   Access Code: [email or web address]     Consulted and Agree with Plan of Care  Patient       Patient will benefit from skilled therapeutic intervention in order to improve the following deficits and impairments:  Abnormal gait, Pain, Increased fascial restricitons, Decreased strength, Decreased range of motion, Postural dysfunction, Increased muscle spasms, Impaired UE functional  use  Visit Diagnosis: Other muscle spasm  Acute pain of right shoulder  Cervicalgia  Acute low back pain without sciatica, unspecified back pain laterality     Problem List Patient Active Problem List   Diagnosis Date Noted  . Type 2 diabetes mellitus without complication, without long-term current use of insulin ([place]) 02/22/2017  . Loss of transverse plantar arch 10/31/2016  . Pronation deformity of ankle, acquired 10/31/2016  . Patellofemoral pain syndrome 09/23/2015  . Diabetes mellitus, type II ([place]) 04/27/2015  . OSA (obstructive sleep apnea) 03/31/2014  . Asthma, chronic 08/14/2013  . Leg edema 08/14/2013  . Costochondritis 04/10/2013  . DEGENERATIVE JOINT DISEASE 09/11/2010  . SYNCOPE 07/12/2010  . VAGINITIS, BACTERIAL 06/28/2010  . GERD 03/17/2010  . PALPITATIONS 03/17/2010  . CHEST PAIN 03/17/2010  . ACUTE BRONCHITIS 01/23/2010  . CANDIDIASIS OF VULVA AND VAGINA 01/06/2010  . DEPRESSION, MODERATE, RECURRENT 01/06/2010  . LOW BACK PAIN 01/06/2010  . SCIATICA 08/30/2009  . TROCHANTERIC BURSITIS 08/09/2009  . ABDOMINAL PAIN RIGHT LOWER QUADRANT 01/12/2008  . MORTON'S NEUROMA 09/30/2007  . ETHMOID SINUSITIS 09/30/2007  . LEG PAIN, BILATERAL 09/30/2007  . Abdominal pain, other specified site 09/30/2007  . DEPRESSION 09/22/2007  . Essential hypertension 09/22/2007    Nely Dedmon, PTA 06/11/2018, 4:50 PM  [facility] [address], [place], [place], [address] Phone: [phone]   Fax:  [phone]  Name: [name] MRN: [id number] Date of Birth: 1961-07-23  [facility] [address] [place], [place], [address] Phone: [phone]   Fax:  [phone]  Physical Therapy Treatment  Patient Details  Name: [name] MRN: [id number] Date of Birth: 1961-06-07 Referring Provider: Laurey Morale, MD   Encounter Date: 06/11/2018  PT End of Session - 06/11/18 1612    Visit Number  2    Date for PT Re-Evaluation  08/05/18    PT Start Time  0932    PT Stop Time  1700    PT Time Calculation (min)  47 min    Activity Tolerance  Patient tolerated treatment well    Behavior During Therapy  [facility] for tasks assessed/performed       Past Medical History:  Diagnosis Date  . Allergy    Dr Caprice Red  . Cardiac arrhythmia   . Depression   . Diabetes mellitus without complication ([place])   . Fibroids    uterine sees Dr. Carlota Raspberry in [place]   . Genital warts   . Heart murmur   . Hypertension   . Irritable bowel syndrome (IBS)   . Low back pain    has a herniated lumbar disc sees Dr Marlou Sa    Past Surgical History:  Procedure Laterality Date  . ABDOMINOPLASTY  04/2013  . COLONOSCOPY  02-10-15   per Dr. Hilarie Fredrickson, clear, repeat in 10 yrs   . LUMBAR LAMINECTOMY     L3-4 on 02-24-10 per Dr Louanne Skye   . MYOMECTOMY     laser ablation of uterine fibroids per Dr Ouida Sills 2000  . TUBAL LIGATION      There were no vitals filed for this visit.  Subjective Assessment - 06/11/18 1628    Subjective  About the same, i was just here yesterday. I have headaches on & off today. My low back hurts today. Intermittent Rt shoulder pain.    Currently in Pain?  Yes    Pain Score  6     Pain Location  Back    Pain Orientation  Lower    Pain Descriptors / Indicators  Dull;Aching    Multiple Pain Sites  No                       [facility] Adult PT Treatment/Exercise - 06/11/18 0001      Moist Heat Therapy   Number Minutes Moist Heat  15 Minutes    Moist Heat Location  -- Cervical &  Lumbar      Electrical Stimulation   Electrical Stimulation Location  Lumbar    Electrical Stimulation Action  IFC    Electrical Stimulation Parameters  80-150 hZ hooklying    Electrical Stimulation Goals  Pain      Manual Therapy   Soft tissue mobilization  cervical, gentle bilateral Gentle shoulder PROM at [facility]             PT Education - 06/11/18 1642    Education Details  HEP progression for low back: single knee to chest, lower trunk rotation, TvA contraction for core initiation    Person(s) Educated  Patient    Methods  Explanation;Demonstration;Tactile cues;Verbal cues;Handout    Comprehension  Verbalized understanding;Returned demonstration       PT Short Term Goals - 06/10/18 1739      PT SHORT TERM GOAL #1   Title  ind with initial HEP    Time  4    Period  Weeks    Status

## 2018-06-16 ENCOUNTER — Ambulatory Visit: Payer: BC Managed Care – PPO | Attending: Family Medicine | Admitting: Physical Therapy

## 2018-06-16 ENCOUNTER — Encounter: Payer: Self-pay | Admitting: Physical Therapy

## 2018-06-16 DIAGNOSIS — M25511 Pain in right shoulder: Secondary | ICD-10-CM | POA: Diagnosis present

## 2018-06-16 DIAGNOSIS — M545 Low back pain, unspecified: Secondary | ICD-10-CM

## 2018-06-16 DIAGNOSIS — M542 Cervicalgia: Secondary | ICD-10-CM

## 2018-06-16 DIAGNOSIS — M62838 Other muscle spasm: Secondary | ICD-10-CM

## 2018-06-16 NOTE — Therapy (Signed)
[facility] [address], [place], [place], [address] Phone: [phone]   Fax:  [phone]  Physical Therapy Treatment  Patient Details  Name: [name] MRN: [id number] Date of Birth: [birth year] Referring Provider: Laurey Morale, MD   Encounter Date: 06/16/2018  PT End of Session - 06/16/18 1408    Visit Number  3    Date for PT Re-Evaluation  08/05/18    PT Start Time  1020    PT Stop Time  1100    PT Time Calculation (min)  40 min    Activity Tolerance  Patient tolerated treatment well    Behavior During Therapy  [facility] for tasks assessed/performed       Past Medical History:  Diagnosis Date  . Allergy    Dr Caprice Red  . Cardiac arrhythmia   . Depression   . Diabetes mellitus without complication ([place])   . Fibroids    uterine sees Dr. Carlota Raspberry in [place]   . Genital warts   . Heart murmur   . Hypertension   . Irritable bowel syndrome (IBS)   . Low back pain    has a herniated lumbar disc sees Dr Marlou Sa    Past Surgical History:  Procedure Laterality Date  . ABDOMINOPLASTY  04/2013  . COLONOSCOPY  02-10-15   per Dr. Hilarie Fredrickson, clear, repeat in 10 yrs   . LUMBAR LAMINECTOMY     L3-4 on 02-24-10 per Dr Louanne Skye   . MYOMECTOMY     laser ablation of uterine fibroids per Dr Ouida Sills 2000  . TUBAL LIGATION      There were no vitals filed for this visit.  Subjective Assessment - 06/16/18 1026    Subjective  My back was hurting the other day and I needed the cart or couldn't walk around the store.  neck and shoulder feel much better but intermittent very sharp.    Limitations  Walking;Lifting;House hold activities    How long can you walk comfortably?  1 mile (was walking 6 miles 3x/week)    Patient Stated Goals  return to being able to dressing, house chores, driving, walking    Currently in Pain?  Yes    Pain Score  7     Pain Location  Back    Pain Orientation  Lower    Pain Descriptors /  Indicators  Aching    Pain Type  Acute pain    Pain Frequency  Intermittent    Multiple Pain Sites  No                       [facility] Adult PT Treatment/Exercise - 06/16/18 0001      Moist Heat Therapy   Number Minutes Moist Heat  15 Minutes    Moist Heat Location  Cervical towel between to decrease heat; heat to lumbar as well      Electrical Stimulation   Electrical Stimulation Location  Lumbar    Electrical Stimulation Action  IFC    Electrical Stimulation Parameters  to tolerance; 15 min    Electrical Stimulation Goals  Pain      Manual Therapy   Soft tissue mobilization  suboccipitals, upper traps, cervical parapsinals, thoracic and lumbar multifidi       Trigger Point Dry Needling - 06/16/18 1052    Consent Given?  Yes    Education Handout Provided  Yes    Muscles Treated Upper Body  Upper trapezius suboccipitals,  [facility] [address], [place], [place], [address] Phone: [phone]   Fax:  [phone]  Physical Therapy Treatment  Patient Details  Name: [name] MRN: [id number] Date of Birth: [birth year] Referring Provider: Laurey Morale, MD   Encounter Date: 06/16/2018  PT End of Session - 06/16/18 1408    Visit Number  3    Date for PT Re-Evaluation  08/05/18    PT Start Time  1020    PT Stop Time  1100    PT Time Calculation (min)  40 min    Activity Tolerance  Patient tolerated treatment well    Behavior During Therapy  [facility] for tasks assessed/performed       Past Medical History:  Diagnosis Date  . Allergy    Dr Caprice Red  . Cardiac arrhythmia   . Depression   . Diabetes mellitus without complication ([place])   . Fibroids    uterine sees Dr. Carlota Raspberry in [place]   . Genital warts   . Heart murmur   . Hypertension   . Irritable bowel syndrome (IBS)   . Low back pain    has a herniated lumbar disc sees Dr Marlou Sa    Past Surgical History:  Procedure Laterality Date  . ABDOMINOPLASTY  04/2013  . COLONOSCOPY  02-10-15   per Dr. Hilarie Fredrickson, clear, repeat in 10 yrs   . LUMBAR LAMINECTOMY     L3-4 on 02-24-10 per Dr Louanne Skye   . MYOMECTOMY     laser ablation of uterine fibroids per Dr Ouida Sills 2000  . TUBAL LIGATION      There were no vitals filed for this visit.  Subjective Assessment - 06/16/18 1026    Subjective  My back was hurting the other day and I needed the cart or couldn't walk around the store.  neck and shoulder feel much better but intermittent very sharp.    Limitations  Walking;Lifting;House hold activities    How long can you walk comfortably?  1 mile (was walking 6 miles 3x/week)    Patient Stated Goals  return to being able to dressing, house chores, driving, walking    Currently in Pain?  Yes    Pain Score  7     Pain Location  Back    Pain Orientation  Lower    Pain Descriptors /  Indicators  Aching    Pain Type  Acute pain    Pain Frequency  Intermittent    Multiple Pain Sites  No                       [facility] Adult PT Treatment/Exercise - 06/16/18 0001      Moist Heat Therapy   Number Minutes Moist Heat  15 Minutes    Moist Heat Location  Cervical towel between to decrease heat; heat to lumbar as well      Electrical Stimulation   Electrical Stimulation Location  Lumbar    Electrical Stimulation Action  IFC    Electrical Stimulation Parameters  to tolerance; 15 min    Electrical Stimulation Goals  Pain      Manual Therapy   Soft tissue mobilization  suboccipitals, upper traps, cervical parapsinals, thoracic and lumbar multifidi       Trigger Point Dry Needling - 06/16/18 1052    Consent Given?  Yes    Education Handout Provided  Yes    Muscles Treated Upper Body  Upper trapezius suboccipitals,  cervical and upper traps, lumbar and cervical gentle ROM, shoulder ROM, f/u on TENS, estim, ice, modalities for pain management, cervical traction    PT Home Exercise Plan   Access Code: [email or web address]     Consulted and Agree with Plan of Care  Patient       Patient will benefit from skilled therapeutic intervention in order to improve the following deficits and impairments:  Abnormal gait, Pain, Increased fascial restricitons, Decreased strength, Decreased range of motion, Postural dysfunction, Increased muscle spasms, Impaired UE functional  use  Visit Diagnosis: Other muscle spasm  Acute pain of right shoulder  Cervicalgia  Acute low back pain without sciatica, unspecified back pain laterality     Problem List Patient Active Problem List   Diagnosis Date Noted  . Type 2 diabetes mellitus without complication, without long-term current use of insulin ([place]) 02/22/2017  . Loss of transverse plantar arch 10/31/2016  . Pronation deformity of ankle, acquired 10/31/2016  . Patellofemoral pain syndrome 09/23/2015  . Diabetes mellitus, type II ([place]) 04/27/2015  . OSA (obstructive sleep apnea) 03/31/2014  . Asthma, chronic 08/14/2013  . Leg edema 08/14/2013  . Costochondritis 04/10/2013  . DEGENERATIVE JOINT DISEASE 09/11/2010  . SYNCOPE 07/12/2010  . VAGINITIS, BACTERIAL 06/28/2010  . GERD 03/17/2010  . PALPITATIONS 03/17/2010  . CHEST PAIN 03/17/2010  . ACUTE BRONCHITIS 01/23/2010  . CANDIDIASIS OF VULVA AND VAGINA 01/06/2010  . DEPRESSION, MODERATE, RECURRENT 01/06/2010  . LOW BACK PAIN 01/06/2010  . SCIATICA 08/30/2009  . TROCHANTERIC BURSITIS 08/09/2009  . ABDOMINAL PAIN RIGHT LOWER QUADRANT 01/12/2008  . MORTON'S NEUROMA 09/30/2007  . ETHMOID SINUSITIS 09/30/2007  . LEG PAIN, BILATERAL 09/30/2007  . Abdominal pain, other specified site 09/30/2007  . DEPRESSION 09/22/2007  . Essential hypertension 09/22/2007    Zannie Cove, PT 06/16/2018, 3:02 PM  [facility] [address], [place], [place], [address] Phone: [phone]   Fax:  [phone]  Name: [name] MRN: [id number] Date of Birth: 10-29-1961

## 2018-06-18 ENCOUNTER — Encounter: Payer: Self-pay | Admitting: Physical Therapy

## 2018-06-18 ENCOUNTER — Ambulatory Visit: Payer: BC Managed Care – PPO | Admitting: Physical Therapy

## 2018-06-18 DIAGNOSIS — M62838 Other muscle spasm: Secondary | ICD-10-CM

## 2018-06-18 DIAGNOSIS — M545 Low back pain, unspecified: Secondary | ICD-10-CM

## 2018-06-18 DIAGNOSIS — M542 Cervicalgia: Secondary | ICD-10-CM

## 2018-06-18 DIAGNOSIS — M25511 Pain in right shoulder: Secondary | ICD-10-CM

## 2018-06-18 NOTE — Patient Instructions (Signed)
Access Code: [email or web address]  URL: [email or web address]  Date: 06/18/2018  Prepared by: Lovett Calender   Exercises  Seated Cervical Rotation AROM - 10 reps - 1 sets - 3x daily - 7x weekly  Standing Cervical Sidebending AROM - 10 reps - 1 sets - 3x daily - 7x weekly  Hooklying Single Knee to Chest - 3 reps - 3 sets - 20 hold - 2x daily - 7x weekly  Supine Lower Trunk Rotation - 3 reps - 3 sets - 10 hold - 2x daily - 7x weekly  Hooklying Transversus Abdominis Palpation - 5 reps - 1 sets - 3 hold - 5x daily - 7x weekly  Seated Passive Cervical Retraction - 10 reps - 3 sets - 1x daily - 7x weekly   [facility] [address], [place], [place] [address] Phone # [phone] Fax [phone]

## 2018-06-18 NOTE — Therapy (Signed)
[facility] [address] [place], [place], [address] Phone: [phone]   Fax:  [phone]  Physical Therapy Treatment  Patient Details  Name: [name] MRN: [id number] Date of Birth: [birth year] Referring Provider: Laurey Morale, MD   Encounter Date: 06/18/2018  PT End of Session - 06/18/18 1456    Visit Number  4    Date for PT Re-Evaluation  08/05/18    PT Start Time  1445    PT Stop Time  1536    PT Time Calculation (min)  51 min    Activity Tolerance  Patient tolerated treatment well    Behavior During Therapy  [facility] for tasks assessed/performed       Past Medical History:  Diagnosis Date  . Allergy    Dr Caprice Red  . Cardiac arrhythmia   . Depression   . Diabetes mellitus without complication ([place])   . Fibroids    uterine sees Dr. Carlota Raspberry in [place]   . Genital warts   . Heart murmur   . Hypertension   . Irritable bowel syndrome (IBS)   . Low back pain    has a herniated lumbar disc sees Dr Marlou Sa    Past Surgical History:  Procedure Laterality Date  . ABDOMINOPLASTY  04/2013  . COLONOSCOPY  02-10-15   per Dr. Hilarie Fredrickson, clear, repeat in 10 yrs   . LUMBAR LAMINECTOMY     L3-4 on 02-24-10 per Dr Louanne Skye   . MYOMECTOMY     laser ablation of uterine fibroids per Dr Ouida Sills 2000  . TUBAL LIGATION      There were no vitals filed for this visit.  Subjective Assessment - 06/18/18 1530    Subjective  My back is hurting more than neck.  I had some pain in my neck for the fist time in a while the other night.     Currently in Pain?  Yes    Pain Score  7     Pain Location  Back    Pain Orientation  Lower    Pain Descriptors / Indicators  Aching    Pain Type  Acute pain    Pain Onset  More than a month ago    Pain Frequency  Intermittent    Multiple Pain Sites  No                       [facility] Adult PT Treatment/Exercise - 06/18/18 0001      Lumbar Exercises: Stretches   Figure 4 Stretch  2 reps;30 seconds      Lumbar Exercises: Aerobic   Nustep  L2 x 6 min  PT present for status update      Lumbar Exercises: Standing   Row  Strengthening;Both;20 reps;Theraband    Theraband Level (Row)  Level 1 (Yellow)    Other Standing Lumbar Exercises  cervical retraction standing with ball behind head 5 x 5 sec      Moist Heat Therapy   Number Minutes Moist Heat  15 Minutes    Moist Heat Location  Cervical lumbar      Electrical Stimulation   Electrical Stimulation Location  Lumbar    Electrical Stimulation Action  IFC    Electrical Stimulation Parameters  to tolerance x 15 min    Electrical Stimulation Goals  Pain      Manual Therapy   Soft tissue mobilization  lumbar paraspinals, up to T10, glutes  Visit Diagnosis: Other muscle spasm  Acute pain of right shoulder  Cervicalgia  Acute low back pain without sciatica, unspecified back pain laterality     Problem List Patient Active Problem List   Diagnosis Date Noted  . Type 2 diabetes mellitus without complication, without long-term current use of insulin ([place]) 02/22/2017  . Loss of transverse plantar arch 10/31/2016  .  Pronation deformity of ankle, acquired 10/31/2016  . Patellofemoral pain syndrome 09/23/2015  . Diabetes mellitus, type II ([place]) 04/27/2015  . OSA (obstructive sleep apnea) 03/31/2014  . Asthma, chronic 08/14/2013  . Leg edema 08/14/2013  . Costochondritis 04/10/2013  . DEGENERATIVE JOINT DISEASE 09/11/2010  . SYNCOPE 07/12/2010  . VAGINITIS, BACTERIAL 06/28/2010  . GERD 03/17/2010  . PALPITATIONS 03/17/2010  . CHEST PAIN 03/17/2010  . ACUTE BRONCHITIS 01/23/2010  . CANDIDIASIS OF VULVA AND VAGINA 01/06/2010  . DEPRESSION, MODERATE, RECURRENT 01/06/2010  . LOW BACK PAIN 01/06/2010  . SCIATICA 08/30/2009  . TROCHANTERIC BURSITIS 08/09/2009  . ABDOMINAL PAIN RIGHT LOWER QUADRANT 01/12/2008  . MORTON'S NEUROMA 09/30/2007  . ETHMOID SINUSITIS 09/30/2007  . LEG PAIN, BILATERAL 09/30/2007  . Abdominal pain, other specified site 09/30/2007  . DEPRESSION 09/22/2007  . Essential hypertension 09/22/2007    Zannie Cove, PT 06/18/2018, 5:25 PM  [facility] [address] [place], [place], [address] Phone: [phone]   Fax:  [phone]  Name: TANGELIA SANSON MRN: [id number] Date of Birth: 10/24/1961  [facility] [address] [place], [place], [address] Phone: [phone]   Fax:  [phone]  Physical Therapy Treatment  Patient Details  Name: [name] MRN: [id number] Date of Birth: [birth year] Referring Provider: Laurey Morale, MD   Encounter Date: 06/18/2018  PT End of Session - 06/18/18 1456    Visit Number  4    Date for PT Re-Evaluation  08/05/18    PT Start Time  1445    PT Stop Time  1536    PT Time Calculation (min)  51 min    Activity Tolerance  Patient tolerated treatment well    Behavior During Therapy  [facility] for tasks assessed/performed       Past Medical History:  Diagnosis Date  . Allergy    Dr Caprice Red  . Cardiac arrhythmia   . Depression   . Diabetes mellitus without complication ([place])   . Fibroids    uterine sees Dr. Carlota Raspberry in [place]   . Genital warts   . Heart murmur   . Hypertension   . Irritable bowel syndrome (IBS)   . Low back pain    has a herniated lumbar disc sees Dr Marlou Sa    Past Surgical History:  Procedure Laterality Date  . ABDOMINOPLASTY  04/2013  . COLONOSCOPY  02-10-15   per Dr. Hilarie Fredrickson, clear, repeat in 10 yrs   . LUMBAR LAMINECTOMY     L3-4 on 02-24-10 per Dr Louanne Skye   . MYOMECTOMY     laser ablation of uterine fibroids per Dr Ouida Sills 2000  . TUBAL LIGATION      There were no vitals filed for this visit.  Subjective Assessment - 06/18/18 1530    Subjective  My back is hurting more than neck.  I had some pain in my neck for the fist time in a while the other night.     Currently in Pain?  Yes    Pain Score  7     Pain Location  Back    Pain Orientation  Lower    Pain Descriptors / Indicators  Aching    Pain Type  Acute pain    Pain Onset  More than a month ago    Pain Frequency  Intermittent    Multiple Pain Sites  No                       [facility] Adult PT Treatment/Exercise - 06/18/18 0001      Lumbar Exercises: Stretches   Figure 4 Stretch  2 reps;30 seconds      Lumbar Exercises: Aerobic   Nustep  L2 x 6 min  PT present for status update      Lumbar Exercises: Standing   Row  Strengthening;Both;20 reps;Theraband    Theraband Level (Row)  Level 1 (Yellow)    Other Standing Lumbar Exercises  cervical retraction standing with ball behind head 5 x 5 sec      Moist Heat Therapy   Number Minutes Moist Heat  15 Minutes    Moist Heat Location  Cervical lumbar      Electrical Stimulation   Electrical Stimulation Location  Lumbar    Electrical Stimulation Action  IFC    Electrical Stimulation Parameters  to tolerance x 15 min    Electrical Stimulation Goals  Pain      Manual Therapy   Soft tissue mobilization  lumbar paraspinals, up to T10, glutes

## 2018-06-23 ENCOUNTER — Encounter: Payer: Self-pay | Admitting: Family Medicine

## 2018-06-23 ENCOUNTER — Ambulatory Visit (INDEPENDENT_AMBULATORY_CARE_PROVIDER_SITE_OTHER): Payer: BC Managed Care – PPO | Admitting: Family Medicine

## 2018-06-23 ENCOUNTER — Ambulatory Visit: Payer: BC Managed Care – PPO | Admitting: Physical Therapy

## 2018-06-23 ENCOUNTER — Encounter: Payer: Self-pay | Admitting: Physical Therapy

## 2018-06-23 VITALS — BP 102/78 | HR 86 | Temp 98.2°F | Ht 64.0 in | Wt 185.0 lb

## 2018-06-23 DIAGNOSIS — S161XXD Strain of muscle, fascia and tendon at neck level, subsequent encounter: Secondary | ICD-10-CM | POA: Diagnosis not present

## 2018-06-23 DIAGNOSIS — R233 Spontaneous ecchymoses: Secondary | ICD-10-CM

## 2018-06-23 DIAGNOSIS — M542 Cervicalgia: Secondary | ICD-10-CM

## 2018-06-23 DIAGNOSIS — M62838 Other muscle spasm: Secondary | ICD-10-CM | POA: Diagnosis not present

## 2018-06-23 DIAGNOSIS — M545 Low back pain, unspecified: Secondary | ICD-10-CM

## 2018-06-23 DIAGNOSIS — R238 Other skin changes: Secondary | ICD-10-CM

## 2018-06-23 DIAGNOSIS — B009 Herpesviral infection, unspecified: Secondary | ICD-10-CM | POA: Diagnosis not present

## 2018-06-23 DIAGNOSIS — M25511 Pain in right shoulder: Secondary | ICD-10-CM

## 2018-06-23 MED ORDER — VALACYCLOVIR HCL 500 MG PO TABS: ORAL_TABLET | ORAL | 5 refills | Status: DC

## 2018-06-23 NOTE — Therapy (Signed)
[facility] [address], [place], [place], [address] Phone: [phone]   Fax:  [phone]  Physical Therapy Treatment  Patient Details  Name: [name] MRN: [id number] Date of Birth: [birth year] Referring Provider: Laurey Morale, MD   Encounter Date: 06/23/2018  PT End of Session - 06/23/18 1015    Visit Number  5    Date for PT Re-Evaluation  08/05/18    PT Start Time  2919    PT Stop Time  1045    PT Time Calculation (min)  30 min    Activity Tolerance  Patient tolerated treatment well    Behavior During Therapy  [facility] for tasks assessed/performed       Past Medical History:  Diagnosis Date  . Allergy    Dr Caprice Red  . Cardiac arrhythmia   . Depression   . Diabetes mellitus without complication ([place])   . Fibroids    uterine sees Dr. Carlota Raspberry in [place]   . Genital warts   . Heart murmur   . Hypertension   . Irritable bowel syndrome (IBS)   . Low back pain    has a herniated lumbar disc sees Dr Marlou Sa    Past Surgical History:  Procedure Laterality Date  . ABDOMINOPLASTY  04/2013  . COLONOSCOPY  02-10-15   per Dr. Hilarie Fredrickson, clear, repeat in 10 yrs   . LUMBAR LAMINECTOMY     L3-4 on 02-24-10 per Dr Louanne Skye   . MYOMECTOMY     laser ablation of uterine fibroids per Dr Ouida Sills 2000  . TUBAL LIGATION      There were no vitals filed for this visit.  Subjective Assessment - 06/23/18 1019    Subjective  I am doing much better after the dry needling.    Currently in Pain?  No/denies         [facility] PT Assessment - 06/23/18 0001      AROM   Right Shoulder Flexion  114 Degrees    Right Shoulder ABduction  143 Degrees    Left Shoulder Flexion  170 Degrees    Left Shoulder ABduction  146 Degrees    Cervical - Right Rotation  50 pain free    Cervical - Left Rotation  60 pain free                   [facility] Adult PT Treatment/Exercise - 06/23/18 0001      Shoulder Exercises:  ROM/Strengthening   Other ROM/Strengthening Exercises  UE ranger warm up - level 33 x10 each side      Electrical Stimulation   Electrical Stimulation Location  Lumbar    Electrical Stimulation Action  IFC    Electrical Stimulation Parameters  to tolerance x 7 min    Electrical Stimulation Goals  Pain      Manual Therapy   Soft tissue mobilization  lumbar paraspinals, up to T10       Trigger Point Dry Needling - 06/23/18 1028    Consent Given?  Yes    Muscles Treated Lower Body  -- Rt thoracic and lumbar multifidi T10-12; L1-3           PT Education - 06/23/18 1047    Education Details   Access Code: [email or web address] and educated on walking and stationary bike at the gym    Person(s) Educated  Patient    Methods  Explanation;Demonstration;Handout    Comprehension  Returned demonstration;Verbalized understanding  Type 2 diabetes mellitus without complication, without long-term current use of insulin ([place]) 02/22/2017  . Loss of transverse plantar arch 10/31/2016  . Pronation deformity of ankle, acquired 10/31/2016  . Patellofemoral pain syndrome 09/23/2015  . Diabetes  mellitus, type II ([place]) 04/27/2015  . OSA (obstructive sleep apnea) 03/31/2014  . Asthma, chronic 08/14/2013  . Leg edema 08/14/2013  . Costochondritis 04/10/2013  . DEGENERATIVE JOINT DISEASE 09/11/2010  . SYNCOPE 07/12/2010  . VAGINITIS, BACTERIAL 06/28/2010  . GERD 03/17/2010  . PALPITATIONS 03/17/2010  . CHEST PAIN 03/17/2010  . ACUTE BRONCHITIS 01/23/2010  . CANDIDIASIS OF VULVA AND VAGINA 01/06/2010  . DEPRESSION, MODERATE, RECURRENT 01/06/2010  . LOW BACK PAIN 01/06/2010  . SCIATICA 08/30/2009  . TROCHANTERIC BURSITIS 08/09/2009  . ABDOMINAL PAIN RIGHT LOWER QUADRANT 01/12/2008  . MORTON'S NEUROMA 09/30/2007  . ETHMOID SINUSITIS 09/30/2007  . LEG PAIN, BILATERAL 09/30/2007  . Abdominal pain, other specified site 09/30/2007  . DEPRESSION 09/22/2007  . Essential hypertension 09/22/2007    Zannie Cove, PT 06/23/2018, 10:51 AM  [facility] [address], [place], [place], [address] Phone: [phone]   Fax:  [phone]  Name: [name] MRN: [id number] Date of Birth: 02-18-1961  Type 2 diabetes mellitus without complication, without long-term current use of insulin ([place]) 02/22/2017  . Loss of transverse plantar arch 10/31/2016  . Pronation deformity of ankle, acquired 10/31/2016  . Patellofemoral pain syndrome 09/23/2015  . Diabetes  mellitus, type II ([place]) 04/27/2015  . OSA (obstructive sleep apnea) 03/31/2014  . Asthma, chronic 08/14/2013  . Leg edema 08/14/2013  . Costochondritis 04/10/2013  . DEGENERATIVE JOINT DISEASE 09/11/2010  . SYNCOPE 07/12/2010  . VAGINITIS, BACTERIAL 06/28/2010  . GERD 03/17/2010  . PALPITATIONS 03/17/2010  . CHEST PAIN 03/17/2010  . ACUTE BRONCHITIS 01/23/2010  . CANDIDIASIS OF VULVA AND VAGINA 01/06/2010  . DEPRESSION, MODERATE, RECURRENT 01/06/2010  . LOW BACK PAIN 01/06/2010  . SCIATICA 08/30/2009  . TROCHANTERIC BURSITIS 08/09/2009  . ABDOMINAL PAIN RIGHT LOWER QUADRANT 01/12/2008  . MORTON'S NEUROMA 09/30/2007  . ETHMOID SINUSITIS 09/30/2007  . LEG PAIN, BILATERAL 09/30/2007  . Abdominal pain, other specified site 09/30/2007  . DEPRESSION 09/22/2007  . Essential hypertension 09/22/2007    Zannie Cove, PT 06/23/2018, 10:51 AM  [facility] [address], [place], [place], [address] Phone: [phone]   Fax:  [phone]  Name: [name] MRN: [id number] Date of Birth: 02-18-1961

## 2018-06-23 NOTE — Patient Instructions (Signed)
Access Code: [email or web address]  URL: [email or web address]  Date: 06/23/2018  Prepared by: Lovett Calender   Exercises  Seated Cervical Rotation AROM - 10 reps - 1 sets - 3x daily - 7x weekly  Standing Cervical Sidebending AROM - 10 reps - 1 sets - 3x daily - 7x weekly  Hooklying Single Knee to Chest - 3 reps - 3 sets - 20 hold - 2x daily - 7x weekly  Supine Piriformis Stretch with Foot on Ground - 10 reps - 3 sets - 1x daily - 7x weekly  Supine Double Knee to Chest - 10 reps - 3 sets - 1x daily - 7x weekly  Supine Lower Trunk Rotation - 3 reps - 3 sets - 10 hold - 2x daily - 7x weekly  Hooklying Transversus Abdominis Palpation - 5 reps - 1 sets - 3 hold - 5x daily - 7x weekly  Seated Passive Cervical Retraction - 10 reps - 3 sets - 1x daily - 7x weekly  [facility] [address] [place], [place] [address] Phone # [phone] Fax [phone]
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
## 2018-06-24 LAB — CBC WITH DIFFERENTIAL/PLATELET
BASOS ABS: 0.1 10*3/uL (ref 0.0–0.1)
Basophils Relative: 1.3 % (ref 0.0–3.0)
EOS PCT: 2.2 % (ref 0.0–5.0)
Eosinophils Absolute: 0.2 10*3/uL (ref 0.0–0.7)
HCT: 41.7 % (ref 36.0–46.0)
HEMOGLOBIN: 14 g/dL (ref 12.0–15.0)
LYMPHS PCT: 30.9 % (ref 12.0–46.0)
Lymphs Abs: 3.2 10*3/uL (ref 0.7–4.0)
MCHC: 33.6 g/dL (ref 30.0–36.0)
MCV: 88.2 fl (ref 78.0–100.0)
Monocytes Absolute: 0.7 10*3/uL (ref 0.1–1.0)
Monocytes Relative: 7.1 % (ref 3.0–12.0)
Neutro Abs: 6.1 10*3/uL (ref 1.4–7.7)
Neutrophils Relative %: 58.5 % (ref 43.0–77.0)
Platelets: 388 10*3/uL (ref 150.0–400.0)
RBC: 4.73 Mil/uL (ref 3.87–5.11)
RDW: 14.1 % (ref 11.5–15.5)
WBC: 10.5 10*3/uL (ref 4.0–10.5)

## 2018-06-24 LAB — HEPATIC FUNCTION PANEL
ALBUMIN: 4.7 g/dL (ref 3.5–5.2)
ALT: 19 U/L (ref 0–35)
AST: 17 U/L (ref 0–37)
Alkaline Phosphatase: 150 U/L — ABNORMAL HIGH (ref 39–117)
Bilirubin, Direct: 0.1 mg/dL (ref 0.0–0.3)
TOTAL PROTEIN: 7.4 g/dL (ref 6.0–8.3)
Total Bilirubin: 0.7 mg/dL (ref 0.2–1.2)

## 2018-06-24 LAB — PROTIME-INR
INR: 1 ratio (ref 0.8–1.0)
PROTHROMBIN TIME: 11.6 s (ref 9.6–13.1)

## 2018-06-24 LAB — APTT: aPTT: 29 s (ref 23.4–32.7)

## 2018-06-25 ENCOUNTER — Encounter: Payer: Self-pay | Admitting: Physical Therapy

## 2018-06-25 ENCOUNTER — Ambulatory Visit: Payer: BC Managed Care – PPO | Admitting: Physical Therapy

## 2018-06-25 DIAGNOSIS — M62838 Other muscle spasm: Secondary | ICD-10-CM | POA: Diagnosis not present

## 2018-06-25 DIAGNOSIS — M545 Low back pain, unspecified: Secondary | ICD-10-CM

## 2018-06-25 DIAGNOSIS — M25511 Pain in right shoulder: Secondary | ICD-10-CM

## 2018-06-25 DIAGNOSIS — M542 Cervicalgia: Secondary | ICD-10-CM

## 2018-06-25 NOTE — Therapy (Signed)
Costochondritis 04/10/2013  . DEGENERATIVE JOINT DISEASE 09/11/2010  . SYNCOPE 07/12/2010  . VAGINITIS, BACTERIAL 06/28/2010  . GERD 03/17/2010  . PALPITATIONS 03/17/2010  . CHEST PAIN 03/17/2010  .  ACUTE BRONCHITIS 01/23/2010  . CANDIDIASIS OF VULVA AND VAGINA 01/06/2010  . DEPRESSION, MODERATE, RECURRENT 01/06/2010  . LOW BACK PAIN 01/06/2010  . SCIATICA 08/30/2009  . TROCHANTERIC BURSITIS 08/09/2009  . ABDOMINAL PAIN RIGHT LOWER QUADRANT 01/12/2008  . MORTON'S NEUROMA 09/30/2007  . ETHMOID SINUSITIS 09/30/2007  . LEG PAIN, BILATERAL 09/30/2007  . Abdominal pain, other specified site 09/30/2007  . DEPRESSION 09/22/2007  . Essential hypertension 09/22/2007    [name], PTA 06/25/2018, 4:08 PM  [facility] [address], [place], [place], [address] Phone: [phone]   Fax:  [phone]  Name: [name] MRN: [id number] Date of Birth: 1961-07-26  [facility] [address], [address] [place], [place], [address] Phone: [phone]   Fax:  [phone]  Physical Therapy Treatment  Patient Details  Name: [name] MRN: [id number] Date of Birth: 08/24/1961 Referring Provider: Fry, Stephen A, MD   Encounter Date: 06/25/2018  PT End of Session - 06/25/18 1529    Visit Number  6    Date for PT Re-Evaluation  08/05/18    PT Start Time  1530    PT Stop Time  1610    PT Time Calculation (min)  40 min    Activity Tolerance  Patient tolerated treatment well    Behavior During Therapy  WFL for tasks assessed/performed       Past Medical History:  Diagnosis Date  . Allergy    Dr Eugene [place]  . Cardiac arrhythmia   . Depression   . Diabetes mellitus without complication (HCC)   . Fibroids    uterine sees Dr. Greene in [place]   . Genital warts   . Heart murmur   . Hypertension   . Irritable bowel syndrome (IBS)   . Low back pain    has a herniated lumbar disc sees Dr Dean    Past Surgical History:  Procedure Laterality Date  . ABDOMINOPLASTY  04/2013  . COLONOSCOPY  02-10-15   per Dr. Pyrtle, clear, repeat in 10 yrs   . LUMBAR LAMINECTOMY     L3-4 on 02-24-10 per Dr Nitka   . MYOMECTOMY     laser ablation of uterine fibroids per Dr Anderson 2000  . TUBAL LIGATION      There were no vitals filed for this visit.  Subjective Assessment - 06/25/18 1531    Subjective  Repetative use of the shoulder increases pain, but overall much better. Dry needling felt very good.     Currently in Pain?  No/denies    Multiple Pain Sites  No                       [facility] Adult PT Treatment/Exercise - 06/25/18 0001      Lumbar Exercises: Stretches   Single Knee to Chest Stretch  Right;Left;3 reps;20 seconds    Lower Trunk Rotation  -- 3x20 sec bil    Figure 4 Stretch  3 reps;20 seconds;Supine      Lumbar Exercises: Aerobic   Nustep  L2 x 10 min PTA  present to monitor      Lumbar Exercises: Machines for Strengthening   Other Lumbar Machine Exercise  Seated chest press 1 plate 6x      Lumbar Exercises: Supine   Ab Set  -- TA contraction 10x    Pelvic Tilt  -- Ball squeeze/PPT/TA contraction6x    Bridge  Compliant;10 reps;2 seconds VC to slow speed      Shoulder Exercises: Seated   Other Seated Exercises  Cervical rotation and side bend 10x bil                PT Short Term Goals - 06/23/18 1047      PT SHORT TERM GOAL #3   Title  improved cervical rotation of at least 50 deg bilaterally for improved ability to drive    Status  Achieved      PT SHORT TERM GOAL #4   Title  able to perform shoulder AROM at least 120 degrees flexion and abduction for improved function    Baseline  Rt shoulder flexion improved   [facility] [address], [address] [place], [place], [address] Phone: [phone]   Fax:  [phone]  Physical Therapy Treatment  Patient Details  Name: [name] MRN: [id number] Date of Birth: 08/24/1961 Referring Provider: Fry, Stephen A, MD   Encounter Date: 06/25/2018  PT End of Session - 06/25/18 1529    Visit Number  6    Date for PT Re-Evaluation  08/05/18    PT Start Time  1530    PT Stop Time  1610    PT Time Calculation (min)  40 min    Activity Tolerance  Patient tolerated treatment well    Behavior During Therapy  WFL for tasks assessed/performed       Past Medical History:  Diagnosis Date  . Allergy    Dr Eugene [place]  . Cardiac arrhythmia   . Depression   . Diabetes mellitus without complication (HCC)   . Fibroids    uterine sees Dr. Greene in [place]   . Genital warts   . Heart murmur   . Hypertension   . Irritable bowel syndrome (IBS)   . Low back pain    has a herniated lumbar disc sees Dr Dean    Past Surgical History:  Procedure Laterality Date  . ABDOMINOPLASTY  04/2013  . COLONOSCOPY  02-10-15   per Dr. Pyrtle, clear, repeat in 10 yrs   . LUMBAR LAMINECTOMY     L3-4 on 02-24-10 per Dr Nitka   . MYOMECTOMY     laser ablation of uterine fibroids per Dr Anderson 2000  . TUBAL LIGATION      There were no vitals filed for this visit.  Subjective Assessment - 06/25/18 1531    Subjective  Repetative use of the shoulder increases pain, but overall much better. Dry needling felt very good.     Currently in Pain?  No/denies    Multiple Pain Sites  No                       [facility] Adult PT Treatment/Exercise - 06/25/18 0001      Lumbar Exercises: Stretches   Single Knee to Chest Stretch  Right;Left;3 reps;20 seconds    Lower Trunk Rotation  -- 3x20 sec bil    Figure 4 Stretch  3 reps;20 seconds;Supine      Lumbar Exercises: Aerobic   Nustep  L2 x 10 min PTA  present to monitor      Lumbar Exercises: Machines for Strengthening   Other Lumbar Machine Exercise  Seated chest press 1 plate 6x      Lumbar Exercises: Supine   Ab Set  -- TA contraction 10x    Pelvic Tilt  -- Ball squeeze/PPT/TA contraction6x    Bridge  Compliant;10 reps;2 seconds VC to slow speed      Shoulder Exercises: Seated   Other Seated Exercises  Cervical rotation and side bend 10x bil                PT Short Term Goals - 06/23/18 1047      PT SHORT TERM GOAL #3   Title  improved cervical rotation of at least 50 deg bilaterally for improved ability to drive    Status  Achieved      PT SHORT TERM GOAL #4   Title  able to perform shoulder AROM at least 120 degrees flexion and abduction for improved function    Baseline  Rt shoulder flexion improved

## 2018-06-27 ENCOUNTER — Ambulatory Visit: Payer: BC Managed Care – PPO | Admitting: Physical Therapy

## 2018-06-27 DIAGNOSIS — M25511 Pain in right shoulder: Secondary | ICD-10-CM

## 2018-06-27 DIAGNOSIS — M545 Low back pain, unspecified: Secondary | ICD-10-CM

## 2018-06-27 DIAGNOSIS — M62838 Other muscle spasm: Secondary | ICD-10-CM | POA: Diagnosis not present

## 2018-06-27 DIAGNOSIS — M542 Cervicalgia: Secondary | ICD-10-CM

## 2018-06-27 NOTE — Therapy (Signed)
[facility] [address] [place], [place], [address] Phone: [phone]   Fax:  [phone]  Physical Therapy Treatment  Patient Details  Name: [name] MRN: [id number] Date of Birth: Aug 07, 1961 Referring Provider: Nelwyn Salisbury, MD   Encounter Date: 06/27/2018  PT End of Session - 06/27/18 1313    Visit Number  7    Date for PT Re-Evaluation  08/05/18    PT Start Time  1028 pt arrived late    PT Stop Time  1058    PT Time Calculation (min)  30 min    Activity Tolerance  Patient tolerated treatment well    Behavior During Therapy  [facility] for tasks assessed/performed       Past Medical History:  Diagnosis Date  . Allergy    Dr Stevphen [place]  . Cardiac arrhythmia   . Depression   . Diabetes mellitus without complication (HCC)   . Fibroids    uterine sees Dr. Neva Seat in [place]   . Genital warts   . Heart murmur   . Hypertension   . Irritable bowel syndrome (IBS)   . Low back pain    has a herniated lumbar disc sees Dr August Saucer    Past Surgical History:  Procedure Laterality Date  . ABDOMINOPLASTY  04/2013  . COLONOSCOPY  02-10-15   per Dr. Rhea Belton, clear, repeat in 10 yrs   . LUMBAR LAMINECTOMY     L3-4 on 02-24-10 per Dr Otelia Sergeant   . MYOMECTOMY     laser ablation of uterine fibroids per Dr Dareen Piano 2000  . TUBAL LIGATION      There were no vitals filed for this visit.  Subjective Assessment - 06/27/18 1027    Subjective  My shoulder was more sore after last visit.  I had a little more back pain since doing more around the house.  I am able to use the bike and walk at the gym and it is feeling good where before I could not do anything like that.    Currently in Pain?  Yes    Pain Score  3     Pain Location  Shoulder    Pain Orientation  Right    Pain Descriptors / Indicators  Aching    Pain Type  Acute pain    Pain Onset  More than a month ago    Pain Frequency  Intermittent    Multiple Pain  Sites  No         [facility] PT Assessment - 06/27/18 0001      Strength   Right Shoulder Horizontal ABduction  4-/5    Right Shoulder Horizontal ADduction  4-/5    Left Shoulder Flexion  4-/5    Left Shoulder ABduction  4-/5    Right Hand Grip (lbs)  60                   [facility] Adult PT Treatment/Exercise - 06/27/18 0001      Lumbar Exercises: Aerobic   Nustep  L2 x 8 min PT present to monitor      Shoulder Exercises: Seated   Other Seated Exercises  W's with scap retract and depress - 5 sec hold x 5      Shoulder Exercises: Standing   Other Standing Exercises  finger ladder flexion and scaption - 5 x each with cues to stabilize scapula      Shoulder Exercises: Stretch   Other Shoulder Stretches  pec stretch in doorway      Manual Therapy   Soft tissue mobilization  anterior delt, upper trap, periscapula, pec minor, pec major               PT Short Term Goals - 06/23/18 1047      PT SHORT TERM GOAL #3   Title  improved cervical rotation of at least 50 deg bilaterally for improved ability to drive    Status  Achieved      PT SHORT TERM GOAL #4   Title  able to perform shoulder AROM at least 120 degrees flexion and abduction for improved function    Baseline  Rt shoulder flexion improved to 114 (all others achieved)    Status  Partially Met        PT Long Term Goals - 06/27/18 1318      PT LONG TERM GOAL #1   Title  ind with advanced HEP    Status  On-going      PT LONG TERM GOAL #2   Title  right grip strength increased to > or = to 45 lb in order to be able to open jars    Baseline  60 lb    Status  Achieved            Plan - 06/27/18 1313    Clinical Impression Statement  Pt has improved overall and able to return to some activities at the gym . She has met long term goal for grip strength and is able to open jars now.  Pt has shoulder pain with overhead movements.  She needs a lot of cues to stabilize her right scapula and has  winging of inferior angle.  Pt will benefit from skilled PT to continue to address muscle spasms and strength forimproved functional movements    PT Treatment/Interventions  ADLs/Self Care Home Management;Biofeedback;Cryotherapy;Electrical Stimulation;Moist Heat;Traction;Gait training;Therapeutic activities;Therapeutic exercise;Passive range of motion;Dry needling;Taping;Manual techniques;Neuromuscular re-education;Patient/family education    PT Next Visit Plan   ROM and strength, cervical and thoracic ROM, posture and core strengthening, DN to shoulder (lat, delt, upper trap, subscap) and lumbar    PT Home Exercise Plan   Access Code: [email or web address]    Consulted and Agree with Plan of Care  Patient       Patient will benefit from skilled therapeutic intervention in order to improve the following deficits and impairments:  Abnormal gait, Pain, Increased fascial restricitons, Decreased strength, Decreased range of motion, Postural dysfunction, Increased muscle spasms, Impaired UE functional use  Visit Diagnosis: No diagnosis found.     Problem List Patient Active Problem List   Diagnosis Date Noted  . 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 Type 2 diabetes mellitus without complication, without long-term current use of insulin (HCC) 02/22/2017  . Loss of transverse plantar arch 10/31/2016  . Pronation deformity of ankle, acquired 10/31/2016  . Patellofemoral pain syndrome 09/23/2015  . Diabetes mellitus, type II (HCC) 04/27/2015  . OSA (obstructive sleep apnea) 03/31/2014  . Asthma, chronic 08/14/2013  . Leg edema 08/14/2013  . Costochondritis 04/10/2013  . DEGENERATIVE JOINT DISEASE 09/11/2010  . SYNCOPE 07/12/2010  . VAGINITIS, BACTERIAL 06/28/2010  . GERD 03/17/2010  . PALPITATIONS 03/17/2010  . CHEST PAIN 03/17/2010  . ACUTE BRONCHITIS 01/23/2010  . CANDIDIASIS OF VULVA AND VAGINA 01/06/2010  . DEPRESSION, MODERATE, RECURRENT 01/06/2010  . LOW BACK PAIN 01/06/2010  . SCIATICA  08/30/2009  . TROCHANTERIC BURSITIS 08/09/2009  . ABDOMINAL PAIN RIGHT LOWER QUADRANT 01/12/2008  .  MORTON'S NEUROMA 09/30/2007  . ETHMOID SINUSITIS 09/30/2007  . LEG PAIN, BILATERAL 09/30/2007  . Abdominal pain, other specified site 09/30/2007  . DEPRESSION 09/22/2007  . Essential hypertension 09/22/2007    Vincente Poli, PT 06/27/2018, 1:18 PM  [facility] [address] [place], [place], [address] Phone: [phone]   Fax:  [phone]  Name: SARESA CAPUCHINO MRN: [id number] Date of Birth: [birth year]

## 2018-06-30 ENCOUNTER — Encounter: Payer: Self-pay | Admitting: Physical Therapy

## 2018-06-30 ENCOUNTER — Ambulatory Visit: Payer: BC Managed Care – PPO | Admitting: Physical Therapy

## 2018-06-30 DIAGNOSIS — M545 Low back pain, unspecified: Secondary | ICD-10-CM

## 2018-06-30 DIAGNOSIS — M62838 Other muscle spasm: Secondary | ICD-10-CM

## 2018-06-30 DIAGNOSIS — M25511 Pain in right shoulder: Secondary | ICD-10-CM

## 2018-06-30 DIAGNOSIS — M542 Cervicalgia: Secondary | ICD-10-CM

## 2018-06-30 NOTE — Patient Instructions (Signed)
Access Code: [email or web address]  URL: [email or web address]  Date: 06/30/2018  Prepared by: Lovett Calender   Exercises  Seated Cervical Rotation AROM - 10 reps - 1 sets - 3x daily - 7x weekly  Standing Cervical Sidebending AROM - 10 reps - 1 sets - 3x daily - 7x weekly  Hooklying Single Knee to Chest - 3 reps - 3 sets - 20 hold - 2x daily - 7x weekly  Supine Piriformis Stretch with Foot on Ground - 10 reps - 3 sets - 1x daily - 7x weekly  Supine Double Knee to Chest - 10 reps - 3 sets - 1x daily - 7x weekly  Supine Lower Trunk Rotation - 3 reps - 3 sets - 10 hold - 2x daily - 7x weekly  Hooklying Transversus Abdominis Palpation - 5 reps - 1 sets - 3 hold - 5x daily - 7x weekly  Seated Passive Cervical Retraction - 10 reps - 3 sets - 1x daily - 7x weekly  Shoulder Extension with Resistance - Neutral - 10 reps - 3 sets - 1x daily - 7x weekly  Scapular Retraction with Resistance - 10 reps - 3 sets - 1x daily - 7x weekly   Zannie Cove, PT 06/30/18 9:01 AM

## 2018-06-30 NOTE — Therapy (Signed)
CANDIDIASIS OF VULVA AND VAGINA 01/06/2010  . DEPRESSION, MODERATE, RECURRENT 01/06/2010  . LOW BACK PAIN 01/06/2010  . SCIATICA 08/30/2009  . TROCHANTERIC BURSITIS 08/09/2009  . ABDOMINAL PAIN RIGHT LOWER QUADRANT 01/12/2008  . MORTON'S NEUROMA 09/30/2007  . ETHMOID SINUSITIS 09/30/2007  . LEG PAIN, BILATERAL 09/30/2007  . Abdominal pain, other specified site 09/30/2007  . DEPRESSION 09/22/2007  . Essential hypertension 09/22/2007    Zannie Cove, PT 06/30/2018, 9:11 AM  [facility] [address], [place], [place], [address] Phone: [phone]   Fax:  [phone]  Name: [name] MRN: [id number] Date of Birth: [birth year]  CANDIDIASIS OF VULVA AND VAGINA 01/06/2010  . DEPRESSION, MODERATE, RECURRENT 01/06/2010  . LOW BACK PAIN 01/06/2010  . SCIATICA 08/30/2009  . TROCHANTERIC BURSITIS 08/09/2009  . ABDOMINAL PAIN RIGHT LOWER QUADRANT 01/12/2008  . MORTON'S NEUROMA 09/30/2007  . ETHMOID SINUSITIS 09/30/2007  . LEG PAIN, BILATERAL 09/30/2007  . Abdominal pain, other specified site 09/30/2007  . DEPRESSION 09/22/2007  . Essential hypertension 09/22/2007    Zannie Cove, PT 06/30/2018, 9:11 AM  [facility] [address], [place], [place], [address] Phone: [phone]   Fax:  [phone]  Name: [name] MRN: [id number] Date of Birth: [birth year]  [facility] [address] [place], [place], [address] Phone: [phone]   Fax:  [phone]  Physical Therapy Treatment  Patient Details  Name: [name] MRN: [id number] Date of Birth: [birth year] Referring Provider: Laurey Morale, MD   Encounter Date: 06/30/2018  PT End of Session - 06/30/18 0814    Visit Number  8    Date for PT Re-Evaluation  08/05/18    PT Start Time  0809 pt arrived late    PT Stop Time  0854    PT Time Calculation (min)  45 min    Activity Tolerance  Patient tolerated treatment well    Behavior During Therapy  [facility] for tasks assessed/performed       Past Medical History:  Diagnosis Date  . Allergy    Dr Caprice Red  . Cardiac arrhythmia   . Depression   . Diabetes mellitus without complication ([place])   . Fibroids    uterine sees Dr. Carlota Raspberry in [place]   . Genital warts   . Heart murmur   . Hypertension   . Irritable bowel syndrome (IBS)   . Low back pain    has a herniated lumbar disc sees Dr Marlou Sa    Past Surgical History:  Procedure Laterality Date  . ABDOMINOPLASTY  04/2013  . COLONOSCOPY  02-10-15   per Dr. Hilarie Fredrickson, clear, repeat in 10 yrs   . LUMBAR LAMINECTOMY     L3-4 on 02-24-10 per Dr Louanne Skye   . MYOMECTOMY     laser ablation of uterine fibroids per Dr Ouida Sills 2000  . TUBAL LIGATION      There were no vitals filed for this visit.  Subjective Assessment - 06/30/18 0811    Subjective  I was more sore this weekend when I was walking more.  My back and shoulder on the right side were on and off hurting more.  I was able to walk a mile    Currently in Pain?  Yes    Pain Score  4     Pain Location  Back    Pain Orientation  Right;Lower    Pain Descriptors / Indicators  Aching    Pain Type  Acute pain    Pain Onset  More than a month ago    Aggravating Factors   walking more    Multiple Pain Sites  No                       [facility] Adult PT  Treatment/Exercise - 06/30/18 0001      Lumbar Exercises: Aerobic   UBE (Upper Arm Bike)  L1 3x3 in standing cues for posture      Lumbar Exercises: Standing   Row  Strengthening;Both;20 reps;Theraband    Theraband Level (Row)  Level 2 (Red)    Shoulder Extension  Strengthening;Both;20 reps;Theraband    Theraband Level (Shoulder Extension)  Level 2 (Red)      Moist Heat Therapy   Number Minutes Moist Heat  5 Minutes    Moist Heat Location  Lumbar Spine      Manual Therapy   Soft tissue mobilization  lumbar and throacic paraspinals, right infraspinatus, rhomboids, upper trap       Trigger Point Dry Needling - 06/30/18 0906    Consent Given?  Yes    Muscles Treated Upper Body  Upper trapezius;Infraspinatus lumbar multifidi    Upper Trapezius Response  Twitch reponse elicited;Palpable increased muscle length

## 2018-07-04 ENCOUNTER — Ambulatory Visit: Payer: BC Managed Care – PPO | Admitting: Physical Therapy

## 2018-07-09 ENCOUNTER — Encounter: Payer: Self-pay | Admitting: Physical Therapy

## 2018-07-09 ENCOUNTER — Ambulatory Visit: Payer: BC Managed Care – PPO | Admitting: Physical Therapy

## 2018-07-09 DIAGNOSIS — M545 Low back pain, unspecified: Secondary | ICD-10-CM

## 2018-07-09 DIAGNOSIS — M62838 Other muscle spasm: Secondary | ICD-10-CM | POA: Diagnosis not present

## 2018-07-09 DIAGNOSIS — M25511 Pain in right shoulder: Secondary | ICD-10-CM

## 2018-07-09 DIAGNOSIS — M542 Cervicalgia: Secondary | ICD-10-CM

## 2018-07-09 NOTE — Therapy (Signed)
[facility] [address] [place], [place], [address] Phone: [phone]   Fax:  [phone]  Physical Therapy Treatment  Patient Details  Name: [name] MRN: [id number] Date of Birth: 1961-01-14 Referring Provider: Laurey Morale, MD   Encounter Date: 07/09/2018  PT End of Session - 07/09/18 0946    Visit Number  9    Date for PT Re-Evaluation  08/05/18    PT Start Time  0939 late    PT Stop Time  1012    PT Time Calculation (min)  33 min    Activity Tolerance  Patient tolerated treatment well    Behavior During Therapy  [facility] for tasks assessed/performed       Past Medical History:  Diagnosis Date  . Allergy    Dr Caprice Red  . Cardiac arrhythmia   . Depression   . Diabetes mellitus without complication ([place])   . Fibroids    uterine sees Dr. Carlota Raspberry in [place]   . Genital warts   . Heart murmur   . Hypertension   . Irritable bowel syndrome (IBS)   . Low back pain    has a herniated lumbar disc sees Dr Marlou Sa    Past Surgical History:  Procedure Laterality Date  . ABDOMINOPLASTY  04/2013  . COLONOSCOPY  02-10-15   per Dr. Hilarie Fredrickson, clear, repeat in 10 yrs   . LUMBAR LAMINECTOMY     L3-4 on 02-24-10 per Dr Louanne Skye   . MYOMECTOMY     laser ablation of uterine fibroids per Dr Ouida Sills 2000  . TUBAL LIGATION      There were no vitals filed for this visit.  Subjective Assessment - 07/09/18 0944    Subjective  I was so sore after traveling to [facility].  I did a lot of walking.      Currently in Pain?  Yes    Pain Score  4     Pain Location  Shoulder    Pain Orientation  Right    Pain Descriptors / Indicators  Aching    Pain Type  Acute pain    Pain Radiating Towards  shoulder to bicep    Pain Onset  More than a month ago    Pain Frequency  Intermittent    Multiple Pain Sites  No                       [facility] Adult PT Treatment/Exercise - 07/09/18 0001      Neck Exercises:  Supine   Other Supine Exercise  soft foam roll behind neck - nodding and rotation      Lumbar Exercises: Aerobic   Nustep  L2 x 8 min PT present to monitor      Lumbar Exercises: Supine   Other Supine Lumbar Exercises  lying on foam roller      Shoulder Exercises: Standing   Other Standing Exercises  finger ladder flexion and scaption - 5 x each with cues to stabilize scapula    Other Standing Exercises  UE ranger level 30 flexion and scaption 2x 10               PT Short Term Goals - 06/30/18 3428      PT SHORT TERM GOAL #2   Title  pt will be able to bring 1-2 lb down from a shelf at shoulder height using her right UE.    Status  Achieved  PT Long Term Goals - 06/30/18 0857      PT LONG TERM GOAL #1   Title  ind with advanced HEP    Status  On-going      PT LONG TERM GOAL #3   Title  pt able to move 3lb objects up and down from an overhead shelf without increased pain or muscle spasm    Baseline  able to reach, but doing too much can increase pain    Status  On-going      PT LONG TERM GOAL #4   Title  FOTO < or = to 38% limited    Status  On-going      PT LONG TERM GOAL #5   Title  pt will increase right shoulder IR to L3 for improved ability to get dressed    Status  On-going            Plan - 07/09/18 0947    Clinical Impression Statement  Pt continues to have difficulty with right shoulder flexion.  She needs cues for smaller ROM and to reduce upper trap activation  Pt tolerated treatments well and no increased pain with cues.  Pt had decreased pain with foam roll behind neck and was able to demonstrate improved cervical rotation     PT Treatment/Interventions  ADLs/Self Care Home Management;Biofeedback;Cryotherapy;Electrical Stimulation;Moist Heat;Traction;Gait training;Therapeutic activities;Therapeutic exercise;Passive range of motion;Dry needling;Taping;Manual techniques;Neuromuscular re-education;Patient/family education    Recommended  Other Services  eval 6/25 order signed    Consulted and Agree with Plan of Care  Patient       Patient will benefit from skilled therapeutic intervention in order to improve the following deficits and impairments:  Abnormal gait, Pain, Increased fascial restricitons, Decreased strength, Decreased range of motion, Postural dysfunction, Increased muscle spasms, Impaired UE functional use  Visit Diagnosis: Other muscle spasm  Acute pain of right shoulder  Cervicalgia  Acute low back pain without sciatica, unspecified back pain laterality     Problem List Patient Active Problem List   Diagnosis Date Noted  . 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 Type 2 diabetes mellitus without complication, without long-term current use of insulin ([place]) 02/22/2017  . Loss of transverse plantar arch 10/31/2016  . Pronation deformity of ankle, acquired 10/31/2016  . Patellofemoral pain syndrome 09/23/2015  . Diabetes mellitus, type II ([place]) 04/27/2015  . OSA (obstructive sleep apnea) 03/31/2014  . Asthma, chronic 08/14/2013  . Leg edema 08/14/2013  . Costochondritis 04/10/2013  . DEGENERATIVE JOINT DISEASE 09/11/2010  . SYNCOPE 07/12/2010  . VAGINITIS, BACTERIAL 06/28/2010  . GERD 03/17/2010  . PALPITATIONS 03/17/2010  . CHEST PAIN 03/17/2010  . ACUTE BRONCHITIS 01/23/2010  . CANDIDIASIS OF VULVA AND VAGINA 01/06/2010  . DEPRESSION, MODERATE, RECURRENT 01/06/2010  . LOW BACK PAIN 01/06/2010  . SCIATICA 08/30/2009  . TROCHANTERIC BURSITIS 08/09/2009  . ABDOMINAL PAIN RIGHT LOWER QUADRANT 01/12/2008  . MORTON'S NEUROMA 09/30/2007  . ETHMOID SINUSITIS 09/30/2007  . LEG PAIN, BILATERAL 09/30/2007  . Abdominal pain, other specified site 09/30/2007  . DEPRESSION 09/22/2007  . Essential hypertension 09/22/2007    Zannie Cove 07/09/2018, 10:31 AM  [facility] [address] [place], [place], [address] Phone: [phone]    Fax:  [phone]  Name: [name] MRN: [id number] Date of Birth: [birth year]

## 2018-07-14 ENCOUNTER — Encounter: Payer: Self-pay | Admitting: Physical Therapy

## 2018-07-14 ENCOUNTER — Ambulatory Visit: Payer: BC Managed Care – PPO | Admitting: Physical Therapy

## 2018-07-14 DIAGNOSIS — M62838 Other muscle spasm: Secondary | ICD-10-CM | POA: Diagnosis not present

## 2018-07-14 DIAGNOSIS — M545 Low back pain, unspecified: Secondary | ICD-10-CM

## 2018-07-14 DIAGNOSIS — M542 Cervicalgia: Secondary | ICD-10-CM

## 2018-07-14 DIAGNOSIS — M25511 Pain in right shoulder: Secondary | ICD-10-CM

## 2018-07-14 NOTE — Therapy (Signed)
09/22/2007  . Essential hypertension 09/22/2007    Zannie Cove, PT 07/14/2018, 9:29 AM  [facility] [address] [place], [place], [address] Phone: [phone]   Fax:  [phone]  Name: [name] MRN: [id number] Date of Birth: 08-02-1961  [facility] [address] [place], [place], [address] Phone: [phone]   Fax:  [phone]  Physical Therapy Treatment  Patient Details  Name: [name] MRN: [id number] Date of Birth: 09-23-1961 Referring Provider: Laurey Morale, MD   Encounter Date: 07/14/2018  PT End of Session - 07/14/18 0901    Visit Number  10    Date for PT Re-Evaluation  08/05/18    PT Start Time  0846    PT Stop Time  0927    PT Time Calculation (min)  41 min    Activity Tolerance  Patient tolerated treatment well    Behavior During Therapy  [facility] for tasks assessed/performed       Past Medical History:  Diagnosis Date  . Allergy    Dr Caprice Red  . Cardiac arrhythmia   . Depression   . Diabetes mellitus without complication ([place])   . Fibroids    uterine sees Dr. Carlota Raspberry in [place]   . Genital warts   . Heart murmur   . Hypertension   . Irritable bowel syndrome (IBS)   . Low back pain    has a herniated lumbar disc sees Dr Marlou Sa    Past Surgical History:  Procedure Laterality Date  . ABDOMINOPLASTY  04/2013  . COLONOSCOPY  02-10-15   per Dr. Hilarie Fredrickson, clear, repeat in 10 yrs   . LUMBAR LAMINECTOMY     L3-4 on 02-24-10 per Dr Louanne Skye   . MYOMECTOMY     laser ablation of uterine fibroids per Dr Ouida Sills 2000  . TUBAL LIGATION      There were no vitals filed for this visit.  Subjective Assessment - 07/14/18 0853    Subjective  I felt a lot better this weekend.  Not feeling anything right now.  I was doing some bending down and picking up this weekend and didn't feel my back.  I was feeling the muscle spasms in my neck.    Limitations  Walking;Lifting;House hold activities    Patient Stated Goals  return to being able to dressing, house chores, driving, walking    Currently in Pain?  No/denies                       [facility] Adult PT Treatment/Exercise - 07/14/18 0001      Neck Exercises: Supine   Neck  Retraction  10 reps;5 secs    Other Supine Exercise  cervical rotation in sitting with towel - 3 x 10 sec; pec stretch in doorway 3 x 20 sec    Other Supine Exercise  foam roll rotation - 5 x each side discontinued due to painful today      Lumbar Exercises: Aerobic   UBE (Upper Arm Bike)  L3 3x3 in PT present for status update      Shoulder Exercises: Supine   Horizontal ABduction  Strengthening;Both;20 reps;Theraband    Theraband Level (Shoulder Horizontal ABduction)  Level 2 (Red)    External Rotation  Strengthening;Both;20 reps;Theraband    Theraband Level (Shoulder External Rotation)  Level 2 (Red)    Diagonals  Strengthening;Both;20 reps;Theraband    Theraband Level (Shoulder Diagonals)  Level 1 (Yellow)      Shoulder Exercises: Seated   Other Seated Exercises  upper trap stretch - 5 x 10 sec each side      Shoulder Exercises: Standing   Other Standing Exercises  UE ranger level 30 flexion 2x 10  09/22/2007  . Essential hypertension 09/22/2007    Zannie Cove, PT 07/14/2018, 9:29 AM  [facility] [address] [place], [place], [address] Phone: [phone]   Fax:  [phone]  Name: [name] MRN: [id number] Date of Birth: 08-02-1961

## 2018-07-16 ENCOUNTER — Encounter: Payer: Self-pay | Admitting: Physical Therapy

## 2018-07-16 ENCOUNTER — Ambulatory Visit: Payer: BC Managed Care – PPO | Admitting: Physical Therapy

## 2018-07-16 DIAGNOSIS — M25511 Pain in right shoulder: Secondary | ICD-10-CM

## 2018-07-16 DIAGNOSIS — M542 Cervicalgia: Secondary | ICD-10-CM

## 2018-07-16 DIAGNOSIS — M545 Low back pain, unspecified: Secondary | ICD-10-CM

## 2018-07-16 DIAGNOSIS — M62838 Other muscle spasm: Secondary | ICD-10-CM | POA: Diagnosis not present

## 2018-07-16 NOTE — Therapy (Signed)
[facility] [address], [place], [place], [address] Phone: [phone]   Fax:  [phone]  Physical Therapy Treatment  Patient Details  Name: [name] MRN: [id number] Date of Birth: September 30, 1961 Referring Provider: Laurey Morale, MD   Encounter Date: 07/16/2018  PT End of Session - 07/16/18 0807    Visit Number  11    Date for PT Re-Evaluation  08/05/18    PT Start Time  0801    PT Stop Time  0843    PT Time Calculation (min)  42 min    Activity Tolerance  Patient tolerated treatment well    Behavior During Therapy  [facility] for tasks assessed/performed       Past Medical History:  Diagnosis Date  . Allergy    Dr Caprice Red  . Cardiac arrhythmia   . Depression   . Diabetes mellitus without complication ([place])   . Fibroids    uterine sees Dr. Carlota Raspberry in [place]   . Genital warts   . Heart murmur   . Hypertension   . Irritable bowel syndrome (IBS)   . Low back pain    has a herniated lumbar disc sees Dr Marlou Sa    Past Surgical History:  Procedure Laterality Date  . ABDOMINOPLASTY  04/2013  . COLONOSCOPY  02-10-15   per Dr. Hilarie Fredrickson, clear, repeat in 10 yrs   . LUMBAR LAMINECTOMY     L3-4 on 02-24-10 per Dr Louanne Skye   . MYOMECTOMY     laser ablation of uterine fibroids per Dr Ouida Sills 2000  . TUBAL LIGATION      There were no vitals filed for this visit.  Subjective Assessment - 07/16/18 0804    Subjective  My arm still hurts but it's dull.  I feel it anytime I move it.      Currently in Pain?  Yes    Pain Score  5     Pain Location  Arm    Pain Orientation  Right    Pain Descriptors / Indicators  Dull    Pain Type  Acute pain    Pain Radiating Towards  medial upper arm    Pain Onset  More than a month ago    Pain Frequency  Intermittent    Aggravating Factors   anytime I move the arm     Pain Relieving Factors  resting    Multiple Pain Sites  No                       [facility]  Adult PT Treatment/Exercise - 07/16/18 0001      Lumbar Exercises: Aerobic   UBE (Upper Arm Bike)  L3 3x3 in PT present for status update      Shoulder Exercises: Seated   Other Seated Exercises  upper trap stretch - 5 x 10 sec each side      Shoulder Exercises: Standing   Diagonals  Strengthening;Right;20 reps;Theraband;Limitations needs cues for decreased upper trap activation    Theraband Level (Shoulder Diagonals)  Level 1 (Yellow)    Other Standing Exercises  UE ranger level 30 flexion 2x 10      Shoulder Exercises: ROM/Strengthening   Other ROM/Strengthening Exercises  UE ranger - flexion and abduction level 33 - 20x    Other ROM/Strengthening Exercises  shoulder IR stretch      Shoulder Exercises: Stretch   Internal Rotation Stretch  5 reps x 10 sec with towel  sleep apnea) 03/31/2014  . Asthma, chronic 08/14/2013  . Leg edema 08/14/2013  . Costochondritis 04/10/2013  . DEGENERATIVE JOINT DISEASE 09/11/2010  . SYNCOPE 07/12/2010  . VAGINITIS, BACTERIAL 06/28/2010  . GERD 03/17/2010  . PALPITATIONS 03/17/2010  . CHEST PAIN 03/17/2010  . ACUTE BRONCHITIS 01/23/2010  . CANDIDIASIS OF VULVA AND VAGINA 01/06/2010  . DEPRESSION, MODERATE, RECURRENT 01/06/2010  . LOW BACK PAIN 01/06/2010  . SCIATICA 08/30/2009  . TROCHANTERIC BURSITIS 08/09/2009  . ABDOMINAL PAIN RIGHT LOWER QUADRANT 01/12/2008  . MORTON'S NEUROMA 09/30/2007  . ETHMOID SINUSITIS 09/30/2007  . LEG PAIN, BILATERAL 09/30/2007  . Abdominal pain, other specified site 09/30/2007  . DEPRESSION 09/22/2007  . Essential hypertension 09/22/2007    Zannie Cove, PT 07/16/2018, 10:05 AM  [facility] [address] [place], [place], [address] Phone: [phone]   Fax:  [phone]  Name: [name] MRN: [id number] Date of Birth: Dec 19, 1960  [facility] [address], [place], [place], [address] Phone: [phone]   Fax:  [phone]  Physical Therapy Treatment  Patient Details  Name: [name] MRN: [id number] Date of Birth: September 30, 1961 Referring Provider: Laurey Morale, MD   Encounter Date: 07/16/2018  PT End of Session - 07/16/18 0807    Visit Number  11    Date for PT Re-Evaluation  08/05/18    PT Start Time  0801    PT Stop Time  0843    PT Time Calculation (min)  42 min    Activity Tolerance  Patient tolerated treatment well    Behavior During Therapy  [facility] for tasks assessed/performed       Past Medical History:  Diagnosis Date  . Allergy    Dr Caprice Red  . Cardiac arrhythmia   . Depression   . Diabetes mellitus without complication ([place])   . Fibroids    uterine sees Dr. Carlota Raspberry in [place]   . Genital warts   . Heart murmur   . Hypertension   . Irritable bowel syndrome (IBS)   . Low back pain    has a herniated lumbar disc sees Dr Marlou Sa    Past Surgical History:  Procedure Laterality Date  . ABDOMINOPLASTY  04/2013  . COLONOSCOPY  02-10-15   per Dr. Hilarie Fredrickson, clear, repeat in 10 yrs   . LUMBAR LAMINECTOMY     L3-4 on 02-24-10 per Dr Louanne Skye   . MYOMECTOMY     laser ablation of uterine fibroids per Dr Ouida Sills 2000  . TUBAL LIGATION      There were no vitals filed for this visit.  Subjective Assessment - 07/16/18 0804    Subjective  My arm still hurts but it's dull.  I feel it anytime I move it.      Currently in Pain?  Yes    Pain Score  5     Pain Location  Arm    Pain Orientation  Right    Pain Descriptors / Indicators  Dull    Pain Type  Acute pain    Pain Radiating Towards  medial upper arm    Pain Onset  More than a month ago    Pain Frequency  Intermittent    Aggravating Factors   anytime I move the arm     Pain Relieving Factors  resting    Multiple Pain Sites  No                       [facility]  Adult PT Treatment/Exercise - 07/16/18 0001      Lumbar Exercises: Aerobic   UBE (Upper Arm Bike)  L3 3x3 in PT present for status update      Shoulder Exercises: Seated   Other Seated Exercises  upper trap stretch - 5 x 10 sec each side      Shoulder Exercises: Standing   Diagonals  Strengthening;Right;20 reps;Theraband;Limitations needs cues for decreased upper trap activation    Theraband Level (Shoulder Diagonals)  Level 1 (Yellow)    Other Standing Exercises  UE ranger level 30 flexion 2x 10      Shoulder Exercises: ROM/Strengthening   Other ROM/Strengthening Exercises  UE ranger - flexion and abduction level 33 - 20x    Other ROM/Strengthening Exercises  shoulder IR stretch      Shoulder Exercises: Stretch   Internal Rotation Stretch  5 reps x 10 sec with towel

## 2018-07-21 ENCOUNTER — Encounter: Payer: Self-pay | Admitting: Physical Therapy

## 2018-07-21 ENCOUNTER — Ambulatory Visit: Payer: BC Managed Care – PPO | Attending: Family Medicine | Admitting: Physical Therapy

## 2018-07-21 DIAGNOSIS — M62838 Other muscle spasm: Secondary | ICD-10-CM | POA: Diagnosis present

## 2018-07-21 DIAGNOSIS — M25511 Pain in right shoulder: Secondary | ICD-10-CM | POA: Insufficient documentation

## 2018-07-21 DIAGNOSIS — M542 Cervicalgia: Secondary | ICD-10-CM

## 2018-07-21 DIAGNOSIS — M545 Low back pain, unspecified: Secondary | ICD-10-CM

## 2018-07-21 NOTE — Therapy (Signed)
[facility] [address] [place], [place], [address] Phone: [phone]   Fax:  [phone]  Physical Therapy Treatment  Patient Details  Name: [name] MRN: [id number] Date of Birth: January 02, 1961 Referring Provider: Nelwyn Salisbury, MD   Encounter Date: 07/21/2018  PT End of Session - 07/21/18 0809    Visit Number  12    Date for PT Re-Evaluation  08/05/18    PT Start Time  0806    PT Stop Time  0852    PT Time Calculation (min)  46 min    Activity Tolerance  Patient tolerated treatment well    Behavior During Therapy  [facility] for tasks assessed/performed       Past Medical History:  Diagnosis Date  . Allergy    Dr Stevphen Rochester  . Cardiac arrhythmia   . Depression   . Diabetes mellitus without complication ([facility])   . Fibroids    uterine sees Dr. Neva Seat in [place]   . Genital warts   . Heart murmur   . Hypertension   . Irritable bowel syndrome (IBS)   . Low back pain    has a herniated lumbar disc sees Dr August Saucer    Past Surgical History:  Procedure Laterality Date  . ABDOMINOPLASTY  04/2013  . COLONOSCOPY  02-10-15   per Dr. Rhea Belton, clear, repeat in 10 yrs   . LUMBAR LAMINECTOMY     L3-4 on 02-24-10 per Dr Otelia Sergeant   . MYOMECTOMY     laser ablation of uterine fibroids per Dr Dareen Piano 2000  . TUBAL LIGATION      There were no vitals filed for this visit.  Subjective Assessment - 07/21/18 0810    Subjective  Felt good after last session. I can pin point my pain better now, seems to be coming from under my arm pit. Pain can wake me up still.     Limitations  Walking;Lifting;House hold activities    Currently in Pain?  No/denies    Multiple Pain Sites  No                       [facility] Adult PT Treatment/Exercise - 07/21/18 0001      Therapeutic Activites    Therapeutic Activities  Lifting    Lifting  1#, 2#, 3# up and down from 2nd shelf       Shoulder Exercises: Seated   Horizontal ABduction  Strengthening;Both;10 reps;Theraband    Theraband Level (Shoulder Horizontal ABduction)  Level 2 (Red)    External Rotation  Strengthening;Both;10 reps;Theraband    Theraband Level (Shoulder External Rotation)  Level 2 (Red)    Diagonals  Strengthening;Both;10 reps;Theraband    Theraband Level (Shoulder Diagonals)  Level 2 (Red)      Shoulder Exercises: Pulleys   Flexion  3 minutes Concurrent review of status with pt.     Scaption  3 minutes      Manual Therapy   Soft tissue mobilization  right upper trap and subscapularis RT axilla, very tender               PT Short Term Goals - 06/30/18 5366      PT SHORT TERM GOAL #2   Title  pt will be able to bring 1-2 lb down from a shelf at shoulder height using her right UE.    Status  Achieved        PT Long Term Goals - 07/16/18 4403  PT LONG TERM GOAL #1   Title  ind with advanced HEP    Status  On-going      PT LONG TERM GOAL #3   Title  pt able to move 3lb objects up and down from an overhead shelf without increased pain or muscle spasm    Baseline  can't do this for more than one minute without needing to stop and rest, pain goes away immediately    Status  Partially Met      PT LONG TERM GOAL #4   Title  FOTO < or = to 38% limited    Baseline  35% limited 07/16/18    Status  Achieved      PT LONG TERM GOAL #5   Title  pt will increase right shoulder IR to T8 for improved ability to get dressed    Baseline  Met initial goal of L3; currently at T10    Status  Revised            Plan - 07/21/18 0809    Clinical Impression Statement  Pt presents with no pain today but a reported increased awareness of where her pain is coming from. Feels it originates from deep in her axilla. Minor verbal cuing for upper trap deactivation during strengthening exercises today. Pt remains  with active trigger points along her upper trap and lats where they insert. This area was very painful/sensitive to  soft tissue work. it was almost nerve like sensitivity. Introduced pt to the [employer] and where to buy one so she could work on her upper trap more often.      Rehab Potential  Good    Clinical Impairments Affecting Rehab Potential  concussion likely (not diagnosed yet)    PT Frequency  3x / week    PT Duration  12 weeks    PT Treatment/Interventions  ADLs/Self Care Home Management;Biofeedback;Cryotherapy;Electrical Stimulation;Moist Heat;Traction;Gait training;Therapeutic activities;Therapeutic exercise;Passive range of motion;Dry needling;Taping;Manual techniques;Neuromuscular re-education;Patient/family education    PT Next Visit Plan  Soft tissue work to Rt axilla, maybe neural tension stretching to RTUE.     PT Home Exercise Plan   Access Code: [email or web address]    Consulted and Agree with Plan of Care  Patient       Patient will benefit from skilled therapeutic intervention in order to improve the following deficits and impairments:  Abnormal gait, Pain, Increased fascial restricitons, Decreased strength, Decreased range of motion, Postural dysfunction, Increased muscle spasms, Impaired UE functional use  Visit Diagnosis: Other muscle spasm  Acute pain of right shoulder  Cervicalgia  Acute low back pain without sciatica, unspecified back pain laterality     Problem List Patient Active Problem List   Diagnosis Date Noted  . 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 Type 2 diabetes mellitus without complication, without long-term current use of insulin (HCC) 02/22/2017  . Loss of transverse plantar arch 10/31/2016  . Pronation deformity of ankle, acquired 10/31/2016  . Patellofemoral pain syndrome 09/23/2015  . Diabetes mellitus, type II (HCC) 04/27/2015  . OSA (obstructive sleep apnea) 03/31/2014  . Asthma, chronic 08/14/2013  . Leg edema 08/14/2013  . Costochondritis 04/10/2013  . DEGENERATIVE JOINT DISEASE 09/11/2010  . SYNCOPE 07/12/2010  . VAGINITIS, BACTERIAL 06/28/2010  .  GERD 03/17/2010  . PALPITATIONS 03/17/2010  . CHEST PAIN 03/17/2010  . ACUTE BRONCHITIS 01/23/2010  . CANDIDIASIS OF VULVA AND VAGINA 01/06/2010  . DEPRESSION, MODERATE, RECURRENT 01/06/2010  . LOW BACK PAIN 01/06/2010  . SCIATICA 08/30/2009  . TROCHANTERIC  BURSITIS 08/09/2009  . ABDOMINAL PAIN RIGHT LOWER QUADRANT 01/12/2008  . MORTON'S NEUROMA 09/30/2007  . ETHMOID SINUSITIS 09/30/2007  . LEG PAIN, BILATERAL 09/30/2007  . Abdominal pain, other specified site 09/30/2007  . DEPRESSION 09/22/2007  . Essential hypertension 09/22/2007    COCHRAN,JENNIFER, PTA 07/21/2018, 9:25 AM  [facility] [address], [address] [place], [place], [address] Phone: [phone]   Fax:  [phone]  Name: ELLIETTE THIBODAUX MRN: [id number] Date of Birth: 1961/08/06

## 2018-07-23 ENCOUNTER — Ambulatory Visit: Payer: BC Managed Care – PPO | Admitting: Physical Therapy

## 2018-07-23 DIAGNOSIS — M545 Low back pain, unspecified: Secondary | ICD-10-CM

## 2018-07-23 DIAGNOSIS — M62838 Other muscle spasm: Secondary | ICD-10-CM

## 2018-07-23 DIAGNOSIS — M25511 Pain in right shoulder: Secondary | ICD-10-CM

## 2018-07-23 DIAGNOSIS — M542 Cervicalgia: Secondary | ICD-10-CM

## 2018-07-23 NOTE — Therapy (Signed)
[facility] [address] [place], [place], [address] Phone: [phone]   Fax:  [phone]  Physical Therapy Treatment  Patient Details  Name: [name] MRN: [id number] Date of Birth: 1961-09-16 Referring Provider: Laurey Morale, MD   Encounter Date: 07/23/2018  PT End of Session - 07/23/18 0802    Visit Number  13    Date for PT Re-Evaluation  08/05/18    PT Start Time  0801 Pt has to leave early    PT Stop Time  0830    PT Time Calculation (min)  29 min    Activity Tolerance  Patient tolerated treatment well    Behavior During Therapy  [facility] for tasks assessed/performed       Past Medical History:  Diagnosis Date  . Allergy    Dr Caprice Red  . Cardiac arrhythmia   . Depression   . Diabetes mellitus without complication ([place])   . Fibroids    uterine sees Dr. Carlota Raspberry in [place]   . Genital warts   . Heart murmur   . Hypertension   . Irritable bowel syndrome (IBS)   . Low back pain    has a herniated lumbar disc sees Dr Marlou Sa    Past Surgical History:  Procedure Laterality Date  . ABDOMINOPLASTY  04/2013  . COLONOSCOPY  02-10-15   per Dr. Hilarie Fredrickson, clear, repeat in 10 yrs   . LUMBAR LAMINECTOMY     L3-4 on 02-24-10 per Dr Louanne Skye   . MYOMECTOMY     laser ablation of uterine fibroids per Dr Ouida Sills 2000  . TUBAL LIGATION      There were no vitals filed for this visit.  Subjective Assessment - 07/23/18 0826    Subjective  I need to leave early to get my husband to [place]. I have not had tingling in my fingers since last session. End of day my back aches.    Currently in Pain?  No/denies    Multiple Pain Sites  No                       [facility] Adult PT Treatment/Exercise - 07/23/18 0001      Manual Therapy   Soft tissue mobilization  Rt subscap insertion, axilla huge, tender trigger point, Soft tissue work area surrounding and down lateral trunk ( lats)               PT Education - 07/23/18 0830    Education Details  Contracting the TvA    Person(s) Educated  Patient    Methods  Explanation;Demonstration;Verbal cues    Comprehension  Returned demonstration;Verbalized understanding       PT Short Term Goals - 06/30/18 0821      PT SHORT TERM GOAL #2   Title  pt will be able to bring 1-2 lb down from a shelf at shoulder height using her right UE.    Status  Achieved        PT Long Term Goals - 07/16/18 6294      PT LONG TERM GOAL #1   Title  ind with advanced HEP    Status  On-going      PT LONG TERM GOAL #3   Title  pt able to move 3lb objects up and down from an overhead shelf without increased pain or muscle spasm    Baseline  can't do this for more than one minute without needing to stop  [id number] Date of Birth: 03-15-1961  [facility] [address] [place], [place], [address] Phone: [phone]   Fax:  [phone]  Physical Therapy Treatment  Patient Details  Name: [name] MRN: [id number] Date of Birth: 1961-09-16 Referring Provider: Laurey Morale, MD   Encounter Date: 07/23/2018  PT End of Session - 07/23/18 0802    Visit Number  13    Date for PT Re-Evaluation  08/05/18    PT Start Time  0801 Pt has to leave early    PT Stop Time  0830    PT Time Calculation (min)  29 min    Activity Tolerance  Patient tolerated treatment well    Behavior During Therapy  [facility] for tasks assessed/performed       Past Medical History:  Diagnosis Date  . Allergy    Dr Caprice Red  . Cardiac arrhythmia   . Depression   . Diabetes mellitus without complication ([place])   . Fibroids    uterine sees Dr. Carlota Raspberry in [place]   . Genital warts   . Heart murmur   . Hypertension   . Irritable bowel syndrome (IBS)   . Low back pain    has a herniated lumbar disc sees Dr Marlou Sa    Past Surgical History:  Procedure Laterality Date  . ABDOMINOPLASTY  04/2013  . COLONOSCOPY  02-10-15   per Dr. Hilarie Fredrickson, clear, repeat in 10 yrs   . LUMBAR LAMINECTOMY     L3-4 on 02-24-10 per Dr Louanne Skye   . MYOMECTOMY     laser ablation of uterine fibroids per Dr Ouida Sills 2000  . TUBAL LIGATION      There were no vitals filed for this visit.  Subjective Assessment - 07/23/18 0826    Subjective  I need to leave early to get my husband to [place]. I have not had tingling in my fingers since last session. End of day my back aches.    Currently in Pain?  No/denies    Multiple Pain Sites  No                       [facility] Adult PT Treatment/Exercise - 07/23/18 0001      Manual Therapy   Soft tissue mobilization  Rt subscap insertion, axilla huge, tender trigger point, Soft tissue work area surrounding and down lateral trunk ( lats)               PT Education - 07/23/18 0830    Education Details  Contracting the TvA    Person(s) Educated  Patient    Methods  Explanation;Demonstration;Verbal cues    Comprehension  Returned demonstration;Verbalized understanding       PT Short Term Goals - 06/30/18 0821      PT SHORT TERM GOAL #2   Title  pt will be able to bring 1-2 lb down from a shelf at shoulder height using her right UE.    Status  Achieved        PT Long Term Goals - 07/16/18 6294      PT LONG TERM GOAL #1   Title  ind with advanced HEP    Status  On-going      PT LONG TERM GOAL #3   Title  pt able to move 3lb objects up and down from an overhead shelf without increased pain or muscle spasm    Baseline  can't do this for more than one minute without needing to stop

## 2018-07-24 ENCOUNTER — Other Ambulatory Visit: Payer: Self-pay

## 2018-07-24 MED ORDER — LISINOPRIL 20 MG PO TABS: 20.0000 mg | ORAL_TABLET | Freq: Two times a day (BID) | ORAL | 0 refills | Status: DC

## 2018-07-24 NOTE — Telephone Encounter (Signed)
From [employer] requesting a 90 day supply for Lisinopril   Rx sent for a 90 day supply

## 2018-07-30 ENCOUNTER — Encounter: Payer: Self-pay | Admitting: Physical Therapy

## 2018-07-30 ENCOUNTER — Ambulatory Visit: Payer: BC Managed Care – PPO | Admitting: Physical Therapy

## 2018-07-30 DIAGNOSIS — M545 Low back pain, unspecified: Secondary | ICD-10-CM

## 2018-07-30 DIAGNOSIS — M62838 Other muscle spasm: Secondary | ICD-10-CM | POA: Diagnosis not present

## 2018-07-30 DIAGNOSIS — M25511 Pain in right shoulder: Secondary | ICD-10-CM

## 2018-07-30 DIAGNOSIS — M542 Cervicalgia: Secondary | ICD-10-CM

## 2018-07-30 NOTE — Therapy (Signed)
[facility] [address] [place], [place], [address] Phone: [phone]   Fax:  [phone]  Physical Therapy Treatment  Patient Details  Name: [name] MRN: [id number] Date of Birth: 08-18-1961 Referring Provider: Laurey Morale, MD   Encounter Date: 07/30/2018  PT End of Session - 07/30/18 0826    Visit Number  14    Date for PT Re-Evaluation  08/05/18    PT Start Time  0807    PT Stop Time  0855    PT Time Calculation (min)  48 min    Activity Tolerance  Patient tolerated treatment well    Behavior During Therapy  [facility] for tasks assessed/performed       Past Medical History:  Diagnosis Date  . Allergy    Dr Caprice Red  . Cardiac arrhythmia   . Depression   . Diabetes mellitus without complication ([place])   . Fibroids    uterine sees Dr. Carlota Raspberry in [place]   . Genital warts   . Heart murmur   . Hypertension   . Irritable bowel syndrome (IBS)   . Low back pain    has a herniated lumbar disc sees Dr Marlou Sa    Past Surgical History:  Procedure Laterality Date  . ABDOMINOPLASTY  04/2013  . COLONOSCOPY  02-10-15   per Dr. Hilarie Fredrickson, clear, repeat in 10 yrs   . LUMBAR LAMINECTOMY     L3-4 on 02-24-10 per Dr Louanne Skye   . MYOMECTOMY     laser ablation of uterine fibroids per Dr Ouida Sills 2000  . TUBAL LIGATION      There were no vitals filed for this visit.  Subjective Assessment - 07/30/18 0827    Subjective  I was ok after working on my arm last session, some better but nothing is consistently better.     How long can you walk comfortably?  1 mile (was walking 6 miles 3x/week)    Patient Stated Goals  return to being able to dressing, house chores, driving, walking    Currently in Pain?  No/denies   With certain movements, nothing constant   Pain Location  Arm    Pain Orientation  Right    Pain Descriptors / Indicators  Dull    Aggravating Factors   Certain movements    Pain Relieving Factors   needling helps    Multiple Pain Sites  No                       [facility] Adult PT Treatment/Exercise - 07/30/18 0001      Shoulder Exercises: Pulleys   Flexion  2 minutes      Shoulder Exercises: Stretch   Other Shoulder Stretches  Standing doorway stretch for Ext Rot 3x 10-15 sec    Shoulder slide up wall in abduction 3x 10-15 sec hold      Cryotherapy   Number Minutes Cryotherapy  10 Minutes   post session   Cryotherapy Location  Shoulder    Type of Cryotherapy  Ice pack      Manual Therapy   Soft tissue mobilization  Rt subscap insertion, axilla huge, tender trigger point, Soft tissue work area surrounding and down lateral trunk ( lats)    anteriolateral deltoid as well with active TP      Trigger Point Dry Needling - 07/30/18 1206    Consent Given?  Yes    Muscles Treated Upper Body  Subscapularis  [facility] [address] [place], [place], [address] Phone: [phone]   Fax:  [phone]  Physical Therapy Treatment  Patient Details  Name: [name] MRN: [id number] Date of Birth: 08-18-1961 Referring Provider: Laurey Morale, MD   Encounter Date: 07/30/2018  PT End of Session - 07/30/18 0826    Visit Number  14    Date for PT Re-Evaluation  08/05/18    PT Start Time  0807    PT Stop Time  0855    PT Time Calculation (min)  48 min    Activity Tolerance  Patient tolerated treatment well    Behavior During Therapy  [facility] for tasks assessed/performed       Past Medical History:  Diagnosis Date  . Allergy    Dr Caprice Red  . Cardiac arrhythmia   . Depression   . Diabetes mellitus without complication ([place])   . Fibroids    uterine sees Dr. Carlota Raspberry in [place]   . Genital warts   . Heart murmur   . Hypertension   . Irritable bowel syndrome (IBS)   . Low back pain    has a herniated lumbar disc sees Dr Marlou Sa    Past Surgical History:  Procedure Laterality Date  . ABDOMINOPLASTY  04/2013  . COLONOSCOPY  02-10-15   per Dr. Hilarie Fredrickson, clear, repeat in 10 yrs   . LUMBAR LAMINECTOMY     L3-4 on 02-24-10 per Dr Louanne Skye   . MYOMECTOMY     laser ablation of uterine fibroids per Dr Ouida Sills 2000  . TUBAL LIGATION      There were no vitals filed for this visit.  Subjective Assessment - 07/30/18 0827    Subjective  I was ok after working on my arm last session, some better but nothing is consistently better.     How long can you walk comfortably?  1 mile (was walking 6 miles 3x/week)    Patient Stated Goals  return to being able to dressing, house chores, driving, walking    Currently in Pain?  No/denies   With certain movements, nothing constant   Pain Location  Arm    Pain Orientation  Right    Pain Descriptors / Indicators  Dull    Aggravating Factors   Certain movements    Pain Relieving Factors   needling helps    Multiple Pain Sites  No                       [facility] Adult PT Treatment/Exercise - 07/30/18 0001      Shoulder Exercises: Pulleys   Flexion  2 minutes      Shoulder Exercises: Stretch   Other Shoulder Stretches  Standing doorway stretch for Ext Rot 3x 10-15 sec    Shoulder slide up wall in abduction 3x 10-15 sec hold      Cryotherapy   Number Minutes Cryotherapy  10 Minutes   post session   Cryotherapy Location  Shoulder    Type of Cryotherapy  Ice pack      Manual Therapy   Soft tissue mobilization  Rt subscap insertion, axilla huge, tender trigger point, Soft tissue work area surrounding and down lateral trunk ( lats)    anteriolateral deltoid as well with active TP      Trigger Point Dry Needling - 07/30/18 1206    Consent Given?  Yes    Muscles Treated Upper Body  Subscapularis  03/17/2010  . ACUTE BRONCHITIS 01/23/2010  . CANDIDIASIS OF VULVA AND VAGINA 01/06/2010  . DEPRESSION, MODERATE, RECURRENT 01/06/2010  . LOW BACK PAIN 01/06/2010  . SCIATICA 08/30/2009  . TROCHANTERIC BURSITIS 08/09/2009  . ABDOMINAL PAIN RIGHT LOWER QUADRANT 01/12/2008  . MORTON'S NEUROMA 09/30/2007  . ETHMOID SINUSITIS 09/30/2007  . LEG PAIN, BILATERAL 09/30/2007  . Abdominal pain, other specified site 09/30/2007  . DEPRESSION 09/22/2007  . Essential hypertension 09/22/2007   Myrene Galas, PTA 07/30/18 12:40 PM  Zannie Cove PTA 07/30/2018, 12:06 PM  [facility] [address] [place], [place], [address] Phone: [phone]   Fax:  [phone]  Name: [name] MRN: [id number] Date of Birth: February 09, 1961

## 2018-08-01 ENCOUNTER — Encounter: Payer: Self-pay | Admitting: Physical Therapy

## 2018-08-01 ENCOUNTER — Ambulatory Visit: Payer: BC Managed Care – PPO | Admitting: Physical Therapy

## 2018-08-01 DIAGNOSIS — M542 Cervicalgia: Secondary | ICD-10-CM

## 2018-08-01 DIAGNOSIS — M25511 Pain in right shoulder: Secondary | ICD-10-CM

## 2018-08-01 DIAGNOSIS — M62838 Other muscle spasm: Secondary | ICD-10-CM | POA: Diagnosis not present

## 2018-08-01 DIAGNOSIS — M545 Low back pain, unspecified: Secondary | ICD-10-CM

## 2018-08-01 NOTE — Therapy (Signed)
[facility] [address] [place], [place], [address] Phone: [phone]   Fax:  [phone]  Physical Therapy Treatment  Patient Details  Name: [name] MRN: [id number] Date of Birth: [birth year] Referring Provider: Laurey Morale, MD   Encounter Date: 08/01/2018  PT End of Session - 08/01/18 0831    Visit Number  15    Date for PT Re-Evaluation  08/05/18    PT Start Time  0813   pt late   PT Stop Time  0840    PT Time Calculation (min)  27 min    Activity Tolerance  Patient tolerated treatment well    Behavior During Therapy  [facility] for tasks assessed/performed       Past Medical History:  Diagnosis Date  . Allergy    Dr Caprice Red  . Cardiac arrhythmia   . Depression   . Diabetes mellitus without complication ([place])   . Fibroids    uterine sees Dr. Carlota Raspberry in [place]   . Genital warts   . Heart murmur   . Hypertension   . Irritable bowel syndrome (IBS)   . Low back pain    has a herniated lumbar disc sees Dr Marlou Sa    Past Surgical History:  Procedure Laterality Date  . ABDOMINOPLASTY  04/2013  . COLONOSCOPY  02-10-15   per Dr. Hilarie Fredrickson, clear, repeat in 10 yrs   . LUMBAR LAMINECTOMY     L3-4 on 02-24-10 per Dr Louanne Skye   . MYOMECTOMY     laser ablation of uterine fibroids per Dr Ouida Sills 2000  . TUBAL LIGATION      There were no vitals filed for this visit.  Subjective Assessment - 08/01/18 0839    Subjective  Sore from needling, but a good sore. returns to work today.     Limitations  Walking;Lifting;House hold activities    Currently in Pain?  No/denies   Soreness when you touch my muscles.                       [place] Adult PT Treatment/Exercise - 08/01/18 0001      Shoulder Exercises: Seated   Other Seated Exercises  yellow band ext rot 2x10   Instructed pt to resume at home     Shoulder Exercises: Standing   Other Standing Exercises  AROM: deltoid 3 way raise 10x  0#      Manual Therapy   Soft tissue mobilization  Rt subscap insertion, axilla huge, tender trigger point, Soft tissue work area surrounding and down lateral trunk ( lats)    anteriolateral deltoid as well with active TP              PT Short Term Goals - 06/30/18 7342      PT SHORT TERM GOAL #2   Title  pt will be able to bring 1-2 lb down from a shelf at shoulder height using her right UE.    Status  Achieved        PT Long Term Goals - 07/16/18 8768      PT LONG TERM GOAL #1   Title  ind with advanced HEP    Status  On-going      PT LONG TERM GOAL #3   Title  pt able to move 3lb objects up and down from an overhead shelf without increased pain or muscle spasm    Baseline  can't do this for more than one  minute without needing to stop and rest, pain goes away immediately    Status  Partially Met      PT LONG TERM GOAL #4   Title  FOTO < or = to 38% limited    Baseline  35% limited 07/16/18    Status  Achieved      PT LONG TERM GOAL #5   Title  pt will increase right shoulder IR to T8 for improved ability to get dressed    Baseline  Met initial goal of L3; currently at T10    Status  Revised            Plan - 08/01/18 0842    Clinical Impression Statement  Pt tolerated the dry needling well, some soreness the next day but nothing unreasonable. PTA noted pt could get her RTUE into positions much easier today for manual work.  She remains with active trigger points in her deltoid and subscap.     Rehab Potential  Good    Clinical Impairments Affecting Rehab Potential  concussion likely (not diagnosed yet)    PT Frequency  3x / week    PT Duration  12 weeks    PT Treatment/Interventions  ADLs/Self Care Home Management;Biofeedback;Cryotherapy;Electrical Stimulation;Moist Heat;Traction;Gait training;Therapeutic activities;Therapeutic exercise;Passive range of motion;Dry needling;Taping;Manual techniques;Neuromuscular re-education;Patient/family education    PT  Next Visit Plan  Re-assess next session by PT.     PT Home Exercise Plan   Access Code: [email or web address]    Consulted and Agree with Plan of Care  Patient       Patient will benefit from skilled therapeutic intervention in order to improve the following deficits and impairments:  Abnormal gait, Pain, Increased fascial restricitons, Decreased strength, Decreased range of motion, Postural dysfunction, Increased muscle spasms, Impaired UE functional use  Visit Diagnosis: Other muscle spasm  Acute pain of right shoulder  Cervicalgia  Acute low back pain without sciatica, unspecified back pain laterality     Problem List Patient Active Problem List   Diagnosis Date Noted  . 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 Type 2 diabetes mellitus without complication, without long-term current use of insulin ([place]) 02/22/2017  . Loss of transverse plantar arch 10/31/2016  . Pronation deformity of ankle, acquired 10/31/2016  . Patellofemoral pain syndrome 09/23/2015  . Diabetes mellitus, type II ([place]) 04/27/2015  . OSA (obstructive sleep apnea) 03/31/2014  . Asthma, chronic 08/14/2013  . Leg edema 08/14/2013  . Costochondritis 04/10/2013  . DEGENERATIVE JOINT DISEASE 09/11/2010  . SYNCOPE 07/12/2010  . VAGINITIS, BACTERIAL 06/28/2010  . GERD 03/17/2010  . PALPITATIONS 03/17/2010  . CHEST PAIN 03/17/2010  . ACUTE BRONCHITIS 01/23/2010  . CANDIDIASIS OF VULVA AND VAGINA 01/06/2010  . DEPRESSION, MODERATE, RECURRENT 01/06/2010  . LOW BACK PAIN 01/06/2010  . SCIATICA 08/30/2009  . TROCHANTERIC BURSITIS 08/09/2009  . ABDOMINAL PAIN RIGHT LOWER QUADRANT 01/12/2008  . MORTON'S NEUROMA 09/30/2007  . ETHMOID SINUSITIS 09/30/2007  . LEG PAIN, BILATERAL 09/30/2007  . Abdominal pain, other specified site 09/30/2007  . DEPRESSION 09/22/2007  . Essential hypertension 09/22/2007    Lynx Goodrich, PTA 08/01/2018, 8:45 AM  [facility] [address], [place], [place], [address] Phone: [phone]   Fax:  [phone]  Name: [name] MRN: [id number] Date of Birth: [birth year]

## 2018-08-01 NOTE — Patient Instructions (Signed)
   To Personal Trainer working with [name];  [name] is allowed to proceed with gentle shoulder strength for her right shoulder. Progress her strength as she tolerates. Needs scapular stabilization as well.   Thank you,  Myrene Galas, PTA [facility] [phone]

## 2018-08-05 ENCOUNTER — Ambulatory Visit: Payer: BC Managed Care – PPO | Admitting: Physical Therapy

## 2018-08-05 ENCOUNTER — Encounter: Payer: Self-pay | Admitting: Physical Therapy

## 2018-08-05 DIAGNOSIS — M25511 Pain in right shoulder: Secondary | ICD-10-CM

## 2018-08-05 DIAGNOSIS — M62838 Other muscle spasm: Secondary | ICD-10-CM | POA: Diagnosis not present

## 2018-08-05 DIAGNOSIS — M545 Low back pain, unspecified: Secondary | ICD-10-CM

## 2018-08-05 DIAGNOSIS — M542 Cervicalgia: Secondary | ICD-10-CM

## 2018-08-05 NOTE — Therapy (Signed)
[facility] [address], [place], [place], [address] Phone: [phone]   Fax:  [phone]  Physical Therapy Treatment  Patient Details  Name: [name] MRN: [id number] Date of Birth: 1961-04-24 Referring Provider: Laurey Morale, MD   Encounter Date: 08/05/2018  PT End of Session - 08/05/18 0917    Visit Number  16    Date for PT Re-Evaluation  08/05/18    PT Start Time  0848    PT Stop Time  0928   5 min moist heat   PT Time Calculation (min)  40 min    Activity Tolerance  Patient tolerated treatment well    Behavior During Therapy  [facility] for tasks assessed/performed       Past Medical History:  Diagnosis Date  . Allergy    Dr Caprice Red  . Cardiac arrhythmia   . Depression   . Diabetes mellitus without complication ([place])   . Fibroids    uterine sees Dr. Carlota Raspberry in [place]   . Genital warts   . Heart murmur   . Hypertension   . Irritable bowel syndrome (IBS)   . Low back pain    has a herniated lumbar disc sees Dr Marlou Sa    Past Surgical History:  Procedure Laterality Date  . ABDOMINOPLASTY  04/2013  . COLONOSCOPY  02-10-15   per Dr. Hilarie Fredrickson, clear, repeat in 10 yrs   . LUMBAR LAMINECTOMY     L3-4 on 02-24-10 per Dr Louanne Skye   . MYOMECTOMY     laser ablation of uterine fibroids per Dr Ouida Sills 2000  . TUBAL LIGATION      There were no vitals filed for this visit.  Subjective Assessment - 08/05/18 0856    Subjective  I feel about 90% better.  Too much walking still aggravates my hip, but shoulder feels a lot better.    How long can you walk comfortably?  3-4 miles every day    Currently in Pain?  No/denies         [facility] PT Assessment - 08/05/18 0001      AROM   Right Shoulder Flexion  145 Degrees    Right Shoulder ABduction  140 Degrees                             PT Short Term Goals - 08/05/18 2409      PT SHORT TERM GOAL #1   Title  ind with initial HEP     Status  Achieved      PT SHORT TERM GOAL #2   Title  pt will be able to bring 1-2 lb down from a shelf at shoulder height using her right UE.    Status  Achieved      PT SHORT TERM GOAL #3   Title  improved cervical rotation of at least 50 deg bilaterally for improved ability to drive    Status  Achieved      PT SHORT TERM GOAL #4   Title  able to perform shoulder AROM at least 120 degrees flexion and abduction for improved function    Status  Achieved        PT Long Term Goals - 08/05/18 7353      PT LONG TERM GOAL #1   Title  ind with advanced HEP    Status  Achieved      PT LONG TERM GOAL #2  Title  right grip strength increased to > or = to 45 lb in order to be able to open jars    Status  Achieved      PT LONG TERM GOAL #3   Title  pt able to move 3lb objects up and down from an overhead shelf without increased pain or muscle spasm    Baseline  did 4 reps without increased pain    Status  Achieved      PT LONG TERM GOAL #4   Title  FOTO < or = to 38% limited    Baseline  35% limited 07/16/18    Status  Achieved      PT LONG TERM GOAL #5   Title  pt will increase right shoulder IR to T8 for improved ability to get dressed    Baseline  Met initial goal of L3; currently at T10    Status  Not Met            Plan - 08/05/18 0924    Clinical Impression Statement  Pt continues to have some tenderness and trigger points but has improved 90%.  She will benefit from continueing to work on stretches and HEP to progress ROM and strength.  Pt will discharge from skilled PT today.    PT Treatment/Interventions  ADLs/Self Care Home Management;Biofeedback;Cryotherapy;Electrical Stimulation;Moist Heat;Traction;Gait training;Therapeutic activities;Therapeutic exercise;Passive range of motion;Dry needling;Taping;Manual techniques;Neuromuscular re-education;Patient/family education    PT Next Visit Plan  discharged    PT Home Exercise Plan   Access Code: [email or web address]     Consulted and Agree with Plan of Care  Patient       Patient will benefit from skilled therapeutic intervention in order to improve the following deficits and impairments:  Abnormal gait, Pain, Increased fascial restricitons, Decreased strength, Decreased range of motion, Postural dysfunction, Increased muscle spasms, Impaired UE functional use  Visit Diagnosis: Other muscle spasm  Acute pain of right shoulder  Cervicalgia  Acute low back pain without sciatica, unspecified back pain laterality     Problem List Patient Active Problem List   Diagnosis Date Noted  . 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 Type 2 diabetes mellitus without complication, without long-term current use of insulin ([place]) 02/22/2017  . Loss of transverse plantar arch 10/31/2016  . Pronation deformity of ankle, acquired 10/31/2016  . Patellofemoral pain syndrome 09/23/2015  . Diabetes mellitus, type II ([place]) 04/27/2015  . OSA (obstructive sleep apnea) 03/31/2014  . Asthma, chronic 08/14/2013  . Leg edema 08/14/2013  . Costochondritis 04/10/2013  . DEGENERATIVE JOINT DISEASE 09/11/2010  . SYNCOPE 07/12/2010  . VAGINITIS, BACTERIAL 06/28/2010  . GERD 03/17/2010  . PALPITATIONS 03/17/2010  . CHEST PAIN 03/17/2010  . ACUTE BRONCHITIS 01/23/2010  . CANDIDIASIS OF VULVA AND VAGINA 01/06/2010  . DEPRESSION, MODERATE, RECURRENT 01/06/2010  . LOW BACK PAIN 01/06/2010  . SCIATICA 08/30/2009  . TROCHANTERIC BURSITIS 08/09/2009  . ABDOMINAL PAIN RIGHT LOWER QUADRANT 01/12/2008  . MORTON'S NEUROMA 09/30/2007  . ETHMOID SINUSITIS 09/30/2007  . LEG PAIN, BILATERAL 09/30/2007  . Abdominal pain, other specified site 09/30/2007  . DEPRESSION 09/22/2007  . Essential hypertension 09/22/2007    Zannie Cove, PT 08/05/2018, 9:28 AM  [facility] [address] [place], [place], [address] Phone: [phone]   Fax:  [phone]  Name: [name] MRN: [id number] Date of Birth: [birth year]  PHYSICAL THERAPY DISCHARGE SUMMARY  Visits from Start of Care: 16  Current functional level related to goals /  Title  right grip strength increased to > or = to 45 lb in order to be able to open jars    Status  Achieved      PT LONG TERM GOAL #3   Title  pt able to move 3lb objects up and down from an overhead shelf without increased pain or muscle spasm    Baseline  did 4 reps without increased pain    Status  Achieved      PT LONG TERM GOAL #4   Title  FOTO < or = to 38% limited    Baseline  35% limited 07/16/18    Status  Achieved      PT LONG TERM GOAL #5   Title  pt will increase right shoulder IR to T8 for improved ability to get dressed    Baseline  Met initial goal of L3; currently at T10    Status  Not Met            Plan - 08/05/18 0924    Clinical Impression Statement  Pt continues to have some tenderness and trigger points but has improved 90%.  She will benefit from continueing to work on stretches and HEP to progress ROM and strength.  Pt will discharge from skilled PT today.    PT Treatment/Interventions  ADLs/Self Care Home Management;Biofeedback;Cryotherapy;Electrical Stimulation;Moist Heat;Traction;Gait training;Therapeutic activities;Therapeutic exercise;Passive range of motion;Dry needling;Taping;Manual techniques;Neuromuscular re-education;Patient/family education    PT Next Visit Plan  discharged    PT Home Exercise Plan   Access Code: [email or web address]     Consulted and Agree with Plan of Care  Patient       Patient will benefit from skilled therapeutic intervention in order to improve the following deficits and impairments:  Abnormal gait, Pain, Increased fascial restricitons, Decreased strength, Decreased range of motion, Postural dysfunction, Increased muscle spasms, Impaired UE functional use  Visit Diagnosis: Other muscle spasm  Acute pain of right shoulder  Cervicalgia  Acute low back pain without sciatica, unspecified back pain laterality     Problem List Patient Active Problem List   Diagnosis Date Noted  . 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 Type 2 diabetes mellitus without complication, without long-term current use of insulin ([place]) 02/22/2017  . Loss of transverse plantar arch 10/31/2016  . Pronation deformity of ankle, acquired 10/31/2016  . Patellofemoral pain syndrome 09/23/2015  . Diabetes mellitus, type II ([place]) 04/27/2015  . OSA (obstructive sleep apnea) 03/31/2014  . Asthma, chronic 08/14/2013  . Leg edema 08/14/2013  . Costochondritis 04/10/2013  . DEGENERATIVE JOINT DISEASE 09/11/2010  . SYNCOPE 07/12/2010  . VAGINITIS, BACTERIAL 06/28/2010  . GERD 03/17/2010  . PALPITATIONS 03/17/2010  . CHEST PAIN 03/17/2010  . ACUTE BRONCHITIS 01/23/2010  . CANDIDIASIS OF VULVA AND VAGINA 01/06/2010  . DEPRESSION, MODERATE, RECURRENT 01/06/2010  . LOW BACK PAIN 01/06/2010  . SCIATICA 08/30/2009  . TROCHANTERIC BURSITIS 08/09/2009  . ABDOMINAL PAIN RIGHT LOWER QUADRANT 01/12/2008  . MORTON'S NEUROMA 09/30/2007  . ETHMOID SINUSITIS 09/30/2007  . LEG PAIN, BILATERAL 09/30/2007  . Abdominal pain, other specified site 09/30/2007  . DEPRESSION 09/22/2007  . Essential hypertension 09/22/2007    Zannie Cove, PT 08/05/2018, 9:28 AM  [facility] [address] [place], [place], [address] Phone: [phone]   Fax:  [phone]  Name: [name] MRN: [id number] Date of Birth: [birth year]  PHYSICAL THERAPY DISCHARGE SUMMARY  Visits from Start of Care: 16  Current functional level related to goals /

## 2018-08-14 ENCOUNTER — Other Ambulatory Visit: Payer: Self-pay | Admitting: Family Medicine

## 2018-08-14 DIAGNOSIS — Z1231 Encounter for screening mammogram for malignant neoplasm of breast: Secondary | ICD-10-CM

## 2018-09-05 ENCOUNTER — Other Ambulatory Visit: Payer: Self-pay | Admitting: Family Medicine

## 2018-09-12 ENCOUNTER — Ambulatory Visit
Admission: RE | Admit: 2018-09-12 | Discharge: 2018-09-12 | Disposition: A | Discharge status: Home / Self Care | Payer: BC Managed Care – PPO | Source: Ambulatory Visit | Attending: Family Medicine | Admitting: Family Medicine

## 2018-09-12 DIAGNOSIS — Z1231 Encounter for screening mammogram for malignant neoplasm of breast: Secondary | ICD-10-CM

## 2018-09-30 ENCOUNTER — Ambulatory Visit: Payer: BC Managed Care – PPO | Admitting: Family Medicine

## 2018-09-30 ENCOUNTER — Ambulatory Visit (INDEPENDENT_AMBULATORY_CARE_PROVIDER_SITE_OTHER): Payer: BC Managed Care – PPO

## 2018-09-30 ENCOUNTER — Encounter: Payer: Self-pay | Admitting: Family Medicine

## 2018-09-30 VITALS — BP 128/74 | HR 98 | Ht 64.0 in | Wt 184.0 lb

## 2018-09-30 DIAGNOSIS — M25562 Pain in left knee: Secondary | ICD-10-CM

## 2018-09-30 MED ORDER — IBUPROFEN-FAMOTIDINE 800-26.6 MG PO TABS
1.0000 | ORAL_TABLET | Freq: Three times a day (TID) | ORAL | 3 refills | Status: DC
Start: 2018-09-30 — End: 2019-03-30

## 2018-09-30 NOTE — Patient Instructions (Signed)
Nice to meet you  Please try the ace wrap when you're up walking around.  Please try the medicine for 5-7 days and then as needed  Please try the exercises  Please follow up with me in 3-4 weeks.
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
## 2018-10-01 DIAGNOSIS — M25562 Pain in left knee: Secondary | ICD-10-CM | POA: Insufficient documentation

## 2018-10-01 NOTE — Assessment & Plan Note (Signed)
Based on clinical exam it would suggest that she has a degenerative meniscal tear.  She had well-preserved joint space with no significant degenerative changes on ultrasound. -Duexis. -Counseled on home exercise therapy. - counseled on supportive care. -If no improvement would consider imaging and injection.

## 2018-10-02 ENCOUNTER — Ambulatory Visit: Payer: BC Managed Care – PPO | Admitting: Family Medicine

## 2018-10-02 ENCOUNTER — Encounter: Payer: Self-pay | Admitting: Family Medicine

## 2018-10-02 VITALS — BP 136/86 | HR 58 | Temp 97.6°F | Ht 64.0 in | Wt 182.8 lb

## 2018-10-02 DIAGNOSIS — S91131A Puncture wound without foreign body of right great toe without damage to nail, initial encounter: Secondary | ICD-10-CM

## 2018-10-02 DIAGNOSIS — Z23 Encounter for immunization: Secondary | ICD-10-CM | POA: Diagnosis not present

## 2018-10-02 NOTE — Patient Instructions (Signed)
Puncture Wound A puncture wound is an injury that is caused by a sharp, thin object that goes through your skin, such as a nail. A puncture wound usually does not leave a large opening in your skin, so it may not bleed a lot. However, when you get a puncture wound, dirt or other materials (foreign bodies) can be forced into your wound and break off inside. This makes it more likely that an infection will happen, such as tetanus. Follow these instructions at home: Medicines  Take or apply over-the-counter and prescription medicines only as told by your doctor.  If you were prescribed an antibiotic medicine, take or apply it as told by your doctor. Do not stop using the antibiotic even if your condition starts to get better. Wound care  There are many ways to close and cover a wound. For example, a wound can be covered with stitches (sutures), skin glue, or adhesive strips. Follow instructions from your doctor about: ? How to take care of your wound. ? When and how you should change your bandage (dressing). ? When you should remove your bandage. ? Removing whatever was used to close your wound.  Keep the bandage dry as told by your doctor. Do not take baths, swim, use a hot tub, or do anything that would put your wound underwater until your doctor says it is okay.  Clean the wound as told by your doctor.  Do not scratch or pick at the wound.  Check your wound every day for signs of infection. Watch for: ? Redness, swelling, or pain. ? Fluid, blood, or pus. General instructions  Raise (elevate) the injured area above the level of your heart while you are sitting or lying down.  If your puncture wound is in your foot, ask your doctor if you need to avoid putting weight on your foot and for how long.  Keep all follow-up visits as told by your doctor. This is important. Contact a doctor if:  You got a tetanus shot and you have any of these problems at the injection  site: ? Swelling. ? Very bad pain. ? Redness. ? Bleeding.  You have a fever.  Your stitches come out.  You notice a bad smell coming from your wound or your bandage.  You notice something coming out of the wound, such as wood or glass.  Medicine does not help your pain.  You have more redness, swelling, or pain at the site of your wound.  You have fluid, blood, or pus coming from your wound.  You notice a change in the color of your skin near your wound.  You need to change the bandage often because fluid, blood, or pus is coming from the wound.  You start to have a new rash.  You start to have numbness around the wound. Get help right away if:  You have very bad swelling around the wound.  Your pain suddenly gets worse and is very bad.  You start to get painful skin lumps.  You have a red streak going away from your wound.  The wound is on your hand or foot and you cannot move a finger or toe like you usually can.  The wound is on your hand or foot and you notice that your fingers or toes look pale or bluish. This information is not intended to replace advice given to you by your health care provider. Make sure you discuss any questions you have with your health care provider. Document   Released: 09/11/2008 Document Revised: 05/10/2016 Document Reviewed: 01/26/2015 Elsevier Interactive Patient Education  2018 Elsevier Inc[employer]  

## 2018-10-02 NOTE — Progress Notes (Signed)
Patient: [name] MRN: [id number] DOB: 1961/07/22 PCP: Stephanie Salisbury, MD     Subjective:  Chief Complaint  Patient presents with  . puncture wound to right big toe    HPI: The patient is a 57 y.o. female who presents today for puncture wound to her right big toe. She was at school today and was walking and stepped on a rusty nail and it punctured her right big toe. She had just a tiny bit of blood. No pain, redness or swelling. She is not up to date on her tetanus and she is a diabetic that is controlled with diet and exercise. Last a1c was 6.4. In 01/2018.   Review of Systems  Constitutional: Positive for chills. Negative for fever.       Pt had chills prior to stepping on nail today.    Skin: Positive for wound.       Wound on right big toe.  Slight discomfort.    Allergies Patient is allergic to hctz [hydrochlorothiazide]; peanut-containing drug products; ciprofloxacin; other; shellfish-derived products; and cephalexin.  Past Medical History Patient  has a past medical history of Allergy, Cardiac arrhythmia, Depression, Diabetes mellitus without complication (HCC), Fibroids, Genital warts, Heart murmur, Hypertension, Irritable bowel syndrome (IBS), and Low back pain.  Surgical History Patient  has a past surgical history that includes Tubal ligation; Myomectomy; Lumbar laminectomy; Abdominoplasty (04/2013); and Colonoscopy (02-10-15).  Family History Pateint's family history includes Arthritis in her unknown relative; Diabetes in her father, maternal grandfather, maternal grandmother, mother, paternal grandfather, paternal grandmother, and unknown relative; Hyperlipidemia in her unknown relative; Hypertension in her father, maternal grandfather, maternal grandmother, mother, paternal grandfather, paternal grandmother, and unknown relative; Prostate cancer in her unknown relative; Stroke in her unknown relative.  Social History Patient  reports that she quit smoking about 21  years ago. Her smoking use included cigarettes. She has a 0.50 pack-year smoking history. She has never used smokeless tobacco. She reports that she does not drink alcohol or use drugs.    Objective: Vitals:   10/02/18 1426  BP: 136/86  Pulse: (!) 58  Temp: 97.6 F (36.4 C)  TempSrc: Oral  SpO2: 97%  Weight: 182 lb 12.8 oz (82.9 kg)  Height: 5\' 4"  (1.626 m)    Body mass index is 31.38 kg/m.  Physical Exam  Constitutional: She appears well-developed and well-nourished.  Skin:  Right big toe with no visible or palpable puncture wound. Do not see any break in the skin. No redness, edema, pain on palpation. Pedal pulses intact.   Vitals reviewed.      Assessment/plan: 1. Puncture wound of great toe w/o foreign body w/o damage to nail, right, initial encounter I actually can't appreciate any puncture wound or even break in the skin. Will give her tdap shot today and wound care discussed. I want her to check her foot everyday for any change in skin/watch out for possible infections. Discussed with her that even though she has diabetes I really don't feel like she needs prophylactic antibiotics as I can't even see an actual tear in the skin. Would rather she watch her foot closely and let us know if any changes or concern for infection than give her antibiotics that she doesn't need. She is on board for this. F/u as needed.  - Tdap vaccine greater than or equal to 7yo IM      Return if symptoms worsen or fail to improve.    Orland Mustard, MD [facility]  10/02/2018

## 2018-10-03 ENCOUNTER — Ambulatory Visit: Payer: BC Managed Care – PPO | Admitting: Family Medicine

## 2018-10-27 ENCOUNTER — Ambulatory Visit: Payer: BC Managed Care – PPO | Admitting: Family Medicine

## 2018-11-11 ENCOUNTER — Encounter: Payer: Self-pay | Admitting: Family Medicine

## 2018-11-11 ENCOUNTER — Ambulatory Visit: Payer: BC Managed Care – PPO | Admitting: Family Medicine

## 2018-11-11 ENCOUNTER — Telehealth: Payer: Self-pay | Admitting: Family Medicine

## 2018-11-11 VITALS — BP 124/82 | HR 60 | Temp 97.9°F | Wt 182.0 lb

## 2018-11-11 DIAGNOSIS — M791 Myalgia, unspecified site: Secondary | ICD-10-CM

## 2018-11-11 DIAGNOSIS — E119 Type 2 diabetes mellitus without complications: Secondary | ICD-10-CM | POA: Diagnosis not present

## 2018-11-11 DIAGNOSIS — R300 Dysuria: Secondary | ICD-10-CM | POA: Diagnosis not present

## 2018-11-11 LAB — CK: Total CK: 162 U/L (ref 7–177)

## 2018-11-11 LAB — HEPATIC FUNCTION PANEL
ALK PHOS: 162 U/L — AB (ref 39–117)
ALT: 15 U/L (ref 0–35)
AST: 13 U/L (ref 0–37)
Albumin: 4.7 g/dL (ref 3.5–5.2)
BILIRUBIN DIRECT: 0.2 mg/dL (ref 0.0–0.3)
TOTAL PROTEIN: 7 g/dL (ref 6.0–8.3)
Total Bilirubin: 0.8 mg/dL (ref 0.2–1.2)

## 2018-11-11 LAB — BASIC METABOLIC PANEL
BUN: 14 mg/dL (ref 6–23)
CO2: 28 mEq/L (ref 19–32)
CREATININE: 0.76 mg/dL (ref 0.40–1.20)
Calcium: 9.9 mg/dL (ref 8.4–10.5)
Chloride: 105 mEq/L (ref 96–112)
GFR: 101.33 mL/min (ref >60.00)
GLUCOSE: 124 mg/dL — AB (ref 70–99)
POTASSIUM: 4.5 meq/L (ref 3.5–5.1)
Sodium: 142 mEq/L (ref 135–145)

## 2018-11-11 LAB — HEMOGLOBIN A1C: Hgb A1c MFr Bld: 6.7 % — ABNORMAL HIGH (ref 4.6–6.5)

## 2018-11-11 LAB — POC URINALSYSI DIPSTICK (AUTOMATED)
BILIRUBIN UA: NEGATIVE
Blood, UA: POSITIVE
GLUCOSE UA: NEGATIVE
Ketones, UA: NEGATIVE
LEUKOCYTES UA: NEGATIVE
NITRITE UA: NEGATIVE
PH UA: 6 (ref 5.0–8.0)
Protein, UA: NEGATIVE
Spec Grav, UA: >=1.03 — AB (ref 1.010–1.025)
Urobilinogen, UA: 0.2 E.U./dL

## 2018-11-11 LAB — LIPID PANEL
CHOL/HDL RATIO: 3
CHOLESTEROL: 134 mg/dL (ref 0–200)
HDL: 42.5 mg/dL (ref >39.00)
LDL Cholesterol: 71 mg/dL (ref 0–99)
NonHDL: 91.83
TRIGLYCERIDES: 105 mg/dL (ref 0.0–149.0)
VLDL: 21 mg/dL (ref 0.0–40.0)

## 2018-11-11 MED ORDER — LISINOPRIL 20 MG PO TABS: 20.0000 mg | ORAL_TABLET | Freq: Two times a day (BID) | ORAL | 3 refills | Status: DC

## 2018-11-11 MED ORDER — POTASSIUM CHLORIDE CRYS ER 20 MEQ PO TBCR: 20.0000 meq | EXTENDED_RELEASE_TABLET | Freq: Every day | ORAL | 3 refills | Status: DC

## 2018-11-11 MED ORDER — AMLODIPINE BESYLATE 10 MG PO TABS: 10.0000 mg | ORAL_TABLET | Freq: Every day | ORAL | 3 refills | Status: DC

## 2018-11-11 NOTE — Telephone Encounter (Signed)
Lab results have not been reviewed by provider. Pt will receive phone call with results once these have been reviewed and documented.

## 2018-11-11 NOTE — Telephone Encounter (Signed)
Copied from [place] [phone]. Topic: Quick Communication - See Telephone Encounter >> Nov 11, 2018  4:21 PM Bea Graff, NT wrote: CRM for notification. See Telephone encounter for: 11/11/18. Pt would like a call with her lab results.

## 2018-11-11 NOTE — Progress Notes (Signed)
Subjective:    Patient ID: [name], female    DOB: Mar 16, 1961, 57 y.o.   MRN: [id number]  HPI Here for labs and to ask about aching pains and cramps in her thighs. These started a few weeks ago. They are mainly a problem at night in bed, although they sometimes bother her during the day. She takes one 400 mg magnesium tablet every morning.    Review of Systems  Constitutional: Negative.   Respiratory: Negative.   Cardiovascular: Negative.   Musculoskeletal: Positive for myalgias.  Neurological: Negative.        Objective:   Physical Exam  Constitutional: She is oriented to person, place, and time. She appears well-developed and well-nourished.  Cardiovascular: Normal rate, regular rhythm, normal heart sounds and intact distal pulses.  Pulmonary/Chest: Effort normal and breath sounds normal.  Musculoskeletal: She exhibits no edema or tenderness.  Neurological: She is alert and oriented to person, place, and time.          Assessment & Plan:  Her muscle aches may come from low magnesium, so I suggested she stay on the one magnesium tablet in the mornings but to also add 2 tablets before bedtime. If this does not help in the next 2 weeks, then I would think she is having side effects from her Lipitor. We will check labs to day for her A1c, etc and we will add a CK to these . Gershon Crane, MD

## 2018-11-11 NOTE — Addendum Note (Signed)
Addended by: Alysia Penna A on: 11/11/2018 10:40 AM   Modules accepted: Orders

## 2018-11-17 ENCOUNTER — Encounter: Payer: Self-pay | Admitting: Family Medicine

## 2018-11-17 DIAGNOSIS — R748 Abnormal levels of other serum enzymes: Secondary | ICD-10-CM

## 2018-11-17 NOTE — Telephone Encounter (Signed)
Dr. Fry--pt sent a message in and is concerned about her labs--CK and the red blood cell count.  You have not reviewed these as of yet.  Please advise. Thanks

## 2018-11-18 ENCOUNTER — Encounter: Payer: Self-pay | Admitting: *Deleted

## 2018-11-18 NOTE — Telephone Encounter (Signed)
Tell her that the CK has risen a bit from last time, but it is still in the normal range so no need to worry about it. The alkaline phosphatase level remains slightly elevated. I don't think this means anything is wrong but I will set up an abdominal US to check her gall bladder and common bile duct to rule out any obstructions

## 2018-11-19 NOTE — Telephone Encounter (Signed)
Dr. Sarajane Jews please advise on the magnesium that she is taking?  Thanks

## 2018-11-20 ENCOUNTER — Ambulatory Visit
Admission: RE | Admit: 2018-11-20 | Discharge: 2018-11-20 | Disposition: A | Discharge status: Home / Self Care | Payer: BC Managed Care – PPO | Source: Ambulatory Visit | Attending: Family Medicine | Admitting: Family Medicine

## 2018-11-20 DIAGNOSIS — R748 Abnormal levels of other serum enzymes: Secondary | ICD-10-CM

## 2018-11-21 NOTE — Telephone Encounter (Signed)
Tell her to decrease the magnesium back to the original dose

## 2018-11-25 ENCOUNTER — Ambulatory Visit: Payer: BC Managed Care – PPO | Admitting: Family Medicine

## 2018-11-25 ENCOUNTER — Encounter: Payer: Self-pay | Admitting: Family Medicine

## 2018-11-25 VITALS — BP 110/74 | HR 73 | Temp 98.9°F | Ht 64.0 in | Wt 187.4 lb

## 2018-11-25 DIAGNOSIS — M25552 Pain in left hip: Secondary | ICD-10-CM | POA: Diagnosis not present

## 2018-11-25 DIAGNOSIS — M79601 Pain in right arm: Secondary | ICD-10-CM | POA: Diagnosis not present
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
## 2018-11-25 NOTE — Patient Instructions (Addendum)
Good to see you  Please try the exercises  Please take the vimovo for 5 days straight and then as needed  Please try heat over the lower back  They will call you about the nerve study  Please follow up with me in 3-4 weeks if no better

## 2018-11-26 ENCOUNTER — Encounter: Payer: Self-pay | Admitting: Family Medicine

## 2018-11-26 ENCOUNTER — Encounter: Payer: Self-pay | Admitting: Neurology

## 2018-11-26 DIAGNOSIS — M79601 Pain in right arm: Secondary | ICD-10-CM | POA: Insufficient documentation

## 2018-11-26 DIAGNOSIS — M545 Low back pain, unspecified: Secondary | ICD-10-CM | POA: Insufficient documentation
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
## 2018-11-26 NOTE — Assessment & Plan Note (Signed)
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  Also could be originating from the lower back as she does have facet arthropathy in the lower lumbar segments.  History of laminectomy L3-4.  Does not appear to be neurogenic from the hip joint.  Could be radicular symptoms. -Provided samples of Vimovo. -Counseled on home exercise therapy and supportive care. -If no improvement would consider imaging and injection therapy.

## 2018-11-28 ENCOUNTER — Ambulatory Visit (INDEPENDENT_AMBULATORY_CARE_PROVIDER_SITE_OTHER)
Admission: RE | Admit: 2018-11-28 | Discharge: 2018-11-28 | Disposition: A | Discharge status: Home / Self Care | Payer: BC Managed Care – PPO | Source: Ambulatory Visit | Attending: Family Medicine | Admitting: Family Medicine

## 2018-11-28 ENCOUNTER — Ambulatory Visit: Payer: BC Managed Care – PPO | Admitting: Family Medicine

## 2018-11-28 ENCOUNTER — Encounter: Payer: Self-pay | Admitting: Family Medicine

## 2018-11-28 ENCOUNTER — Ambulatory Visit: Payer: Self-pay

## 2018-11-28 VITALS — BP 134/80 | HR 80 | Resp 16 | Wt 187.0 lb

## 2018-11-28 DIAGNOSIS — M7062 Trochanteric bursitis, left hip: Secondary | ICD-10-CM

## 2018-11-28 NOTE — Progress Notes (Signed)
[name] - 57 y.o. female MRN [id number]  Date of birth: Apr 19, 1961  SUBJECTIVE:  Including CC & ROS.  No chief complaint on file.   [name] is a 57 y.o. female that is following up for her left-sided trochanteric bursitis.  Has not had much improvement up till now.  Has been started on home exercise therapy and provided samples of Vimovo.  Pain is significant.  She does have pain in the lower back as well as some radicular type symptoms..    Review of Systems  Constitutional: Negative for fever.  HENT: Negative for congestion.   Respiratory: Negative for cough.   Cardiovascular: Negative for chest pain.  Gastrointestinal: Negative for abdominal pain.  Musculoskeletal: Positive for back pain.  Skin: Negative for color change.  Neurological: Negative for weakness.  Hematological: Negative for adenopathy.  Psychiatric/Behavioral: Negative for agitation.    HISTORY: Past Medical, Surgical, Social, and Family History Reviewed & Updated per EMR.   Pertinent Historical Findings include:  Past Medical History:  Diagnosis Date  . Allergy    Dr Stevphen [place]  . Cardiac arrhythmia   . Depression   . Diabetes mellitus without complication (HCC)   . Fibroids    uterine sees Dr. Neva Seat in [place]   . Genital warts   . Heart murmur   . Hypertension   . Irritable bowel syndrome (IBS)   . Low back pain    has a herniated lumbar disc sees Dr August Saucer    Past Surgical History:  Procedure Laterality Date  . ABDOMINOPLASTY  04/2013  . COLONOSCOPY  02-10-15   per Dr. Rhea Belton, clear, repeat in 10 yrs   . LUMBAR LAMINECTOMY     L3-4 on 02-24-10 per Dr Otelia Sergeant   . MYOMECTOMY     laser ablation of uterine fibroids per Dr Dareen Piano 2000  . TUBAL LIGATION      Allergies  Allergen Reactions  . Hctz [Hydrochlorothiazide] Other (See Comments)    Blood pressure goes up  . Peanut-Containing Drug Products Anaphylaxis  . Ciprofloxacin Diarrhea  . Other     CT dye or iodine dye  causes tongue swelling   . Shellfish-Derived Products Other (See Comments)     Per allergy test  . Cephalexin Rash    Family History  Problem Relation Age of Onset  . Arthritis Unknown        Family Hx  . Diabetes Unknown        Family Hx 1st degree relative  . Hyperlipidemia Unknown        Family Hx  . Hypertension Unknown        Family Hx  . Prostate cancer Unknown        Family Hx 1st Degree relative <50  . Stroke Unknown        First Degree Female <50  . Diabetes Mother   . Hypertension Mother   . Diabetes Father   . Hypertension Father   . Diabetes Maternal Grandmother   . Hypertension Maternal Grandmother   . Diabetes Maternal Grandfather   . Hypertension Maternal Grandfather   . Diabetes Paternal Grandmother   . Hypertension Paternal Grandmother   . Diabetes Paternal Grandfather   . Hypertension Paternal Grandfather   . Colon cancer Neg Hx   . Breast cancer Neg Hx      Social History   Socioeconomic History  . Marital status: Married    Spouse name: Not on file  . Number of children: Not on  file  . Years of education: Not on file  . Highest education level: Not on file  Occupational History  . Occupation: N/A  Social Needs  . Financial resource strain: Not on file  . Food insecurity:    Worry: Not on file    Inability: Not on file  . Transportation needs:    Medical: Not on file    Non-medical: Not on file  Tobacco Use  . Smoking status: Former Smoker    Packs/day: 0.50    Years: 1.00    Pack years: 0.50    Types: Cigarettes    Last attempt to quit: 12/17/1996    Years since quitting: 21.9  . Smokeless tobacco: Never Used  Substance and Sexual Activity  . Alcohol use: No    Alcohol/week: 0.0 standard drinks  . Drug use: No  . Sexual activity: Yes    Birth control/protection: None  Lifestyle  . Physical activity:    Days per week: Not on file    Minutes per session: Not on file  . Stress: Not on file  Relationships  . Social connections:      Talks on phone: Not on file    Gets together: Not on file    Attends religious service: Not on file    Active member of club or organization: Not on file    Attends meetings of clubs or organizations: Not on file    Relationship status: Not on file  . Intimate partner violence:    Fear of current or ex partner: Not on file    Emotionally abused: Not on file    Physically abused: Not on file    Forced sexual activity: Not on file  Other Topics Concern  . Not on file  Social History Narrative  . Not on file     PHYSICAL EXAM:  VS: BP 134/80   Pulse 80   Resp 16   Wt 187 lb (84.8 kg)   SpO2 97%   BMI 32.10 kg/m  Physical Exam Gen: NAD, alert, cooperative with exam, well-appearing ENT: normal lips, normal nasal mucosa,  Eye: normal EOM, normal conjunctiva and lids CV:  no edema, +2 pedal pulses   Resp: no accessory muscle use, non-labored,  GI: no masses or tenderness, no hernia  Skin: no rashes, no areas of induration  Neuro: normal tone, normal sensation to touch Psych:  normal insight, alert and oriented MSK:  Left hip: Tenderness palpation of the greater trochanter. Some tenderness palpation over the lower paraspinal lumbar muscles. No tenderness palpation of the SI joint. Normal internal and external rotation of the hip. Normal strength resistance with hip flexion Negative straight leg raise. Neurovascular intact   Aspiration/Injection Procedure Note [name] 1961-08-18  Procedure: Injection Indications: Left hip pain  Procedure Details Consent: Risks of procedure as well as the alternatives and risks of each were explained to the (patient/caregiver).  Consent for procedure obtained. Time Out: Verified patient identification, verified procedure, site/side was marked, verified correct patient position, special equipment/implants available, medications/allergies/relevent history reviewed, required imaging and test results available.  Performed.  The  area was cleaned with iodine and alcohol swabs.    The left greater trochanteric bursa was injected using 1 cc's of 40 mg Depo-Medrol and 4 cc's of 0.5 % bupivacaine with a 22 2 1/2" needle.  Ultrasound was used. Images were obtained in trans-views showing the injection.     A sterile dressing was applied.  Patient did tolerate procedure well.  ASSESSMENT & PLAN:   Trochanteric bursitis Has had little improvement with therapies tried today.  May have component that is related to her lower back or more of an intra-articular process. -Greater troch injection today. -Hip x-ray. -Counseled on exercise therapy and supportive care. - If no improvement may need to consider physical therapy, gabapentin, imaging of the lower back or trigger point injections.

## 2018-11-28 NOTE — Patient Instructions (Signed)
Good to see you  Please try ice and heat on the area  Please see me back in 3-4 weeks if no better.

## 2018-12-01 ENCOUNTER — Telehealth: Payer: Self-pay | Admitting: Family Medicine

## 2018-12-01 DIAGNOSIS — M7062 Trochanteric bursitis, left hip: Secondary | ICD-10-CM

## 2018-12-01 NOTE — Telephone Encounter (Signed)
Left VM for patient. If she calls back please have her speak with a nurse/CMA and inform that her hip xrays are normal. The [facility] can report results to patient.   If any questions then please take the best time and phone number to call and I will try to call her back.   Rosemarie Ax, MD [facility] 12/01/2018, 12:47 PM

## 2018-12-01 NOTE — Assessment & Plan Note (Signed)
Has had little improvement with therapies tried today.  May have component that is related to her lower back or more of an intra-articular process. -Greater troch injection today. -Hip x-ray. -Counseled on exercise therapy and supportive care. - If no improvement may need to consider physical therapy, gabapentin, imaging of the lower back or trigger point injections.

## 2018-12-01 NOTE — Telephone Encounter (Signed)
Patient returned call, given results per notes Dr. Raeford Razor below on 12/01/18, patient verbalized understanding. She says she has questions about what to do next. She's been in pain since Friday and says she's iced, taken Ibuprofen and nothing is helping the pain. She says she can be reached anytime at [phone] and asks if she can be called from the office phone, so it will not go directly to the voice mail. She says the number must show as unknown as the reason it is going to voice mail and not ringing.

## 2018-12-02 ENCOUNTER — Ambulatory Visit (INDEPENDENT_AMBULATORY_CARE_PROVIDER_SITE_OTHER)
Admission: RE | Admit: 2018-12-02 | Discharge: 2018-12-02 | Disposition: A | Discharge status: Home / Self Care | Payer: BC Managed Care – PPO | Source: Ambulatory Visit | Attending: Family Medicine | Admitting: Family Medicine

## 2018-12-02 DIAGNOSIS — M7062 Trochanteric bursitis, left hip: Secondary | ICD-10-CM | POA: Diagnosis not present

## 2018-12-02 NOTE — Telephone Encounter (Signed)
Pt called again to let Dr. Raeford Razor know she is still having back pain. She was unable to go to work today. She would like to know what the next step is for her. Please advise. CB# [phone]

## 2018-12-02 NOTE — Telephone Encounter (Signed)
Spoke with patient. Will obtain lumbar films and have her start taking gabapentin. May need to try trigger point injections or intra-articular hip injection.   Rosemarie Ax, MD [facility] 12/02/2018, 2:01 PM

## 2018-12-02 NOTE — Addendum Note (Signed)
Addended by: Rosemarie Ax on: 12/02/2018 02:01 PM   Modules accepted: Orders

## 2018-12-03 ENCOUNTER — Telehealth: Payer: Self-pay | Admitting: Family Medicine

## 2018-12-03 DIAGNOSIS — M545 Low back pain, unspecified: Secondary | ICD-10-CM

## 2018-12-03 NOTE — Telephone Encounter (Signed)
Spoke with patient about her xray results. Will send to PT. After a few weeks of PT and no improvement consider MRI lumbar spine.   Rosemarie Ax, MD [facility] 12/03/2018, 10:12 AM

## 2018-12-03 NOTE — Telephone Encounter (Signed)
Dr. Schmitz please advise? 
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
## 2018-12-05 ENCOUNTER — Ambulatory Visit: Payer: Self-pay

## 2018-12-05 NOTE — Telephone Encounter (Signed)
Pt called stating she is in bad, excrutiating pain. She said that she is using the gabapentin and has tried some stretching that isn't helping. Pt asking for advice on how to help until she is in PT.   Kristopher Oppenheim Friendly [address] [place]  Pt also said that she used [place] at [place] and would like [place] to call them for an urgent referral for her instead of [facility].  Please notify pt of advice for pain and changing PT and getting urgent appt Ph# [phone]

## 2018-12-05 NOTE — Telephone Encounter (Signed)
Dr Fry - FYI. Thanks! 
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
## 2018-12-06 ENCOUNTER — Encounter: Payer: Self-pay | Admitting: Family Medicine

## 2018-12-06 ENCOUNTER — Ambulatory Visit: Payer: BC Managed Care – PPO | Admitting: Family Medicine

## 2018-12-06 VITALS — BP 124/90 | HR 64 | Resp 16 | Wt 184.0 lb

## 2018-12-06 DIAGNOSIS — M545 Low back pain, unspecified: Secondary | ICD-10-CM

## 2018-12-06 NOTE — Assessment & Plan Note (Signed)
Has had ongoing pain in this region.  Seems to have a component associated with the lower back but may also component of nerve impingement with the sciatica.  Unclear is why her pain is so intermittent and ranges in such severity. -Referral to [facility] for physical therapy placed instead of [facility] physical therapy. -Counseled on the regimen of her ibuprofen, Tylenol, and gabapentin. -Counseled on home exercise therapy and supportive care. -Follow-up in 4 weeks.  If no improvement consider MRI of the lumbar spine versus left hip.

## 2018-12-06 NOTE — Patient Instructions (Signed)
Good to see you  Please take the ibuprofen, gabapentin and Tylenol together. You can take ibuprofen wait an hour to gabapentin wait an hour and then take Tylenol.  You can take up to 800 mg of ibuprofen and 650 mg of Tylenol.  You can take up to 300 mg of gabapentin 3 times a day as you tolerate. We will be in touch about the referral to physical therapy. Please try heat on the area. Happy Holidays!!

## 2018-12-06 NOTE — Progress Notes (Signed)
[name] - 57 y.o. female MRN [id number]  Date of birth: 01/25/1961  SUBJECTIVE:  Including CC & ROS.  No chief complaint on file.   [name] is a 57 y.o. female that is following up for her left-sided hip and back pain.  She is received a greater trochanteric injection with limited improvement.  She has had worsening of her pain in this area.  The pain alternates from being in her gluteus and in her lower back.  She also experiences intermittent sciatic type pain going down the left side of her leg.  These have been ongoing for about 4 weeks now.  She is been taken medicines with limited provement.  She has not been using the gabapentin on a regular basis.  Had limited improvement with the prednisone.  Independent review of the left hip x-ray from 12/13 shows no degenerative changes of the joint.  Independent review of the lumbar spine x-ray from 12/17 shows a small slip at L4-5.  Also shows facet arthropathy in the lower lumbar spine.   Review of Systems  Constitutional: Negative for fever.  HENT: Negative for congestion.   Respiratory: Negative for cough.   Cardiovascular: Negative for chest pain.  Gastrointestinal: Negative for abdominal pain.  Musculoskeletal: Positive for back pain and gait problem.  Skin: Negative for color change.  Neurological: Negative for weakness.  Hematological: Negative for adenopathy.  Psychiatric/Behavioral: Negative for agitation.    HISTORY: Past Medical, Surgical, Social, and Family History Reviewed & Updated per EMR.   Pertinent Historical Findings include:  Past Medical History:  Diagnosis Date  . Allergy    Dr Stevphen [place]  . Cardiac arrhythmia   . Depression   . Diabetes mellitus without complication (HCC)   . Fibroids    uterine sees Dr. Neva Seat in [place]   . Genital warts   . Heart murmur   . Hypertension   . Irritable bowel syndrome (IBS)   . Low back pain    has a herniated lumbar disc sees Dr August Saucer    Past  Surgical History:  Procedure Laterality Date  . ABDOMINOPLASTY  04/2013  . COLONOSCOPY  02-10-15   per Dr. Rhea Belton, clear, repeat in 10 yrs   . LUMBAR LAMINECTOMY     L3-4 on 02-24-10 per Dr Otelia Sergeant   . MYOMECTOMY     laser ablation of uterine fibroids per Dr Dareen Piano 2000  . TUBAL LIGATION      Allergies  Allergen Reactions  . Hctz [Hydrochlorothiazide] Other (See Comments)    Blood pressure goes up  . Peanut-Containing Drug Products Anaphylaxis  . Ciprofloxacin Diarrhea  . Other     CT dye or iodine dye causes tongue swelling   . Shellfish-Derived Products Other (See Comments)     Per allergy test  . Cephalexin Rash    Family History  Problem Relation Age of Onset  . Arthritis Unknown        Family Hx  . Diabetes Unknown        Family Hx 1st degree relative  . Hyperlipidemia Unknown        Family Hx  . Hypertension Unknown        Family Hx  . Prostate cancer Unknown        Family Hx 1st Degree relative <50  . Stroke Unknown        First Degree Female <50  . Diabetes Mother   . Hypertension Mother   . Diabetes Father   .  Hypertension Father   . Diabetes Maternal Grandmother   . Hypertension Maternal Grandmother   . Diabetes Maternal Grandfather   . Hypertension Maternal Grandfather   . Diabetes Paternal Grandmother   . Hypertension Paternal Grandmother   . Diabetes Paternal Grandfather   . Hypertension Paternal Grandfather   . Colon cancer Neg Hx   . Breast cancer Neg Hx      Social History   Socioeconomic History  . Marital status: Married    Spouse name: Not on file  . Number of children: Not on file  . Years of education: Not on file  . Highest education level: Not on file  Occupational History  . Occupation: N/A  Social Needs  . Financial resource strain: Not on file  . Food insecurity:    Worry: Not on file    Inability: Not on file  . Transportation needs:    Medical: Not on file    Non-medical: Not on file  Tobacco Use  . Smoking status:  Former Smoker    Packs/day: 0.50    Years: 1.00    Pack years: 0.50    Types: Cigarettes    Last attempt to quit: 12/17/1996    Years since quitting: 21.9  . Smokeless tobacco: Never Used  Substance and Sexual Activity  . Alcohol use: No    Alcohol/week: 0.0 standard drinks  . Drug use: No  . Sexual activity: Yes    Birth control/protection: None  Lifestyle  . Physical activity:    Days per week: Not on file    Minutes per session: Not on file  . Stress: Not on file  Relationships  . Social connections:    Talks on phone: Not on file    Gets together: Not on file    Attends religious service: Not on file    Active member of club or organization: Not on file    Attends meetings of clubs or organizations: Not on file    Relationship status: Not on file  . Intimate partner violence:    Fear of current or ex partner: Not on file    Emotionally abused: Not on file    Physically abused: Not on file    Forced sexual activity: Not on file  Other Topics Concern  . Not on file  Social History Narrative  . Not on file     PHYSICAL EXAM:  VS: BP 124/90   Pulse 64   Resp 16   Wt 184 lb (83.5 kg)   SpO2 98%   BMI 31.58 kg/m  Physical Exam Gen: NAD, alert, cooperative with exam, well-appearing ENT: normal lips, normal nasal mucosa,  Eye: normal EOM, normal conjunctiva and lids CV:  no edema, +2 pedal pulses   Resp: no accessory muscle use, non-labored,  Skin: no rashes, no areas of induration  Neuro: normal tone, normal sensation to touch Psych:  normal insight, alert and oriented MSK:  Back/left hip:  No TTP of the GT, piriformis, SI joint,  Mild TTP of the left lower lumbar muscles.  No midline lumbar spine TTP  Normal IR and ER  Normal strength to resistance with hip flexion, knee flexion and extension, plantarflexion and dorsalflexion.  Negative SLR b/l  Symmetric patellar reflexes  Walking with limp  Neurovascularly intact.       ASSESSMENT & PLAN:   Low  back pain Has had ongoing pain in this region.  Seems to have a component associated with the lower back but may also  component of nerve impingement with the sciatica.  Unclear is why her pain is so intermittent and ranges in such severity. -Referral to [facility] for physical therapy placed instead of [facility] physical therapy. -Counseled on the regimen of her ibuprofen, Tylenol, and gabapentin. -Counseled on home exercise therapy and supportive care. -Follow-up in 4 weeks.  If no improvement consider MRI of the lumbar spine versus left hip.

## 2018-12-08 ENCOUNTER — Encounter: Payer: Self-pay | Admitting: Physical Therapy

## 2018-12-08 ENCOUNTER — Other Ambulatory Visit: Payer: Self-pay

## 2018-12-08 ENCOUNTER — Ambulatory Visit: Payer: BC Managed Care – PPO | Admitting: Family Medicine

## 2018-12-08 ENCOUNTER — Ambulatory Visit: Payer: BC Managed Care – PPO | Attending: Family Medicine | Admitting: Physical Therapy

## 2018-12-08 DIAGNOSIS — M62838 Other muscle spasm: Secondary | ICD-10-CM

## 2018-12-08 DIAGNOSIS — M6281 Muscle weakness (generalized): Secondary | ICD-10-CM | POA: Insufficient documentation

## 2018-12-08 DIAGNOSIS — M5442 Lumbago with sciatica, left side: Secondary | ICD-10-CM

## 2018-12-08 NOTE — Telephone Encounter (Signed)
Spoke with patient during appointment on Saturday 12/21.   Rosemarie Ax, MD [facility] 12/08/2018, 8:38 AM

## 2018-12-08 NOTE — Therapy (Signed)
[facility] [address], [place], [place], [address] Phone: [phone]   Fax:  [phone]  Physical Therapy Treatment  Patient Details  Name: [name] MRN: [id number] Date of Birth: Apr 07, 1961 Referring Provider (PT): Dr. Clearance Coots   Encounter Date: 12/08/2018  PT End of Session - 12/08/18 1526    Visit Number  1    Date for PT Re-Evaluation  01/19/19    Authorization Type  BCBS    PT Start Time  7017    PT Stop Time  1535    PT Time Calculation (min)  50 min    Activity Tolerance  Patient tolerated treatment well;Patient limited by pain    Behavior During Therapy  [facility] for tasks assessed/performed       Past Medical History:  Diagnosis Date  . Allergy    Dr Caprice Red  . Cardiac arrhythmia   . Depression   . Diabetes mellitus without complication ([place])   . Fibroids    uterine sees Dr. Carlota Raspberry in [place]   . Genital warts   . Heart murmur   . Hypertension   . Irritable bowel syndrome (IBS)   . Low back pain    has a herniated lumbar disc sees Dr Marlou Sa    Past Surgical History:  Procedure Laterality Date  . ABDOMINOPLASTY  04/2013  . COLONOSCOPY  02-10-15   per Dr. Hilarie Fredrickson, clear, repeat in 10 yrs   . LUMBAR LAMINECTOMY     L3-4 on 02-24-10 per Dr Louanne Skye   . MYOMECTOMY     laser ablation of uterine fibroids per Dr Ouida Sills 2000  . TUBAL LIGATION      There were no vitals filed for this visit.  Subjective Assessment - 12/08/18 1445    Subjective  Patient was in [place] 04/2018. Past 4 weeks pain has been constant and bad. I cannot bend over. Painful to move.     Patient Stated Goals  figure out how to stop the pain    Currently in Pain?  Yes    Pain Score  10-Worst pain ever    Pain Location  Back    Pain Orientation  Left    Pain Descriptors / Indicators  Burning;Sharp;Stabbing    Pain Type  Acute pain    Pain Radiating Towards  down left leg    Pain Onset  1 to 4 weeks ago     Pain Frequency  Constant    Aggravating Factors   bending over, movement,     Pain Relieving Factors  nothing    Multiple Pain Sites  No         [facility] PT Assessment - 12/08/18 0001      Assessment   Medical Diagnosis  M54.5 Acute left sided low back pain without sciatica    Referring Provider (PT)  Dr. Clearance Coots    Onset Date/Surgical Date  11/08/18    Prior Therapy  none for this      Precautions   Precautions  None      Restrictions   Weight Bearing Restrictions  No      Balance Screen   Has the patient fallen in the past 6 months  No    Has the patient had a decrease in activity level because of a fear of falling?   No    Is the patient reluctant to leave their home because of a fear of falling?   No  [facility] [address], [place], [place], [address] Phone: [phone]   Fax:  [phone]  Physical Therapy Treatment  Patient Details  Name: [name] MRN: [id number] Date of Birth: Apr 07, 1961 Referring Provider (PT): Dr. Clearance Coots   Encounter Date: 12/08/2018  PT End of Session - 12/08/18 1526    Visit Number  1    Date for PT Re-Evaluation  01/19/19    Authorization Type  BCBS    PT Start Time  7017    PT Stop Time  1535    PT Time Calculation (min)  50 min    Activity Tolerance  Patient tolerated treatment well;Patient limited by pain    Behavior During Therapy  [facility] for tasks assessed/performed       Past Medical History:  Diagnosis Date  . Allergy    Dr Caprice Red  . Cardiac arrhythmia   . Depression   . Diabetes mellitus without complication ([place])   . Fibroids    uterine sees Dr. Carlota Raspberry in [place]   . Genital warts   . Heart murmur   . Hypertension   . Irritable bowel syndrome (IBS)   . Low back pain    has a herniated lumbar disc sees Dr Marlou Sa    Past Surgical History:  Procedure Laterality Date  . ABDOMINOPLASTY  04/2013  . COLONOSCOPY  02-10-15   per Dr. Hilarie Fredrickson, clear, repeat in 10 yrs   . LUMBAR LAMINECTOMY     L3-4 on 02-24-10 per Dr Louanne Skye   . MYOMECTOMY     laser ablation of uterine fibroids per Dr Ouida Sills 2000  . TUBAL LIGATION      There were no vitals filed for this visit.  Subjective Assessment - 12/08/18 1445    Subjective  Patient was in [place] 04/2018. Past 4 weeks pain has been constant and bad. I cannot bend over. Painful to move.     Patient Stated Goals  figure out how to stop the pain    Currently in Pain?  Yes    Pain Score  10-Worst pain ever    Pain Location  Back    Pain Orientation  Left    Pain Descriptors / Indicators  Burning;Sharp;Stabbing    Pain Type  Acute pain    Pain Radiating Towards  down left leg    Pain Onset  1 to 4 weeks ago     Pain Frequency  Constant    Aggravating Factors   bending over, movement,     Pain Relieving Factors  nothing    Multiple Pain Sites  No         [facility] PT Assessment - 12/08/18 0001      Assessment   Medical Diagnosis  M54.5 Acute left sided low back pain without sciatica    Referring Provider (PT)  Dr. Clearance Coots    Onset Date/Surgical Date  11/08/18    Prior Therapy  none for this      Precautions   Precautions  None      Restrictions   Weight Bearing Restrictions  No      Balance Screen   Has the patient fallen in the past 6 months  No    Has the patient had a decrease in activity level because of a fear of falling?   No    Is the patient reluctant to leave their home because of a fear of falling?   No  work duties due pain    Clinical Decision Making  Moderate    Rehab Potential  Excellent    Clinical Impairments Affecting Rehab Potential  none    PT Frequency  2x / week    PT Duration  6 weeks    PT Treatment/Interventions  Cryotherapy;Electrical Stimulation;Iontophoresis 4mg /ml Dexamethasone;Moist Heat;Traction;Therapeutic activities;Therapeutic exercise;Neuromuscular re-education;Manual techniques;Patient/family education;Passive range of motion;Dry needling;Taping    PT Next Visit Plan  assess dry needling; kinesiotape lumbar reducing muscle spasms; core strength; soft tissue work, abdominal bracing with movement    Consulted and Agree with Plan of Care  Patient       Patient will benefit from skilled therapeutic intervention in order to improve the following deficits and impairments:  Increased fascial restricitons, Pain, Decreased mobility, Increased muscle spasms, Decreased activity tolerance, Decreased endurance, Decreased range of motion, Decreased strength, Impaired flexibility  Visit Diagnosis: Acute left-sided low back pain with left-sided sciatica - Plan: PT plan of care cert/re-cert  Other muscle spasm - Plan: PT plan of care cert/re-cert  Muscle weakness (generalized) - Plan: PT plan of care cert/re-cert     Problem List Patient Active Problem List   Diagnosis Date Noted  . Right arm pain 11/26/2018  . Low back pain 11/26/2018  . Left knee pain 10/01/2018  . 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 Type 2 diabetes mellitus without complication, without long-term current use of insulin ([place]) 02/22/2017  . Loss of transverse plantar arch 10/31/2016  . Pronation deformity of ankle, acquired 10/31/2016  . Patellofemoral pain syndrome 09/23/2015  . Diabetes mellitus, type II ([place]) 04/27/2015  . OSA (obstructive sleep apnea) 03/31/2014  . Asthma, chronic 08/14/2013  . Leg edema 08/14/2013  . Costochondritis 04/10/2013  .  Osteoarthritis 09/11/2010  . SYNCOPE 07/12/2010  . VAGINITIS, BACTERIAL 06/28/2010  . GERD 03/17/2010  . PALPITATIONS 03/17/2010  . CHEST PAIN 03/17/2010  . ACUTE BRONCHITIS 01/23/2010  . CANDIDIASIS OF VULVA AND VAGINA 01/06/2010  . DEPRESSION, MODERATE, RECURRENT 01/06/2010  . LOW BACK PAIN 01/06/2010  . SCIATICA 08/30/2009  . TROCHANTERIC BURSITIS 08/09/2009  . ABDOMINAL PAIN RIGHT LOWER QUADRANT 01/12/2008  . MORTON'S NEUROMA 09/30/2007  . ETHMOID SINUSITIS 09/30/2007  . LEG PAIN, BILATERAL 09/30/2007  . Abdominal pain, other specified site 09/30/2007  . DEPRESSION 09/22/2007  . Essential hypertension 09/22/2007    Earlie Counts, PT 12/08/18 3:45 PM   [facility] [address] [place], [place], [address] Phone: [phone]   Fax:  [phone]  Name: [name] MRN: [id number] Date of Birth: 04-06-1961  work duties due pain    Clinical Decision Making  Moderate    Rehab Potential  Excellent    Clinical Impairments Affecting Rehab Potential  none    PT Frequency  2x / week    PT Duration  6 weeks    PT Treatment/Interventions  Cryotherapy;Electrical Stimulation;Iontophoresis 4mg /ml Dexamethasone;Moist Heat;Traction;Therapeutic activities;Therapeutic exercise;Neuromuscular re-education;Manual techniques;Patient/family education;Passive range of motion;Dry needling;Taping    PT Next Visit Plan  assess dry needling; kinesiotape lumbar reducing muscle spasms; core strength; soft tissue work, abdominal bracing with movement    Consulted and Agree with Plan of Care  Patient       Patient will benefit from skilled therapeutic intervention in order to improve the following deficits and impairments:  Increased fascial restricitons, Pain, Decreased mobility, Increased muscle spasms, Decreased activity tolerance, Decreased endurance, Decreased range of motion, Decreased strength, Impaired flexibility  Visit Diagnosis: Acute left-sided low back pain with left-sided sciatica - Plan: PT plan of care cert/re-cert  Other muscle spasm - Plan: PT plan of care cert/re-cert  Muscle weakness (generalized) - Plan: PT plan of care cert/re-cert     Problem List Patient Active Problem List   Diagnosis Date Noted  . Right arm pain 11/26/2018  . Low back pain 11/26/2018  . Left knee pain 10/01/2018  . 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 Type 2 diabetes mellitus without complication, without long-term current use of insulin ([place]) 02/22/2017  . Loss of transverse plantar arch 10/31/2016  . Pronation deformity of ankle, acquired 10/31/2016  . Patellofemoral pain syndrome 09/23/2015  . Diabetes mellitus, type II ([place]) 04/27/2015  . OSA (obstructive sleep apnea) 03/31/2014  . Asthma, chronic 08/14/2013  . Leg edema 08/14/2013  . Costochondritis 04/10/2013  .  Osteoarthritis 09/11/2010  . SYNCOPE 07/12/2010  . VAGINITIS, BACTERIAL 06/28/2010  . GERD 03/17/2010  . PALPITATIONS 03/17/2010  . CHEST PAIN 03/17/2010  . ACUTE BRONCHITIS 01/23/2010  . CANDIDIASIS OF VULVA AND VAGINA 01/06/2010  . DEPRESSION, MODERATE, RECURRENT 01/06/2010  . LOW BACK PAIN 01/06/2010  . SCIATICA 08/30/2009  . TROCHANTERIC BURSITIS 08/09/2009  . ABDOMINAL PAIN RIGHT LOWER QUADRANT 01/12/2008  . MORTON'S NEUROMA 09/30/2007  . ETHMOID SINUSITIS 09/30/2007  . LEG PAIN, BILATERAL 09/30/2007  . Abdominal pain, other specified site 09/30/2007  . DEPRESSION 09/22/2007  . Essential hypertension 09/22/2007    Earlie Counts, PT 12/08/18 3:45 PM   [facility] [address] [place], [place], [address] Phone: [phone]   Fax:  [phone]  Name: [name] MRN: [id number] Date of Birth: 04-06-1961

## 2018-12-08 NOTE — Patient Instructions (Signed)

## 2018-12-11 ENCOUNTER — Encounter: Payer: Self-pay | Admitting: Physical Therapy

## 2018-12-11 ENCOUNTER — Ambulatory Visit: Payer: BC Managed Care – PPO | Admitting: Physical Therapy

## 2018-12-11 ENCOUNTER — Telehealth: Payer: Self-pay | Admitting: Family Medicine

## 2018-12-11 DIAGNOSIS — M5442 Lumbago with sciatica, left side: Secondary | ICD-10-CM

## 2018-12-11 DIAGNOSIS — M62838 Other muscle spasm: Secondary | ICD-10-CM

## 2018-12-11 DIAGNOSIS — M6281 Muscle weakness (generalized): Secondary | ICD-10-CM

## 2018-12-11 NOTE — Telephone Encounter (Signed)
Patient needs a letter written stating unable to workout at her gym right now so she can get her membership temporarily suspended.  Patient is in physical therapy.  Please state this could be up to 9 months.

## 2018-12-11 NOTE — Patient Instructions (Addendum)
Access Code: [email or web address]  URL: [email or web address]  Date: 12/11/2018  Prepared by: [facility] [place] Lumbar Spine - 1 reps - 1 sets - 1 min hold - 5 x daily - 7x weekly  Supine Transversus Abdominis Bracing - Hands on Stomach - 10 reps - 1 sets - 5 sec hold - 1x daily - 7x weekly  Supine Alternating Shoulder Flexion - 10 reps - 1 sets - 1x daily - 7x weekly  Patient Education  Lumbar Disk Herniation  Low Back Pain  Rolling From Your Back to Your Side  Sitting Down & Standing Up [facility] [address], [place], [place] [address] Phone # [phone] Fax [phone]

## 2018-12-11 NOTE — Therapy (Signed)
[facility] [address] [place], [place], [address] Phone: [phone]   Fax:  [phone]  Physical Therapy Treatment  Patient Details  Name: [name] MRN: [id number] Date of Birth: Mar 05, 1961 Referring Provider (PT): Dr. Clearance Coots   Encounter Date: 12/11/2018  PT End of Session - 12/11/18 1616    Visit Number  2    Date for PT Re-Evaluation  01/19/19    Authorization Type  BCBS    PT Start Time  1615    PT Stop Time  1708    PT Time Calculation (min)  53 min    Activity Tolerance  Patient tolerated treatment well;Patient limited by pain    Behavior During Therapy  [facility] for tasks assessed/performed       Past Medical History:  Diagnosis Date  . Allergy    Dr Caprice Red  . Cardiac arrhythmia   . Depression   . Diabetes mellitus without complication ([place])   . Fibroids    uterine sees Dr. Carlota Raspberry in [place]   . Genital warts   . Heart murmur   . Hypertension   . Irritable bowel syndrome (IBS)   . Low back pain    has a herniated lumbar disc sees Dr Marlou Sa    Past Surgical History:  Procedure Laterality Date  . ABDOMINOPLASTY  04/2013  . COLONOSCOPY  02-10-15   per Dr. Hilarie Fredrickson, clear, repeat in 10 yrs   . LUMBAR LAMINECTOMY     L3-4 on 02-24-10 per Dr Louanne Skye   . MYOMECTOMY     laser ablation of uterine fibroids per Dr Ouida Sills 2000  . TUBAL LIGATION      There were no vitals filed for this visit.  Subjective Assessment - 12/11/18 1619    Subjective  My left leg feel more swollen. My pain is managable now. I am unable to bend over and pick up stuff. I am unable to sweep. I have the pain after I have over done it. I have practiced breathing with movement to make it easier. When on feet long the pain is more in the back.     Patient Stated Goals  figure out how to stop the pain    Currently in Pain?  Yes    Pain Score  7     Pain Location  Back    Pain Orientation  Left    Pain  Descriptors / Indicators  Burning;Stabbing    Pain Type  Acute pain    Pain Radiating Towards  down left leg    Pain Onset  1 to 4 weeks ago    Pain Frequency  Constant    Aggravating Factors   bending over, movement    Pain Relieving Factors  nothing    Multiple Pain Sites  No                       [facility] Adult PT Treatment/Exercise - 12/11/18 0001      Therapeutic Activites    Therapeutic Activities  Other Therapeutic Activities    Other Therapeutic Activities  log rolling with abdominal bracing; sit to stand with abdominal bracing and hip flexion      Exercises   Exercises  Lumbar      Lumbar Exercises: Aerobic   Nustep  level 2, seat #8, arm #10, 5 minutes      Lumbar Exercises: Supine   Ab Set  10 reps;5 seconds  lift her purse with correct body mechanics with minimal pain    Baseline  --    Time  6    Period  Weeks    Status  New    Target Date  01/19/19            Plan - 12/11/18 1616    Clinical Impression Statement  Patient pain level decreased to 7/10. Patient had no pain after laying prone in c shape with hips to the right and therapist using a belt for over pressure. Patient had a muscle spasm in the left T11-L1 area. Patient feels weakness in the left leg. Patient is working on centralizing her pain. Patient will benefit from skilled therapy to reduce her pain and restore function.     Rehab Potential  Excellent    Clinical Impairments Affecting Rehab Potential  none    PT Frequency  2x / week    PT Duration  6 weeks    PT  Treatment/Interventions  Cryotherapy;Electrical Stimulation;Iontophoresis 4mg /ml Dexamethasone;Moist Heat;Traction;Therapeutic activities;Therapeutic exercise;Neuromuscular re-education;Manual techniques;Patient/family education;Passive range of motion;Dry needling;Taping    PT Next Visit Plan  continue to work on centralizing the lumbar pain with mckenzie method, work on abdominal bracing with arm and leg movement; body mechanics; electrical stimulation    Recommended Other Services  MD signed initial eval    Consulted and Agree with Plan of Care  Patient       Patient will benefit from skilled therapeutic intervention in order to improve the following deficits and impairments:  Increased fascial restricitons, Pain, Decreased mobility, Increased muscle spasms, Decreased activity tolerance, Decreased endurance, Decreased range of motion, Decreased strength, Impaired flexibility  Visit Diagnosis: Acute left-sided low back pain with left-sided sciatica  Other muscle spasm  Muscle weakness (generalized)     Problem List Patient Active Problem List   Diagnosis Date Noted  . Right arm pain 11/26/2018  . Low back pain 11/26/2018  . Left knee pain 10/01/2018  . 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 Type 2 diabetes mellitus without complication, without long-term current use of insulin ([place]) 02/22/2017  . Loss of transverse plantar arch 10/31/2016  . Pronation deformity of ankle, acquired 10/31/2016  . Patellofemoral pain syndrome 09/23/2015  . Diabetes mellitus, type II ([place]) 04/27/2015  . OSA (obstructive sleep apnea) 03/31/2014  . Asthma, chronic 08/14/2013  . Leg edema 08/14/2013  . Costochondritis 04/10/2013  . Osteoarthritis 09/11/2010  . SYNCOPE 07/12/2010  . VAGINITIS, BACTERIAL 06/28/2010  . GERD 03/17/2010  . PALPITATIONS 03/17/2010  . CHEST PAIN 03/17/2010  . ACUTE BRONCHITIS 01/23/2010  . CANDIDIASIS OF VULVA AND VAGINA 01/06/2010  . DEPRESSION, MODERATE, RECURRENT  01/06/2010  . LOW BACK PAIN 01/06/2010  . SCIATICA 08/30/2009  . TROCHANTERIC BURSITIS 08/09/2009  . ABDOMINAL PAIN RIGHT LOWER QUADRANT 01/12/2008  . MORTON'S NEUROMA 09/30/2007  . ETHMOID SINUSITIS 09/30/2007  . LEG PAIN, BILATERAL 09/30/2007  . Abdominal pain, other specified site 09/30/2007  . DEPRESSION 09/22/2007  . Essential hypertension 09/22/2007    Earlie Counts, PT 12/11/18 5:03 PM   [facility] [address], [place], [place], [address] Phone: [phone]   Fax:  [phone]  Name: [name] MRN: [id number] Date of Birth: May 29, 1961  [facility] [address] [place], [place], [address] Phone: [phone]   Fax:  [phone]  Physical Therapy Treatment  Patient Details  Name: [name] MRN: [id number] Date of Birth: Mar 05, 1961 Referring Provider (PT): Dr. Clearance Coots   Encounter Date: 12/11/2018  PT End of Session - 12/11/18 1616    Visit Number  2    Date for PT Re-Evaluation  01/19/19    Authorization Type  BCBS    PT Start Time  1615    PT Stop Time  1708    PT Time Calculation (min)  53 min    Activity Tolerance  Patient tolerated treatment well;Patient limited by pain    Behavior During Therapy  [facility] for tasks assessed/performed       Past Medical History:  Diagnosis Date  . Allergy    Dr Caprice Red  . Cardiac arrhythmia   . Depression   . Diabetes mellitus without complication ([place])   . Fibroids    uterine sees Dr. Carlota Raspberry in [place]   . Genital warts   . Heart murmur   . Hypertension   . Irritable bowel syndrome (IBS)   . Low back pain    has a herniated lumbar disc sees Dr Marlou Sa    Past Surgical History:  Procedure Laterality Date  . ABDOMINOPLASTY  04/2013  . COLONOSCOPY  02-10-15   per Dr. Hilarie Fredrickson, clear, repeat in 10 yrs   . LUMBAR LAMINECTOMY     L3-4 on 02-24-10 per Dr Louanne Skye   . MYOMECTOMY     laser ablation of uterine fibroids per Dr Ouida Sills 2000  . TUBAL LIGATION      There were no vitals filed for this visit.  Subjective Assessment - 12/11/18 1619    Subjective  My left leg feel more swollen. My pain is managable now. I am unable to bend over and pick up stuff. I am unable to sweep. I have the pain after I have over done it. I have practiced breathing with movement to make it easier. When on feet long the pain is more in the back.     Patient Stated Goals  figure out how to stop the pain    Currently in Pain?  Yes    Pain Score  7     Pain Location  Back    Pain Orientation  Left    Pain  Descriptors / Indicators  Burning;Stabbing    Pain Type  Acute pain    Pain Radiating Towards  down left leg    Pain Onset  1 to 4 weeks ago    Pain Frequency  Constant    Aggravating Factors   bending over, movement    Pain Relieving Factors  nothing    Multiple Pain Sites  No                       [facility] Adult PT Treatment/Exercise - 12/11/18 0001      Therapeutic Activites    Therapeutic Activities  Other Therapeutic Activities    Other Therapeutic Activities  log rolling with abdominal bracing; sit to stand with abdominal bracing and hip flexion      Exercises   Exercises  Lumbar      Lumbar Exercises: Aerobic   Nustep  level 2, seat #8, arm #10, 5 minutes      Lumbar Exercises: Supine   Ab Set  10 reps;5 seconds

## 2018-12-11 NOTE — Telephone Encounter (Signed)
Dr Fry please advise. thanks 

## 2018-12-12 NOTE — Telephone Encounter (Signed)
This letter needs to come from the doctor who is treating her, Dr. Clearance Coots

## 2018-12-15 ENCOUNTER — Encounter: Payer: Self-pay | Admitting: Family Medicine

## 2018-12-15 ENCOUNTER — Ambulatory Visit: Payer: BC Managed Care – PPO

## 2018-12-15 DIAGNOSIS — M5442 Lumbago with sciatica, left side: Secondary | ICD-10-CM

## 2018-12-15 DIAGNOSIS — M6281 Muscle weakness (generalized): Secondary | ICD-10-CM

## 2018-12-15 DIAGNOSIS — M62838 Other muscle spasm: Secondary | ICD-10-CM

## 2018-12-15 NOTE — Therapy (Signed)
PT Next Visit Plan  Assess response to dry needling. continue to work on centralizing the lumbar pain with mckenzie method, work on abdominal bracing with arm and leg movement; body mechanics; electrical stimulation    Consulted and Agree with Plan of Care  Patient       Patient will benefit from skilled therapeutic intervention in order to improve the following deficits and impairments:  Increased fascial restricitons, Pain, Decreased mobility, Increased muscle  spasms, Decreased activity tolerance, Decreased endurance, Decreased range of motion, Decreased strength, Impaired flexibility  Visit Diagnosis: Acute left-sided low back pain with left-sided sciatica  Other muscle spasm  Muscle weakness (generalized)     Problem List Patient Active Problem List   Diagnosis Date Noted  . Right arm pain 11/26/2018  . Low back pain 11/26/2018  . Left knee pain 10/01/2018  . 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 Type 2 diabetes mellitus without complication, without long-term current use of insulin ([place]) 02/22/2017  . Loss of transverse plantar arch 10/31/2016  . Pronation deformity of ankle, acquired 10/31/2016  . Patellofemoral pain syndrome 09/23/2015  . Diabetes mellitus, type II ([place]) 04/27/2015  . OSA (obstructive sleep apnea) 03/31/2014  . Asthma, chronic 08/14/2013  . Leg edema 08/14/2013  . Costochondritis 04/10/2013  . Osteoarthritis 09/11/2010  . SYNCOPE 07/12/2010  . VAGINITIS, BACTERIAL 06/28/2010  . GERD 03/17/2010  . PALPITATIONS 03/17/2010  . CHEST PAIN 03/17/2010  . ACUTE BRONCHITIS 01/23/2010  . CANDIDIASIS OF VULVA AND VAGINA 01/06/2010  . DEPRESSION, MODERATE, RECURRENT 01/06/2010  . LOW BACK PAIN 01/06/2010  . SCIATICA 08/30/2009  . TROCHANTERIC BURSITIS 08/09/2009  . ABDOMINAL PAIN RIGHT LOWER QUADRANT 01/12/2008  . MORTON'S NEUROMA 09/30/2007  . ETHMOID SINUSITIS 09/30/2007  . LEG PAIN, BILATERAL 09/30/2007  . Abdominal pain, other specified site 09/30/2007  . DEPRESSION 09/22/2007  . Essential hypertension 09/22/2007    Sigurd Sos, PT 12/15/18 9:35 AM  [facility] [address], [place], [place], [address] Phone: [phone]   Fax:  [phone]  Name: JAELEN GELLERMAN MRN: [id number] Date of Birth: 1961/04/03  [facility] [address], [place], [place], [address] Phone: [phone]   Fax:  [phone]  Physical Therapy Treatment  Patient Details  Name: [name] MRN: [id number] Date of Birth: 1961/03/06 Referring Provider (PT): Dr. Clearance Coots   Encounter Date: 12/15/2018  PT End of Session - 12/15/18 0933    Visit Number  3    Date for PT Re-Evaluation  01/19/19    Authorization Type  BCBS    PT Start Time  [phone]   late and dry needling   PT Stop Time  0944    PT Time Calculation (min)  53 min    Activity Tolerance  Patient tolerated treatment well    Behavior During Therapy  [facility] for tasks assessed/performed       Past Medical History:  Diagnosis Date  . Allergy    Dr Caprice Red  . Cardiac arrhythmia   . Depression   . Diabetes mellitus without complication ([place])   . Fibroids    uterine sees Dr. Carlota Raspberry in [place]   . Genital warts   . Heart murmur   . Hypertension   . Irritable bowel syndrome (IBS)   . Low back pain    has a herniated lumbar disc sees Dr Marlou Sa    Past Surgical History:  Procedure Laterality Date  . ABDOMINOPLASTY  04/2013  . COLONOSCOPY  02-10-15   per Dr. Hilarie Fredrickson, clear, repeat in 10 yrs   . LUMBAR LAMINECTOMY     L3-4 on 02-24-10 per Dr Louanne Skye   . MYOMECTOMY     laser ablation of uterine fibroids per Dr Ouida Sills 2000  . TUBAL LIGATION      There were no vitals filed for this visit.  Subjective Assessment - 12/15/18 0852    Subjective  I am able to do more self-care on the Lt leg.  I find that I have times where I'm not in pain.      Patient Stated Goals  figure out how to stop the pain    Currently in Pain?  Yes    Pain Score  5     Pain Location  Back    Pain Orientation  Left    Pain Descriptors / Indicators  Dull;Aching    Pain Type  Acute pain    Pain Onset  1 to 4 weeks ago    Pain Frequency  Constant    Aggravating Factors   laying down, walking too much,  getting into/out of car or bed    Pain Relieving Factors  I just deal with it                       [facility] Adult PT Treatment/Exercise - 12/15/18 0001      Lumbar Exercises: Aerobic   Nustep  level 2, seat #8, arm #10, 5 minutes      Modalities   Modalities  Electrical Stimulation;Moist Heat      Moist Heat Therapy   Number Minutes Moist Heat  15 Minutes    Moist Heat Location  Lumbar Spine      Electrical Stimulation   Electrical Stimulation Location  lumbar/gluteal    Electrical Stimulation Action  IFC    Electrical Stimulation Parameters  15 minutes    Electrical Stimulation Goals  Pain      Manual Therapy   Manual Therapy  Soft tissue mobilization;Joint mobilization    Manual therapy comments  lumbar paraspinals and  [facility] [address], [place], [place], [address] Phone: [phone]   Fax:  [phone]  Physical Therapy Treatment  Patient Details  Name: [name] MRN: [id number] Date of Birth: 1961/03/06 Referring Provider (PT): Dr. Clearance Coots   Encounter Date: 12/15/2018  PT End of Session - 12/15/18 0933    Visit Number  3    Date for PT Re-Evaluation  01/19/19    Authorization Type  BCBS    PT Start Time  [phone]   late and dry needling   PT Stop Time  0944    PT Time Calculation (min)  53 min    Activity Tolerance  Patient tolerated treatment well    Behavior During Therapy  [facility] for tasks assessed/performed       Past Medical History:  Diagnosis Date  . Allergy    Dr Caprice Red  . Cardiac arrhythmia   . Depression   . Diabetes mellitus without complication ([place])   . Fibroids    uterine sees Dr. Carlota Raspberry in [place]   . Genital warts   . Heart murmur   . Hypertension   . Irritable bowel syndrome (IBS)   . Low back pain    has a herniated lumbar disc sees Dr Marlou Sa    Past Surgical History:  Procedure Laterality Date  . ABDOMINOPLASTY  04/2013  . COLONOSCOPY  02-10-15   per Dr. Hilarie Fredrickson, clear, repeat in 10 yrs   . LUMBAR LAMINECTOMY     L3-4 on 02-24-10 per Dr Louanne Skye   . MYOMECTOMY     laser ablation of uterine fibroids per Dr Ouida Sills 2000  . TUBAL LIGATION      There were no vitals filed for this visit.  Subjective Assessment - 12/15/18 0852    Subjective  I am able to do more self-care on the Lt leg.  I find that I have times where I'm not in pain.      Patient Stated Goals  figure out how to stop the pain    Currently in Pain?  Yes    Pain Score  5     Pain Location  Back    Pain Orientation  Left    Pain Descriptors / Indicators  Dull;Aching    Pain Type  Acute pain    Pain Onset  1 to 4 weeks ago    Pain Frequency  Constant    Aggravating Factors   laying down, walking too much,  getting into/out of car or bed    Pain Relieving Factors  I just deal with it                       [facility] Adult PT Treatment/Exercise - 12/15/18 0001      Lumbar Exercises: Aerobic   Nustep  level 2, seat #8, arm #10, 5 minutes      Modalities   Modalities  Electrical Stimulation;Moist Heat      Moist Heat Therapy   Number Minutes Moist Heat  15 Minutes    Moist Heat Location  Lumbar Spine      Electrical Stimulation   Electrical Stimulation Location  lumbar/gluteal    Electrical Stimulation Action  IFC    Electrical Stimulation Parameters  15 minutes    Electrical Stimulation Goals  Pain      Manual Therapy   Manual Therapy  Soft tissue mobilization;Joint mobilization    Manual therapy comments  lumbar paraspinals and

## 2018-12-15 NOTE — Telephone Encounter (Signed)
Pt came in to check on the status of this letter and tammy advised her of Dr. Barbie Banner recs.

## 2018-12-16 ENCOUNTER — Encounter: Payer: Self-pay | Admitting: Family Medicine

## 2018-12-18 ENCOUNTER — Encounter: Payer: Self-pay | Admitting: Physical Therapy

## 2018-12-18 ENCOUNTER — Ambulatory Visit: Payer: BC Managed Care – PPO | Attending: Family Medicine | Admitting: Physical Therapy

## 2018-12-18 ENCOUNTER — Encounter: Payer: Self-pay | Admitting: Family Medicine

## 2018-12-18 DIAGNOSIS — M5442 Lumbago with sciatica, left side: Secondary | ICD-10-CM | POA: Diagnosis present

## 2018-12-18 DIAGNOSIS — M6281 Muscle weakness (generalized): Secondary | ICD-10-CM

## 2018-12-18 DIAGNOSIS — M62838 Other muscle spasm: Secondary | ICD-10-CM

## 2018-12-18 NOTE — Therapy (Signed)
Plan - 12/18/18 1455    Clinical Impression Statement  Patient able to get in and out of car and bed with 50% greater ease. Patient pain is more in her lumbar instead of left leg so she is now doing lumbar extension. Patient has less trigger points in the lumbar and gluteal area. Patient not able to do alternate arm and leg in prone due to pain but able to left one leg at a time while monitoring pain. Patient will benefit from skilled therapy to for manual therapy, core strength, and McKenzie technique to reduce left LE pain.     Rehab Potential  Excellent    Clinical Impairments Affecting Rehab Potential  none    PT Frequency  2x / week    PT Duration  6 weeks    PT Treatment/Interventions  Cryotherapy;Electrical Stimulation;Iontophoresis 4mg /ml Dexamethasone;Moist Heat;Traction;Therapeutic activities;Therapeutic exercise;Neuromuscular re-education;Manual techniques;Patient/family education;Passive  range of motion;Dry needling;Taping    PT Next Visit Plan   continue to work on centralizing the lumbar pain with mckenzie method using extension;  work on abdominal bracing with arm and leg movement; body mechanics; electrical stimulation    PT Home Exercise Plan  Access Code: [email or web address]     Consulted and Agree with Plan of Care  Patient       Patient will benefit from skilled therapeutic intervention in order to improve the following deficits and impairments:  Increased fascial restricitons, Pain, Decreased mobility, Increased muscle spasms, Decreased activity tolerance, Decreased endurance, Decreased range of motion, Decreased strength, Impaired flexibility  Visit Diagnosis: Acute left-sided low back pain with left-sided sciatica  Other muscle spasm  Muscle weakness (generalized)     Problem List Patient Active Problem List   Diagnosis Date Noted  . Right arm pain 11/26/2018  . Low back pain 11/26/2018  . Left knee pain 10/01/2018  . 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 Type 2 diabetes mellitus without complication, without long-term current use of insulin ([place]) 02/22/2017  . Loss of transverse plantar arch 10/31/2016  . Pronation deformity of ankle, acquired 10/31/2016  . Patellofemoral pain syndrome 09/23/2015  . Diabetes mellitus, type II ([place]) 04/27/2015  . OSA (obstructive sleep apnea) 03/31/2014  . Asthma, chronic 08/14/2013  . Leg edema 08/14/2013  . Costochondritis 04/10/2013  . Osteoarthritis 09/11/2010  . SYNCOPE 07/12/2010  . VAGINITIS, BACTERIAL 06/28/2010  . GERD 03/17/2010  . PALPITATIONS 03/17/2010  . CHEST PAIN 03/17/2010  . ACUTE BRONCHITIS 01/23/2010  . CANDIDIASIS OF VULVA AND VAGINA 01/06/2010  . DEPRESSION, MODERATE, RECURRENT 01/06/2010  . LOW BACK PAIN 01/06/2010  . SCIATICA 08/30/2009  . TROCHANTERIC BURSITIS 08/09/2009  . ABDOMINAL PAIN RIGHT LOWER QUADRANT 01/12/2008  . MORTON'S NEUROMA 09/30/2007  . ETHMOID SINUSITIS 09/30/2007  . LEG  PAIN, BILATERAL 09/30/2007  . Abdominal pain, other specified site 09/30/2007  . DEPRESSION 09/22/2007  . Essential hypertension 09/22/2007    Earlie Counts, PT 12/18/18 3:34 PM   [facility] [address] [place], [place], [address] Phone: [phone]   Fax:  [phone]  Name: SAHMYA ARAI MRN: [id number] Date of Birth: 08/10/1961  [facility] [address], [place], [place], [address] Phone: [phone]   Fax:  [phone]  Physical Therapy Treatment  Patient Details  Name: [name] MRN: [id number] Date of Birth: 17-Oct-1961 Referring Provider (PT): Dr. Clearance Coots   Encounter Date: 12/18/2018  PT End of Session - 12/18/18 1530    Visit Number  4    Date for PT Re-Evaluation  01/19/19    Authorization Type  BCBS    PT Start Time  0245    PT Stop Time  0345    PT Time Calculation (min)  60 min    Activity Tolerance  Patient tolerated treatment well    Behavior During Therapy  [facility] for tasks assessed/performed       Past Medical History:  Diagnosis Date  . Allergy    Dr Caprice Red  . Cardiac arrhythmia   . Depression   . Diabetes mellitus without complication ([place])   . Fibroids    uterine sees Dr. Carlota Raspberry in [place]   . Genital warts   . Heart murmur   . Hypertension   . Irritable bowel syndrome (IBS)   . Low back pain    has a herniated lumbar disc sees Dr Marlou Sa    Past Surgical History:  Procedure Laterality Date  . ABDOMINOPLASTY  04/2013  . COLONOSCOPY  02-10-15   per Dr. Hilarie Fredrickson, clear, repeat in 10 yrs   . LUMBAR LAMINECTOMY     L3-4 on 02-24-10 per Dr Louanne Skye   . MYOMECTOMY     laser ablation of uterine fibroids per Dr Ouida Sills 2000  . TUBAL LIGATION      There were no vitals filed for this visit.  Subjective Assessment - 12/18/18 1452    Subjective  I have had some good days. I went for a walk and did well. Pain is more in the back than the leg. getting in and out of car is 50% better.     Patient Stated Goals  figure out how to stop the pain    Currently in Pain?  Yes    Pain Score  3     Pain Location  Back    Pain Orientation  Left    Pain Descriptors / Indicators  Dull;Aching    Pain Type  Acute pain    Pain Onset  1 to 4 weeks ago    Aggravating Factors   laying down, walking too musch, getting  into/out of car or bed    Pain Relieving Factors  i just deal with it    Multiple Pain Sites  No                       [facility] Adult PT Treatment/Exercise - 12/18/18 0001      Lumbar Exercises: Stretches   Standing Extension  5 reps   pain in lumbar   Press Ups  3 reps   but hurt shoulder     Lumbar Exercises: Supine   Ab Set  10 reps;5 seconds    Clam  20 reps;1 second   with abdominal bracing   Bent Knee Raise  20 reps;1 second   10 times each side; abdominal bracing     Lumbar Exercises: Prone   Straight Leg Raise  10 reps   right /left ; tried alt. shoulder and leg but pain     Modalities   Modalities  Electrical Stimulation;Moist Heat  Plan - 12/18/18 1455    Clinical Impression Statement  Patient able to get in and out of car and bed with 50% greater ease. Patient pain is more in her lumbar instead of left leg so she is now doing lumbar extension. Patient has less trigger points in the lumbar and gluteal area. Patient not able to do alternate arm and leg in prone due to pain but able to left one leg at a time while monitoring pain. Patient will benefit from skilled therapy to for manual therapy, core strength, and McKenzie technique to reduce left LE pain.     Rehab Potential  Excellent    Clinical Impairments Affecting Rehab Potential  none    PT Frequency  2x / week    PT Duration  6 weeks    PT Treatment/Interventions  Cryotherapy;Electrical Stimulation;Iontophoresis 4mg /ml Dexamethasone;Moist Heat;Traction;Therapeutic activities;Therapeutic exercise;Neuromuscular re-education;Manual techniques;Patient/family education;Passive  range of motion;Dry needling;Taping    PT Next Visit Plan   continue to work on centralizing the lumbar pain with mckenzie method using extension;  work on abdominal bracing with arm and leg movement; body mechanics; electrical stimulation    PT Home Exercise Plan  Access Code: [email or web address]     Consulted and Agree with Plan of Care  Patient       Patient will benefit from skilled therapeutic intervention in order to improve the following deficits and impairments:  Increased fascial restricitons, Pain, Decreased mobility, Increased muscle spasms, Decreased activity tolerance, Decreased endurance, Decreased range of motion, Decreased strength, Impaired flexibility  Visit Diagnosis: Acute left-sided low back pain with left-sided sciatica  Other muscle spasm  Muscle weakness (generalized)     Problem List Patient Active Problem List   Diagnosis Date Noted  . Right arm pain 11/26/2018  . Low back pain 11/26/2018  . Left knee pain 10/01/2018  . 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 Type 2 diabetes mellitus without complication, without long-term current use of insulin ([place]) 02/22/2017  . Loss of transverse plantar arch 10/31/2016  . Pronation deformity of ankle, acquired 10/31/2016  . Patellofemoral pain syndrome 09/23/2015  . Diabetes mellitus, type II ([place]) 04/27/2015  . OSA (obstructive sleep apnea) 03/31/2014  . Asthma, chronic 08/14/2013  . Leg edema 08/14/2013  . Costochondritis 04/10/2013  . Osteoarthritis 09/11/2010  . SYNCOPE 07/12/2010  . VAGINITIS, BACTERIAL 06/28/2010  . GERD 03/17/2010  . PALPITATIONS 03/17/2010  . CHEST PAIN 03/17/2010  . ACUTE BRONCHITIS 01/23/2010  . CANDIDIASIS OF VULVA AND VAGINA 01/06/2010  . DEPRESSION, MODERATE, RECURRENT 01/06/2010  . LOW BACK PAIN 01/06/2010  . SCIATICA 08/30/2009  . TROCHANTERIC BURSITIS 08/09/2009  . ABDOMINAL PAIN RIGHT LOWER QUADRANT 01/12/2008  . MORTON'S NEUROMA 09/30/2007  . ETHMOID SINUSITIS 09/30/2007  . LEG  PAIN, BILATERAL 09/30/2007  . Abdominal pain, other specified site 09/30/2007  . DEPRESSION 09/22/2007  . Essential hypertension 09/22/2007    Earlie Counts, PT 12/18/18 3:34 PM   [facility] [address] [place], [place], [address] Phone: [phone]   Fax:  [phone]  Name: SAHMYA ARAI MRN: [id number] Date of Birth: 08/10/1961

## 2018-12-18 NOTE — Patient Instructions (Signed)
Access Code: [email or web address]  URL: [email or web address]  Date: 12/18/2018  Prepared by: [place] Lumbar Spine - 1 reps - 1 sets - 1 min hold - 5 x daily - 7x weekly  Supine Transversus Abdominis Bracing - Hands on Stomach - 10 reps - 1 sets - 5 sec hold - 1x daily - 7x weekly  Supine Alternating Shoulder Flexion - 10 reps - 1 sets - 1x daily - 7x weekly  Standing Lumbar Extension - 10 reps - 1 sets - 4x daily - 7x weekly  Bent Knee Fallouts - 10 reps - 1 sets - 1x daily - 7x weekly  Supine March - 10 reps - 1 sets - 1x daily - 7x weekly  Patient Education  Lumbar Disk Herniation  Low Back Pain  Rolling From Your Back to Your Side  Sitting Down & Standing Up [facility] [address], [place], [place] [address] Phone # [phone] Fax [phone]

## 2018-12-24 ENCOUNTER — Encounter: Payer: Self-pay | Admitting: Physical Therapy

## 2018-12-24 ENCOUNTER — Ambulatory Visit: Payer: BC Managed Care – PPO | Admitting: Physical Therapy

## 2018-12-24 DIAGNOSIS — M62838 Other muscle spasm: Secondary | ICD-10-CM

## 2018-12-24 DIAGNOSIS — M6281 Muscle weakness (generalized): Secondary | ICD-10-CM

## 2018-12-24 DIAGNOSIS — M5442 Lumbago with sciatica, left side: Secondary | ICD-10-CM

## 2018-12-24 NOTE — Progress Notes (Signed)
[name] [place], [place] [address] Phone: [phone] Subjective:   [name], am serving as a scribe for Dr. Hulan Saas.   CC: Back pain  WNU:[id number]  [name] is a 58 y.o. female coming in with complaint of back pain. Patient has been having chronic back pain. Was in [place] in May 2019. Patient has been having dull achy pain until December when she developed severe pain. Was unable to walk or bend over intially. Patient was then seen by Dr. Raeford Razor due to intense pain. Tried an injection which did not help. Was told that a disc was injured at L4/L5. Is doing physical therapy which is helping to alleviate her pain. Pain is now back to a constant dull ache. Does have intermittent left sided radiating pain over anterior leg. Also has anterior hip pain on left side. Pain occurs with sitting and standing. Was having trouble sleeping but that has improved.  Patient still states that it is a pain that seems to be unrelenting and continues to give her difficulty even with such tasks as unloading the dishwasher    X-rays of the lumbar spine were independently visualized by me showing the patient does have progressive degenerative changes at L4-L5 with a 6 mm anterolisthesis and facet arthropathy.  Patient has been to formal physical therapy numerous times  Past Medical History:  Diagnosis Date  . Allergy    Dr Caprice Red  . Cardiac arrhythmia   . Depression   . Diabetes mellitus without complication ([place])   . Fibroids    uterine sees Dr. Carlota Raspberry in [place]   . Genital warts   . Heart murmur   . Hypertension   . Irritable bowel syndrome (IBS)   . Low back pain    has a herniated lumbar disc sees Dr Marlou Sa   Past Surgical History:  Procedure Laterality Date  . ABDOMINOPLASTY  04/2013  . COLONOSCOPY  02-10-15   per Dr. Hilarie Fredrickson, clear, repeat in 10 yrs   . LUMBAR LAMINECTOMY     L3-4 on 02-24-10 per Dr Louanne Skye   . MYOMECTOMY     laser ablation of uterine fibroids per Dr Ouida Sills 2000  . TUBAL LIGATION     Social History   Socioeconomic History  . Marital status: Married    Spouse name: Not on file  . Number of children: Not on file  . Years of education: Not on file  . Highest education level: Not on file  Occupational History  . Occupation: N/A  Social Needs  . Financial resource strain: Not on file  . Food insecurity:    Worry: Not on file    Inability: Not on file  . Transportation needs:    Medical: Not on file    Non-medical: Not on file  Tobacco Use  . Smoking status: Former Smoker    Packs/day: 0.50    Years: 1.00    Pack years: 0.50    Types: Cigarettes    Last attempt to quit: 12/17/1996    Years since quitting: 22.0  . Smokeless tobacco: Never Used  Substance and Sexual Activity  . Alcohol use: No    Alcohol/week: 0.0 standard drinks  . Drug use: No  . Sexual activity: Yes    Birth control/protection: None  Lifestyle  . Physical activity:    Days per week: Not on file    Minutes per session: Not on file  . Stress: Not on file  Relationships  . Social connections:    Talks on phone: Not on file    Gets together: Not on file    Attends religious service: Not on file    Active member of club or organization: Not on file    Attends meetings of clubs or organizations: Not on file    Relationship status: Not on file  Other Topics Concern  . Not on file  Social History Narrative  . Not on file   Allergies  Allergen Reactions  . Hctz [Hydrochlorothiazide] Other (See Comments)    Blood pressure goes up  . Peanut-Containing Drug Products Anaphylaxis  . Ciprofloxacin Diarrhea  . Other     CT dye or iodine dye causes tongue swelling   . Shellfish-Derived Products Other (See Comments)     Per allergy test  . Cephalexin Rash   Family History  Problem Relation Age of Onset  . Arthritis Unknown        Family Hx  . Diabetes Unknown        Family Hx 1st degree relative  .  Hyperlipidemia Unknown        Family Hx  . Hypertension Unknown        Family Hx  . Prostate cancer Unknown        Family Hx 1st Degree relative <50  . Stroke Unknown        First Degree Female <50  . Diabetes Mother   . Hypertension Mother   . Diabetes Father   . Hypertension Father   . Diabetes Maternal Grandmother   . Hypertension Maternal Grandmother   . Diabetes Maternal Grandfather   . Hypertension Maternal Grandfather   . Diabetes Paternal Grandmother   . Hypertension Paternal Grandmother   . Diabetes Paternal Grandfather   . Hypertension Paternal Grandfather   . Colon cancer Neg Hx   . Breast cancer Neg Hx      Current Outpatient Medications (Cardiovascular):  .  amLODipine (NORVASC) 10 MG tablet, Take 1 tablet (10 mg total) by mouth daily. Marland Kitchen  atorvastatin (LIPITOR) 20 MG tablet, TAKE ONE TABLET BY MOUTH DAILY .  lisinopril (PRINIVIL,ZESTRIL) 20 MG tablet, Take 1 tablet (20 mg total) by mouth 2 (two) times daily.  Current Outpatient Medications (Respiratory):  .  albuterol (PROVENTIL HFA;VENTOLIN HFA) 108 (90 Base) MCG/ACT inhaler, Inhale 2 puffs into the lungs every 4 (four) hours as needed for wheezing or shortness of breath.  Current Outpatient Medications (Analgesics):  Marland Kitchen  Ibuprofen-Famotidine 800-26.6 MG TABS, Take 1 tablet by mouth 3 (three) times daily.  Current Outpatient Medications (Hematological):  Marland Kitchen  Cyanocobalamin (VITAMIN B 12) 250 MCG LOZG, Take 250 mg by mouth daily.   Current Outpatient Medications (Other):  .  Apple Cider Vinegar 300 MG TABS, Take 2 tablets by mouth 2 (two) times daily. .  cholecalciferol (VITAMIN D) 1000 UNITS tablet, Take 1,000 Units by mouth daily.  Marland Kitchen  escitalopram (LEXAPRO) 10 MG tablet, Take 10 mg by mouth daily. .  Multiple Vitamins-Minerals (MULTIVITAMIN WITH MINERALS) tablet, Take 1 tablet by mouth daily. .  NON FORMULARY,  .  omeprazole (PRILOSEC) 40 MG capsule, Take 1 capsule (40 mg total) by mouth daily. .  potassium  chloride SA (K-DUR,KLOR-CON) 20 MEQ tablet, Take 1 tablet (20 mEq total) by mouth daily. .  Turmeric 500 MG TABS, Take 1,000 mg by mouth. .  valACYclovir (VALTREX) 500 MG tablet, Take twice daily as needed for herpes virus outbreaks    Past medical history, social,  surgical and family history all reviewed in electronic medical record.  No pertanent information unless stated regarding to the chief complaint.   Review of Systems:  No headache, visual changes, nausea, vomiting, diarrhea, constipation, dizziness, abdominal pain, skin rash, fevers, chills, night sweats, weight loss, swollen lymph nodes, body aches, joint swelling,  chest pain, shortness of breath, mood changes.  Positive muscle aches  Objective  Blood pressure 122/72, pulse 74, height 5\' 4"  (1.626 m), weight 183 lb (83 kg), SpO2 97 %.   General: No apparent distress alert and oriented x3 mood and affect normal, dressed appropriately.  HEENT: Pupils equal, extraocular movements intact  Respiratory: Patient's speak in full sentences and does not appear short of breath  Cardiovascular: No lower extremity edema, non tender, no erythema  Skin: Warm dry intact with no signs of infection or rash on extremities or on axial skeleton.  Abdomen: Soft nontender  Neuro: Cranial nerves II through XII are intact, neurovascularly intact in all extremities with  and 2+ pulses.  Lymph: No lymphadenopathy of posterior or anterior cervical chain or axillae bilaterally.  Gait mild antalgic  MSK:  tender with limited range of motion and good stability and symmetric strength and tone of shoulders, elbows, wrist, hip, knee and ankles bilaterally.   Back Exam:  Inspection: Mild loss of lordosis Motion: Flexion 45 deg, Extension 25 deg, Side Bending to 35 deg bilaterally,  Rotation to 35 deg bilaterally  SLR laying: Positive straight leg on the left side and 20 degrees of forward flexion XSLR laying: Positive left as well Palpable tenderness: .   Tender in the paraspinal musculature lumbar spine FABER: negative. Mild decrease in 1+ DTR on the left Achilles.  Patient does have very mild 4+ out of 5 strength of dorsiflexion on the left side of the foot neurovascular eval intact it appears     Impression and Recommendations:     This case required medical decision making of moderate complexity. The above documentation has been reviewed and is accurate and complete Lyndal Pulley, DO       Note: This dictation was prepared with Dragon dictation along with smaller phrase technology. Any transcriptional errors that result from this process are unintentional.

## 2018-12-24 NOTE — Therapy (Signed)
Clinical Impression Statement  Pt reports 50% improvement in pain overall.  She notes pain has spread across low back into bil hips and is more dull than before.  She tolerated prone press ups on forearms and standing backbends today with no change or some report of relief of pain - it was difficult for her to express whether these positional movements helped.  PT encouraged her to continue trials of extension.  Pt was able to better recruit transversus with cueing of pelvic floor first.  PT demo'd use of towel roll in car for comfort during 1 hour work commute as well as another bending strategy to use at work.  She will continue to benefit from skilled PT along POC to address pain, core weakness and activity tolerance.    Rehab Potential  Excellent    Clinical Impairments Affecting Rehab Potential  none    PT Frequency  2x / week    PT Duration  6 weeks    PT  Treatment/Interventions  Cryotherapy;Electrical Stimulation;Iontophoresis 4mg /ml Dexamethasone;Moist Heat;Traction;Therapeutic activities;Therapeutic exercise;Neuromuscular re-education;Manual techniques;Patient/family education;Passive range of motion;Dry needling;Taping    PT Next Visit Plan   continue to work on centralizing the lumbar pain with mckenzie method using extension;  work on abdominal bracing with arm and leg movement; body mechanics; electrical stimulation    PT Home Exercise Plan  Access Code: [email or web address]     Consulted and Agree with Plan of Care  Patient       Patient will benefit from skilled therapeutic intervention in order to improve the following deficits and impairments:  Increased fascial restricitons, Pain, Decreased mobility, Increased muscle spasms, Decreased activity tolerance, Decreased endurance, Decreased range of motion, Decreased strength, Impaired flexibility  Visit Diagnosis: Acute left-sided low back pain with left-sided sciatica  Other muscle spasm  Muscle weakness (generalized)     Problem List Patient Active Problem List   Diagnosis Date Noted  . Right arm pain 11/26/2018  . Low back pain 11/26/2018  . Left knee pain 10/01/2018  . 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 Type 2 diabetes mellitus without complication, without long-term current use of insulin ([place]) 02/22/2017  . Loss of transverse plantar arch 10/31/2016  . Pronation deformity of ankle, acquired 10/31/2016  . Patellofemoral pain syndrome 09/23/2015  . Diabetes mellitus, type II ([place]) 04/27/2015  . OSA (obstructive sleep apnea) 03/31/2014  . Asthma, chronic 08/14/2013  . Leg edema 08/14/2013  . Costochondritis 04/10/2013  . Osteoarthritis 09/11/2010  . SYNCOPE 07/12/2010  . VAGINITIS, BACTERIAL 06/28/2010  . GERD 03/17/2010  . PALPITATIONS 03/17/2010  . CHEST PAIN 03/17/2010  . ACUTE BRONCHITIS 01/23/2010  . CANDIDIASIS OF VULVA AND VAGINA 01/06/2010  . DEPRESSION, MODERATE,  RECURRENT 01/06/2010  . LOW BACK PAIN 01/06/2010  . SCIATICA 08/30/2009  . TROCHANTERIC BURSITIS 08/09/2009  . ABDOMINAL PAIN RIGHT LOWER QUADRANT 01/12/2008  . MORTON'S NEUROMA 09/30/2007  . ETHMOID SINUSITIS 09/30/2007  . LEG PAIN, BILATERAL 09/30/2007  . Abdominal pain, other specified site 09/30/2007  . DEPRESSION 09/22/2007  . Essential hypertension 09/22/2007    Baruch Merl, PT 12/24/18 5:15 PM   [facility] [address] [place], [place], [address] Phone: [phone]   Fax:  [phone]  Name: KASUMI DITULLIO MRN: [id number] Date of Birth: 10/18/1961  [facility] [address] [place], [place], [address] Phone: [phone]   Fax:  [phone]  Physical Therapy Treatment  Patient Details  Name: [name] MRN: [id number] Date of Birth: 14-Nov-1961 Referring Provider (PT): Dr. Clearance Coots   Encounter Date: 12/24/2018  PT End of Session - 12/24/18 1704    Visit Number  5    Date for PT Re-Evaluation  01/19/19    Authorization Type  BCBS    PT Start Time  0417    PT Stop Time  0507    PT Time Calculation (min)  50 min    Activity Tolerance  Patient tolerated treatment well    Behavior During Therapy  [facility] for tasks assessed/performed       Past Medical History:  Diagnosis Date  . Allergy    Dr Caprice Red  . Cardiac arrhythmia   . Depression   . Diabetes mellitus without complication ([place])   . Fibroids    uterine sees Dr. Carlota Raspberry in [place]   . Genital warts   . Heart murmur   . Hypertension   . Irritable bowel syndrome (IBS)   . Low back pain    has a herniated lumbar disc sees Dr Marlou Sa    Past Surgical History:  Procedure Laterality Date  . ABDOMINOPLASTY  04/2013  . COLONOSCOPY  02-10-15   per Dr. Hilarie Fredrickson, clear, repeat in 10 yrs   . LUMBAR LAMINECTOMY     L3-4 on 02-24-10 per Dr Louanne Skye   . MYOMECTOMY     laser ablation of uterine fibroids per Dr Ouida Sills 2000  . TUBAL LIGATION      There were no vitals filed for this visit.  Subjective Assessment - 12/24/18 1657    Subjective  Pt went back to work this week so pain is a bit more aggravated.  Pt states pain is different now, more dull and more in hip and low back than leg.  Has seemed to spread across low back into both hips.  Pt states pain is about 50% better.    Patient Stated Goals  figure out how to stop the pain    Currently in Pain?  Yes    Pain Score  4     Pain Location  Back    Pain Orientation  Left;Right;Lower    Pain Descriptors / Indicators  Dull;Aching    Pain Type  Acute  pain    Pain Radiating Towards  bil hips    Pain Onset  1 to 4 weeks ago    Pain Frequency  Constant                       [facility] Adult PT Treatment/Exercise - 12/24/18 0001      Self-Care   Self-Care  Other Self-Care Comments    Other Self-Care Comments   bending via single leg extension to maintain neutral lumbar, use of towel roll in sitting for car commute to work      Lumbar Exercises: Standing   Other Standing Lumbar Exercises  trunk backbends 5x 5 sec      Lumbar Exercises: Prone   Straight Leg Raise  5 reps   d/c'd due to pain   Other Prone Lumbar Exercises  prone prop up on forearms x 5 reps, 5 sec    Other Prone Lumbar Exercises  core contractions: pelvic floor, transversus abdominus and deep multifidus via small knee bend, 5 x 5  Clinical Impression Statement  Pt reports 50% improvement in pain overall.  She notes pain has spread across low back into bil hips and is more dull than before.  She tolerated prone press ups on forearms and standing backbends today with no change or some report of relief of pain - it was difficult for her to express whether these positional movements helped.  PT encouraged her to continue trials of extension.  Pt was able to better recruit transversus with cueing of pelvic floor first.  PT demo'd use of towel roll in car for comfort during 1 hour work commute as well as another bending strategy to use at work.  She will continue to benefit from skilled PT along POC to address pain, core weakness and activity tolerance.    Rehab Potential  Excellent    Clinical Impairments Affecting Rehab Potential  none    PT Frequency  2x / week    PT Duration  6 weeks    PT  Treatment/Interventions  Cryotherapy;Electrical Stimulation;Iontophoresis 4mg /ml Dexamethasone;Moist Heat;Traction;Therapeutic activities;Therapeutic exercise;Neuromuscular re-education;Manual techniques;Patient/family education;Passive range of motion;Dry needling;Taping    PT Next Visit Plan   continue to work on centralizing the lumbar pain with mckenzie method using extension;  work on abdominal bracing with arm and leg movement; body mechanics; electrical stimulation    PT Home Exercise Plan  Access Code: [email or web address]     Consulted and Agree with Plan of Care  Patient       Patient will benefit from skilled therapeutic intervention in order to improve the following deficits and impairments:  Increased fascial restricitons, Pain, Decreased mobility, Increased muscle spasms, Decreased activity tolerance, Decreased endurance, Decreased range of motion, Decreased strength, Impaired flexibility  Visit Diagnosis: Acute left-sided low back pain with left-sided sciatica  Other muscle spasm  Muscle weakness (generalized)     Problem List Patient Active Problem List   Diagnosis Date Noted  . Right arm pain 11/26/2018  . Low back pain 11/26/2018  . Left knee pain 10/01/2018  . 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 Type 2 diabetes mellitus without complication, without long-term current use of insulin ([place]) 02/22/2017  . Loss of transverse plantar arch 10/31/2016  . Pronation deformity of ankle, acquired 10/31/2016  . Patellofemoral pain syndrome 09/23/2015  . Diabetes mellitus, type II ([place]) 04/27/2015  . OSA (obstructive sleep apnea) 03/31/2014  . Asthma, chronic 08/14/2013  . Leg edema 08/14/2013  . Costochondritis 04/10/2013  . Osteoarthritis 09/11/2010  . SYNCOPE 07/12/2010  . VAGINITIS, BACTERIAL 06/28/2010  . GERD 03/17/2010  . PALPITATIONS 03/17/2010  . CHEST PAIN 03/17/2010  . ACUTE BRONCHITIS 01/23/2010  . CANDIDIASIS OF VULVA AND VAGINA 01/06/2010  . DEPRESSION, MODERATE,  RECURRENT 01/06/2010  . LOW BACK PAIN 01/06/2010  . SCIATICA 08/30/2009  . TROCHANTERIC BURSITIS 08/09/2009  . ABDOMINAL PAIN RIGHT LOWER QUADRANT 01/12/2008  . MORTON'S NEUROMA 09/30/2007  . ETHMOID SINUSITIS 09/30/2007  . LEG PAIN, BILATERAL 09/30/2007  . Abdominal pain, other specified site 09/30/2007  . DEPRESSION 09/22/2007  . Essential hypertension 09/22/2007    Baruch Merl, PT 12/24/18 5:15 PM   [facility] [address] [place], [place], [address] Phone: [phone]   Fax:  [phone]  Name: KASUMI DITULLIO MRN: [id number] Date of Birth: 10/18/1961

## 2018-12-25 ENCOUNTER — Ambulatory Visit (INDEPENDENT_AMBULATORY_CARE_PROVIDER_SITE_OTHER): Payer: BC Managed Care – PPO | Admitting: Family Medicine

## 2018-12-25 ENCOUNTER — Encounter: Payer: Self-pay | Admitting: Family Medicine

## 2018-12-25 VITALS — BP 122/72 | HR 74 | Ht 64.0 in | Wt 183.0 lb

## 2018-12-25 DIAGNOSIS — M5416 Radiculopathy, lumbar region: Secondary | ICD-10-CM | POA: Diagnosis not present

## 2018-12-25 NOTE — Patient Instructions (Signed)
ood to see you  I am sorry you are hurting We will get MRI of the lumbar spine ordered today call [phone] to schedule Ice 20 minutes 2 times daily. Usually after activity and before bed. COntinue same meds Depending on MRI then wqe will consider injections  I will write you in my chart

## 2018-12-25 NOTE — Assessment & Plan Note (Signed)
Patient has been doing better from the initial injury but seems to have plateaued at this moment.  Patient does have findings of a mild decrease in deep tendon reflexes in patients pain as well as being active has decreased secondary to the pain.  I believe with patient doing physical therapy and failing all other conservative therapy that advanced imaging with an MRI would be beneficial.  Discussed with patient about the possibility of epidurals and patient would likely be a candidate.  Patient will follow-up after imaging and will discuss further.

## 2018-12-28 ENCOUNTER — Ambulatory Visit
Admission: RE | Admit: 2018-12-28 | Discharge: 2018-12-28 | Disposition: A | Discharge status: Home / Self Care | Payer: BC Managed Care – PPO | Source: Ambulatory Visit | Attending: Family Medicine | Admitting: Family Medicine

## 2018-12-28 DIAGNOSIS — M5416 Radiculopathy, lumbar region: Secondary | ICD-10-CM

## 2018-12-29 ENCOUNTER — Encounter: Payer: BC Managed Care – PPO | Admitting: Physical Therapy

## 2018-12-29 ENCOUNTER — Encounter: Payer: Self-pay | Admitting: Family Medicine

## 2018-12-29 ENCOUNTER — Telehealth: Payer: Self-pay | Admitting: *Deleted

## 2018-12-29 NOTE — Telephone Encounter (Signed)
Copied from [place] [id number]. Topic: General - Other >> Dec 26, 2018  3:04 PM Bea Graff, NT wrote: Reason for CRM: Pt states her FMLA forms were suppose to be completed and faxed yesterday and she is checking status. >> Dec 26, 2018  4:16 PM Para Skeans A wrote: Did Dr. Raeford Razor do FMLA for this patient?

## 2018-12-29 NOTE — Telephone Encounter (Signed)
Spoke with pt and she is aware , will mail original copies to her address

## 2018-12-29 NOTE — Telephone Encounter (Signed)
Did you fill out FMLA for this pt?

## 2018-12-29 NOTE — Telephone Encounter (Signed)
Form completed and will fax to [phone]. Will also place copy for patient.   Rosemarie Ax, MD [facility] 12/29/2018, 4:32 PM

## 2018-12-31 ENCOUNTER — Other Ambulatory Visit: Payer: Self-pay

## 2018-12-31 ENCOUNTER — Ambulatory Visit: Payer: BC Managed Care – PPO | Admitting: Physical Therapy

## 2018-12-31 ENCOUNTER — Encounter: Payer: Self-pay | Admitting: Physical Therapy

## 2018-12-31 DIAGNOSIS — M62838 Other muscle spasm: Secondary | ICD-10-CM

## 2018-12-31 DIAGNOSIS — G8929 Other chronic pain: Secondary | ICD-10-CM

## 2018-12-31 DIAGNOSIS — M545 Low back pain, unspecified: Secondary | ICD-10-CM

## 2018-12-31 DIAGNOSIS — M6281 Muscle weakness (generalized): Secondary | ICD-10-CM

## 2018-12-31 DIAGNOSIS — M5442 Lumbago with sciatica, left side: Secondary | ICD-10-CM | POA: Diagnosis not present

## 2018-12-31 NOTE — Therapy (Signed)
[facility] [address], [address] [place], [place], [address] Phone: [phone]   Fax:  [phone]  Physical Therapy Treatment  Patient Details  Name: [name] MRN: [id number] Date of Birth: 10-Jun-1961 Referring Provider (PT): Dr. Clare Gandy   Encounter Date: 12/31/2018  PT End of Session - 12/31/18 1711    Visit Number  6    Date for PT Re-Evaluation  01/19/19    Authorization Type  BCBS    PT Start Time  1620   Pt late   PT Stop Time  1705    PT Time Calculation (min)  45 min    Activity Tolerance  Patient tolerated treatment well    Behavior During Therapy  [facility] for tasks assessed/performed       Past Medical History:  Diagnosis Date  . Allergy    Dr Stevphen [place]  . Cardiac arrhythmia   . Depression   . Diabetes mellitus without complication (HCC)   . Fibroids    uterine sees Dr. Neva Seat in [place]   . Genital warts   . Heart murmur   . Hypertension   . Irritable bowel syndrome (IBS)   . Low back pain    has a herniated lumbar disc sees Dr August Saucer    Past Surgical History:  Procedure Laterality Date  . ABDOMINOPLASTY  04/2013  . COLONOSCOPY  02-10-15   per Dr. Rhea Belton, clear, repeat in 10 yrs   . LUMBAR LAMINECTOMY     L3-4 on 02-24-10 per Dr Otelia Sergeant   . MYOMECTOMY     laser ablation of uterine fibroids per Dr Dareen Piano 2000  . TUBAL LIGATION      There were no vitals filed for this visit.  Subjective Assessment - 12/31/18 1630    Subjective  Pt had an MRI and the MD has recommended steroid injections which will be scheduled soon.  Pt reports 50% improvement in pain since starting PT.     Patient Stated Goals  figure out how to stop the pain    Pain Score  7     Pain Location  Back    Pain Orientation  Posterior;Lower;Left;Right    Pain Descriptors / Indicators  Aching;Dull    Pain Onset  1 to 4 weeks ago    Pain Frequency  Constant                       [facility] Adult PT  Treatment/Exercise - 12/31/18 0001      Exercises   Exercises  Lumbar;Knee/Hip      Lumbar Exercises: Aerobic   UBE (Upper Arm Bike)  L2 x 5' alt fwd/bwd each min, standing      Lumbar Exercises: Standing   Other Standing Lumbar Exercises  shoulder flexion bil UEs 1# on foam x 10 reps    Other Standing Lumbar Exercises  body blade at 90 deg vertically and horizontally x 20 sec each with core cueing      Lumbar Exercises: Supine   Heel Slides  10 reps    Heel Slides Limitations  PT verbal cueing for neutral pelvis and core    Bridge  10 reps    Bridge Limitations  PT provided tactile cues for core and pelvic tilt maintained during bridge,  "peel spine one vertebra at a time off table"      Lumbar Exercises: Quadruped   Madcat/Old Horse  5 reps    Single Arm Raise  5  reps;Left;Right    Straight Leg Raise  5 reps;2 seconds   both   Opposite Arm/Leg Raise  Right arm/Left leg;5 reps    Opposite Arm/Leg Raise Limitations  attempted but poor control so d/c'd      Knee/Hip Exercises: Supine   Straight Leg Raises  Strengthening;Both;5 sets;1 set   Pt reported Rt LBP with Rt SLR            PT Education - 12/31/18 1710    Education Details  Access code: [email or web address]    Person(s) Educated  Patient    Methods  Explanation;Demonstration;Verbal cues;Tactile cues    Comprehension  Verbalized understanding;Returned demonstration;Tactile cues required       PT Short Term Goals - 12/31/18 1715      PT SHORT TERM GOAL #2   Title  understand correct body mechanics with daily tasks to reduce strain on lumbar    Status  On-going   improving core awareness to support improved posture     PT SHORT TERM GOAL #3   Title  able to sit with using a lumbar roll with pain level </= 5/10    Status  Achieved        PT Long Term Goals - 12/31/18 1716      PT LONG TERM GOAL #3   Title  able to sit for 60 minute car ride with minimal back pain so she is able to go to work    Status  On-going             Plan - 12/31/18 1712    Clinical Impression Statement  Pt with excessive lumbar lordosis with poor understanding of deep core recruitment.  PT provided tactile and verbal cues to downtrain tendency to "bear down" through abdominals during core training today.  PT attempted to work with Pt on proper standing posture alignement but Pt reported increased low back pain when cued to release overactive gluteals in standing.  This is likely a compensatory strategy for weak core.  Pt with improved awareness of core during standing ther ex with UE overlay with dumbbells and body blade end of session.  Pt will continue to benefit from skilled PT to address deficits and pain along POC.    Rehab Potential  Excellent    PT Frequency  2x / week    PT Duration  6 weeks    PT Treatment/Interventions  Cryotherapy;Electrical Stimulation;Iontophoresis 4mg /ml Dexamethasone;Moist Heat;Traction;Therapeutic activities;Therapeutic exercise;Neuromuscular re-education;Manual techniques;Patient/family education;Passive range of motion;Dry needling;Taping    PT Next Visit Plan   continue to work on centralizing the lumbar pain with mckenzie method using extension;  work on abdominal bracing with arm and leg movement; body mechanics; electrical stimulation    PT Home Exercise Plan  Access Code: [email or web address]     Consulted and Agree with Plan of Care  Patient       Patient will benefit from skilled therapeutic intervention in order to improve the following deficits and impairments:  Increased fascial restricitons, Pain, Decreased mobility, Increased muscle spasms, Decreased activity tolerance, Decreased endurance, Decreased range of motion, Decreased strength, Impaired flexibility  Visit Diagnosis: Acute left-sided low back pain with left-sided sciatica  Other muscle spasm  Muscle weakness (generalized)     Problem List Patient Active Problem List   Diagnosis Date Noted  . Left lumbar radiculopathy  12/25/2018  . Right arm pain 11/26/2018  . Low back pain 11/26/2018  . Left knee pain 10/01/2018  . 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 Type  2 diabetes mellitus without complication, without long-term current use of insulin (HCC) 02/22/2017  . Loss of transverse plantar arch 10/31/2016  . Pronation deformity of ankle, acquired 10/31/2016  . Patellofemoral pain syndrome 09/23/2015  . Diabetes mellitus, type II (HCC) 04/27/2015  . OSA (obstructive sleep apnea) 03/31/2014  . Asthma, chronic 08/14/2013  . Leg edema 08/14/2013  . Costochondritis 04/10/2013  . Osteoarthritis 09/11/2010  . SYNCOPE 07/12/2010  . VAGINITIS, BACTERIAL 06/28/2010  . GERD 03/17/2010  . PALPITATIONS 03/17/2010  . CHEST PAIN 03/17/2010  . ACUTE BRONCHITIS 01/23/2010  . CANDIDIASIS OF VULVA AND VAGINA 01/06/2010  . DEPRESSION, MODERATE, RECURRENT 01/06/2010  . LOW BACK PAIN 01/06/2010  . SCIATICA 08/30/2009  . TROCHANTERIC BURSITIS 08/09/2009  . ABDOMINAL PAIN RIGHT LOWER QUADRANT 01/12/2008  . MORTON'S NEUROMA 09/30/2007  . ETHMOID SINUSITIS 09/30/2007  . LEG PAIN, BILATERAL 09/30/2007  . Abdominal pain, other specified site 09/30/2007  . DEPRESSION 09/22/2007  . Essential hypertension 09/22/2007    Morton Peters, PT 12/31/18 5:19 PM   [facility] [address], [address] [place], [place], [address] Phone: [phone]   Fax:  [phone]  Name: TIAYANA KUYPER MRN: [id number] Date of Birth: Nov 24, 1961

## 2018-12-31 NOTE — Patient Instructions (Signed)
Proper core recruitment pattern via pelvic floor first, then transversus, avoidance of bearing down with abdominals

## 2019-01-01 ENCOUNTER — Ambulatory Visit: Payer: BC Managed Care – PPO

## 2019-01-05 ENCOUNTER — Ambulatory Visit: Payer: BC Managed Care – PPO

## 2019-01-05 DIAGNOSIS — M5442 Lumbago with sciatica, left side: Secondary | ICD-10-CM

## 2019-01-05 DIAGNOSIS — M6281 Muscle weakness (generalized): Secondary | ICD-10-CM

## 2019-01-05 DIAGNOSIS — M62838 Other muscle spasm: Secondary | ICD-10-CM

## 2019-01-05 NOTE — Therapy (Signed)
[facility] [address] [place], [place], [address] Phone: [phone]   Fax:  [phone]  Physical Therapy Treatment  Patient Details  Name: [name] MRN: [id number] Date of Birth: Sep 02, 1961 Referring Provider (PT): Dr. Clearance Coots   Encounter Date: 01/05/2019  PT End of Session - 01/05/19 1653    Visit Number  7    Date for PT Re-Evaluation  01/19/19    Authorization Type  BCBS    PT Start Time  1618    PT Stop Time  1652    PT Time Calculation (min)  34 min    Activity Tolerance  Patient limited by fatigue;Patient tolerated treatment well    Behavior During Therapy  [facility] for tasks assessed/performed       Past Medical History:  Diagnosis Date  . Allergy    Dr Caprice Red  . Cardiac arrhythmia   . Depression   . Diabetes mellitus without complication ([place])   . Fibroids    uterine sees Dr. Carlota Raspberry in [place]   . Genital warts   . Heart murmur   . Hypertension   . Irritable bowel syndrome (IBS)   . Low back pain    has a herniated lumbar disc sees Dr Marlou Sa    Past Surgical History:  Procedure Laterality Date  . ABDOMINOPLASTY  04/2013  . COLONOSCOPY  02-10-15   per Dr. Hilarie Fredrickson, clear, repeat in 10 yrs   . LUMBAR LAMINECTOMY     L3-4 on 02-24-10 per Dr Louanne Skye   . MYOMECTOMY     laser ablation of uterine fibroids per Dr Ouida Sills 2000  . TUBAL LIGATION      There were no vitals filed for this visit.  Subjective Assessment - 01/05/19 1621    Subjective  I worked today but no kids so I was able to sit.  My pain is better today.      Patient Stated Goals  figure out how to stop the pain    Currently in Pain?  Yes    Pain Score  1     Pain Location  Back    Pain Descriptors / Indicators  Aching;Dull    Pain Onset  More than a month ago    Pain Frequency  Constant    Aggravating Factors   walking too much, getting in/out of bed or car    Pain Relieving Factors  moving around because i  get locked up.                         [facility] Adult PT Treatment/Exercise - 01/05/19 0001      Lumbar Exercises: Stretches   Active Hamstring Stretch  Left;Right;2 reps;20 seconds      Lumbar Exercises: Aerobic   UBE (Upper Arm Bike)  L2 x 5' alt fwd/bwd each min, standing   PT present to discuss progress     Lumbar Exercises: Standing   Other Standing Lumbar Exercises  shoulder flexion bil UEs 1# on foam 2 x10 reps    Other Standing Lumbar Exercises  body blade at 90 deg vertically and horizontally x 20 sec each with core cueing      Lumbar Exercises: Supine   Bridge  10 reps    Bridge Limitations  PT provided tactile cues for core and pelvic tilt maintained during bridge,  "peel spine one vertebra at a time off table"      Lumbar Exercises: Prone  of insulin ([place]) 02/22/2017  . Loss of transverse plantar arch  10/31/2016  . Pronation deformity of ankle, acquired 10/31/2016  . Patellofemoral pain syndrome 09/23/2015  . Diabetes mellitus, type II ([place]) 04/27/2015  . OSA (obstructive sleep apnea) 03/31/2014  . Asthma, chronic 08/14/2013  . Leg edema 08/14/2013  . Costochondritis 04/10/2013  . Osteoarthritis 09/11/2010  . SYNCOPE 07/12/2010  . VAGINITIS, BACTERIAL 06/28/2010  . GERD 03/17/2010  . PALPITATIONS 03/17/2010  . CHEST PAIN 03/17/2010  . ACUTE BRONCHITIS 01/23/2010  . CANDIDIASIS OF VULVA AND VAGINA 01/06/2010  . DEPRESSION, MODERATE, RECURRENT 01/06/2010  . LOW BACK PAIN 01/06/2010  . SCIATICA 08/30/2009  . TROCHANTERIC BURSITIS 08/09/2009  . ABDOMINAL PAIN RIGHT LOWER QUADRANT 01/12/2008  . MORTON'S NEUROMA 09/30/2007  . ETHMOID SINUSITIS 09/30/2007  . LEG PAIN, BILATERAL 09/30/2007  . Abdominal pain, other specified site 09/30/2007  . DEPRESSION 09/22/2007  . Essential hypertension 09/22/2007    Sigurd Sos, PT 01/05/19 4:54 PM  [facility] [address], [place], [place], [address] Phone: [phone]   Fax:  [phone]  Name: [name] MRN: [id number] Date of Birth: 09/04/1961  of insulin ([place]) 02/22/2017  . Loss of transverse plantar arch  10/31/2016  . Pronation deformity of ankle, acquired 10/31/2016  . Patellofemoral pain syndrome 09/23/2015  . Diabetes mellitus, type II ([place]) 04/27/2015  . OSA (obstructive sleep apnea) 03/31/2014  . Asthma, chronic 08/14/2013  . Leg edema 08/14/2013  . Costochondritis 04/10/2013  . Osteoarthritis 09/11/2010  . SYNCOPE 07/12/2010  . VAGINITIS, BACTERIAL 06/28/2010  . GERD 03/17/2010  . PALPITATIONS 03/17/2010  . CHEST PAIN 03/17/2010  . ACUTE BRONCHITIS 01/23/2010  . CANDIDIASIS OF VULVA AND VAGINA 01/06/2010  . DEPRESSION, MODERATE, RECURRENT 01/06/2010  . LOW BACK PAIN 01/06/2010  . SCIATICA 08/30/2009  . TROCHANTERIC BURSITIS 08/09/2009  . ABDOMINAL PAIN RIGHT LOWER QUADRANT 01/12/2008  . MORTON'S NEUROMA 09/30/2007  . ETHMOID SINUSITIS 09/30/2007  . LEG PAIN, BILATERAL 09/30/2007  . Abdominal pain, other specified site 09/30/2007  . DEPRESSION 09/22/2007  . Essential hypertension 09/22/2007    Sigurd Sos, PT 01/05/19 4:54 PM  [facility] [address], [place], [place], [address] Phone: [phone]   Fax:  [phone]  Name: [name] MRN: [id number] Date of Birth: 09/04/1961

## 2019-01-06 ENCOUNTER — Encounter: Payer: Self-pay | Admitting: Family Medicine

## 2019-01-06 NOTE — Progress Notes (Signed)
Phone call to patient to review instructions for 13 hr prep for lumbar epidural injection on 01/07/2019 at 0900. Prescription called into [facility] at [place]. Pt aware and verbalized understanding of instructions. Prescription: 2000- 50mg  Prednisone 0200- 50mg  Prednisone 0800- 50mg  Prednisone and 50mg  Benadryl

## 2019-01-07 ENCOUNTER — Ambulatory Visit
Admission: RE | Admit: 2019-01-07 | Discharge: 2019-01-07 | Disposition: A | Discharge status: Home / Self Care | Payer: BC Managed Care – PPO | Source: Ambulatory Visit | Attending: Family Medicine | Admitting: Family Medicine

## 2019-01-07 ENCOUNTER — Ambulatory Visit: Payer: BC Managed Care – PPO | Admitting: Physical Therapy

## 2019-01-07 DIAGNOSIS — G8929 Other chronic pain: Secondary | ICD-10-CM

## 2019-01-07 DIAGNOSIS — M545 Low back pain: Principal | ICD-10-CM

## 2019-01-07 MED ORDER — IOPAMIDOL (ISOVUE-M 200) INJECTION 41%: 1.0000 mL | Freq: Once | INTRAMUSCULAR | Status: DC

## 2019-01-07 MED ORDER — METHYLPREDNISOLONE ACETATE 40 MG/ML INJ SUSP (RADIOLOG: 120.0000 mg | Freq: Once | INTRAMUSCULAR | Status: DC

## 2019-01-07 NOTE — Progress Notes (Signed)
Pt requested [facility]SB[facility] check. [facility]SB[facility] 180.

## 2019-01-07 NOTE — Discharge Instructions (Signed)

## 2019-01-12 ENCOUNTER — Ambulatory Visit: Payer: BC Managed Care – PPO | Admitting: Physical Therapy

## 2019-01-13 ENCOUNTER — Telehealth: Payer: Self-pay | Admitting: Family Medicine

## 2019-01-13 NOTE — Telephone Encounter (Signed)
Spoke with patient about her work note. Provided an excuse from 12/10 to 1/8 due to pain related to her back.   Rosemarie Ax, MD [facility] 01/13/2019, 3:55 PM

## 2019-01-14 ENCOUNTER — Encounter: Payer: Self-pay | Admitting: Physical Therapy

## 2019-01-14 ENCOUNTER — Ambulatory Visit: Payer: BC Managed Care – PPO | Admitting: Physical Therapy

## 2019-01-14 DIAGNOSIS — M5442 Lumbago with sciatica, left side: Secondary | ICD-10-CM

## 2019-01-14 DIAGNOSIS — M6281 Muscle weakness (generalized): Secondary | ICD-10-CM

## 2019-01-14 NOTE — Therapy (Addendum)
[facility] [address], [place], [place], [address] Phone: [phone]   Fax:  [phone]  Physical Therapy Treatment  Patient Details  Name: [name] MRN: [id number] Date of Birth: 31-Aug-1961 Referring Provider (PT): Dr. Clearance Coots   Encounter Date: 01/14/2019  PT End of Session - 01/14/19 1634    Visit Number  8    Date for PT Re-Evaluation  01/19/19    Authorization Type  BCBS    PT Start Time  1625   Pt late   PT Stop Time  1655    PT Time Calculation (min)  30 min    Activity Tolerance  Patient limited by fatigue;Patient tolerated treatment well    Behavior During Therapy  [facility] for tasks assessed/performed       Past Medical History:  Diagnosis Date  . Allergy    Dr Caprice Red  . Cardiac arrhythmia   . Depression   . Diabetes mellitus without complication ([place])   . Fibroids    uterine sees Dr. Carlota Raspberry in [place]   . Genital warts   . Heart murmur   . Hypertension   . Irritable bowel syndrome (IBS)   . Low back pain    has a herniated lumbar disc sees Dr Marlou Sa    Past Surgical History:  Procedure Laterality Date  . ABDOMINOPLASTY  04/2013  . COLONOSCOPY  02-10-15   per Dr. Hilarie Fredrickson, clear, repeat in 10 yrs   . LUMBAR LAMINECTOMY     L3-4 on 02-24-10 per Dr Louanne Skye   . MYOMECTOMY     laser ablation of uterine fibroids per Dr Ouida Sills 2000  . TUBAL LIGATION      There were no vitals filed for this visit.  Subjective Assessment - 01/14/19 1632    Subjective  Pt states she is all better since getting an epidural steroid injection last week.  She says she is more aware of safety in how she moves and in using her core.  She would like to be discharged and will come back if the shot wears off.    Patient Stated Goals  figure out how to stop the pain    Currently in Pain?  No/denies         [facility] PT Assessment - 01/14/19 0001      Assessment   Medical Diagnosis  M54.5 Acute left  sided low back pain without sciatica    Referring Provider (PT)  Dr. Clearance Coots    Onset Date/Surgical Date  11/08/18    Prior Therapy  none for this      Observation/Other Assessments   Focus on Therapeutic Outcomes (FOTO)   23%      AROM   Overall AROM Comments  WFL without pain      Strength   Overall Strength Comments  grossly 4+/5 bil LEs                   [facility] Adult PT Treatment/Exercise - 01/14/19 0001      Exercises   Exercises  Lumbar      Lumbar Exercises: Aerobic   UBE (Upper Arm Bike)  L2 x 10' alt fwd/bwd each min      Lumbar Exercises: Standing   Other Standing Lumbar Exercises  reactive isometrics bil shoulder flexion, extension, sidestepping yellow tband 5x10 sec holds each    Other Standing Lumbar Exercises  body blade bil LEs on black foam pad x 2' varying UE angles  PT Short Term Goals - 01/05/19 1623      PT SHORT TERM GOAL #1   Title  ind with initial HEP    Status  Achieved      PT SHORT TERM GOAL #2   Title  understand correct body mechanics with daily tasks to reduce strain on lumbar    Status  Achieved      PT SHORT TERM GOAL #3   Title  able to sit with using a lumbar roll with pain level </= 5/10    Status  Achieved        PT Long Term Goals - 01/14/19 1634      PT LONG TERM GOAL #1   Title  ind with advanced HEP    Status  Achieved      PT LONG TERM GOAL #2   Title  ability to dress herself with minimal discomfort due to improved mobility of lumbar spine >/= 50%    Status  Achieved      PT LONG TERM GOAL #3   Title  able to sit for 60 minute car ride with minimal back pain so she is able to go to work    Status  Achieved      PT LONG TERM GOAL #4   Title  FOTO < or = to 56% limited    Status  --   23% 01/14/19     PT LONG TERM GOAL #5   Title  able to lift her purse with correct body mechanics with minimal pain    Status  Achieved            Plan - 01/14/19 1656    Clinical  Impression Statement  Pt arrived today with request to discharge PT due to being painfree after getting an ESI last week.  Pt has met all LTGs and FOTO score reduced significantly (to 23%) from initial evaluation.  PT discussed with her how important her HEP will be in maintaining improved core and posture in the event that the shot wears off.  PT added new standing core exercises via reactive isometrics today and explained that she may return with a new Rx for PT if her pain returns.    Rehab Potential  Excellent    PT Frequency  2x / week    PT Duration  6 weeks    PT Treatment/Interventions  Cryotherapy;Electrical Stimulation;Iontophoresis 4mg /ml Dexamethasone;Moist Heat;Traction;Therapeutic activities;Therapeutic exercise;Neuromuscular re-education;Manual techniques;Patient/family education;Passive range of motion;Dry needling;Taping    PT Next Visit Plan  d/c to HEP with return as needed    PT Home Exercise Plan  Access Code: [email or web address]     Consulted and Agree with Plan of Care  Patient       Patient will benefit from skilled therapeutic intervention in order to improve the following deficits and impairments:  Increased fascial restricitons, Pain, Decreased mobility, Increased muscle spasms, Decreased activity tolerance, Decreased endurance, Decreased range of motion, Decreased strength, Impaired flexibility  Visit Diagnosis: Acute left-sided low back pain with left-sided sciatica  Muscle weakness (generalized)     Problem List Patient Active Problem List   Diagnosis Date Noted  . Left lumbar radiculopathy 12/25/2018  . Right arm pain 11/26/2018  . Low back pain 11/26/2018  . Left knee pain 10/01/2018  . 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 Type 2 diabetes mellitus without complication, without long-term current use of insulin (HCC) 02/22/2017  . Loss of transverse plantar arch 10/31/2016  . Pronation deformity of ankle, acquired  10/31/2016  . Patellofemoral pain syndrome  09/23/2015  . Diabetes mellitus, type II (HCC) 04/27/2015  . OSA (obstructive sleep apnea) 03/31/2014  . Asthma, chronic 08/14/2013  . Leg edema 08/14/2013  . Costochondritis 04/10/2013  . Osteoarthritis 09/11/2010  . SYNCOPE 07/12/2010  . VAGINITIS, BACTERIAL 06/28/2010  . GERD 03/17/2010  . PALPITATIONS 03/17/2010  . CHEST PAIN 03/17/2010  . ACUTE BRONCHITIS 01/23/2010  . CANDIDIASIS OF VULVA AND VAGINA 01/06/2010  . DEPRESSION, MODERATE, RECURRENT 01/06/2010  . LOW BACK PAIN 01/06/2010  . SCIATICA 08/30/2009  . TROCHANTERIC BURSITIS 08/09/2009  . ABDOMINAL PAIN RIGHT LOWER QUADRANT 01/12/2008  . MORTON'S NEUROMA 09/30/2007  . ETHMOID SINUSITIS 09/30/2007  . LEG PAIN, BILATERAL 09/30/2007  . Abdominal pain, other specified site 09/30/2007  . DEPRESSION 09/22/2007  . Essential hypertension 09/22/2007    PHYSICAL THERAPY DISCHARGE SUMMARY  Visits from Start of Care: 8  Current functional level related to goals / functional outcomes: See above   Remaining deficits: See above   Education / Equipment: HEP Plan: Patient agrees to discharge.  Patient goals were met. Patient is being discharged due to being pleased with the current functional level.  ?????         [facility] [address], [address] [place], [place], [address] Phone: [phone]   Fax:  [phone]  Name: [name] MRN: [id number] Date of Birth: 1961/06/29

## 2019-01-19 ENCOUNTER — Ambulatory Visit: Payer: Self-pay | Admitting: Physical Therapy

## 2019-01-19 ENCOUNTER — Encounter: Payer: Self-pay | Admitting: Nurse Practitioner

## 2019-01-19 DIAGNOSIS — E7800 Pure hypercholesterolemia, unspecified: Secondary | ICD-10-CM | POA: Insufficient documentation

## 2019-01-21 ENCOUNTER — Encounter: Payer: BC Managed Care – PPO | Admitting: Physical Therapy

## 2019-01-28 ENCOUNTER — Ambulatory Visit (INDEPENDENT_AMBULATORY_CARE_PROVIDER_SITE_OTHER): Payer: BC Managed Care – PPO | Admitting: Nurse Practitioner

## 2019-01-28 ENCOUNTER — Other Ambulatory Visit (INDEPENDENT_AMBULATORY_CARE_PROVIDER_SITE_OTHER): Payer: BC Managed Care – PPO

## 2019-01-28 ENCOUNTER — Telehealth: Payer: Self-pay | Admitting: Family Medicine

## 2019-01-28 ENCOUNTER — Encounter

## 2019-01-28 ENCOUNTER — Encounter: Payer: Self-pay | Admitting: Nurse Practitioner

## 2019-01-28 ENCOUNTER — Encounter: Payer: Self-pay | Admitting: Family Medicine

## 2019-01-28 ENCOUNTER — Ambulatory Visit (INDEPENDENT_AMBULATORY_CARE_PROVIDER_SITE_OTHER): Payer: BC Managed Care – PPO | Admitting: Neurology

## 2019-01-28 VITALS — BP 110/84 | HR 72 | Ht 64.0 in | Wt 185.4 lb

## 2019-01-28 DIAGNOSIS — R1011 Right upper quadrant pain: Secondary | ICD-10-CM

## 2019-01-28 DIAGNOSIS — R748 Abnormal levels of other serum enzymes: Secondary | ICD-10-CM | POA: Diagnosis not present

## 2019-01-28 DIAGNOSIS — K5909 Other constipation: Secondary | ICD-10-CM

## 2019-01-28 DIAGNOSIS — M79601 Pain in right arm: Secondary | ICD-10-CM

## 2019-01-28 DIAGNOSIS — R110 Nausea: Secondary | ICD-10-CM

## 2019-01-28 LAB — HEPATIC FUNCTION PANEL
ALT: 22 U/L (ref 0–35)
AST: 15 U/L (ref 0–37)
Albumin: 4.7 g/dL (ref 3.5–5.2)
Alkaline Phosphatase: 163 U/L — ABNORMAL HIGH (ref 39–117)
BILIRUBIN TOTAL: 0.8 mg/dL (ref 0.2–1.2)
Bilirubin, Direct: 0.2 mg/dL (ref 0.0–0.3)
Total Protein: 7.4 g/dL (ref 6.0–8.3)

## 2019-01-28 LAB — GAMMA GT: GGT: 31 U/L (ref 7–51)

## 2019-01-28 NOTE — Progress Notes (Addendum)
Chief Complaint:   Chronic nausea, abdominal pain, bloating, excessive gas  IMPRESSION and PLAN:    1. 58 yo female with with right mid abdominal pain,  progressive over the last year. Patient has untreated chronic constipation.   -The first thing we need to do is treat her constipation and see if pain resolves. See #2.  - If pain does not resolve after resolution of constipation then I would like to treat her for a few days for musculoskeletal pain since the pain is exacerbated by exercise.  Plan would be ibuprofen 600 mg 3 times daily for 3 to 4 days.  -If pain persists beyond this then consider abdominal imaging. -I did ask her to refrain from exercising until her follow-up appointment. Follow up with me in ~ 4 weeks.   2. Chronic constipation characterized by infrequent bowel movements with hard pebble-like stool.  -Begin MiraLAX, 1 capful in 8 ounces of water daily.  Goal is for 1 bowel movement daily, she will titrate MiraLAX between 1-2 times a day to achieve goal  3.  Chronic nausea, mainly in the a.m. She also complains of bloating / excessive gas.  No weight loss, she has gained.   -Multiple possibilities for her symptoms, will take a little time to sort out but she may come to upper endoscopy soon .   4. Elevated alk phos, isolated.  Alk phos elevated in over the last two years and over the last year has stayed around 150 to 175. -Unsure if this is of liver origin.  We will start with a GGT. Also obtain AMA -No bile duct dilation on ultrasound December 2019 done for evaluation of elevated alkaline phosphatase.  -Recheck liver tests today to make sure not progressing.   5. Fatty liver by [place]. Normal liver tests with except of Alk phos which may not even been of liver origin  Addendum: Reviewed and agree with assessment and management plan. Jerene Bears, MD     HPI:     Patient is a 58 year old female who had a screening colonoscopy by Dr. Hilarie Fredrickson February 2016.   Exam was normal  Patient is here for evaluation of abdominal pain.  She believes pain has been present for 1.5 years but has gotten progressively worse over the last several months.  The pain is in the right mid abdomen, occasionally radiates down into the RLQ.  She experiences the pain at least once a day,  it can be stabbing.  Pain is unrelated to eating. It is exacerbated by bending and gets worse with certain exercises.  Sometimes feels bloated like a balloon is blowing up inside of her. Also has a lot belching and flatus. She has chronic nausea, mainly in the am before breakfast. No weight loss, she has gained. No NSAID use  [name] has chronic constipation.  Hot beverages sometimes help.  She defines constipation as infrequent bowel movements and pebble-like stools.  She drinks a lot of water throughout the day.    Data reviewed:   11/20/18- [place] for evaluation of elevated alkaline phosphatase. -- fatty liver.  No cholelithiasis, no bile duct dilation.  Normal blood flow towards liver   Review of systems:     No chest pain, no SOB, no fevers, no urinary sx   Past Medical History:  Diagnosis Date  . Allergy    Dr Caprice Red  . Cardiac arrhythmia   . Depression   . Diabetes mellitus without complication ([place])   .  Fibroids    uterine sees Dr. Carlota Raspberry in [place]   . Genital warts   . Heart murmur   . Hypertension   . Irritable bowel syndrome (IBS)   . Low back pain    has a herniated lumbar disc sees Dr Marlou Sa    Patient's surgical history, family medical history, social history, medications and allergies were all reviewed in Epic   Creatinine clearance cannot be calculated (Patient's most recent lab result is older than the maximum 21 days allowed.)  Current Outpatient Medications  Medication Sig Dispense Refill  . albuterol (PROVENTIL HFA;VENTOLIN HFA) 108 (90 Base) MCG/ACT inhaler Inhale 2 puffs into the lungs every 4 (four) hours as needed for wheezing or shortness of breath.  1 Inhaler 11  . amLODipine (NORVASC) 10 MG tablet Take 1 tablet (10 mg total) by mouth daily. 90 tablet 3  . atorvastatin (LIPITOR) 20 MG tablet TAKE ONE TABLET BY MOUTH DAILY 90 tablet 10  . cholecalciferol (VITAMIN D) 1000 UNITS tablet Take 1,000 Units by mouth daily.     Marland Kitchen escitalopram (LEXAPRO) 10 MG tablet Take 10 mg by mouth daily.    . Ibuprofen-Famotidine 800-26.6 MG TABS Take 1 tablet by mouth 3 (three) times daily. 90 tablet 3  . lisinopril (PRINIVIL,ZESTRIL) 20 MG tablet Take 1 tablet (20 mg total) by mouth 2 (two) times daily. 180 tablet 3  . Multiple Vitamins-Minerals (MULTIVITAMIN WITH MINERALS) tablet Take 1 tablet by mouth daily.    . NON FORMULARY     . potassium chloride SA (K-DUR,KLOR-CON) 20 MEQ tablet Take 1 tablet (20 mEq total) by mouth daily. 90 tablet 3  . valACYclovir (VALTREX) 500 MG tablet Take twice daily as needed for herpes virus outbreaks 30 tablet 5   No current facility-administered medications for this visit.     Physical Exam:     BP 110/84   Pulse 72   Ht 5' 4"  (1.626 m)   Wt 185 lb 6.4 oz (84.1 kg)   BMI 31.82 kg/m   GENERAL:  Pleasant female in NAD PSYCH: : Cooperative, normal affect EENT:  conjunctiva pink, mucous membranes moist, neck supple without masses CARDIAC:  RRR, no murmur heard, no peripheral edema PULM: Normal respiratory effort, lungs CTA bilaterally, no wheezing ABDOMEN:  Nondistended, soft, nontender. No obvious masses, no hepatomegaly,  normal bowel sounds. I could not reproduce pain on exam  SKIN:  turgor, no lesions seen Musculoskeletal:  Normal muscle tone, normal strength NEURO: Alert and oriented x 3, no focal neurologic deficits   [name] , NP 01/28/2019, 3:15 PM

## 2019-01-28 NOTE — Procedures (Signed)
[facility]  [address]  [place], [place] [address] Tel: [phone] Fax:  [phone] Test Date:  01/28/2019  Patient: [name] DOB: 03-18-1961 Physician: Narda Amber, DO  Sex: Female Height: 5\' 4"  Ref Phys: Darra Lis, MD  ID#: [id number] Temp: 35.9C Technician:    Patient Complaints: This is a 58 year old female referred for evaluation of right upper arm pain and numbness.  NCV & EMG Findings: Extensive electrodiagnostic testing of the right upper extremity shows:  1. Right median, ulnar, and mixed palmar sensory responses are within normal limits. 2. Right median and ulnar motor responses are within normal limits. 3. There is no evidence of active or chronic motor axonal loss changes affecting any of the tested muscles.  Motor unit configuration and recruitment pattern is within normal limits.  Impression: This is a normal study of the right upper extremity.  In particular, there is no evidence of a cervical radiculopathy or carpal tunnel syndrome.   ___________________________ Narda Amber, DO    Nerve Conduction Studies Anti Sensory Summary Table   Site NR Peak (ms) Norm Peak (ms) P-T Amp (V) Norm P-T Amp  Right Median Anti Sensory (2nd Digit)  35.9C  Wrist    3.0 <3.6 22.7 >15  Right Ulnar Anti Sensory (5th Digit)  35.9C  Wrist    2.6 <3.1 20.2 >10   Motor Summary Table   Site NR Onset (ms) Norm Onset (ms) O-P Amp (mV) Norm O-P Amp Site1 Site2 Delta-0 (ms) Dist (cm) Vel (m/s) Norm Vel (m/s)  Right Median Motor (Abd Poll Brev)  35.9C  Wrist    3.0 <4.0 7.8 >6 Elbow Wrist 4.7 28.0 60 >50  Elbow    7.7  7.3         Right Ulnar Motor (Abd Dig Minimi)  35.9C  Wrist    2.0 <3.1 7.2 >7 B Elbow Wrist 3.9 23.0 59 >50  B Elbow    5.9  6.2  A Elbow B Elbow 1.9 10.0 53 >50  A Elbow    7.8  5.7          Comparison Summary Table   Site NR Peak (ms) Norm Peak (ms) P-T Amp (V) Site1 Site2 Delta-P (ms) Norm Delta (ms)  Right  Median/Ulnar Palm Comparison (Wrist - 8cm)  35.9C  Median Palm    1.9 <2.2 22.7 Median Palm Ulnar Palm 0.2   Ulnar Palm    1.7 <2.2 11.7       EMG   Side Muscle Ins Act Fibs Psw Fasc Number Recrt Dur Dur. Amp Amp. Poly Poly. Comment  Right 1stDorInt Nml Nml Nml Nml Nml Nml Nml Nml Nml Nml Nml Nml N/A  Right PronatorTeres Nml Nml Nml Nml Nml Nml Nml Nml Nml Nml Nml Nml N/A  Right Biceps Nml Nml Nml Nml Nml Nml Nml Nml Nml Nml Nml Nml N/A  Right Triceps Nml Nml Nml Nml Nml Nml Nml Nml Nml Nml Nml Nml N/A  Right Deltoid Nml Nml Nml Nml Nml Nml Nml Nml Nml Nml Nml Nml N/A      Waveforms:

## 2019-01-28 NOTE — Telephone Encounter (Signed)
Spoke with patient about nerve study results. She is still having symptoms. May need further work up.   Rosemarie Ax, MD [facility] 01/28/2019, 11:05 AM

## 2019-01-28 NOTE — Patient Instructions (Addendum)
Please refrain from exercising until next follow-up visit with me  Start MiraLAX 1 capful in 8 ounces of water daily.  You can increase this to twice daily if needed.  Goal is for a daily bowel movement daily  Once bowels are moving well please call us with an update on your abdominal pain.  Further recommendations will follow based on how things go  Your provider has requested that you go to the basement level for lab work before leaving today. Press "B" on the elevator. The lab is located at the first door on the left as you exit the elevator.    Follow-up with me on February 25, 2019 at 3 pm  Thank you for choosing me and [facility].   [name], NP

## 2019-01-29 NOTE — Addendum Note (Signed)
Addended by: Jerene Bears on: 01/29/2019 12:21 PM   Modules accepted: Level of Service

## 2019-01-30 LAB — MITOCHONDRIAL ANTIBODIES: Mitochondrial M2 Ab, IgG: <=20 U

## 2019-02-03 ENCOUNTER — Encounter: Payer: Self-pay | Admitting: Family Medicine

## 2019-02-04 ENCOUNTER — Telehealth: Payer: Self-pay | Admitting: Nurse Practitioner

## 2019-02-04 MED ORDER — MAGNESIUM 30 MG PO TABS: 30.0000 mg | ORAL_TABLET | Freq: Every day | ORAL | Status: DC

## 2019-02-04 NOTE — Telephone Encounter (Signed)
Pt had OV with Nevin Bloodgood 2.12.20.  Pt called to inform that she is taking magnesium 500 mg.  She would like a CB to discuss this.

## 2019-02-04 NOTE — Telephone Encounter (Signed)
Pt called to let you know that Magnesium 500 mg once daily is not on her med list.  I have added that. Also, she states she has looked at her lab results in [place] and is concerned about her alkaline phos level.  She wants to let you know as well that she is taking two doses of Miralax daily instead of one.  One dose did not work for her.

## 2019-02-04 NOTE — Telephone Encounter (Signed)
[name], I have already addressed her elevated alkaline phosphatase.  It does not seem to be a liver issue.  It could be bone.  However, Rosanne Sack will be calling her to repeat liver test in 1 month at which time I will add an additional test to try and decipher the origin of elevated alkaline phosphatase.  Again, give the test that I did implies that this is NOT liver related.  Also, the point of her being on MiraLAX was to see if we could resolve her problems with constipation and in turn if that helped her abdominal pain.  So I need to know where we are as far as her bowel movements and abdominal pain.

## 2019-02-05 ENCOUNTER — Other Ambulatory Visit: Payer: Self-pay

## 2019-02-05 DIAGNOSIS — R7989 Other specified abnormal findings of blood chemistry: Secondary | ICD-10-CM

## 2019-02-05 DIAGNOSIS — R945 Abnormal results of liver function studies: Principal | ICD-10-CM

## 2019-02-05 NOTE — Telephone Encounter (Signed)
Spoke with patient this morning.  She states that overall her abdominal pain and constipation are better on Miralax.  I told her that labs will be repeated in one month regarding elevated alkaline phos.

## 2019-02-11 ENCOUNTER — Ambulatory Visit: Payer: BC Managed Care – PPO | Admitting: Neurology

## 2019-02-11 ENCOUNTER — Encounter

## 2019-02-17 ENCOUNTER — Telehealth: Payer: Self-pay

## 2019-02-17 NOTE — Telephone Encounter (Signed)
Yes, that is the plan. So pain persists in the setting of liquid bowel movements so proceed with ibuprofen.  If helps then this may be musculoskeletal pain.  If no relief then will probably proceed with abdominal imaging.  Beth,  please make sure she reduces the MiraLAX, I do not want her having several bowel movements a day. Like you mentioned, she should titrate dose to one soft BM a day. Thanks

## 2019-02-17 NOTE — Telephone Encounter (Signed)
Received the following message from the patient. I have asked her to keep [place] updated on her progress and to titrate the Miralax to have daily soft formed bowel movements, not diarrhea.   From: [name]    Sent: 02/17/2019  7:55 AM EST      To: [name], NP Subject: RE: Non-Urgent Medical Question  The pain is back. It had been over a week. I am using the bathroom a lot.several times a day. It is to the point when I pass gas I use the bathroom. I also still have gas from both ends.I will  start the Ibuprofen medicine now. Starting on February 17 2019

## 2019-02-23 ENCOUNTER — Other Ambulatory Visit (INDEPENDENT_AMBULATORY_CARE_PROVIDER_SITE_OTHER): Payer: BC Managed Care – PPO

## 2019-02-23 DIAGNOSIS — R945 Abnormal results of liver function studies: Secondary | ICD-10-CM | POA: Diagnosis not present

## 2019-02-23 DIAGNOSIS — R7989 Other specified abnormal findings of blood chemistry: Secondary | ICD-10-CM

## 2019-02-23 LAB — HEPATIC FUNCTION PANEL
ALT: 21 U/L (ref 0–35)
AST: 18 U/L (ref 0–37)
Albumin: 4.5 g/dL (ref 3.5–5.2)
Alkaline Phosphatase: 140 U/L — ABNORMAL HIGH (ref 39–117)
Bilirubin, Direct: 0.1 mg/dL (ref 0.0–0.3)
Total Bilirubin: 0.8 mg/dL (ref 0.2–1.2)
Total Protein: 7 g/dL (ref 6.0–8.3)

## 2019-02-23 NOTE — Addendum Note (Signed)
Addended by: Isaiah Serge D on: 02/23/2019 04:52 PM   Modules accepted: Orders

## 2019-02-25 ENCOUNTER — Encounter: Payer: Self-pay | Admitting: Family Medicine

## 2019-02-25 ENCOUNTER — Other Ambulatory Visit (INDEPENDENT_AMBULATORY_CARE_PROVIDER_SITE_OTHER): Payer: BC Managed Care – PPO

## 2019-02-25 ENCOUNTER — Encounter: Payer: Self-pay | Admitting: Nurse Practitioner

## 2019-02-25 ENCOUNTER — Ambulatory Visit: Payer: BC Managed Care – PPO | Admitting: Nurse Practitioner

## 2019-02-25 ENCOUNTER — Other Ambulatory Visit: Payer: Self-pay

## 2019-02-25 VITALS — BP 112/70 | HR 80 | Temp 97.7°F | Ht 63.75 in | Wt 188.1 lb

## 2019-02-25 DIAGNOSIS — R109 Unspecified abdominal pain: Secondary | ICD-10-CM

## 2019-02-25 DIAGNOSIS — R1011 Right upper quadrant pain: Secondary | ICD-10-CM

## 2019-02-25 DIAGNOSIS — R748 Abnormal levels of other serum enzymes: Secondary | ICD-10-CM | POA: Diagnosis not present

## 2019-02-25 LAB — BASIC METABOLIC PANEL
BUN: 14 mg/dL (ref 6–23)
CO2: 29 mEq/L (ref 19–32)
Calcium: 9.7 mg/dL (ref 8.4–10.5)
Chloride: 106 mEq/L (ref 96–112)
Creatinine, Ser: 0.73 mg/dL (ref 0.40–1.20)
GFR: 99.76 mL/min (ref >60.00)
GLUCOSE: 127 mg/dL — AB (ref 70–99)
Potassium: 4.3 mEq/L (ref 3.5–5.1)
Sodium: 141 mEq/L (ref 135–145)

## 2019-02-25 LAB — ALKALINE PHOSPHATASE, ISOENZYMES
Alkaline Phosphatase: 152 IU/L — ABNORMAL HIGH (ref 39–117)
BONE FRACTION: 59 % (ref 14–68)
INTESTINAL FRAC.: 0 % (ref 0–18)
LIVER FRACTION: 41 % (ref 18–85)

## 2019-02-25 MED ORDER — PREDNISONE 10 MG PO TABS: ORAL_TABLET | ORAL | 0 refills | Status: AC

## 2019-02-25 NOTE — Progress Notes (Addendum)
° ° ° ° ° °Chief Complaint:    Persistent right sided abdominal pain ° °IMPRESSION and PLAN:   ° °1. 58 yo female with persistent right mid abdominal pain. Only transient relief after resolution of constipation followed by course of NSAIDS. Her pain is mainly related to activity (especially walking) and bending. Pain doesn't really seem GI in origin but it is persisting.  °-Will schedule her to CT abd/ pelvis for further evaluation. I did ask her in the interim to please contact her back surgeon and ask if he thinks this pain could be originating from her back. If Surgeon thinks this is plausible then we will hold off on CT scan until she can be further evaluated by back Surgeon.   ° °2. Chronically elevated Alkaline phos. GGT, AMA negative. Normal liver phos on fractionation study. No dilated bile ducts on imaging. Explained that this does not seem to be originating from her liver.  °-Will repeat liver tests again in 2-3  months to make sure stable.  ° °3. Bloating. Tried probiotics but not really helpful. If CT scan negative, consider course of xifaxan or Flagyl.  ° °Addendum: °Reviewed and agree with assessment and management plan. °Pyrtle, Jay M, MD ° ° ° °HPI:   ° ° °Patient is a 58-year-old female known to Dr. Pyrtle.  She had ascreening colonoscopy by him in 2016.  I saw patient 01/28/2019 for evaluation of right mid abdominal pain which was exacerbated by exercise.  She had concurrent constipation.  Alkaline phosphatase was noted to be elevated over the last 2 years.  No bile duct dilation on ultrasound December 2019 (done to evaluate the elevated alk phos).  Checked a GGT, it was normal.  Antimitochondrial antibody also normal.  Evaluation of her pain the first thing we did was work on her constipation.  If pain did not improve we are going to try course of NSAIDs and abdominal imaging if that did not help.  We spoke over the phone to patient on 02/05/2019, her abdominal pain and constipation were better on  MiraLAX.  Regarding the elevated alkaline phosphatase, we plan for repeat labs in a month.  ° °Patient called the office again 02/17/2019 with complaints of recurrent abdominal pain.  She was having several bowel movements a day on MiraLAX.  She was started on course of ibuprofen as we had discussed in our previous visit. Pain improved for a couple of days then recurred over the weekend. The recurrent right mid abdominal pain was quite severe but again it was associated with walking. She even notices the pain when walking casually. She has had some dysuria but reported to GYN and says u/a was negative.  ° °In addition to above [name] complains of burning in her chest sometimes, especially associated with belching. She feels bloated a lot. Probiotics didn't help. She has chronic am nausea, unchanged ° ° ° °Review of systems:     No chest pain, no SOB, no fevers, no urinary sx  ° °Past Medical History:  °Diagnosis Date  °• Allergy   ° Dr Eugene Tolstoy  °• Cardiac arrhythmia   °• Depression   °• Diabetes mellitus without complication ([facility])   °• Fibroids   ° uterine sees Dr. Greene in [place]   °• Genital warts   °• Heart murmur   °• Hypertension   °• Irritable bowel syndrome (IBS)   °• Low back pain   ° has a herniated lumbar disc sees Dr Dean  ° ° °  Patient's surgical history, family medical history, social history, medications and allergies were all reviewed in Epic  ° °Creatinine clearance cannot be calculated (Patient's most recent lab result is older than the maximum 21 days allowed.) ° °Current Outpatient Medications  °Medication Sig Dispense Refill  °• albuterol (PROVENTIL HFA;VENTOLIN HFA) 108 (90 Base) MCG/ACT inhaler Inhale 2 puffs into the lungs every 4 (four) hours as needed for wheezing or shortness of breath. 1 Inhaler 11  °• amLODipine (NORVASC) 10 MG tablet Take 1 tablet (10 mg total) by mouth daily. 90 tablet 3  °• atorvastatin (LIPITOR) 20 MG tablet TAKE ONE TABLET BY MOUTH DAILY 90 tablet 10  °•  cholecalciferol (VITAMIN D) 1000 UNITS tablet Take 1,000 Units by mouth daily.     °• escitalopram (LEXAPRO) 10 MG tablet Take 10 mg by mouth daily.    °• Ibuprofen-Famotidine 800-26.6 MG TABS Take 1 tablet by mouth 3 (three) times daily. 90 tablet 3  °• lisinopril (PRINIVIL,ZESTRIL) 20 MG tablet Take 1 tablet (20 mg total) by mouth 2 (two) times daily. 180 tablet 3  °• Magnesium 500 MG TABS Take by mouth daily.    °• Multiple Vitamins-Minerals (MULTIVITAMIN WITH MINERALS) tablet Take 1 tablet by mouth daily.    °• NON FORMULARY     °• potassium chloride SA (K-DUR,KLOR-CON) 20 MEQ tablet Take 1 tablet (20 mEq total) by mouth daily. 90 tablet 3  °• valACYclovir (VALTREX) 500 MG tablet Take twice daily as needed for herpes virus outbreaks 30 tablet 5  ° °No current facility-administered medications for this visit.   ° ° °Physical Exam:   ° ° °Ht 5' 3.75" (1.619 m) Comment: height measured without shoes   Wt 188 lb 2 oz (85.3 kg)    BMI 32.55 kg/m²  ° °GENERAL:  Pleasant female in NAD °PSYCH: : Cooperative, normal affect °EENT:  conjunctiva pink, mucous membranes moist, neck supple without masses °CARDIAC:  RRR,  no peripheral edema °PULM: Normal respiratory effort, lungs CTA bilaterally, no wheezing °ABDOMEN:  Nondistended, soft, nontender. No obvious masses, no hepatomegaly,  normal bowel sounds °SKIN:  turgor, no lesions seen °Musculoskeletal:  Normal muscle tone, normal strength °NEURO: Alert and oriented x 3, no focal neurologic deficits ° ° °[name] , NP 02/25/2019, 3:02 PM ° °

## 2019-02-25 NOTE — Patient Instructions (Addendum)
If you are age 56 or older, your body mass index should be between 23-30. Your Body mass index is 32.55 kg/m. If this is out of the aforementioned range listed, please consider follow up with your Primary Care Provider.  If you are age 56 or younger, your body mass index should be between 19-25. Your Body mass index is 32.55 kg/m. If this is out of the aformentioned range listed, please consider follow up with your Primary Care Provider.   You have been scheduled for a CT scan of the abdomen and pelvis at  [facility].  You are scheduled on 03/04/19 at 12:30 pm . You should arrive 15 minutes prior to your appointment time for registration. Please follow the written instructions below on the day of your exam:  WARNING: IF YOU ARE ALLERGIC TO IODINE/X-RAY DYE, PLEASE NOTIFY RADIOLOGY IMMEDIATELY AT [phone]! YOU WILL BE GIVEN A 13 HOUR PREMEDICATION PREP.  TAKE 28m of Prednisone 13 hours prior to scan. Then take 50 mg of Prednisone 7 hours prior to scan. Then take 50 mg 1 hour prior to scan. This has been sent to your pharmacy to pick up.  Take Benadryl 50 mg at 1 hour prior to scan. (You can get this over-the-counter)  1) Do not eat or drink anything after 8:30 am (4 hours prior to your test) 2) You have been given 2 bottles of oral contrast to drink. The solution may taste better if refrigerated, but do NOT add ice or any other liquid to this solution. Shake well before drinking.    Drink 1 bottle of contrast @ 10:30 am (2 hours prior to your exam)  Drink 1 bottle of contrast @ 11:30 am (1 hour prior to your exam)  You may take any medications as prescribed with a small amount of water, if necessary. If you take any of the following medications: METFORMIN, GLUCOPHAGE, GLUCOVANCE, AVANDAMET, RIOMET, FORTAMET, [place] and [place], JANUMET, GLUMETZA or METAGLIP, you MAY be asked to HOLD this medication 48 hours AFTER the exam.  The purpose of you drinking the oral contrast is to aid in  the visualization of your intestinal tract. The contrast solution may cause some diarrhea. Depending on your individual set of symptoms, you may also receive an intravenous injection of x-ray contrast/dye. [facility], depending on the type of exam you are having performed.  This test typically takes 30-45 minutes to complete.  If you have any questions regarding your exam or if you need to reschedule, you may call the Radiology Dept [phone] ________________________________________________________________Your provider has requested that you go to the basement level for labs before leaving today. Press "B" on the elevator. The lab is located at the first door on the left as you exit the elevator.  You will return for Liver Function Test on 04/27/19.  We will call you with results.  [facility] you for [facility].   PTye Savoy NP

## 2019-02-26 ENCOUNTER — Telehealth: Payer: Self-pay | Admitting: Nurse Practitioner

## 2019-02-26 NOTE — Telephone Encounter (Signed)
Confirmed the IV contrast pre-medication with prednisone and Benadryl.

## 2019-02-26 NOTE — Telephone Encounter (Signed)
Pt has some questions regarding CT scan.

## 2019-02-27 ENCOUNTER — Encounter: Payer: Self-pay | Admitting: Nurse Practitioner

## 2019-03-01 ENCOUNTER — Other Ambulatory Visit: Payer: Self-pay

## 2019-03-01 ENCOUNTER — Encounter (HOSPITAL_COMMUNITY): Payer: Self-pay | Admitting: Emergency Medicine

## 2019-03-01 ENCOUNTER — Ambulatory Visit (HOSPITAL_COMMUNITY)
Admission: EM | Admit: 2019-03-01 | Discharge: 2019-03-01 | Disposition: A | Discharge status: Home / Self Care | Payer: BC Managed Care – PPO | Attending: Family Medicine | Admitting: Family Medicine

## 2019-03-01 DIAGNOSIS — J069 Acute upper respiratory infection, unspecified: Secondary | ICD-10-CM

## 2019-03-01 DIAGNOSIS — B9789 Other viral agents as the cause of diseases classified elsewhere: Secondary | ICD-10-CM

## 2019-03-01 NOTE — ED Triage Notes (Signed)
Pt presents to [facility] for assessment of cough, congestion, sore throat, fatigue, and chills since Friday

## 2019-03-01 NOTE — Discharge Instructions (Addendum)
I believe this is a viral upper respiratory infection Over-the-counter medication as needed for symptoms Mucinex is good for cough, congestion into the mucus. You could do daily Zyrtec to help with runny nose, congestion and fluid in the ear. Flonase nasal spray may also help with your symptoms. All of this can be obtained over-the-counter Follow up as needed for continued or worsening symptoms

## 2019-03-01 NOTE — ED Provider Notes (Signed)
[name]    CSN: [id number] Arrival date & time: 03/01/19  1325     History   Chief Complaint Chief Complaint  Patient presents with  . Cough    HPI [name] is a 58 y.o. female.    URI  Presenting symptoms: congestion, cough, fever, rhinorrhea and sore throat   Severity:  Mild Duration:  3 days Timing:  Constant Progression:  Improving Chronicity:  New Relieved by:  Nothing Worsened by:  Nothing Associated symptoms: no arthralgias, no headaches, no myalgias, no neck pain, no sinus pain, no sneezing, no swollen glands and no wheezing   Risk factors: sick contacts   Risk factors: no immunosuppression, no recent illness and no recent travel     Past Medical History:  Diagnosis Date  . Allergy    Dr Caprice Red  . Cardiac arrhythmia   . Depression   . Diabetes mellitus without complication ([place])   . Fibroids    uterine sees Dr. Carlota Allen in [place]   . Genital warts   . Heart murmur   . Hypertension   . Irritable bowel syndrome (IBS)   . Low back pain    has a herniated lumbar disc sees Dr [name]    Patient Active Problem List   Diagnosis Date Noted  . Left lumbar radiculopathy 12/25/2018  . Right arm pain 11/26/2018  . Low back pain 11/26/2018  . Left knee pain 10/01/2018  . 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 Type 2 diabetes mellitus without complication, without long-term current use of insulin ([place]) 02/22/2017  . Loss of transverse plantar arch 10/31/2016  . Pronation deformity of ankle, acquired 10/31/2016  . Patellofemoral pain syndrome 09/23/2015  . Diabetes mellitus, type II ([place]) 04/27/2015  . OSA (obstructive sleep apnea) 03/31/2014  . Asthma, chronic 08/14/2013  . Leg edema 08/14/2013  . Costochondritis 04/10/2013  . Osteoarthritis 09/11/2010  . SYNCOPE 07/12/2010  . VAGINITIS, BACTERIAL 06/28/2010  . GERD 03/17/2010  . PALPITATIONS 03/17/2010  . CHEST PAIN 03/17/2010  . ACUTE BRONCHITIS 01/23/2010  .  CANDIDIASIS OF VULVA AND VAGINA 01/06/2010  . DEPRESSION, MODERATE, RECURRENT 01/06/2010  . LOW BACK PAIN 01/06/2010  . SCIATICA 08/30/2009  . TROCHANTERIC BURSITIS 08/09/2009  . ABDOMINAL PAIN RIGHT LOWER QUADRANT 01/12/2008  . MORTON'S NEUROMA 09/30/2007  . ETHMOID SINUSITIS 09/30/2007  . LEG PAIN, BILATERAL 09/30/2007  . Abdominal pain, other specified site 09/30/2007  . DEPRESSION 09/22/2007  . Essential hypertension 09/22/2007    Past Surgical History:  Procedure Laterality Date  . ABDOMINOPLASTY  04/2013  . COLONOSCOPY  02-10-15   per Dr. Hilarie Fredrickson, clear, repeat in 10 yrs   . LUMBAR LAMINECTOMY     L3-4 on 02-24-10 per Dr Louanne Skye   . MYOMECTOMY     laser ablation of uterine fibroids per Dr Ouida Sills 2000  . TUBAL LIGATION      OB History    Gravida  6   Para  4   Term  4   Preterm      AB  2   Living  4     SAB      TAB      Ectopic      Multiple      Live Births               Home Medications    Prior to Admission medications   Medication Sig Start Date End Date Taking? Authorizing Provider  albuterol (PROVENTIL HFA;VENTOLIN HFA) 108 (90 Base)  MCG/ACT inhaler Inhale 2 puffs into the lungs every 4 (four) hours as needed for wheezing or shortness of breath. 09/10/17   Laurey Morale, MD  amLODipine (NORVASC) 10 MG tablet Take 1 tablet (10 mg total) by mouth daily. 11/11/18   Laurey Morale, MD  atorvastatin (LIPITOR) 20 MG tablet TAKE ONE TABLET BY MOUTH DAILY 09/08/18   Laurey Morale, MD  cholecalciferol (VITAMIN D) 1000 UNITS tablet Take 1,000 Units by mouth daily.     [provider]  escitalopram (LEXAPRO) 10 MG tablet Take 10 mg by mouth daily. 03/06/18   [provider]  Ibuprofen-Famotidine 800-26.6 MG TABS Take 1 tablet by mouth 3 (three) times daily. 09/30/18   Rosemarie Ax, MD  lisinopril (PRINIVIL,ZESTRIL) 20 MG tablet Take 1 tablet (20 mg total) by mouth 2 (two) times daily. 11/11/18   Laurey Morale, MD  Magnesium 500  MG TABS Take by mouth daily.    [provider]  Multiple Vitamins-Minerals (MULTIVITAMIN WITH MINERALS) tablet Take 1 tablet by mouth daily.    [provider]  NON FORMULARY     [provider]  potassium chloride Allen (K-DUR,KLOR-CON) 20 MEQ tablet Take 1 tablet (20 mEq total) by mouth daily. 11/11/18   Laurey Morale, MD  valACYclovir (VALTREX) 500 MG tablet Take twice daily as needed for herpes virus outbreaks 06/23/18   Laurey Morale, MD    Family History Family History  Problem Relation Age of Onset  . Arthritis Other        Family Hx  . Diabetes Other        Family Hx 1st degree relative  . Hyperlipidemia Other        Family Hx  . Hypertension Other        Family Hx  . Prostate cancer Other        Family Hx 1st Degree relative <50  . Stroke Other        First Degree Female <50  . Diabetes Mother   . Hypertension Mother   . Diabetes Father   . Hypertension Father   . Diabetes Maternal Grandmother   . Hypertension Maternal Grandmother   . Diabetes Maternal Grandfather   . Hypertension Maternal Grandfather   . Diabetes Paternal Grandmother   . Hypertension Paternal Grandmother   . Diabetes Paternal Grandfather   . Hypertension Paternal Grandfather   . Colon cancer Neg Hx   . Breast cancer Neg Hx     Social History Social History   Tobacco Use  . Smoking status: Former Smoker    Packs/day: 0.50    Years: 1.00    Pack years: 0.50    Types: Cigarettes    Last attempt to quit: 12/17/1996    Years since quitting: 22.2  . Smokeless tobacco: Never Used  Substance Use Topics  . Alcohol use: No    Alcohol/week: 0.0 standard drinks  . Drug use: No     Allergies   Hctz [hydrochlorothiazide]; Iodinated diagnostic agents; Peanut-containing drug products; Ciprofloxacin; Shellfish-derived products; and Cephalexin   Review of Systems Review of Systems  Constitutional: Positive for fever.  HENT: Positive for congestion, rhinorrhea and sore  throat. Negative for sinus pain and sneezing.   Respiratory: Positive for cough. Negative for wheezing.   Musculoskeletal: Negative for arthralgias, myalgias and neck pain.  Neurological: Negative for headaches.     Physical Exam Triage Vital Signs ED Triage Vitals  Enc Vitals Group     BP  03/01/19 1357 (!) 144/90     Pulse Rate 03/01/19 1357 77     Resp 03/01/19 1357 18     Temp 03/01/19 1357 (!) 97.5 F (36.4 C)     Temp Source 03/01/19 1357 Oral     SpO2 03/01/19 1357 100 %     Weight --      Height --      Head Circumference --      Peak Flow --      Pain Score 03/01/19 1358 5     Pain Loc --      Pain Edu? --      Excl. in [place]? --    No data found.  Updated Vital Signs BP (!) 144/90 (BP Location: Right Arm)   Pulse 77   Temp (!) 97.5 F (36.4 C) (Oral) Comment: Nyquil taken this morning @ 43am  Resp 18   SpO2 100%   Visual Acuity Right Eye Distance:   Left Eye Distance:   Bilateral Distance:    Right Eye Near:   Left Eye Near:    Bilateral Near:     Physical Exam Vitals signs and nursing note reviewed.  Constitutional:      General: She is not in acute distress.    Appearance: Normal appearance. She is not ill-appearing, toxic-appearing or diaphoretic.  HENT:     Head: Normocephalic and atraumatic.     Right Ear: Tympanic membrane and ear canal normal.     Left Ear: Tympanic membrane and ear canal normal.     Nose: Congestion and rhinorrhea present.     Mouth/Throat:     Pharynx: No posterior oropharyngeal erythema.  Neck:     Musculoskeletal: Normal range of motion and neck supple.  Cardiovascular:     Rate and Rhythm: Normal rate and regular rhythm.     Pulses: Normal pulses.     Heart sounds: Normal heart sounds.  Pulmonary:     Effort: Pulmonary effort is normal.     Breath sounds: Normal breath sounds.  Musculoskeletal: Normal range of motion.  Lymphadenopathy:     Cervical: No cervical adenopathy.  Skin:    General: Skin is warm and  dry.  Neurological:     Mental Status: She is alert.  Psychiatric:        Mood and Affect: Mood normal.      UC Treatments / Results  Labs (all labs ordered are listed, but only abnormal results are displayed) Labs Reviewed - No data to display  EKG None  Radiology No results found.  Procedures Procedures (including critical care time)  Medications Ordered in UC Medications - No data to display  Initial Impression / Assessment and Plan / UC Course  I have reviewed the triage vital signs and the nursing notes.  Pertinent labs & imaging results that were available during my care of the patient were reviewed by me and considered in my medical decision making (see chart for details).     Symptoms consistent with viral URI Over-the-counter symptomatic treatment as needed Follow up as needed for continued or worsening symptoms  Final Clinical Impressions(s) / UC Diagnoses   Final diagnoses:  Viral URI with cough     Discharge Instructions     I believe this is a viral upper respiratory infection Over-the-counter medication as needed for symptoms Mucinex is good for cough, congestion into the mucus. You could do daily Zyrtec to help with runny nose, congestion and fluid in the ear. Flonase  nasal spray may also help with your symptoms. All of this can be obtained over-the-counter Follow up as needed for continued or worsening symptoms     ED Prescriptions    None     Controlled Substance Prescriptions [place] Controlled Substance Registry consulted? Not Applicable   Orvan July, NP 03/01/19 1451

## 2019-03-05 ENCOUNTER — Other Ambulatory Visit: Payer: Self-pay

## 2019-03-05 ENCOUNTER — Encounter (HOSPITAL_COMMUNITY): Payer: Self-pay

## 2019-03-05 ENCOUNTER — Ambulatory Visit (HOSPITAL_COMMUNITY)
Admission: RE | Admit: 2019-03-05 | Discharge: 2019-03-05 | Disposition: A | Discharge status: Home / Self Care | Payer: BC Managed Care – PPO | Source: Ambulatory Visit | Attending: Nurse Practitioner | Admitting: Nurse Practitioner

## 2019-03-05 DIAGNOSIS — R109 Unspecified abdominal pain: Secondary | ICD-10-CM | POA: Diagnosis present

## 2019-03-05 MED ORDER — IOHEXOL 300 MG/ML  SOLN
100.0000 mL | Freq: Once | INTRAMUSCULAR | Status: AC | PRN
  Administered 2019-03-05: 100 mL via INTRAVENOUS

## 2019-03-05 MED ORDER — SODIUM CHLORIDE (PF) 0.9 % IJ SOLN
INTRAMUSCULAR | Status: AC
  Filled 2019-03-05: qty 50

## 2019-03-09 ENCOUNTER — Telehealth: Payer: Self-pay | Admitting: Nurse Practitioner

## 2019-03-09 NOTE — Telephone Encounter (Signed)
Pt would like to know if her CT scan results that she had on 3-19

## 2019-03-10 ENCOUNTER — Encounter: Payer: Self-pay | Admitting: Family Medicine

## 2019-03-10 NOTE — Telephone Encounter (Signed)
Left message on machine to call back  

## 2019-03-10 NOTE — Telephone Encounter (Signed)
The pt was advised that she needs to follow up with PCP.  She agreed and thanked me for calling

## 2019-03-11 NOTE — Telephone Encounter (Signed)
Dr. Fry please advise. Thanks  

## 2019-03-11 NOTE — Telephone Encounter (Signed)
Mychart message has been sent to the patient.
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
## 2019-03-18 ENCOUNTER — Telehealth: Payer: Self-pay
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
## 2019-03-18 NOTE — Telephone Encounter (Signed)
-----   Message from Willia Craze, NP sent at 03/17/2019  4:08 PM EDT ----- [name], please call patient with CT scan results. There are a couple of findings that I asked PCP to address (forwarded) the scan but haven't heard back. Nothing on scan to suggest GI source of pain. Can you covert this to a phone not so we have Dr. Vena Rua comments ? Thanks ----- Message ----- From: Jerene Bears, MD Sent: 03/16/2019  10:08 AM EDT To: Willia Craze, NP  I reviewed the CT There is a uterine "mass" which is probably a fibroid.  This should be reviewed and discussed by the patient with her GYN MD. There is also a hematoma in the muscle layer which does not involve GI tract/organs.  This can be followed by PCP. Thus, I do not think further GI eval is warranted at present. Thanks JMP ----- Message ----- From: Willia Craze, NP Sent: 03/13/2019  10:05 AM EDT To: Jerene Bears, MD  Good Morning,  Just checking to see if you saw CT scan I forwarded to you?   I was never convinced that pain was GI but I ordered CTscan for persistent pain. No GI findings and I'm not planning on any further workup up. However, I would like to give her results. I forwarded scan to PCP days ago but haven't heard back (I'm sure he too is slammed right now).  Do you have any thoughts on the "mass" described?   Thanks PG

## 2019-03-23 NOTE — Progress Notes (Deleted)
[name] [address], [place] [address] Phone: [phone] Subjective:      CC: Leg pain and hip pain left side  [id number]  [name] is a 58 y.o. female coming in with complaint of  Left leg pain and back pain.  Has been seen previously.  Diagnosed with more of a greater trochanteric bursitis as well as a L1-2 through L4 impinged on the left side.  Patient did have an epidural in January with minimal some improvement.  Sent for a CT scan of the abdomen.  Found to have a hematoma of 8 x 7 x 3 cm.    Past Medical History:  Diagnosis Date   Allergy    Dr Caprice Red   Cardiac arrhythmia    Depression    Diabetes mellitus without complication [facility])    Fibroids    uterine sees Dr. Carlota Raspberry in [place]    Genital warts    Heart murmur    Hypertension    Irritable bowel syndrome (IBS)    Low back pain    has a herniated lumbar disc sees Dr Marlou Sa   Past Surgical History:  Procedure Laterality Date   ABDOMINOPLASTY  04/2013   COLONOSCOPY  02-10-15   per Dr. Hilarie Fredrickson, clear, repeat in 10 yrs    LUMBAR LAMINECTOMY     L3-4 on 02-24-10 per Dr Louanne Skye    MYOMECTOMY     laser ablation of uterine fibroids per Dr Ouida Sills 2000   TUBAL LIGATION     Social History   Socioeconomic History   Marital status: Married    Spouse name: Not on file   Number of children: 4   Years of education: Not on file   Highest education level: Not on file  Occupational History   Occupation: Pharmacist, hospital  Social Needs   Financial resource strain: Not on file   Food insecurity:    Worry: Not on file    Inability: Not on file   Transportation needs:    Medical: Not on file    Non-medical: Not on file  Tobacco Use   Smoking status: Former Smoker    Packs/day: 0.50    Years: 1.00    Pack years: 0.50    Types: Cigarettes    Last attempt to quit: 12/17/1996    Years since quitting: 22.2   Smokeless tobacco: Never Used    Substance and Sexual Activity   Alcohol use: No    Alcohol/week: 0.0 standard drinks   Drug use: No   Sexual activity: Yes    Birth control/protection: None  Lifestyle   Physical activity:    Days per week: Not on file    Minutes per session: Not on file   Stress: Not on file  Relationships   Social connections:    Talks on phone: Not on file    Gets together: Not on file    Attends religious service: Not on file    Active member of club or organization: Not on file    Attends meetings of clubs or organizations: Not on file    Relationship status: Not on file  Other Topics Concern   Not on file  Social History Narrative   Not on file   Allergies  Allergen Reactions   Hctz [Hydrochlorothiazide] Other (See Comments)    Blood pressure goes up   Iodinated Diagnostic Agents Swelling    Tongue swelling   Peanut-Containing Drug Products Anaphylaxis   Ciprofloxacin  Diarrhea   Shellfish-Derived Products Other (See Comments)     Per allergy test   Cephalexin Rash   Family History  Problem Relation Age of Onset   Arthritis Other        Family Hx   Diabetes Other        Family Hx 1st degree relative   Hyperlipidemia Other        Family Hx   Hypertension Other        Family Hx   Prostate cancer Other        Family Hx 1st Degree relative <50   Stroke Other        First Degree Female <50   Diabetes Mother    Hypertension Mother    Diabetes Father    Hypertension Father    Diabetes Maternal Grandmother    Hypertension Maternal Grandmother    Diabetes Maternal Grandfather    Hypertension Maternal Grandfather    Diabetes Paternal Grandmother    Hypertension Paternal Grandmother    Diabetes Paternal Grandfather    Hypertension Paternal Grandfather    Colon cancer Neg Hx    Breast cancer Neg Hx      Current Outpatient Medications (Cardiovascular):    amLODipine (NORVASC) 10 MG tablet, Take 1 tablet (10 mg total) by mouth daily.    atorvastatin (LIPITOR) 20 MG tablet, TAKE ONE TABLET BY MOUTH DAILY   lisinopril (PRINIVIL,ZESTRIL) 20 MG tablet, Take 1 tablet (20 mg total) by mouth 2 (two) times daily.  Current Outpatient Medications (Respiratory):    albuterol (PROVENTIL HFA;VENTOLIN HFA) 108 (90 Base) MCG/ACT inhaler, Inhale 2 puffs into the lungs every 4 (four) hours as needed for wheezing or shortness of breath.  Current Outpatient Medications (Analgesics):    Ibuprofen-Famotidine 800-26.6 MG TABS, Take 1 tablet by mouth 3 (three) times daily.   Current Outpatient Medications (Other):    cholecalciferol (VITAMIN D) 1000 UNITS tablet, Take 1,000 Units by mouth daily.    escitalopram (LEXAPRO) 10 MG tablet, Take 10 mg by mouth daily.   Magnesium 500 MG TABS, Take by mouth daily.   Multiple Vitamins-Minerals (MULTIVITAMIN WITH MINERALS) tablet, Take 1 tablet by mouth daily.   NON FORMULARY,    potassium chloride SA (K-DUR,KLOR-CON) 20 MEQ tablet, Take 1 tablet (20 mEq total) by mouth daily.   valACYclovir (VALTREX) 500 MG tablet, Take twice daily as needed for herpes virus outbreaks    Past medical history, social, surgical and family history all reviewed in electronic medical record.  No pertanent information unless stated regarding to the chief complaint.   Review of Systems:  No headache, visual changes, nausea, vomiting, diarrhea, constipation, dizziness, abdominal pain, skin rash, fevers, chills, night sweats, weight loss, swollen lymph nodes, body aches, joint swelling, muscle aches, chest pain, shortness of breath, mood changes.   Objective  There were no vitals taken for this visit. Systems examined below as of    General: No apparent distress alert and oriented x3 mood and affect normal, dressed appropriately.  HEENT: Pupils equal, extraocular movements intact  Respiratory: Patient's speak in full sentences and does not appear short of breath  Cardiovascular: No lower extremity edema, non  tender, no erythema  Skin: Warm dry intact with no signs of infection or rash on extremities or on axial skeleton.  Abdomen: Soft nontender  Neuro: Cranial nerves II through XII are intact, neurovascularly intact in all extremities with 2+ DTRs and 2+ pulses.  Lymph: No lymphadenopathy of posterior or anterior cervical chain or axillae  bilaterally.  Gait normal with good balance and coordination.  MSK:  Non tender with full range of motion and good stability and symmetric strength and tone of shoulders, elbows, wrist,  knee and ankles bilaterally.  Hip: Left ROM IR: 45 Deg, ER: 45 Deg, Flexion: 120 Deg, Extension: 100 Deg, Abduction: 45 Deg, Adduction: 45 Deg Strength IR: 5/5, ER: 5/5, Flexion: 5/5, Extension: 5/5, Abduction: 5/5, Adduction: 5/5 Pelvic alignment unremarkable to inspection and palpation. Standing hip rotation and gait without trendelenburg sign / unsteadiness. Greater trochanter without tenderness to palpation. No tenderness over piriformis and greater trochanter. No pain with FABER or FADIR. No SI joint tenderness and normal minimal SI movement. Contralateral hip unremarkable  MSK US performed of: *** This study was ordered, performed, and interpreted by Charlann Boxer D.O.  Hip: Trochanteric bursa without swelling or effusion. Acetabular labrum visualized and without tears, displacement, or effusion in joint. Femoral neck appears unremarkable without increased power doppler signal along Cortex.  IMPRESSION:  NORMAL ULTRASONOGRAPHIC EXAMINATION OF THE HIP.   Impression and Recommendations:     This case required medical decision making of moderate complexity. The above documentation has been reviewed and is accurate and complete Lyndal Pulley, DO       Note: This dictation was prepared with Dragon dictation along with smaller phrase technology. Any transcriptional errors that result from this process are unintentional.

## 2019-03-24 ENCOUNTER — Ambulatory Visit: Payer: BC Managed Care – PPO | Admitting: Family Medicine

## 2019-03-27 NOTE — Progress Notes (Deleted)
[name] [place], [place] [address] Phone: [phone] Subjective:      CC: Leg pain and hip pain left side  WIO:[id number]  [name] is a 58 y.o. female coming in with complaint of  Left leg pain and back pain.  Has been seen previously.  Diagnosed with more of a greater trochanteric bursitis as well as a L1-2 through L4 impinged on the left side.  Patient did have an epidural in January with minimal some improvement.  Sent for a CT scan of the abdomen.  Found to have a hematoma of 8 x 7 x 3 cm.    Past Medical History:  Diagnosis Date   Allergy    Dr Caprice Red   Cardiac arrhythmia    Depression    Diabetes mellitus without complication [facility])    Fibroids    uterine sees Dr. Carlota Raspberry in [place]    Genital warts    Heart murmur    Hypertension    Irritable bowel syndrome (IBS)    Low back pain    has a herniated lumbar disc sees Dr Marlou Sa   Past Surgical History:  Procedure Laterality Date   ABDOMINOPLASTY  04/2013   COLONOSCOPY  02-10-15   per Dr. Hilarie Fredrickson, clear, repeat in 10 yrs    LUMBAR LAMINECTOMY     L3-4 on 02-24-10 per Dr Louanne Skye    MYOMECTOMY     laser ablation of uterine fibroids per Dr Ouida Sills 2000   TUBAL LIGATION     Social History   Socioeconomic History   Marital status: Married    Spouse name: Not on file   Number of children: 4   Years of education: Not on file   Highest education level: Not on file  Occupational History   Occupation: Pharmacist, hospital  Social Needs   Financial resource strain: Not on file   Food insecurity:    Worry: Not on file    Inability: Not on file   Transportation needs:    Medical: Not on file    Non-medical: Not on file  Tobacco Use   Smoking status: Former Smoker    Packs/day: 0.50    Years: 1.00    Pack years: 0.50    Types: Cigarettes    Last attempt to quit: 12/17/1996    Years since quitting: 22.2   Smokeless tobacco: Never Used    Substance and Sexual Activity   Alcohol use: No    Alcohol/week: 0.0 standard drinks   Drug use: No   Sexual activity: Yes    Birth control/protection: None  Lifestyle   Physical activity:    Days per week: Not on file    Minutes per session: Not on file   Stress: Not on file  Relationships   Social connections:    Talks on phone: Not on file    Gets together: Not on file    Attends religious service: Not on file    Active member of club or organization: Not on file    Attends meetings of clubs or organizations: Not on file    Relationship status: Not on file  Other Topics Concern   Not on file  Social History Narrative   Not on file   Allergies  Allergen Reactions   Hctz [Hydrochlorothiazide] Other (See Comments)    Blood pressure goes up   Iodinated Diagnostic Agents Swelling    Tongue swelling   Peanut-Containing Drug Products Anaphylaxis   Ciprofloxacin  Diarrhea   Shellfish-Derived Products Other (See Comments)     Per allergy test   Cephalexin Rash   Family History  Problem Relation Age of Onset   Arthritis Other        Family Hx   Diabetes Other        Family Hx 1st degree relative   Hyperlipidemia Other        Family Hx   Hypertension Other        Family Hx   Prostate cancer Other        Family Hx 1st Degree relative <50   Stroke Other        First Degree Female <50   Diabetes Mother    Hypertension Mother    Diabetes Father    Hypertension Father    Diabetes Maternal Grandmother    Hypertension Maternal Grandmother    Diabetes Maternal Grandfather    Hypertension Maternal Grandfather    Diabetes Paternal Grandmother    Hypertension Paternal Grandmother    Diabetes Paternal Grandfather    Hypertension Paternal Grandfather    Colon cancer Neg Hx    Breast cancer Neg Hx      Current Outpatient Medications (Cardiovascular):    amLODipine (NORVASC) 10 MG tablet, Take 1 tablet (10 mg total) by mouth daily.    atorvastatin (LIPITOR) 20 MG tablet, TAKE ONE TABLET BY MOUTH DAILY   lisinopril (PRINIVIL,ZESTRIL) 20 MG tablet, Take 1 tablet (20 mg total) by mouth 2 (two) times daily.  Current Outpatient Medications (Respiratory):    albuterol (PROVENTIL HFA;VENTOLIN HFA) 108 (90 Base) MCG/ACT inhaler, Inhale 2 puffs into the lungs every 4 (four) hours as needed for wheezing or shortness of breath.  Current Outpatient Medications (Analgesics):    Ibuprofen-Famotidine 800-26.6 MG TABS, Take 1 tablet by mouth 3 (three) times daily.   Current Outpatient Medications (Other):    cholecalciferol (VITAMIN D) 1000 UNITS tablet, Take 1,000 Units by mouth daily.    escitalopram (LEXAPRO) 10 MG tablet, Take 10 mg by mouth daily.   Magnesium 500 MG TABS, Take by mouth daily.   Multiple Vitamins-Minerals (MULTIVITAMIN WITH MINERALS) tablet, Take 1 tablet by mouth daily.   NON FORMULARY,    potassium chloride SA (K-DUR,KLOR-CON) 20 MEQ tablet, Take 1 tablet (20 mEq total) by mouth daily.   valACYclovir (VALTREX) 500 MG tablet, Take twice daily as needed for herpes virus outbreaks    Past medical history, social, surgical and family history all reviewed in electronic medical record.  No pertanent information unless stated regarding to the chief complaint.   Review of Systems:  No headache, visual changes, nausea, vomiting, diarrhea, constipation, dizziness, abdominal pain, skin rash, fevers, chills, night sweats, weight loss, swollen lymph nodes, body aches, joint swelling, muscle aches, chest pain, shortness of breath, mood changes.   Objective  There were no vitals taken for this visit. Systems examined below as of    General: No apparent distress alert and oriented x3 mood and affect normal, dressed appropriately.  HEENT: Pupils equal, extraocular movements intact  Respiratory: Patient's speak in full sentences and does not appear short of breath  Cardiovascular: No lower extremity edema, non  tender, no erythema  Skin: Warm dry intact with no signs of infection or rash on extremities or on axial skeleton.  Abdomen: Soft nontender  Neuro: Cranial nerves II through XII are intact, neurovascularly intact in all extremities with 2+ DTRs and 2+ pulses.  Lymph: No lymphadenopathy of posterior or anterior cervical chain or axillae  bilaterally.  Gait normal with good balance and coordination.  MSK:  Non tender with full range of motion and good stability and symmetric strength and tone of shoulders, elbows, wrist,  knee and ankles bilaterally.  Hip: Left ROM IR: 45 Deg, ER: 45 Deg, Flexion: 120 Deg, Extension: 100 Deg, Abduction: 45 Deg, Adduction: 45 Deg Strength IR: 5/5, ER: 5/5, Flexion: 5/5, Extension: 5/5, Abduction: 5/5, Adduction: 5/5 Pelvic alignment unremarkable to inspection and palpation. Standing hip rotation and gait without trendelenburg sign / unsteadiness. Greater trochanter without tenderness to palpation. No tenderness over piriformis and greater trochanter. No pain with FABER or FADIR. No SI joint tenderness and normal minimal SI movement. Contralateral hip unremarkable  MSK US performed of: *** This study was ordered, performed, and interpreted by Charlann Boxer D.O.  Hip: Trochanteric bursa without swelling or effusion. Acetabular labrum visualized and without tears, displacement, or effusion in joint. Femoral neck appears unremarkable without increased power doppler signal along Cortex.  IMPRESSION:  NORMAL ULTRASONOGRAPHIC EXAMINATION OF THE HIP.   Impression and Recommendations:     This case required medical decision making of moderate complexity. The above documentation has been reviewed and is accurate and complete Lyndal Pulley, DO       Note: This dictation was prepared with Dragon dictation along with smaller phrase technology. Any transcriptional errors that result from this process are unintentional.

## 2019-03-28 ENCOUNTER — Other Ambulatory Visit: Payer: Self-pay

## 2019-03-28 ENCOUNTER — Emergency Department (HOSPITAL_COMMUNITY)
Admission: EM | Admit: 2019-03-28 | Discharge: 2019-03-28 | Disposition: A | Discharge status: Home / Self Care | Payer: BC Managed Care – PPO | Attending: Emergency Medicine | Admitting: Emergency Medicine

## 2019-03-28 ENCOUNTER — Encounter (HOSPITAL_COMMUNITY): Payer: Self-pay | Admitting: Emergency Medicine

## 2019-03-28 DIAGNOSIS — R631 Polydipsia: Secondary | ICD-10-CM | POA: Diagnosis not present

## 2019-03-28 DIAGNOSIS — F329 Major depressive disorder, single episode, unspecified: Secondary | ICD-10-CM | POA: Diagnosis not present

## 2019-03-28 DIAGNOSIS — Z91010 Allergy to peanuts: Secondary | ICD-10-CM | POA: Insufficient documentation

## 2019-03-28 DIAGNOSIS — I10 Essential (primary) hypertension: Secondary | ICD-10-CM | POA: Insufficient documentation

## 2019-03-28 DIAGNOSIS — E1165 Type 2 diabetes mellitus with hyperglycemia: Secondary | ICD-10-CM | POA: Insufficient documentation

## 2019-03-28 DIAGNOSIS — Z87891 Personal history of nicotine dependence: Secondary | ICD-10-CM | POA: Diagnosis not present

## 2019-03-28 DIAGNOSIS — R358 Other polyuria: Secondary | ICD-10-CM | POA: Diagnosis present

## 2019-03-28 DIAGNOSIS — R739 Hyperglycemia, unspecified: Secondary | ICD-10-CM

## 2019-03-28 DIAGNOSIS — J45909 Unspecified asthma, uncomplicated: Secondary | ICD-10-CM | POA: Diagnosis not present

## 2019-03-28 LAB — CBC
HCT: 41.4 % (ref 36.0–46.0)
Hemoglobin: 14 g/dL (ref 12.0–15.0)
MCH: 29.8 pg (ref 26.0–34.0)
MCHC: 33.8 g/dL (ref 30.0–36.0)
MCV: 88.1 fL (ref 80.0–100.0)
Platelets: 350 10*3/uL (ref 150–400)
RBC: 4.7 MIL/uL (ref 3.87–5.11)
RDW: 13.2 % (ref 11.5–15.5)
WBC: 9.5 10*3/uL (ref 4.0–10.5)
nRBC: 0 % (ref 0.0–0.2)

## 2019-03-28 LAB — URINALYSIS, ROUTINE W REFLEX MICROSCOPIC
Bacteria, UA: NONE SEEN
Bilirubin Urine: NEGATIVE
Glucose, UA: >=500 mg/dL — AB
Ketones, ur: NEGATIVE mg/dL
Leukocytes,Ua: NEGATIVE
Nitrite: NEGATIVE
Protein, ur: NEGATIVE mg/dL
Specific Gravity, Urine: 1.014 (ref 1.005–1.030)
pH: 5 (ref 5.0–8.0)

## 2019-03-28 LAB — I-STAT BETA HCG BLOOD, ED (MC, WL, AP ONLY): I-stat hCG, quantitative: <5 m[IU]/mL (ref <5)

## 2019-03-28 LAB — COMPREHENSIVE METABOLIC PANEL
ALT: 30 U/L (ref 0–44)
AST: 23 U/L (ref 15–41)
Albumin: 4.6 g/dL (ref 3.5–5.0)
Alkaline Phosphatase: 179 U/L — ABNORMAL HIGH (ref 38–126)
Anion gap: 9 (ref 5–15)
BUN: 13 mg/dL (ref 6–20)
CO2: 25 mmol/L (ref 22–32)
Calcium: 9.2 mg/dL (ref 8.9–10.3)
Chloride: 105 mmol/L (ref 98–111)
Creatinine, Ser: 0.7 mg/dL (ref 0.44–1.00)
GFR calc Af Amer: >60 mL/min (ref >60)
GFR calc non Af Amer: >60 mL/min (ref >60)
Glucose, Bld: 199 mg/dL — ABNORMAL HIGH (ref 70–99)
Potassium: 3.5 mmol/L (ref 3.5–5.1)
Sodium: 139 mmol/L (ref 135–145)
Total Bilirubin: 1.2 mg/dL (ref 0.3–1.2)
Total Protein: 7.6 g/dL (ref 6.5–8.1)

## 2019-03-28 LAB — CBG MONITORING, ED: Glucose-Capillary: 195 mg/dL — ABNORMAL HIGH (ref 70–99)

## 2019-03-28 LAB — LIPASE, BLOOD: Lipase: 31 U/L (ref 11–51)

## 2019-03-28 MED ORDER — METFORMIN HCL 500 MG PO TABS: 500.0000 mg | ORAL_TABLET | Freq: Two times a day (BID) | ORAL | 0 refills | Status: DC

## 2019-03-28 NOTE — Discharge Instructions (Signed)
As we discussed, though your blood sugar is running high, it is not dangerous at this time. I am re-starting your Metformin until you can follow up with your clinic doctor.  Please continue your medications and follow up with your regular doctor as recommended in these documents.  If you develop new or worsening symptoms that concern you, please return to the Emergency Department.

## 2019-03-28 NOTE — ED Triage Notes (Signed)
Patient here from home with complaints of hyperglycemia, blurred vision, and polydipsia. Reports that today blood sugars have been from 100-400. Known diabetic controlled with diet and exercise, no meds.

## 2019-03-28 NOTE — ED Provider Notes (Signed)
Emergency Department Provider Note   I have reviewed the triage vital signs and the nursing notes.   HISTORY  Chief Complaint Hyperglycemia; Blurred Vision; and Polydipsia   HPI [name] is a 58 y.o. female with PMH of DM, HTN, IBS, and seasonal allergies presents to the emergency department for evaluation of polyuria, polydipsia, mild blurry vision.  Symptoms have been present over the past several days.  Patient has known history of diabetes but states she is controlled it with diet and exercise up until this point.  She reports that over the last several weeks she has fallen off of her diet.  She has been checking her blood sugars at home over the last several days and after eating will frequently get readings into the 300s and 400s.  She states that she is waking multiple times in the night to drink water and urinate.  Her blurry vision is mild and bilateral.  She had been taking metformin but after changing her diet was able to get off this medication under the supervision of her PCP.   Past Medical History:  Diagnosis Date  . Allergy    Dr Caprice Red  . Cardiac arrhythmia   . Depression   . Diabetes mellitus without complication ([place])   . Fibroids    uterine sees Dr. Carlota Raspberry in [place]   . Genital warts   . Heart murmur   . Hypertension   . Irritable bowel syndrome (IBS)   . Low back pain    has a herniated lumbar disc sees Dr Marlou Sa    Patient Active Problem List   Diagnosis Date Noted  . Left lumbar radiculopathy 12/25/2018  . Right arm pain 11/26/2018  . Low back pain 11/26/2018  . Left knee pain 10/01/2018  . 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 Type 2 diabetes mellitus without complication, without Francia Verry-term current use of insulin ([place]) 02/22/2017  . Loss of transverse plantar arch 10/31/2016  . Pronation deformity of ankle, acquired 10/31/2016  . Patellofemoral pain syndrome 09/23/2015  . Diabetes mellitus, type II ([place]) 04/27/2015  . OSA  (obstructive sleep apnea) 03/31/2014  . Asthma, chronic 08/14/2013  . Leg edema 08/14/2013  . Costochondritis 04/10/2013  . Osteoarthritis 09/11/2010  . SYNCOPE 07/12/2010  . VAGINITIS, BACTERIAL 06/28/2010  . GERD 03/17/2010  . PALPITATIONS 03/17/2010  . CHEST PAIN 03/17/2010  . ACUTE BRONCHITIS 01/23/2010  . CANDIDIASIS OF VULVA AND VAGINA 01/06/2010  . DEPRESSION, MODERATE, RECURRENT 01/06/2010  . LOW BACK PAIN 01/06/2010  . SCIATICA 08/30/2009  . TROCHANTERIC BURSITIS 08/09/2009  . ABDOMINAL PAIN RIGHT LOWER QUADRANT 01/12/2008  . MORTON'S NEUROMA 09/30/2007  . ETHMOID SINUSITIS 09/30/2007  . LEG PAIN, BILATERAL 09/30/2007  . Abdominal pain, other specified site 09/30/2007  . DEPRESSION 09/22/2007  . Essential hypertension 09/22/2007    Past Surgical History:  Procedure Laterality Date  . ABDOMINOPLASTY  04/2013  . COLONOSCOPY  02-10-15   per Dr. Hilarie Fredrickson, clear, repeat in 10 yrs   . LUMBAR LAMINECTOMY     L3-4 on 02-24-10 per Dr Louanne Skye   . MYOMECTOMY     laser ablation of uterine fibroids per Dr Ouida Sills 2000  . TUBAL LIGATION      Allergies Hctz [hydrochlorothiazide]; Iodinated diagnostic agents; Peanut-containing drug products; Ciprofloxacin; Shellfish-derived products; and Cephalexin  Family History  Problem Relation Age of Onset  . Arthritis Other        Family Hx  . Diabetes Other        Family Hx  1st degree relative  . Hyperlipidemia Other        Family Hx  . Hypertension Other        Family Hx  . Prostate cancer Other        Family Hx 1st Degree relative <50  . Stroke Other        First Degree Female <50  . Diabetes Mother   . Hypertension Mother   . Diabetes Father   . Hypertension Father   . Diabetes Maternal Grandmother   . Hypertension Maternal Grandmother   . Diabetes Maternal Grandfather   . Hypertension Maternal Grandfather   . Diabetes Paternal Grandmother   . Hypertension Paternal Grandmother   . Diabetes Paternal Grandfather   .  Hypertension Paternal Grandfather   . Colon cancer Neg Hx   . Breast cancer Neg Hx     Social History Social History   Tobacco Use  . Smoking status: Former Smoker    Packs/day: 0.50    Years: 1.00    Pack years: 0.50    Types: Cigarettes    Last attempt to quit: 12/17/1996    Years since quitting: 22.2  . Smokeless tobacco: Never Used  Substance Use Topics  . Alcohol use: No    Alcohol/week: 0.0 standard drinks  . Drug use: No    Review of Systems  Constitutional: No fever/chills Eyes: Positive blurry vision.  ENT: No sore throat. Polydipsia.  Cardiovascular: Denies chest pain. Respiratory: Denies shortness of breath. Gastrointestinal: No abdominal pain.  No nausea, no vomiting.  No diarrhea.  No constipation. Genitourinary: Polyuria with mild dysuria.  Musculoskeletal: Negative for back pain. Skin: Negative for rash. Neurological: Negative for headaches, focal weakness or numbness.  10-point ROS otherwise negative.  ____________________________________________   PHYSICAL EXAM:  VITAL SIGNS: ED Triage Vitals  Enc Vitals Group     BP 03/28/19 1929 (!) 141/92     Pulse Rate 03/28/19 1929 87     Resp 03/28/19 1929 16     Temp 03/28/19 1929 98.3 F (36.8 C)     Temp Source 03/28/19 1929 Oral     SpO2 03/28/19 1929 98 %     Weight 03/28/19 1929 185 lb 3.2 oz (84 kg)     Height 03/28/19 1929 5' 3.25" (1.607 m)     Pain Score 03/28/19 1937 4   Constitutional: Alert and oriented. Well appearing and in no acute distress. Eyes: Conjunctivae are normal.  Head: Atraumatic. Nose: No congestion/rhinnorhea. Mouth/Throat: Mucous membranes are moist. Neck: No stridor.  Cardiovascular: Normal rate, regular rhythm. Good peripheral circulation. Grossly normal heart sounds.   Respiratory: Normal respiratory effort.  No retractions. Lungs CTAB. Gastrointestinal: Soft and nontender. No distention.  Musculoskeletal: No lower extremity tenderness nor edema. No gross  deformities of extremities. Neurologic:  Normal speech and language. No gross focal neurologic deficits are appreciated.  Skin:  Skin is warm, dry and intact. No rash noted.  ____________________________________________   LABS (all labs ordered are listed, but only abnormal results are displayed)  Labs Reviewed  URINALYSIS, ROUTINE W REFLEX MICROSCOPIC - Abnormal; Notable for the following components:      Result Value   Glucose, UA >=500 (*)    Hgb urine dipstick MODERATE (*)    All other components within normal limits  COMPREHENSIVE METABOLIC PANEL - Abnormal; Notable for the following components:   Glucose, Bld 199 (*)    Alkaline Phosphatase 179 (*)    All other components within normal limits  CBG MONITORING,  ED - Abnormal; Notable for the following components:   Glucose-Capillary 195 (*)    All other components within normal limits  CBC  LIPASE, BLOOD  CBG MONITORING, ED  I-STAT BETA HCG BLOOD, ED ([facility], [facility], [facility] ONLY)   ____________________________________________   PROCEDURES  Procedure(s) performed:   Procedures  None  ____________________________________________   INITIAL IMPRESSION / ASSESSMENT AND PLAN / ED COURSE  Pertinent labs & imaging results that were available during my care of the patient were reviewed by me and considered in my medical decision making (see chart for details).   Presents to the emergency department with symptoms of hyperglycemia.  She is been tracking her blood sugars at home and they have been elevated.  Lower suspicion for DKA clinically.  Plan for screening labs.  Patient does report some mild dysuria.  Will obtain UA.  Talk to her about restarting her metformin and getting back on her diet. Patient in agreement.   No DKA by labs. UA without infection. Restarting Metformin and patient to call PCP on Monday.  ____________________________________________  FINAL CLINICAL IMPRESSION(S) / ED DIAGNOSES  Final diagnoses:  Hyperglycemia   Polydipsia     NEW OUTPATIENT MEDICATIONS STARTED DURING THIS VISIT:  Discharge Medication List as of 03/28/2019  9:24 PM    START taking these medications   Details  metFORMIN (GLUCOPHAGE) 500 MG tablet Take 1 tablet (500 mg total) by mouth 2 (two) times daily with a meal for 30 days., Starting Sat 03/28/2019, Until Mon 04/27/2019, Print        Note:  This document was prepared using Dragon voice recognition software and may include unintentional dictation errors.  Nanda Quinton, MD Emergency Medicine    Shawndrea Rutkowski, [facility], MD 03/29/19 [phone]

## 2019-03-28 NOTE — ED Notes (Signed)
Urine culture sent to the lab. 

## 2019-03-30 ENCOUNTER — Other Ambulatory Visit: Payer: Self-pay

## 2019-03-30 ENCOUNTER — Encounter: Payer: Self-pay | Admitting: Family Medicine

## 2019-03-30 ENCOUNTER — Ambulatory Visit (INDEPENDENT_AMBULATORY_CARE_PROVIDER_SITE_OTHER): Payer: BC Managed Care – PPO | Admitting: Family Medicine

## 2019-03-30 ENCOUNTER — Ambulatory Visit: Payer: BC Managed Care – PPO | Admitting: Family Medicine

## 2019-03-30 DIAGNOSIS — E119 Type 2 diabetes mellitus without complications: Secondary | ICD-10-CM

## 2019-03-30 MED ORDER — METFORMIN HCL 500 MG PO TABS: 500.0000 mg | ORAL_TABLET | Freq: Two times a day (BID) | ORAL | 5 refills | Status: DC

## 2019-03-30 MED ORDER — GLIPIZIDE 5 MG PO TABS: 5.0000 mg | ORAL_TABLET | Freq: Two times a day (BID) | ORAL | 3 refills | Status: DC

## 2019-03-30 NOTE — Progress Notes (Signed)
Subjective:    Patient ID: [name], female    DOB: [birth year], 58 y.o.   MRN: [id number]  HPI Virtual Visit via Video Note  I connected with the patient on 03/30/19 at  2:45 PM EDT by a video enabled telemedicine application and verified that I am speaking with the correct person using two identifiers.  Location patient: home Location provider:work or home office Persons participating in the virtual visit: patient, provider  I discussed the limitations of evaluation and management by telemedicine and the availability of in person appointments. The patient expressed understanding and agreed to proceed.   HPI: Here to follow up on an ER visit on 03-28-19 for increased thirst and urination, and blurry vision. She had been doing controlling her diabetes well last fall with a strict diet and exercise regimen, and she was able to come off the Metformin for awhile. Her last A1c on 11-11-18 was 6.7. However over the past few months she admits that she got off the diet and her glucoses had been running in the 300s and 400s most days. At the ER her exam was normal, and all her labs looked good except for the glucose. She was started back on Metformin 500 mg bid. Today she feels a little better but her glucoses are still running in the 200s.    ROS: See pertinent positives and negatives per HPI.  Past Medical History:  Diagnosis Date  . Allergy    Dr Stevphen [place]  . Cardiac arrhythmia   . Depression   . Diabetes mellitus without complication (HCC)   . Fibroids    uterine sees Dr. Neva Seat in [place]   . Genital warts   . Heart murmur   . Hypertension   . Irritable bowel syndrome (IBS)   . Low back pain    has a herniated lumbar disc sees Dr August Saucer    Past Surgical History:  Procedure Laterality Date  . ABDOMINOPLASTY  04/2013  . COLONOSCOPY  02-10-15   per Dr. Rhea Belton, clear, repeat in 10 yrs   . LUMBAR LAMINECTOMY     L3-4 on 02-24-10 per Dr Otelia Sergeant   . MYOMECTOMY     laser  ablation of uterine fibroids per Dr Dareen Piano 2000  . TUBAL LIGATION      Family History  Problem Relation Age of Onset  . Arthritis Other        Family Hx  . Diabetes Other        Family Hx 1st degree relative  . Hyperlipidemia Other        Family Hx  . Hypertension Other        Family Hx  . Prostate cancer Other        Family Hx 1st Degree relative <50  . Stroke Other        First Degree Female <50  . Diabetes Mother   . Hypertension Mother   . Diabetes Father   . Hypertension Father   . Diabetes Maternal Grandmother   . Hypertension Maternal Grandmother   . Diabetes Maternal Grandfather   . Hypertension Maternal Grandfather   . Diabetes Paternal Grandmother   . Hypertension Paternal Grandmother   . Diabetes Paternal Grandfather   . Hypertension Paternal Grandfather   . Colon cancer Neg Hx   . Breast cancer Neg Hx      Current Outpatient Medications:  .  albuterol (PROVENTIL HFA;VENTOLIN HFA) 108 (90 Base) MCG/ACT inhaler, Inhale 2 puffs into the lungs every 4 (four)  hours as needed for wheezing or shortness of breath., Disp: 1 Inhaler, Rfl: 11 .  amLODipine (NORVASC) 10 MG tablet, Take 1 tablet (10 mg total) by mouth daily., Disp: 90 tablet, Rfl: 3 .  atorvastatin (LIPITOR) 20 MG tablet, TAKE ONE TABLET BY MOUTH DAILY, Disp: 90 tablet, Rfl: 10 .  cholecalciferol (VITAMIN D) 1000 UNITS tablet, Take 1,000 Units by mouth daily. , Disp: , Rfl:  .  escitalopram (LEXAPRO) 10 MG tablet, Take 10 mg by mouth daily., Disp: , Rfl:  .  glipiZIDE (GLUCOTROL) 5 MG tablet, Take 1 tablet (5 mg total) by mouth 2 (two) times daily before a meal., Disp: 60 tablet, Rfl: 3 .  lisinopril (PRINIVIL,ZESTRIL) 20 MG tablet, Take 1 tablet (20 mg total) by mouth 2 (two) times daily., Disp: 180 tablet, Rfl: 3 .  Magnesium 500 MG TABS, Take by mouth daily., Disp: , Rfl:  .  metFORMIN (GLUCOPHAGE) 500 MG tablet, Take 1 tablet (500 mg total) by mouth 2 (two) times daily with a meal for 30 days., Disp: 60  tablet, Rfl: 5 .  Multiple Vitamins-Minerals (MULTIVITAMIN WITH MINERALS) tablet, Take 1 tablet by mouth daily., Disp: , Rfl:  .  NON FORMULARY, , Disp: , Rfl:  .  potassium chloride SA (K-DUR,KLOR-CON) 20 MEQ tablet, Take 1 tablet (20 mEq total) by mouth daily., Disp: 90 tablet, Rfl: 3 .  predniSONE (DELTASONE) 50 MG tablet, , Disp: , Rfl:  .  valACYclovir (VALTREX) 500 MG tablet, Take twice daily as needed for herpes virus outbreaks, Disp: 30 tablet, Rfl: 5  EXAM:  VITALS per patient if applicable:  GENERAL: alert, oriented, appears well and in no acute distress  HEENT: atraumatic, conjunttiva clear, no obvious abnormalities on inspection of external nose and ears  NECK: normal movements of the head and neck  LUNGS: on inspection no signs of respiratory distress, breathing rate appears normal, no obvious gross SOB, gasping or wheezing  CV: no obvious cyanosis  MS: moves all visible extremities without noticeable abnormality  PSYCH/NEURO: pleasant and cooperative, no obvious depression or anxiety, speech and thought processing grossly intact  ASSESSMENT AND PLAN: Her diabetes is not well controlled. She has already gotten back on her strict diet and I encouraged her to stay on this. She will take the Metformin as above, and we willl add Glipizide 5 mg BID. She will come in for an A1c in the next few days. Recheck in 4 weeks.  Gershon Crane, MD  Discussed the following assessment and plan:  Type 2 diabetes mellitus without complication, without long-term current use of insulin (HCC) - Plan: Hemoglobin A1c     I discussed the assessment and treatment plan with the patient. The patient was provided an opportunity to ask questions and all were answered. The patient agreed with the plan and demonstrated an understanding of the instructions.   The patient was advised to call back or seek an in-person evaluation if the symptoms worsen or if the condition fails to improve as anticipated.      Review of Systems     Objective:   Physical Exam        Assessment & Plan:

## 2019-03-31 LAB — HEMOGLOBIN A1C: Hgb A1c MFr Bld: 9.1 % — ABNORMAL HIGH (ref 4.6–6.5)

## 2019-03-31 NOTE — Addendum Note (Signed)
Addended by: Elmer Picker on: 03/31/2019 08:04 AM   Modules accepted: Orders

## 2019-04-01 ENCOUNTER — Encounter: Payer: Self-pay | Admitting: *Deleted

## 2019-04-11 ENCOUNTER — Encounter: Payer: Self-pay | Admitting: Family Medicine

## 2019-04-11 LAB — HM DIABETES EYE EXAM

## 2019-04-14 ENCOUNTER — Encounter: Payer: Self-pay | Admitting: Family Medicine

## 2019-04-14 NOTE — Telephone Encounter (Signed)
Dr. Fry please advise. Thanks  

## 2019-04-14 NOTE — Telephone Encounter (Signed)
Tell her to stop the Glipizide, but to stay on Metformin. Check back with [place] in one week

## 2019-04-23 ENCOUNTER — Encounter: Payer: Self-pay | Admitting: Family Medicine

## 2019-04-23 NOTE — Telephone Encounter (Signed)
Dr. Fry please advise. Thanks  

## 2019-04-24 MED ORDER — ESCITALOPRAM OXALATE 10 MG PO TABS: 10.0000 mg | ORAL_TABLET | Freq: Every day | ORAL | 5 refills | Status: DC

## 2019-04-27 NOTE — Telephone Encounter (Signed)
Take the Metformin in the mornings only. Do not take the evening dose

## 2019-05-15 IMAGING — XA Imaging study
2 series · 2 of 2 positions shown · non-contrast
Comparison: none

CLINICAL DATA: Lumbosacral spondylosis without myelopathy. Chronic
back pain with left greater than right leg pain. Left L4-L5
interlaminar injection requested. No prior injections.

[Series 1: ortho standard · 1 of 1 slices shown (1 of 2)]
[im 1/1]
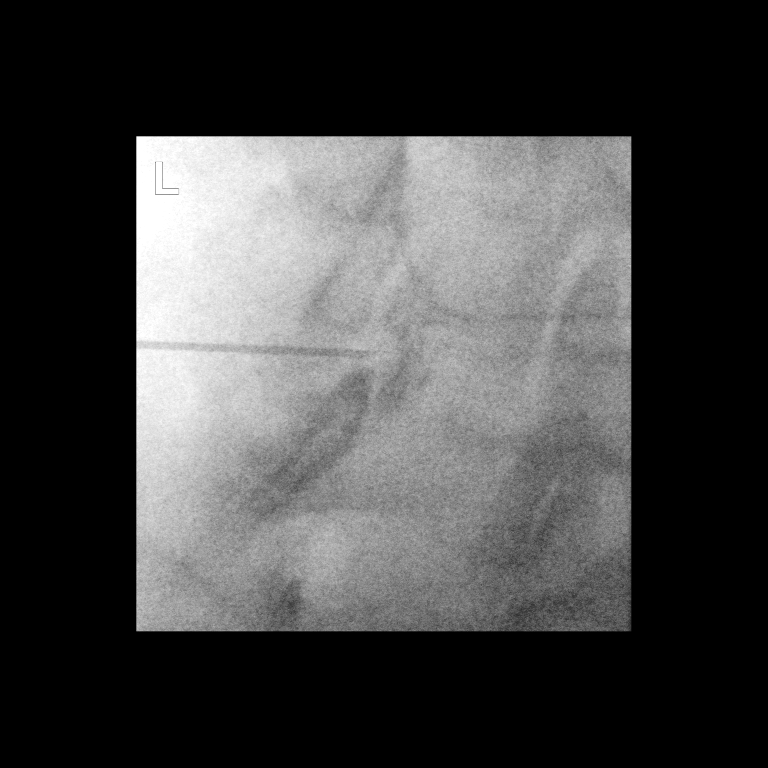

[Series 2: ortho standard · 1 of 1 slices shown (2 of 2)]
[im 1/1]
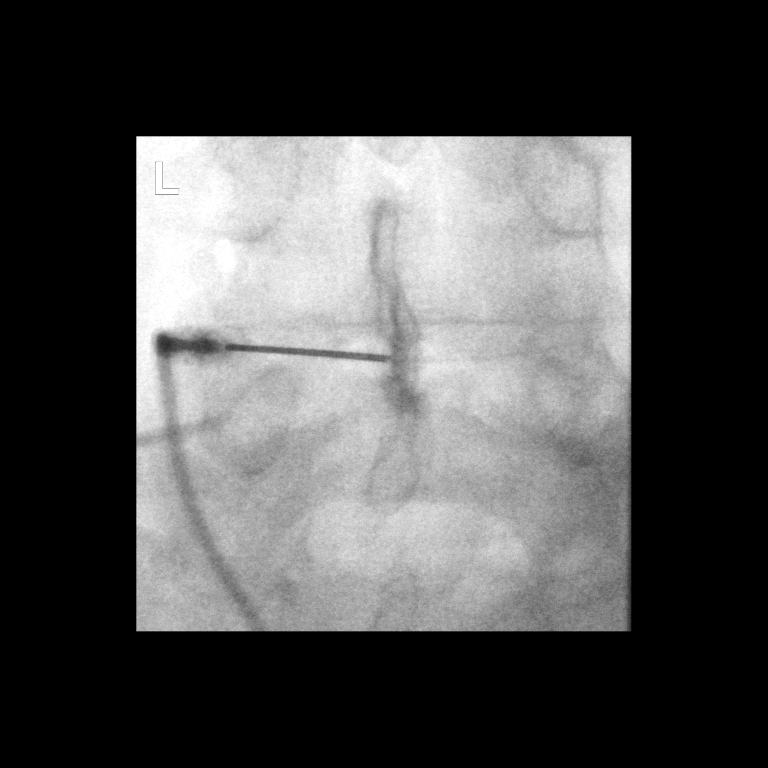

[2 of 2 positions shown; findings below may reference images not displayed]

FLUOROSCOPY TIME:  Radiation Exposure Index (as provided by the
fluoroscopic device): 2.7 mGy

Number of Acquired Images:  0

PROCEDURE:
The procedure, risks, benefits, and alternatives were explained to
the patient. Questions regarding the procedure were encouraged and
answered. The patient understands and consents to the procedure.

LUMBAR EPIDURAL INJECTION:

An interlaminar approach was performed on the left at L4-L5. The
overlying skin was cleansed and anesthetized. A 3.5 inch 20 gauge
epidural needle was advanced using loss-of-resistance technique.

DIAGNOSTIC EPIDURAL INJECTION:

Injection of Isovue-M 200 shows a good epidural pattern with spread
above and below the level of needle placement, primarily in the
midline. No vascular opacification is seen.

THERAPEUTIC EPIDURAL INJECTION:

120 mg of Depo-Medrol mixed with 3 mL of 1% lidocaine were
instilled. The procedure was well-tolerated, and the patient was
discharged thirty minutes following the injection in good condition.

COMPLICATIONS:
None immediate.
IMPRESSION: Technically successful epidural injection on the left at L4-L5 #1.

## 2019-05-20 ENCOUNTER — Encounter: Payer: Self-pay | Admitting: Family Medicine

## 2019-05-20 NOTE — Telephone Encounter (Signed)
Dr Fry please advise. thanks 

## 2019-05-21 MED ORDER — ESCITALOPRAM OXALATE 20 MG PO TABS: 20.0000 mg | ORAL_TABLET | Freq: Every day | ORAL | 5 refills | Status: DC

## 2019-05-21 NOTE — Telephone Encounter (Signed)
Increase the Lexapro to 20 mg daily. Call in #30 with 5 rf

## 2019-06-10 ENCOUNTER — Ambulatory Visit: Payer: Self-pay | Admitting: Family Medicine

## 2019-06-10 ENCOUNTER — Telehealth: Payer: Self-pay | Admitting: *Deleted

## 2019-06-10 ENCOUNTER — Ambulatory Visit (INDEPENDENT_AMBULATORY_CARE_PROVIDER_SITE_OTHER): Payer: BC Managed Care – PPO | Admitting: Family Medicine

## 2019-06-10 ENCOUNTER — Encounter: Payer: Self-pay | Admitting: Family Medicine

## 2019-06-10 ENCOUNTER — Other Ambulatory Visit: Payer: Self-pay

## 2019-06-10 DIAGNOSIS — R5383 Other fatigue: Secondary | ICD-10-CM

## 2019-06-10 DIAGNOSIS — R070 Pain in throat: Secondary | ICD-10-CM

## 2019-06-10 DIAGNOSIS — Z20828 Contact with and (suspected) exposure to other viral communicable diseases: Secondary | ICD-10-CM

## 2019-06-10 DIAGNOSIS — R51 Headache: Secondary | ICD-10-CM

## 2019-06-10 DIAGNOSIS — R6889 Other general symptoms and signs: Secondary | ICD-10-CM

## 2019-06-10 NOTE — Telephone Encounter (Signed)
-----   Message from Elie Confer, [place] sent at 06/10/2019  4:00 PM EDT ----- Good afternoon!!  Dr. Sarajane Jews would like this person tested for Covid 19.  She has been having body aches, sore throat, headache and taste is off x 1 week.  Thank you.

## 2019-06-10 NOTE — Telephone Encounter (Signed)
Patient has an appointment with PCP 06/10/2019 at 2:45 PM

## 2019-06-10 NOTE — Telephone Encounter (Signed)
Pt reports exposure to covid 66 coworker. Reports now with sore throat, increased fatigue, altered sense of taste. Onset of symptoms 1 1/2 weeks ago. Rates sore throat at 3/10.  States is able to eat, staying hydrated. Denies fever, no cough, no SOB. Call transferred to practice, [facility], for consideration of appt/referral for testing.  Reason for Disposition . HIGH RISK patient (e.g., age > 40 years, diabetes, heart or lung disease, weak immune system)  Answer Assessment - Initial Assessment Questions 1. COVID-19 DIAGNOSIS: "Who made your Coronavirus (COVID-19) diagnosis?" "Was it confirmed by a positive lab test?" If not diagnosed by a HCP, ask "Are there lots of cases (community spread) where you live?" (See public health department website, if unsure)     NA 2. ONSET: "When did the COVID-19 symptoms start?"      1-1/2 weeks ago 3. WORST SYMPTOM: "What is your worst symptom?" (e.g., cough, fever, shortness of breath, muscle aches)     Sore throat, 3/10  Body aches, sense of taste "Almost gone, altered" 4. COUGH: "Do you have a cough?" If so, ask: "How bad is the cough?"       no 5. FEVER: "Do you have a fever?" If so, ask: "What is your temperature, how was it measured, and when did it start?"     no 6. RESPIRATORY STATUS: "Describe your breathing?" (e.g., shortness of breath, wheezing, unable to speak)      no 7. BETTER-SAME-WORSE: "Are you getting better, staying the same or getting worse compared to yesterday?"  If getting worse, ask, "In what way?"     worse 8. HIGH RISK DISEASE: "Do you have any chronic medical problems?" (e.g., asthma, heart or lung disease, weak immune system, etc.)   Copd  Risk at 4. 10. OTHER SYMPTOMS: "Do you have any other symptoms?"  (e.g., chills, fatigue, headache, loss of smell or taste, muscle pain, sore throat)      Increased fatigue altered sense of taste  Protocols used: CORONAVIRUS (COVID-19) DIAGNOSED OR SUSPECTED-A-AH

## 2019-06-10 NOTE — Progress Notes (Signed)
Subjective:    Patient ID: [name], female    DOB: 10/30/1961, 58 y.o.   MRN: [id number]  HPI Virtual Visit via Telephone Note  I connected with the patient on 06/10/19 at  2:45 PM EDT by telephone and verified that I am speaking with the correct person using two identifiers. We attempted to connect virtually but we had technical difficulties with the audio and video.    I discussed the limitations, risks, security and privacy concerns of performing an evaluation and management service by telephone and the availability of in person appointments. I also discussed with the patient that there may be a patient responsible charge related to this service. The patient expressed understanding and agreed to proceed.  Location patient: home Location provider: work or home office Participants present for the call: patient, provider Patient did not have a visit in the prior 7 days to address this/these issue(s).   History of Present Illness: Here to ask for Covid-19 testing. 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 She reports several days of feeling very tired, of body aches, of ST and headache, and of an altered sense of taste. Also she has learned that 5 of her coworkers recently tested positive for the virus.    Observations/Objective: Patient sounds cheerful and well on the phone. I do not appreciate any SOB. Speech and thought processing are grossly intact. Patient reported vitals:  Assessment and Plan: She seems to have a viral illness, so I advised her to drink plenty of liquids and to take Tylenol as needed for aches or fever. We will arrange for her to be tested for the Covid-19 virus.  Gershon Crane, MD   Follow Up Instructions:     [phone] 5-10 [phone] 11-20 [id number] 21-30 I did not refer this patient for an OV in the next 24 hours for this/these issue(s).  I discussed the  assessment and treatment plan with the patient. The patient was provided an opportunity to ask questions and all were answered. The patient agreed with the plan and demonstrated an understanding of the instructions.   The patient was advised to call back or seek an in-person evaluation if the symptoms worsen or if the condition fails to improve as anticipated.  I provided 12 minutes of non-face-to-face time during this encounter.   Gershon Crane, MD    Review of Systems     Objective:   Physical Exam        Assessment & Plan:

## 2019-06-10 NOTE — Telephone Encounter (Signed)
Pt called and left message to return call to schedule testing.

## 2019-06-10 NOTE — Telephone Encounter (Signed)
Left Vm to CB for covid testing.     Dr. Sarajane Jews would like this person tested for Covid 19. She has been having body aches, sore throat, headache and taste is off x 1 week.   Pts CB# [phone] [phone]

## 2019-06-11 ENCOUNTER — Other Ambulatory Visit: Payer: BC Managed Care – PPO

## 2019-06-11 ENCOUNTER — Telehealth: Payer: Self-pay | Admitting: *Deleted

## 2019-06-11 DIAGNOSIS — Z20822 Contact with and (suspected) exposure to covid-19: Secondary | ICD-10-CM

## 2019-06-11 NOTE — Telephone Encounter (Signed)
Referred by Dr.Fry due to sxs and exposure for Covid19. Appointment made for [facility] site today at 9:45a. Informed to wear a mask and stay in vehicle.

## 2019-06-16 LAB — NOVEL CORONAVIRUS, NAA: SARS-CoV-2, NAA: NOT DETECTED

## 2019-06-19 ENCOUNTER — Other Ambulatory Visit: Payer: Self-pay | Admitting: Family Medicine

## 2019-07-02 ENCOUNTER — Other Ambulatory Visit: Payer: Self-pay | Admitting: Family Medicine

## 2019-07-08 ENCOUNTER — Encounter: Payer: Self-pay | Admitting: Family Medicine

## 2019-07-09 ENCOUNTER — Telehealth: Payer: Self-pay | Admitting: Family Medicine

## 2019-07-09 NOTE — Telephone Encounter (Signed)
Application for handicapped driver registration plate to be filled out, placed in dr's folder.  Call [phone] upon completion.

## 2019-07-11 IMAGING — CT CT ABDOMEN AND PELVIS WITH CONTRAST
2 of 5 series · 15 of 46 positions shown, 17 images · IV contrast (OMNIPAQUE)
Comparison: May 09, 2018

CLINICAL DATA: Abdominal pain

EXAM:
CT ABDOMEN AND PELVIS WITH CONTRAST
TECHNIQUE: Multidetector CT imaging of the abdomen and pelvis was performed
using the standard protocol following bolus administration of
intravenous contrast.
CONTRAST:  100mL OMNIPAQUE IOHEXOL 300 MG/ML  SOLN
Patient received steroid prep for this study without adverse sequela
the time of contrast administration.

[Series 2: axial st · axial · 0.73mm/px · z∈[-436,-51]mm · 12 of 91 slices shown, 14 images]
[im 7/91  soft-tissue]
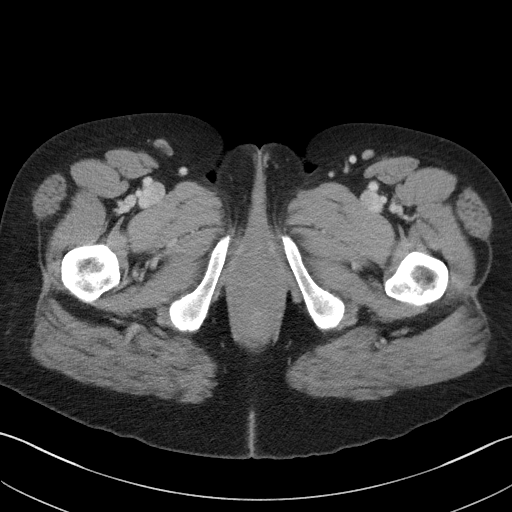
[im 7/91  bone]
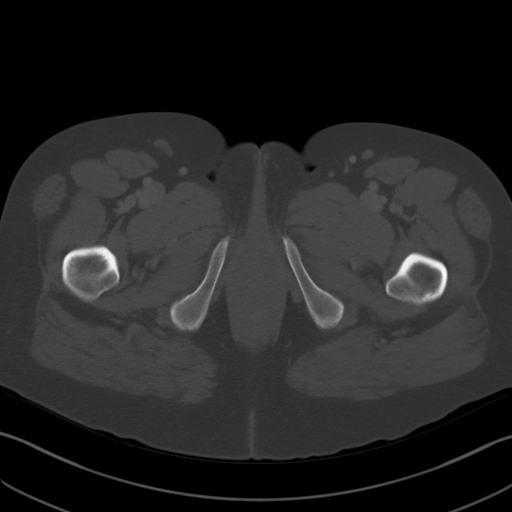
[im 13/91  soft-tissue]
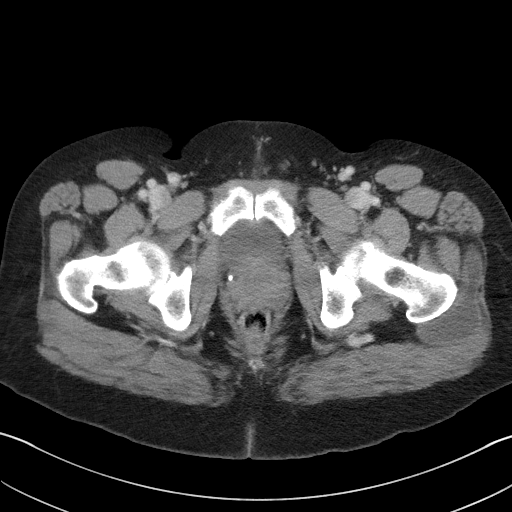
[im 20/91  soft-tissue]
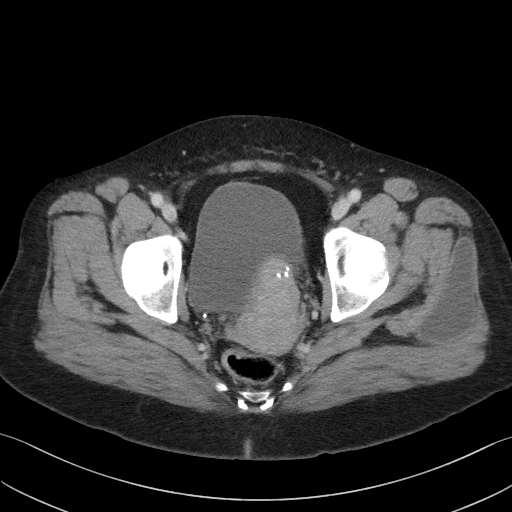
[im 26/91  soft-tissue]
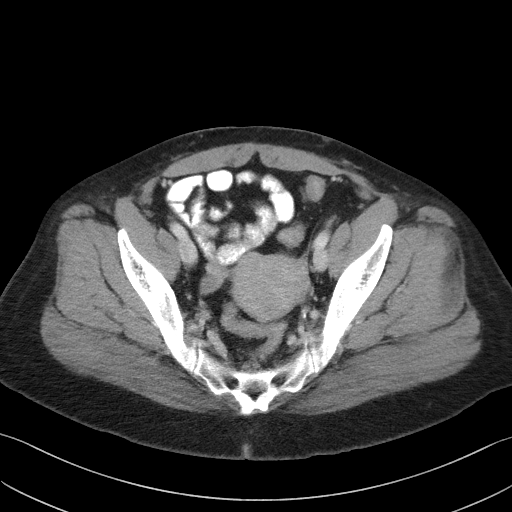
[im 33/91  soft-tissue]
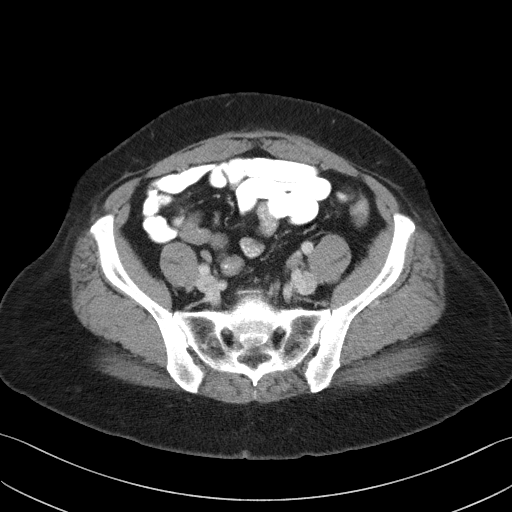
[im 39/91  soft-tissue]
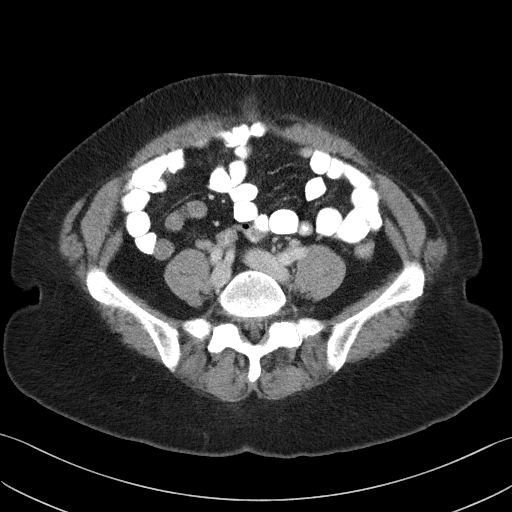
[im 52/91  soft-tissue]
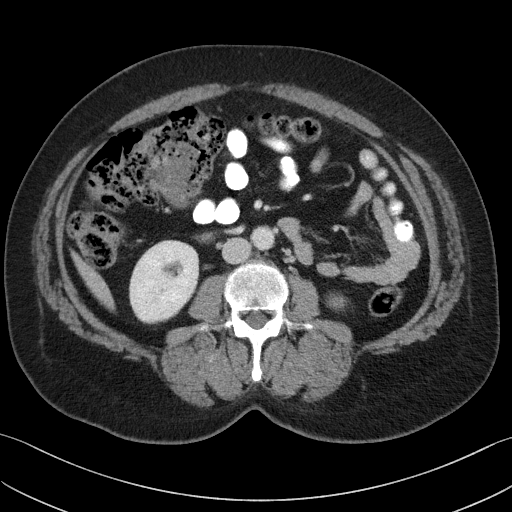
[im 58/91  soft-tissue]
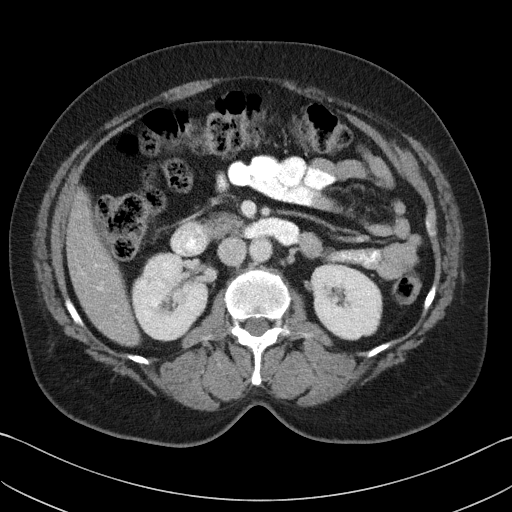
[im 65/91  soft-tissue]
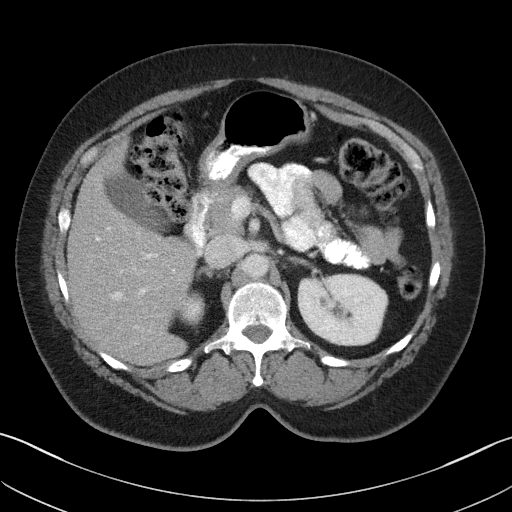
[im 65/91  bone]
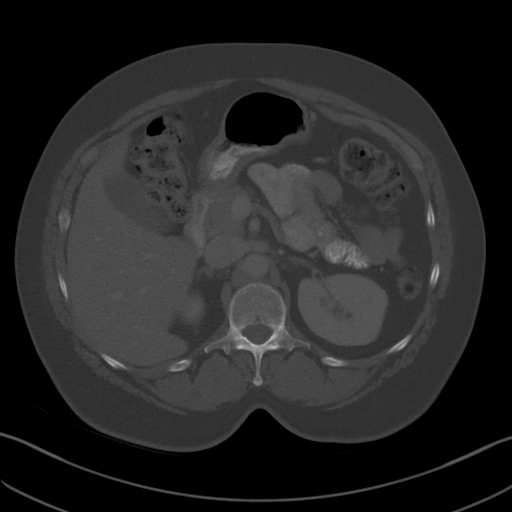
[im 71/91  soft-tissue]
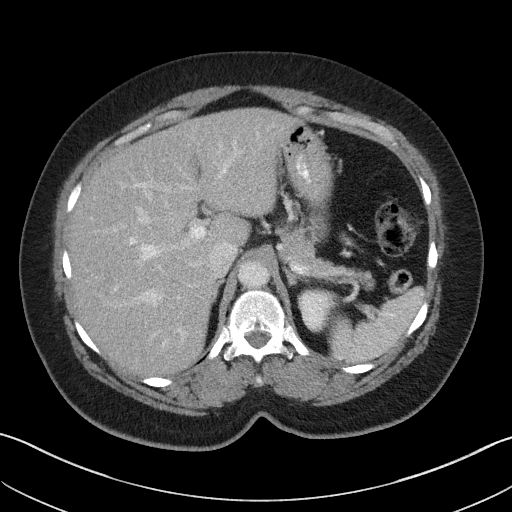
[im 78/91  soft-tissue]
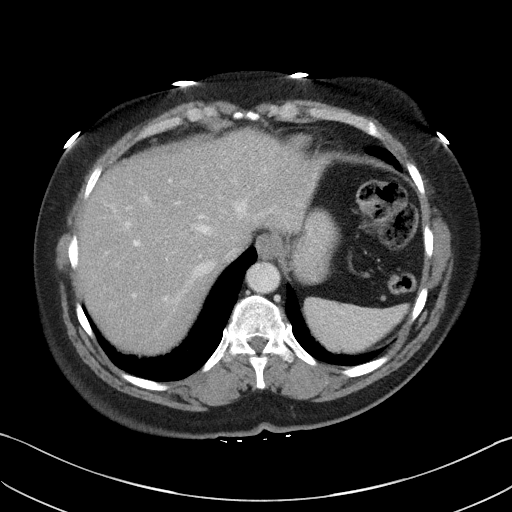
[im 84/91  soft-tissue]
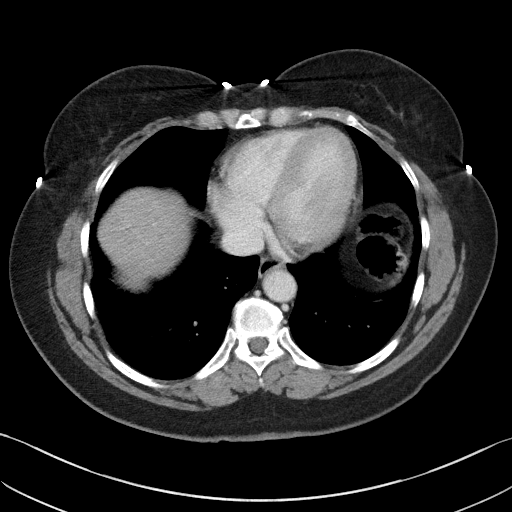

[Series 5: coronal st · coronal · 0.79mm/px · 3 of 115 slices shown]
[im 39/115  soft-tissue]
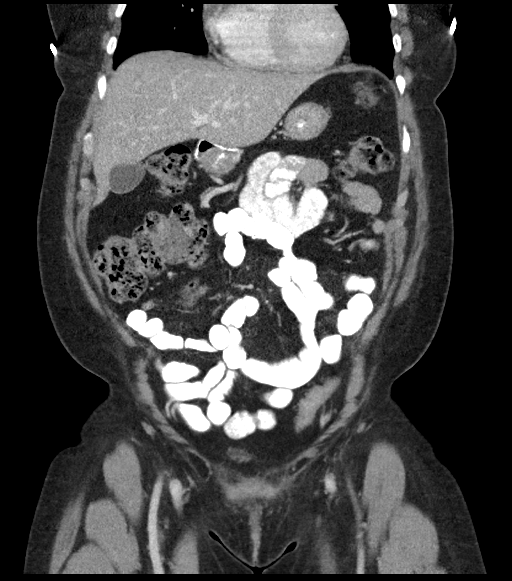
[im 51/115  soft-tissue]
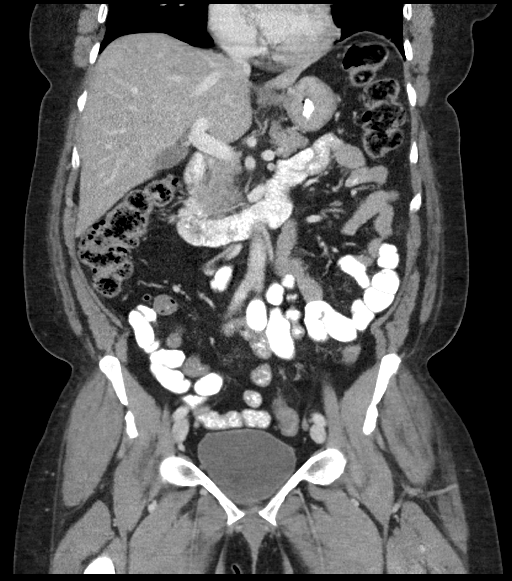
[im 64/115  soft-tissue]
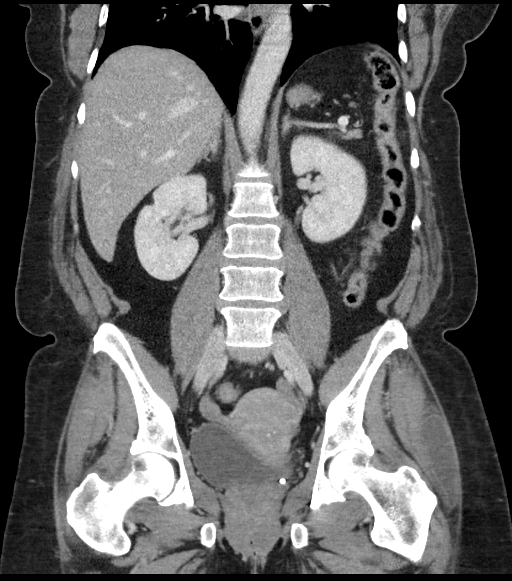

[15 of 46 positions shown; findings below may reference images not displayed]

FINDINGS: Lower chest: Lung bases are clear.

Hepatobiliary: There is hepatic steatosis. No focal liver lesions
are evident. Gallbladder wall is not appreciably thickened. There is
no biliary duct dilatation.

Pancreas: No pancreatic mass or inflammatory focus evident.

Spleen: No splenic lesions are appreciable.

Adrenals/Urinary Tract: Adrenals bilaterally appear normal. There is
an apparent cyst in the upper pole the right kidney measuring 8 x 6
mm. There is a 4 mm presumed cyst in the upper pole left kidney.
There is no evident hydronephrosis on either side. There is no
appreciable renal or ureteral calculus on either side. Urinary
bladder is midline with wall thickness within normal limits.

Stomach/Bowel: There is no appreciable bowel wall or mesenteric
thickening. There is no evident bowel obstruction. There is no free
air or portal venous air.

Vascular/Lymphatic: No abdominal aortic aneurysm. There is slight
aortic atherosclerosis in the aorta. No adenopathy is evident in the
abdomen or pelvis.

Reproductive: The uterus is anteverted. There is a partially
calcified mass along the inferior aspect of the uterine fundus
measuring 3.4 x 2.9 x 2.8 cm, an apparent leiomyoma. There is no
extrauterine pelvic mass.

Other: The appendix appears normal. There is no abscess or ascites
in the abdomen or pelvis. There is a small ventral hernia containing
only fat.

Musculoskeletal: There is degenerative change in the lower lumbar
spine. There are no blastic or lytic bone lesions. Slight
anterolisthesis of L4 on L5 is felt to be due to underlying
spondylosis. There is moderate disc bulging at this level. No
intramuscular lesions this area there is again noted a focal
well-circumscribed mass located just medial to the lateral aspect of
the tensor fascia Aksiva muscle, and anterior to the gluteus maximus
muscle. This fluid collection measures 8.2 x 7.9 x 3.7 cm and likely
represents a chronic liquified hematoma. No intramuscular lesion is
evident.
IMPRESSION: 1. No evident bowel obstruction. No abscess in the abdomen or
pelvis. Appendix appears normal.

2. Partially calcified uterine leiomyoma measuring 3.4 x 2.9 x
cm.

3. Stable presumed liquified hematoma in the lateral upper thigh
region just medial to the lateral aspect of the tensor fascia Aksiva
muscle, anterior to the gluteus maximus muscle. This focus appears
benign.

4.  No evident renal or ureteral calculus.  No hydronephrosis.

## 2019-07-14 NOTE — Telephone Encounter (Signed)
Patient calling to find out if her form has been completed yet?

## 2019-07-15 NOTE — Telephone Encounter (Signed)
Spoke with patient. She is aware that Dr. Sarajane Jews is out of the office until Monday and that I will call her once her form is complete.

## 2019-07-16 ENCOUNTER — Other Ambulatory Visit: Payer: Self-pay | Admitting: Family Medicine

## 2019-07-21 NOTE — Telephone Encounter (Signed)
The form is ready  

## 2019-07-21 NOTE — Telephone Encounter (Signed)
The patients form was picked up

## 2019-07-21 NOTE — Telephone Encounter (Signed)
Patient is aware. Form has been placed up front for pick up. 

## 2019-08-31 ENCOUNTER — Other Ambulatory Visit: Payer: Self-pay

## 2019-08-31 DIAGNOSIS — Z20822 Contact with and (suspected) exposure to covid-19: Secondary | ICD-10-CM

## 2019-09-01 LAB — NOVEL CORONAVIRUS, NAA: SARS-CoV-2, NAA: NOT DETECTED

## 2019-10-07 ENCOUNTER — Encounter (HOSPITAL_COMMUNITY): Payer: Self-pay

## 2019-10-07 ENCOUNTER — Emergency Department (HOSPITAL_COMMUNITY)
Admission: EM | Admit: 2019-10-07 | Discharge: 2019-10-07 | Disposition: A | Discharge status: Home / Self Care | Payer: Self-pay | Attending: Emergency Medicine | Admitting: Emergency Medicine

## 2019-10-07 ENCOUNTER — Emergency Department (HOSPITAL_COMMUNITY): Payer: Self-pay

## 2019-10-07 DIAGNOSIS — Z7984 Long term (current) use of oral hypoglycemic drugs: Secondary | ICD-10-CM | POA: Insufficient documentation

## 2019-10-07 DIAGNOSIS — R110 Nausea: Secondary | ICD-10-CM | POA: Insufficient documentation

## 2019-10-07 DIAGNOSIS — R109 Unspecified abdominal pain: Secondary | ICD-10-CM | POA: Insufficient documentation

## 2019-10-07 DIAGNOSIS — R079 Chest pain, unspecified: Secondary | ICD-10-CM | POA: Insufficient documentation

## 2019-10-07 DIAGNOSIS — J45909 Unspecified asthma, uncomplicated: Secondary | ICD-10-CM | POA: Insufficient documentation

## 2019-10-07 DIAGNOSIS — Z79899 Other long term (current) drug therapy: Secondary | ICD-10-CM | POA: Insufficient documentation

## 2019-10-07 DIAGNOSIS — R0602 Shortness of breath: Secondary | ICD-10-CM | POA: Insufficient documentation

## 2019-10-07 DIAGNOSIS — E119 Type 2 diabetes mellitus without complications: Secondary | ICD-10-CM | POA: Insufficient documentation

## 2019-10-07 DIAGNOSIS — I10 Essential (primary) hypertension: Secondary | ICD-10-CM | POA: Insufficient documentation

## 2019-10-07 DIAGNOSIS — Z87891 Personal history of nicotine dependence: Secondary | ICD-10-CM | POA: Insufficient documentation

## 2019-10-07 DIAGNOSIS — Z91010 Allergy to peanuts: Secondary | ICD-10-CM | POA: Insufficient documentation

## 2019-10-07 DIAGNOSIS — R55 Syncope and collapse: Secondary | ICD-10-CM | POA: Insufficient documentation

## 2019-10-07 LAB — CBC WITH DIFFERENTIAL/PLATELET
Abs Immature Granulocytes: 0.02 10*3/uL (ref 0.00–0.07)
Basophils Absolute: 0 10*3/uL (ref 0.0–0.1)
Basophils Relative: 0 %
Eosinophils Absolute: 0.2 10*3/uL (ref 0.0–0.5)
Eosinophils Relative: 3 %
HCT: 41.2 % (ref 36.0–46.0)
Hemoglobin: 13.6 g/dL (ref 12.0–15.0)
Immature Granulocytes: 0 %
Lymphocytes Relative: 30 %
Lymphs Abs: 2.9 10*3/uL (ref 0.7–4.0)
MCH: 29.4 pg (ref 26.0–34.0)
MCHC: 33 g/dL (ref 30.0–36.0)
MCV: 89.2 fL (ref 80.0–100.0)
Monocytes Absolute: 0.8 10*3/uL (ref 0.1–1.0)
Monocytes Relative: 8 %
Neutro Abs: 5.6 10*3/uL (ref 1.7–7.7)
Neutrophils Relative %: 59 %
Platelets: 370 10*3/uL (ref 150–400)
RBC: 4.62 MIL/uL (ref 3.87–5.11)
RDW: 13.7 % (ref 11.5–15.5)
WBC: 9.6 10*3/uL (ref 4.0–10.5)
nRBC: 0 % (ref 0.0–0.2)

## 2019-10-07 LAB — URINALYSIS, ROUTINE W REFLEX MICROSCOPIC
Bilirubin Urine: NEGATIVE
Glucose, UA: NEGATIVE mg/dL
Ketones, ur: NEGATIVE mg/dL
Leukocytes,Ua: NEGATIVE
Nitrite: NEGATIVE
Protein, ur: NEGATIVE mg/dL
Specific Gravity, Urine: 1.008 (ref 1.005–1.030)
pH: 7 (ref 5.0–8.0)

## 2019-10-07 LAB — COMPREHENSIVE METABOLIC PANEL
ALT: 38 U/L (ref 0–44)
AST: 23 U/L (ref 15–41)
Albumin: 4.2 g/dL (ref 3.5–5.0)
Alkaline Phosphatase: 145 U/L — ABNORMAL HIGH (ref 38–126)
Anion gap: 9 (ref 5–15)
BUN: 14 mg/dL (ref 6–20)
CO2: 27 mmol/L (ref 22–32)
Calcium: 9.2 mg/dL (ref 8.9–10.3)
Chloride: 104 mmol/L (ref 98–111)
Creatinine, Ser: 0.7 mg/dL (ref 0.44–1.00)
GFR calc Af Amer: >60 mL/min (ref >60)
GFR calc non Af Amer: >60 mL/min (ref >60)
Glucose, Bld: 166 mg/dL — ABNORMAL HIGH (ref 70–99)
Potassium: 3.7 mmol/L (ref 3.5–5.1)
Sodium: 140 mmol/L (ref 135–145)
Total Bilirubin: 0.5 mg/dL (ref 0.3–1.2)
Total Protein: 7 g/dL (ref 6.5–8.1)

## 2019-10-07 LAB — CBG MONITORING, ED
Glucose-Capillary: 154 mg/dL — ABNORMAL HIGH (ref 70–99)
Glucose-Capillary: 122 mg/dL — ABNORMAL HIGH (ref 70–99)

## 2019-10-07 LAB — LIPASE, BLOOD: Lipase: 33 U/L (ref 11–51)

## 2019-10-07 MED ORDER — ONDANSETRON HCL 4 MG/2ML IJ SOLN: 4.0000 mg | Freq: Once | INTRAMUSCULAR | Status: DC

## 2019-10-07 MED ORDER — SODIUM CHLORIDE 0.9 % IV BOLUS
500.0000 mL | Freq: Once | INTRAVENOUS | Status: AC
  Administered 2019-10-07: 500 mL via INTRAVENOUS

## 2019-10-07 MED ORDER — SODIUM CHLORIDE 0.9 % IV SOLN
INTRAVENOUS | Status: DC
  Administered 2019-10-07: 16:00:00 via INTRAVENOUS

## 2019-10-07 NOTE — ED Triage Notes (Signed)
Patient arrived by EMS from work with CC of syncopal episode. Patient reports she became hot, with abdominal pain, and nausea. EMS reports patient initially refused to come to ED, but before leaving patient, patient had another syncopal episode. Patient A&O X4 with generalized weakness.

## 2019-10-07 NOTE — ED Notes (Signed)
PT REQUESTED HER CBG BE CHECKED.

## 2019-10-07 NOTE — Discharge Instructions (Addendum)
Work-up to include CT scan of the abdomen and pelvis without any acute findings.  Labs without any significant abnormalities urinalysis normal.  Follow-up with your primary care doctor.  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

## 2019-10-07 NOTE — ED Provider Notes (Signed)
[facility] DEPT Provider Note   CSN: [id number] Arrival date & time: 10/07/19  1503     History   Chief Complaint Chief Complaint  Patient presents with   Loss of Consciousness    HPI [name] is a 58 y.o. female.     Patient brought in by EMS from work.  Patient had a near syncopal episode.  Patient had some abdominal pain which she describes as cramps and nausea felt like she was going to throw up but she did not throw up.  Felt like she was going to pass out but she did not completely pass out.  Patient states that this is happened before without any specific findings.  Nausea no vomiting she was eating around 1:00 started to get abdominal crampy pain all over.  Then had near syncopal episode.  A brief period of chest pain that lasted for less than a minute.  Some fleeting shortness of breath.  Denies any fever or upper respiratory symptoms.  Denies feeling bad earlier today.  Past medical history significant for cardiac arrhythmia and diabetes mellitus without complications.  History of hypertension as well does have a history of irritable bowel syndrome.     Past Medical History:  Diagnosis Date   Allergy    Dr Caprice Red   Cardiac arrhythmia    Depression    Diabetes mellitus without complication [facility])    Fibroids    uterine sees Dr. Carlota Raspberry in [place]    Genital warts    Heart murmur    Hypertension    Irritable bowel syndrome (IBS)    Low back pain    has a herniated lumbar disc sees Dr Marlou Sa    Patient Active Problem List   Diagnosis Date Noted   Left lumbar radiculopathy 12/25/2018   Right arm pain 11/26/2018   Low back pain 11/26/2018   Left knee pain 10/01/2018   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   Type 2 diabetes mellitus without complication, without long-term current use of insulin ([place]) 02/22/2017   Loss of transverse plantar arch 10/31/2016   Pronation deformity of ankle, acquired  10/31/2016   Patellofemoral pain syndrome 09/23/2015   Diabetes mellitus, type II ([place]) 04/27/2015   OSA (obstructive sleep apnea) 03/31/2014   Asthma, chronic 08/14/2013   Leg edema 08/14/2013   Costochondritis 04/10/2013   Osteoarthritis 09/11/2010   SYNCOPE 07/12/2010   VAGINITIS, BACTERIAL 06/28/2010   GERD 03/17/2010   PALPITATIONS 03/17/2010   CHEST PAIN 03/17/2010   ACUTE BRONCHITIS 01/23/2010   CANDIDIASIS OF VULVA AND VAGINA 01/06/2010   DEPRESSION, MODERATE, RECURRENT 01/06/2010   LOW BACK PAIN 01/06/2010   SCIATICA 08/30/2009   TROCHANTERIC BURSITIS 08/09/2009   ABDOMINAL PAIN RIGHT LOWER QUADRANT 01/12/2008   MORTON'S NEUROMA 09/30/2007   ETHMOID SINUSITIS 09/30/2007   LEG PAIN, BILATERAL 09/30/2007   Abdominal pain, other specified site 09/30/2007   DEPRESSION 09/22/2007   Essential hypertension 09/22/2007    Past Surgical History:  Procedure Laterality Date   ABDOMINOPLASTY  04/2013   COLONOSCOPY  02-10-15   per Dr. Hilarie Fredrickson, clear, repeat in 10 yrs    LUMBAR LAMINECTOMY     L3-4 on 02-24-10 per Dr Louanne Skye    MYOMECTOMY     laser ablation of uterine fibroids per Dr Ouida Sills 2000   TUBAL LIGATION       OB History    Gravida  6   Para  4   Term  4   Preterm  AB  2   Living  4     SAB      TAB      Ectopic      Multiple      Live Births               Home Medications    Prior to Admission medications   Medication Sig Start Date End Date Taking? Authorizing Provider  albuterol (PROVENTIL HFA;VENTOLIN HFA) 108 (90 Base) MCG/ACT inhaler Inhale 2 puffs into the lungs every 4 (four) hours as needed for wheezing or shortness of breath. 09/10/17  Yes Laurey Morale, MD  amLODipine (NORVASC) 10 MG tablet Take 1 tablet (10 mg total) by mouth daily. 11/11/18  Yes Laurey Morale, MD  atorvastatin (LIPITOR) 20 MG tablet TAKE ONE TABLET BY MOUTH DAILY 09/08/18  Yes Laurey Morale, MD  cholecalciferol (VITAMIN D) 1000  UNITS tablet Take 1,000 Units by mouth daily.    Yes [provider]  escitalopram (LEXAPRO) 20 MG tablet Take 1 tablet (20 mg total) by mouth daily. 05/21/19  Yes Laurey Morale, MD  lisinopril (PRINIVIL,ZESTRIL) 20 MG tablet Take 1 tablet (20 mg total) by mouth 2 (two) times daily. 11/11/18  Yes Laurey Morale, MD  Magnesium 500 MG TABS Take 1 tablet by mouth daily.    Yes [provider]  metFORMIN (GLUCOPHAGE) 500 MG tablet Take 500 mg by mouth 2 (two) times daily with a meal.   Yes [provider]  Multiple Vitamins-Minerals (MULTIVITAMIN WITH MINERALS) tablet Take 1 tablet by mouth daily.   Yes [provider]  potassium chloride SA (K-DUR,KLOR-CON) 20 MEQ tablet Take 1 tablet (20 mEq total) by mouth daily. 11/11/18  Yes Laurey Morale, MD  valACYclovir (VALTREX) 500 MG tablet Take twice daily as needed for herpes virus outbreaks 06/23/18  Yes Laurey Morale, MD  glipiZIDE (GLUCOTROL) 5 MG tablet TAKE ONE TABLET BY MOUTH TWICE A DAY BEFORE A MEAL Patient not taking: No sig reported 06/23/19   Laurey Morale, MD  metFORMIN (GLUCOPHAGE) 500 MG tablet Take 1 tablet (500 mg total) by mouth 2 (two) times daily with a meal for 30 days. 03/30/19 04/29/19  Laurey Morale, MD  predniSONE (DELTASONE) 50 MG tablet  01/06/19   [provider]    Family History Family History  Problem Relation Age of Onset   Arthritis Other        Family Hx   Diabetes Other        Family Hx 1st degree relative   Hyperlipidemia Other        Family Hx   Hypertension Other        Family Hx   Prostate cancer Other        Family Hx 1st Degree relative <50   Stroke Other        First Degree Female <50   Diabetes Mother    Hypertension Mother    Diabetes Father    Hypertension Father    Diabetes Maternal Grandmother    Hypertension Maternal Grandmother    Diabetes Maternal Grandfather    Hypertension Maternal Grandfather    Diabetes Paternal Grandmother     Hypertension Paternal Grandmother    Diabetes Paternal Grandfather    Hypertension Paternal Grandfather    Colon cancer Neg Hx    Breast cancer Neg Hx     Social History Social History   Tobacco Use   Smoking status: Former Smoker  Packs/day: 0.50    Years: 1.00    Pack years: 0.50    Types: Cigarettes    Quit date: 12/17/1996    Years since quitting: 22.8   Smokeless tobacco: Never Used  Substance Use Topics   Alcohol use: No    Alcohol/week: 0.0 standard drinks   Drug use: No     Allergies   Hctz [hydrochlorothiazide], Iodinated diagnostic agents, Peanut-containing drug products, Ciprofloxacin, Shellfish-derived products, and Cephalexin   Review of Systems Review of Systems  Constitutional: Negative for chills and fever.  HENT: Negative for congestion, rhinorrhea and sore throat.   Eyes: Negative for visual disturbance.  Respiratory: Negative for cough and shortness of breath.   Cardiovascular: Positive for chest pain. Negative for leg swelling.  Gastrointestinal: Positive for abdominal pain and nausea. Negative for diarrhea and vomiting.  Genitourinary: Negative for dysuria.  Musculoskeletal: Negative for back pain and neck pain.  Skin: Negative for rash.  Neurological: Negative for dizziness, syncope, light-headedness and headaches.  Hematological: Does not bruise/bleed easily.  Psychiatric/Behavioral: Negative for confusion.     Physical Exam Updated Vital Signs BP 126/85    Pulse 85    Temp 98 F (36.7 C)    Resp 20    SpO2 98%   Physical Exam Vitals signs and nursing note reviewed.  Constitutional:      General: She is not in acute distress.    Appearance: Normal appearance. She is well-developed.  HENT:     Head: Normocephalic and atraumatic.  Eyes:     Extraocular Movements: Extraocular movements intact.     Conjunctiva/sclera: Conjunctivae normal.     Pupils: Pupils are equal, round, and reactive to light.  Neck:     Musculoskeletal:  Normal range of motion and neck supple.  Cardiovascular:     Rate and Rhythm: Normal rate and regular rhythm.     Heart sounds: No murmur.  Pulmonary:     Effort: Pulmonary effort is normal. No respiratory distress.     Breath sounds: Normal breath sounds.  Abdominal:     Palpations: Abdomen is soft.     Tenderness: There is no abdominal tenderness.  Musculoskeletal: Normal range of motion.        General: No swelling.  Skin:    General: Skin is warm and dry.  Neurological:     General: No focal deficit present.     Mental Status: She is alert and oriented to person, place, and time.      ED Treatments / Results  Labs (all labs ordered are listed, but only abnormal results are displayed) Labs Reviewed  COMPREHENSIVE METABOLIC PANEL - Abnormal; Notable for the following components:      Result Value   Glucose, Bld 166 (*)    Alkaline Phosphatase 145 (*)    All other components within normal limits  URINALYSIS, ROUTINE W REFLEX MICROSCOPIC - Abnormal; Notable for the following components:   APPearance HAZY (*)    Hgb urine dipstick SMALL (*)    Bacteria, UA MANY (*)    All other components within normal limits  CBG MONITORING, ED - Abnormal; Notable for the following components:   Glucose-Capillary 154 (*)    All other components within normal limits  CBG MONITORING, ED - Abnormal; Notable for the following components:   Glucose-Capillary 122 (*)    All other components within normal limits  LIPASE, BLOOD  CBC WITH DIFFERENTIAL/PLATELET    EKG EKG Interpretation  Date/Time:  Wednesday October 07 2019 15:24:03  EDT Ventricular Rate:  60 PR Interval:    QRS Duration: 88 QT Interval:  412 QTC Calculation: 412 R Axis:   74 Text Interpretation:  Sinus rhythm Low voltage, precordial leads Anteroseptal infarct, old No significant change since last tracing Confirmed by Fredia Sorrow [phone]) on 10/07/2019 3:51:31 PM   Radiology Ct Abdomen Pelvis Wo Contrast  Result  Date: 10/07/2019 CLINICAL DATA:  Syncopal episode nausea EXAM: CT ABDOMEN AND PELVIS WITHOUT CONTRAST TECHNIQUE: Multidetector CT imaging of the abdomen and pelvis was performed following the standard protocol without IV contrast. COMPARISON:  CT 03/05/2019 FINDINGS: Lower chest: Lung bases demonstrate no acute consolidation or effusion. The heart size is normal Hepatobiliary: Hepatic steatosis with mild sparing near the gallbladder fossa. No calcified stone or biliary dilatation Pancreas: Unremarkable. No pancreatic ductal dilatation or surrounding inflammatory changes. Spleen: Normal in size without focal abnormality. Adrenals/Urinary Tract: Adrenal glands are unremarkable. Kidneys are normal, without renal calculi, focal lesion, or hydronephrosis. Bladder is unremarkable. Stomach/Bowel: Stomach is within normal limits. Appendix appears normal. No evidence of bowel wall thickening, distention, or inflammatory changes. Vascular/Lymphatic: Mild aortic atherosclerosis. No aneurysm. No significantly enlarged lymph nodes. Reproductive: Lobulated contour with calcified mass consistent with fibroids. No adnexal mass Other: Negative for free air or free fluid Musculoskeletal: No acute or suspicious osseous abnormality. Decreased size of slightly thick-walled fluid collection over the left trochanter, now measuring 6 x 2.6 cm, previously 8.2 cm. IMPRESSION: 1. No CT evidence for acute intra-abdominal or pelvic abnormality. 2. Hepatic steatosis with fat sparing near the gallbladder fossa 3. Uterine fibroids 4. Decreased size of slightly thick-walled fluid collection over the left trochanter, possibly a chronic bursal fluid collection. Electronically Signed   By: Donavan Foil M.D.   On: 10/07/2019 20:29    Procedures Procedures (including critical care time)  Medications Ordered in ED Medications  0.9 %  sodium chloride infusion ( Intravenous New Bag/Given 10/07/19 1557)  ondansetron (ZOFRAN) injection 4 mg (0 mg  Intravenous Hold 10/07/19 1618)  sodium chloride 0.9 % bolus 500 mL (0 mLs Intravenous Stopped 10/07/19 1638)     Initial Impression / Assessment and Plan / ED Course  I have reviewed the triage vital signs and the nursing notes.  Pertinent labs & imaging results that were available during my care of the patient were reviewed by me and considered in my medical decision making (see chart for details).        Work-up for the abdominal pain cramps and near syncope and nausea without any acute findings.  Recommend patient follow back up with primary care doctor.  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  Patient overall feeling better.  Labs without any significant abnormalities urinalysis negative CT scan abdomen pelvis without any acute findings.  Patient symptoms could be related to irritable bowel.  Patient's chest pain was a minute or less.  EKG without acute findings did not last long enough to be cardiac in nature.  No persistent shortness of breath.  Oxygen saturation on room air 97%.  Not tachycardic no fevers no increased respiratory rate.  Final Clinical Impressions(s) / ED Diagnoses   Final diagnoses:  Near syncope  Nausea  Abdominal cramps    ED Discharge Orders    None       Fredia Sorrow, MD 10/07/19 2047

## 2019-10-14 ENCOUNTER — Other Ambulatory Visit: Payer: Self-pay

## 2019-10-14 DIAGNOSIS — Z20822 Contact with and (suspected) exposure to covid-19: Secondary | ICD-10-CM

## 2019-10-16 LAB — NOVEL CORONAVIRUS, NAA: SARS-CoV-2, NAA: NOT DETECTED

## 2019-10-21 ENCOUNTER — Other Ambulatory Visit: Payer: Self-pay

## 2019-10-21 ENCOUNTER — Encounter: Payer: Self-pay | Admitting: Family Medicine

## 2019-10-21 ENCOUNTER — Telehealth (INDEPENDENT_AMBULATORY_CARE_PROVIDER_SITE_OTHER): Payer: Self-pay | Admitting: Family Medicine

## 2019-10-21 DIAGNOSIS — E119 Type 2 diabetes mellitus without complications: Secondary | ICD-10-CM

## 2019-10-21 DIAGNOSIS — F418 Other specified anxiety disorders: Secondary | ICD-10-CM

## 2019-10-21 MED ORDER — VENLAFAXINE HCL ER 75 MG PO CP24: 75.0000 mg | ORAL_CAPSULE | Freq: Every day | ORAL | 3 refills | Status: DC

## 2019-10-21 MED ORDER — GLYBURIDE 5 MG PO TABS: 5.0000 mg | ORAL_TABLET | Freq: Every day | ORAL | 5 refills | Status: DC

## 2019-10-21 NOTE — Progress Notes (Signed)
Virtual Visit via Video Note  I connected with the patient on 10/21/19 at  2:15 PM EST by a video enabled telemedicine application and verified that I am speaking with the correct person using two identifiers.  Location patient: home Location provider:work or home office Persons participating in the virtual visit: patient, provider  I discussed the limitations of evaluation and management by telemedicine and the availability of in person appointments. The patient expressed understanding and agreed to proceed.   HPI: Here to discuss several issues. First she has been trying to get her diabetes under control. We found her A1c to be up to 9.1 in April, and she admitted at that time the she was not taking any of her medications. She then started back on Metformin and Glipizide. She also tightened up on her diet, and her glucoses at home began to come down. However she determined that the Glipizide was causing her to have abdominal pains and nausea, so she stopped taking that. She has remained on Metformin 500 mg BID. Lately her fasting glucoses have been running from 120 to 160. Also she describes feeling very anxious and irritable, and she has little patience with people. She has been on Lexapro 20 mg a day for several years now.    ROS: See pertinent positives and negatives per HPI.  Past Medical History:  Diagnosis Date  . Allergy    Dr Caprice Red  . Cardiac arrhythmia   . Depression   . Diabetes mellitus without complication ([place])   . Fibroids    uterine sees Dr. Carlota Raspberry in [place]   . Genital warts   . Heart murmur   . Hypertension   . Irritable bowel syndrome (IBS)   . Low back pain    has a herniated lumbar disc sees Dr Marlou Sa    Past Surgical History:  Procedure Laterality Date  . ABDOMINOPLASTY  04/2013  . COLONOSCOPY  02-10-15   per Dr. Hilarie Fredrickson, clear, repeat in 10 yrs   . LUMBAR LAMINECTOMY     L3-4 on 02-24-10 per Dr Louanne Skye   . MYOMECTOMY     laser ablation of uterine  fibroids per Dr Ouida Sills 2000  . TUBAL LIGATION      Family History  Problem Relation Age of Onset  . Arthritis Other        Family Hx  . Diabetes Other        Family Hx 1st degree relative  . Hyperlipidemia Other        Family Hx  . Hypertension Other        Family Hx  . Prostate cancer Other        Family Hx 1st Degree relative <50  . Stroke Other        First Degree Female <50  . Diabetes Mother   . Hypertension Mother   . Diabetes Father   . Hypertension Father   . Diabetes Maternal Grandmother   . Hypertension Maternal Grandmother   . Diabetes Maternal Grandfather   . Hypertension Maternal Grandfather   . Diabetes Paternal Grandmother   . Hypertension Paternal Grandmother   . Diabetes Paternal Grandfather   . Hypertension Paternal Grandfather   . Colon cancer Neg Hx   . Breast cancer Neg Hx      Current Outpatient Medications:  .  albuterol (PROVENTIL HFA;VENTOLIN HFA) 108 (90 Base) MCG/ACT inhaler, Inhale 2 puffs into the lungs every 4 (four) hours as needed for wheezing or shortness of breath., Disp: 1 Inhaler,  Rfl: 11 .  amLODipine (NORVASC) 10 MG tablet, Take 1 tablet (10 mg total) by mouth daily., Disp: 90 tablet, Rfl: 3 .  atorvastatin (LIPITOR) 20 MG tablet, TAKE ONE TABLET BY MOUTH DAILY, Disp: 90 tablet, Rfl: 10 .  cholecalciferol (VITAMIN D) 1000 UNITS tablet, Take 1,000 Units by mouth daily. , Disp: , Rfl:  .  glyBURIDE (DIABETA) 5 MG tablet, Take 1 tablet (5 mg total) by mouth daily with breakfast., Disp: 30 tablet, Rfl: 5 .  lisinopril (PRINIVIL,ZESTRIL) 20 MG tablet, Take 1 tablet (20 mg total) by mouth 2 (two) times daily., Disp: 180 tablet, Rfl: 3 .  Magnesium 500 MG TABS, Take 1 tablet by mouth daily. , Disp: , Rfl:  .  metFORMIN (GLUCOPHAGE) 500 MG tablet, Take 500 mg by mouth 2 (two) times daily with a meal., Disp: , Rfl:  .  Multiple Vitamins-Minerals (MULTIVITAMIN WITH MINERALS) tablet, Take 1 tablet by mouth daily., Disp: , Rfl:  .  potassium  chloride SA (K-DUR,KLOR-CON) 20 MEQ tablet, Take 1 tablet (20 mEq total) by mouth daily., Disp: 90 tablet, Rfl: 3 .  predniSONE (DELTASONE) 50 MG tablet, , Disp: , Rfl:  .  valACYclovir (VALTREX) 500 MG tablet, Take twice daily as needed for herpes virus outbreaks, Disp: 30 tablet, Rfl: 5 .  venlafaxine XR (EFFEXOR XR) 75 MG 24 hr capsule, Take 1 capsule (75 mg total) by mouth daily with breakfast., Disp: 30 capsule, Rfl: 3  EXAM:  VITALS per patient if applicable:  GENERAL: alert, oriented, appears well and in no acute distress  HEENT: atraumatic, conjunttiva clear, no obvious abnormalities on inspection of external nose and ears  NECK: normal movements of the head and neck  LUNGS: on inspection no signs of respiratory distress, breathing rate appears normal, no obvious gross SOB, gasping or wheezing  CV: no obvious cyanosis  MS: moves all visible extremities without noticeable abnormality  PSYCH/NEURO: pleasant and cooperative, no obvious depression or anxiety, speech and thought processing grossly intact  ASSESSMENT AND PLAN: For her diabetes, she will continue the Metformin but we will add Glyburide 5 mg every morning. She will come by the lab this week for another A1c. I reminded her to watch her diet closely. As for the anxiety, we will stop Lexapro and try Effexor XR 75 mg daily. Recheck in 3-4 weeks.  Alysia Penna, MD  Discussed the following assessment and plan:  Type 2 diabetes mellitus without complication, without long-term current use of insulin ([place]) - Plan: A1C/Hgb A1C (Glycohemoglobin)     I discussed the assessment and treatment plan with the patient. The patient was provided an opportunity to ask questions and all were answered. The patient agreed with the plan and demonstrated an understanding of the instructions.   The patient was advised to call back or seek an in-person evaluation if the symptoms worsen or if the condition fails to improve as anticipated.

## 2019-10-22 ENCOUNTER — Other Ambulatory Visit: Payer: Self-pay

## 2019-10-22 ENCOUNTER — Encounter: Payer: Self-pay | Admitting: Family Medicine

## 2019-10-22 NOTE — Addendum Note (Signed)
Addended by: Suzette Battiest on: 10/22/2019 03:46 PM   Modules accepted: Orders

## 2019-10-23 ENCOUNTER — Encounter: Payer: Self-pay | Admitting: Family Medicine

## 2019-10-23 LAB — HEMOGLOBIN A1C: Hgb A1c MFr Bld: 7.2 % — ABNORMAL HIGH (ref 4.6–6.5)

## 2019-10-26 ENCOUNTER — Telehealth: Payer: Self-pay | Admitting: Family Medicine

## 2019-10-26 NOTE — Telephone Encounter (Unsigned)
Copied from [place] [id number]. Topic: Quick Communication - See Telephone Encounter >> Oct 26, 2019  5:02 PM Blase Mess A wrote: CRM for notification. See Telephone encounter for: 10/26/19. Patient is returning Kendra's call. Please advise.

## 2019-10-27 NOTE — Telephone Encounter (Signed)
Patient is returning a call to [place].  Please call to discuss at [phone]

## 2019-10-27 NOTE — Telephone Encounter (Signed)
Yes she can keep taking 1/2 tablet BID of the new pill (Glyburide)

## 2019-10-27 NOTE — Telephone Encounter (Signed)
Pt is calling back in to speak with Tillie Rung, pt is requesting a call back.

## 2019-10-27 NOTE — Telephone Encounter (Signed)
Pt stated that she took the new medication last week that was given for Blood sugar. Pt stated the levels make her blood sugar "Bottom out" in the low 70's. Pt felt sick but could not explained the sick feeling.  Pt cut tab to 1/2 tab one in the pm at 6pm 1/2 . Levels  are normal pt wants to make sure that its okay to split the tab.

## 2019-10-28 NOTE — Telephone Encounter (Signed)
Patient notified of update  and verbalized understanding. 

## 2019-11-17 ENCOUNTER — Other Ambulatory Visit: Payer: Self-pay

## 2019-11-17 DIAGNOSIS — Z20822 Contact with and (suspected) exposure to covid-19: Secondary | ICD-10-CM

## 2019-11-19 LAB — NOVEL CORONAVIRUS, NAA: SARS-CoV-2, NAA: NOT DETECTED

## 2019-11-23 ENCOUNTER — Encounter: Payer: Self-pay | Admitting: Family Medicine

## 2019-11-23 ENCOUNTER — Telehealth: Payer: Self-pay

## 2019-11-23 ENCOUNTER — Other Ambulatory Visit: Payer: Self-pay

## 2019-11-23 ENCOUNTER — Telehealth (INDEPENDENT_AMBULATORY_CARE_PROVIDER_SITE_OTHER): Payer: Self-pay | Admitting: Family Medicine

## 2019-11-23 DIAGNOSIS — M5416 Radiculopathy, lumbar region: Secondary | ICD-10-CM

## 2019-11-23 DIAGNOSIS — K219 Gastro-esophageal reflux disease without esophagitis: Secondary | ICD-10-CM

## 2019-11-23 MED ORDER — ONDANSETRON HCL 8 MG PO TABS: 8.0000 mg | ORAL_TABLET | Freq: Three times a day (TID) | ORAL | 1 refills | Status: DC | PRN

## 2019-11-23 MED ORDER — OMEPRAZOLE 40 MG PO CPDR: 40.0000 mg | DELAYED_RELEASE_CAPSULE | Freq: Every day | ORAL | 5 refills | Status: DC

## 2019-11-23 NOTE — Telephone Encounter (Signed)
Spoke with patient to let her know I've called in her 13hr prep to her pharm in [place].  Prednisone 50mg  PO 11/26/19 @ 2100, 11/27/19 @ 0300 and 0900.  Benadryl 50mg  PO 11/27/19 @ 0900.

## 2019-11-23 NOTE — Progress Notes (Signed)
Virtual Visit via Video Note  I connected with the patient on 11/23/19 at  1:00 PM EST by a video enabled telemedicine application and verified that I am speaking with the correct person using two identifiers.  Location patient: home Location provider:work or home office Persons participating in the virtual visit: patient, provider  I discussed the limitations of evaluation and management by telemedicine and the availability of in person appointments. The patient expressed understanding and agreed to proceed.   HPI: Here for 2 weeks of intermittent nausea without vomiting, along with indigestion and some heartburn. She also notes some lower abdominal cramps. No fever. Her BMs are normal. No headache or cough or SOB or body aches. She had a negative Covid test on 11-16-19.    ROS: See pertinent positives and negatives per HPI.  Past Medical History:  Diagnosis Date  . Allergy    Dr Caprice Red  . Cardiac arrhythmia   . Depression   . Diabetes mellitus without complication ([place])   . Fibroids    uterine sees Dr. Carlota Raspberry in [place]   . Genital warts   . Heart murmur   . Hypertension   . Irritable bowel syndrome (IBS)   . Low back pain    has a herniated lumbar disc sees Dr Marlou Sa    Past Surgical History:  Procedure Laterality Date  . ABDOMINOPLASTY  04/2013  . COLONOSCOPY  02-10-15   per Dr. Hilarie Fredrickson, clear, repeat in 10 yrs   . LUMBAR LAMINECTOMY     L3-4 on 02-24-10 per Dr Louanne Skye   . MYOMECTOMY     laser ablation of uterine fibroids per Dr Ouida Sills 2000  . TUBAL LIGATION      Family History  Problem Relation Age of Onset  . Arthritis Other        Family Hx  . Diabetes Other        Family Hx 1st degree relative  . Hyperlipidemia Other        Family Hx  . Hypertension Other        Family Hx  . Prostate cancer Other        Family Hx 1st Degree relative <50  . Stroke Other        First Degree Female <50  . Diabetes Mother   . Hypertension Mother   . Diabetes Father    . Hypertension Father   . Diabetes Maternal Grandmother   . Hypertension Maternal Grandmother   . Diabetes Maternal Grandfather   . Hypertension Maternal Grandfather   . Diabetes Paternal Grandmother   . Hypertension Paternal Grandmother   . Diabetes Paternal Grandfather   . Hypertension Paternal Grandfather   . Colon cancer Neg Hx   . Breast cancer Neg Hx      Current Outpatient Medications:  .  albuterol (PROVENTIL HFA;VENTOLIN HFA) 108 (90 Base) MCG/ACT inhaler, Inhale 2 puffs into the lungs every 4 (four) hours as needed for wheezing or shortness of breath., Disp: 1 Inhaler, Rfl: 11 .  amLODipine (NORVASC) 10 MG tablet, Take 1 tablet (10 mg total) by mouth daily., Disp: 90 tablet, Rfl: 3 .  atorvastatin (LIPITOR) 20 MG tablet, TAKE ONE TABLET BY MOUTH DAILY, Disp: 90 tablet, Rfl: 10 .  cholecalciferol (VITAMIN D) 1000 UNITS tablet, Take 1,000 Units by mouth daily. , Disp: , Rfl:  .  glyBURIDE (DIABETA) 5 MG tablet, Take 1 tablet (5 mg total) by mouth daily with breakfast., Disp: 30 tablet, Rfl: 5 .  lisinopril (PRINIVIL,ZESTRIL) 20  MG tablet, Take 1 tablet (20 mg total) by mouth 2 (two) times daily., Disp: 180 tablet, Rfl: 3 .  Magnesium 500 MG TABS, Take 1 tablet by mouth daily. , Disp: , Rfl:  .  metFORMIN (GLUCOPHAGE) 500 MG tablet, Take 500 mg by mouth 2 (two) times daily with a meal., Disp: , Rfl:  .  Multiple Vitamins-Minerals (MULTIVITAMIN WITH MINERALS) tablet, Take 1 tablet by mouth daily., Disp: , Rfl:  .  potassium chloride SA (K-DUR,KLOR-CON) 20 MEQ tablet, Take 1 tablet (20 mEq total) by mouth daily., Disp: 90 tablet, Rfl: 3 .  predniSONE (DELTASONE) 50 MG tablet, , Disp: , Rfl:  .  valACYclovir (VALTREX) 500 MG tablet, Take twice daily as needed for herpes virus outbreaks, Disp: 30 tablet, Rfl: 5 .  venlafaxine XR (EFFEXOR XR) 75 MG 24 hr capsule, Take 1 capsule (75 mg total) by mouth daily with breakfast., Disp: 30 capsule, Rfl: 3  EXAM:  VITALS per patient if  applicable:  GENERAL: alert, oriented, appears well and in no acute distress  HEENT: atraumatic, conjunttiva clear, no obvious abnormalities on inspection of external nose and ears  NECK: normal movements of the head and neck  LUNGS: on inspection no signs of respiratory distress, breathing rate appears normal, no obvious gross SOB, gasping or wheezing  CV: no obvious cyanosis  MS: moves all visible extremities without noticeable abnormality  PSYCH/NEURO: pleasant and cooperative, no obvious depression or anxiety, speech and thought processing grossly intact  ASSESSMENT AND PLAN: This sounds like GERD. Try Prilosec 40 mg every morning. Use Zofran prn nausea.  Alysia Penna, MD  Discussed the following assessment and plan:  No diagnosis found.     I discussed the assessment and treatment plan with the patient. The patient was provided an opportunity to ask questions and all were answered. The patient agreed with the plan and demonstrated an understanding of the instructions.   The patient was advised to call back or seek an in-person evaluation if the symptoms worsen or if the condition fails to improve as anticipated.

## 2019-11-24 ENCOUNTER — Ambulatory Visit: Payer: Self-pay | Admitting: Family Medicine

## 2019-11-27 ENCOUNTER — Ambulatory Visit
Admission: RE | Admit: 2019-11-27 | Discharge: 2019-11-27 | Disposition: A | Discharge status: Home / Self Care | Payer: Self-pay | Source: Ambulatory Visit | Attending: Family Medicine | Admitting: Family Medicine

## 2019-11-27 ENCOUNTER — Other Ambulatory Visit: Payer: Self-pay

## 2019-11-27 DIAGNOSIS — M5416 Radiculopathy, lumbar region: Secondary | ICD-10-CM

## 2019-11-27 MED ORDER — DIAZEPAM 5 MG PO TABS: 5.0000 mg | ORAL_TABLET | Freq: Once | ORAL | Status: DC

## 2019-11-27 MED ORDER — IOPAMIDOL (ISOVUE-M 200) INJECTION 41%
1.0000 mL | Freq: Once | INTRAMUSCULAR | Status: AC
  Administered 2019-11-27: 1 mL via EPIDURAL

## 2019-11-27 MED ORDER — METHYLPREDNISOLONE ACETATE 40 MG/ML INJ SUSP (RADIOLOG
120.0000 mg | Freq: Once | INTRAMUSCULAR | Status: AC
  Administered 2019-11-27: 120 mg via EPIDURAL

## 2019-11-27 NOTE — Discharge Instructions (Signed)

## 2019-11-30 ENCOUNTER — Encounter (HOSPITAL_COMMUNITY): Payer: Self-pay

## 2019-11-30 ENCOUNTER — Other Ambulatory Visit: Payer: Self-pay

## 2019-11-30 ENCOUNTER — Emergency Department (HOSPITAL_COMMUNITY)
Admission: EM | Admit: 2019-11-30 | Discharge: 2019-12-01 | Disposition: A | Discharge status: Home / Self Care | Payer: No Typology Code available for payment source | Attending: Emergency Medicine | Admitting: Emergency Medicine

## 2019-11-30 DIAGNOSIS — Z79899 Other long term (current) drug therapy: Secondary | ICD-10-CM | POA: Insufficient documentation

## 2019-11-30 DIAGNOSIS — Z7984 Long term (current) use of oral hypoglycemic drugs: Secondary | ICD-10-CM | POA: Insufficient documentation

## 2019-11-30 DIAGNOSIS — Z91010 Allergy to peanuts: Secondary | ICD-10-CM | POA: Insufficient documentation

## 2019-11-30 DIAGNOSIS — R739 Hyperglycemia, unspecified: Secondary | ICD-10-CM

## 2019-11-30 DIAGNOSIS — R05 Cough: Secondary | ICD-10-CM | POA: Insufficient documentation

## 2019-11-30 DIAGNOSIS — R059 Cough, unspecified: Secondary | ICD-10-CM

## 2019-11-30 DIAGNOSIS — Z87891 Personal history of nicotine dependence: Secondary | ICD-10-CM | POA: Insufficient documentation

## 2019-11-30 DIAGNOSIS — E1165 Type 2 diabetes mellitus with hyperglycemia: Secondary | ICD-10-CM | POA: Insufficient documentation

## 2019-11-30 DIAGNOSIS — R079 Chest pain, unspecified: Secondary | ICD-10-CM

## 2019-11-30 DIAGNOSIS — I10 Essential (primary) hypertension: Secondary | ICD-10-CM | POA: Insufficient documentation

## 2019-11-30 DIAGNOSIS — Z20828 Contact with and (suspected) exposure to other viral communicable diseases: Secondary | ICD-10-CM | POA: Insufficient documentation

## 2019-11-30 LAB — CBG MONITORING, ED: Glucose-Capillary: 210 mg/dL — ABNORMAL HIGH (ref 70–99)

## 2019-11-30 MED ORDER — SODIUM CHLORIDE 0.9 % IV BOLUS
1000.0000 mL | Freq: Once | INTRAVENOUS | Status: AC
  Administered 2019-12-01: 1000 mL via INTRAVENOUS

## 2019-11-30 MED ORDER — ONDANSETRON HCL 4 MG/2ML IJ SOLN
4.0000 mg | Freq: Once | INTRAMUSCULAR | Status: AC
  Administered 2019-12-01: 4 mg via INTRAVENOUS
  Filled 2019-11-30: qty 2

## 2019-11-30 NOTE — ED Provider Notes (Signed)
[facility] DEPT Provider Note   CSN: [id number] Arrival date & time: 11/30/19  2247     History Chief Complaint  Patient presents with  . Hyperglycemia    [name] is a 58 y.o. female.  The history is provided by the patient and medical records.  Hyperglycemia Associated symptoms: chest pain and nausea     58 year old female with history of seasonal allergies, depression, diabetes, hypertension, presenting to the ED with multiple complaints.  1.  Hyperglycemia--states she got a steroid shot in her back on Friday and since then her sugars have been elevated into the 300s.  High as she saw today was 368 after lunch.  She was recently restarted on diabetic medications after being off of them for several months.  Her last A1c was 7 done by PCP.  CBG on arrival here is 210.  2.  URI symptoms--ongoing for about 2 weeks.  States she just feels poorly.  She reports cough without production of mucus.  She has been having some nausea but no vomiting or diarrhea.  She has been able to eat and drink well.  Husband has also been sick over the weekend with similar.  She did have a negative Covid test on 11/17/2019.  No shortness of breath.  3.  Chest pain--intermittent over the past few weeks, mid-sternal in nature.  Has talked to her primary care doctor about this and felt to be related to acid reflux.  She denies any known cardiac history.  She is a former smoker.  Past Medical History:  Diagnosis Date  . Allergy    Dr Caprice Red  . Cardiac arrhythmia   . Depression   . Diabetes mellitus without complication ([place])   . Fibroids    uterine sees Dr. Carlota Raspberry in [place]   . Genital warts   . Heart murmur   . Hypertension   . Irritable bowel syndrome (IBS)   . Low back pain    has a herniated lumbar disc sees Dr Marlou Sa    Patient Active Problem List   Diagnosis Date Noted  . Depression with anxiety 10/21/2019  . Left lumbar radiculopathy  12/25/2018  . Right arm pain 11/26/2018  . Low back pain 11/26/2018  . Left knee pain 10/01/2018  . 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 Type 2 diabetes mellitus without complication, without long-term current use of insulin ([place]) 02/22/2017  . Loss of transverse plantar arch 10/31/2016  . Pronation deformity of ankle, acquired 10/31/2016  . Patellofemoral pain syndrome 09/23/2015  . OSA (obstructive sleep apnea) 03/31/2014  . Asthma, chronic 08/14/2013  . Leg edema 08/14/2013  . Osteoarthritis 09/11/2010  . GERD 03/17/2010  . SCIATICA 08/30/2009  . TROCHANTERIC BURSITIS 08/09/2009  . LEG PAIN, BILATERAL 09/30/2007  . Essential hypertension 09/22/2007    Past Surgical History:  Procedure Laterality Date  . ABDOMINOPLASTY  04/2013  . COLONOSCOPY  02-10-15   per Dr. Hilarie Fredrickson, clear, repeat in 10 yrs   . LUMBAR LAMINECTOMY     L3-4 on 02-24-10 per Dr Louanne Skye   . MYOMECTOMY     laser ablation of uterine fibroids per Dr Ouida Sills 2000  . TUBAL LIGATION       OB History    Gravida  6   Para  4   Term  4   Preterm      AB  2   Living  4     SAB      TAB  Ectopic      Multiple      Live Births              Family History  Problem Relation Age of Onset  . Arthritis Other        Family Hx  . Diabetes Other        Family Hx 1st degree relative  . Hyperlipidemia Other        Family Hx  . Hypertension Other        Family Hx  . Prostate cancer Other        Family Hx 1st Degree relative <50  . Stroke Other        First Degree Female <50  . Diabetes Mother   . Hypertension Mother   . Diabetes Father   . Hypertension Father   . Diabetes Maternal Grandmother   . Hypertension Maternal Grandmother   . Diabetes Maternal Grandfather   . Hypertension Maternal Grandfather   . Diabetes Paternal Grandmother   . Hypertension Paternal Grandmother   . Diabetes Paternal Grandfather   . Hypertension Paternal Grandfather   . Colon cancer Neg Hx   . Breast cancer  Neg Hx     Social History   Tobacco Use  . Smoking status: Former Smoker    Packs/day: 0.50    Years: 1.00    Pack years: 0.50    Types: Cigarettes    Quit date: 12/17/1996    Years since quitting: 22.9  . Smokeless tobacco: Never Used  Substance Use Topics  . Alcohol use: No    Alcohol/week: 0.0 standard drinks  . Drug use: No    Home Medications Prior to Admission medications   Medication Sig Start Date End Date Taking? Authorizing Provider  albuterol (PROVENTIL HFA;VENTOLIN HFA) 108 (90 Base) MCG/ACT inhaler Inhale 2 puffs into the lungs every 4 (four) hours as needed for wheezing or shortness of breath. 09/10/17   Laurey Morale, MD  amLODipine (NORVASC) 10 MG tablet Take 1 tablet (10 mg total) by mouth daily. 11/11/18   Laurey Morale, MD  atorvastatin (LIPITOR) 20 MG tablet TAKE ONE TABLET BY MOUTH DAILY 09/08/18   Laurey Morale, MD  cholecalciferol (VITAMIN D) 1000 UNITS tablet Take 1,000 Units by mouth daily.     [provider]  glyBURIDE (DIABETA) 5 MG tablet Take 1 tablet (5 mg total) by mouth daily with breakfast. 10/21/19   Laurey Morale, MD  lisinopril (PRINIVIL,ZESTRIL) 20 MG tablet Take 1 tablet (20 mg total) by mouth 2 (two) times daily. 11/11/18   Laurey Morale, MD  Magnesium 500 MG TABS Take 1 tablet by mouth daily.     [provider]  metFORMIN (GLUCOPHAGE) 500 MG tablet Take 500 mg by mouth 2 (two) times daily with a meal.    [provider]  Multiple Vitamins-Minerals (MULTIVITAMIN WITH MINERALS) tablet Take 1 tablet by mouth daily.    [provider]  omeprazole (PRILOSEC) 40 MG capsule Take 1 capsule (40 mg total) by mouth daily. 11/23/19   Laurey Morale, MD  ondansetron (ZOFRAN) 8 MG tablet Take 1 tablet (8 mg total) by mouth every 8 (eight) hours as needed for nausea or vomiting. 11/23/19   Laurey Morale, MD  potassium chloride SA (K-DUR,KLOR-CON) 20 MEQ tablet Take 1 tablet (20 mEq total) by mouth daily. 11/11/18   Laurey Morale, MD  predniSONE (DELTASONE) 50 MG tablet  01/06/19   [provider]  valACYclovir (VALTREX) 500 MG tablet Take twice daily as needed for herpes virus outbreaks 06/23/18   Laurey Morale, MD  venlafaxine XR (EFFEXOR XR) 75 MG 24 hr capsule Take 1 capsule (75 mg total) by mouth daily with breakfast. 10/21/19   Laurey Morale, MD    Allergies    Iodinated diagnostic agents, Peanut-containing drug products, Hctz [hydrochlorothiazide], Shellfish-derived products, Cephalexin, and Ciprofloxacin  Review of Systems   Review of Systems  Respiratory: Positive for cough.   Cardiovascular: Positive for chest pain.  Gastrointestinal: Positive for nausea.  Endocrine:       Hyperglycemia  All other systems reviewed and are negative.   Physical Exam Updated Vital Signs BP (!) 146/79 (BP Location: Left Arm)   Pulse 61   Temp 98.2 F (36.8 C) (Oral)   Resp 15   Ht 5\' 3"  (1.6 m)   Wt 83.9 kg   SpO2 98%   BMI 32.77 kg/m   Physical Exam Vitals and nursing note reviewed.  Constitutional:      Appearance: She is well-developed.     Comments: Appears well, NAD  HENT:     Head: Normocephalic and atraumatic.  Eyes:     Conjunctiva/sclera: Conjunctivae normal.     Pupils: Pupils are equal, round, and reactive to light.  Cardiovascular:     Rate and Rhythm: Normal rate and regular rhythm.     Heart sounds: Normal heart sounds.  Pulmonary:     Effort: Pulmonary effort is normal.     Breath sounds: Normal breath sounds. No wheezing or rhonchi.  Abdominal:     General: Bowel sounds are normal. There is no distension.     Palpations: Abdomen is soft. There is no mass.  Musculoskeletal:        General: Normal range of motion.     Cervical back: Normal range of motion.  Skin:    General: Skin is warm and dry.  Neurological:     Mental Status: She is alert and oriented to person, place, and time.     ED Results / Procedures / Treatments   Labs (all labs ordered are listed,  but only abnormal results are displayed) Labs Reviewed  BASIC METABOLIC PANEL - Abnormal; Notable for the following components:      Result Value   Glucose, Bld 194 (*)    All other components within normal limits  CBC - Abnormal; Notable for the following components:   WBC 17.4 (*)    All other components within normal limits  URINALYSIS, ROUTINE W REFLEX MICROSCOPIC - Abnormal; Notable for the following components:   Glucose, UA 150 (*)    Hgb urine dipstick SMALL (*)    All other components within normal limits  CBG MONITORING, ED - Abnormal; Notable for the following components:   Glucose-Capillary 210 (*)    All other components within normal limits  SARS CORONAVIRUS 2 (TAT 6-24 HRS)  INFLUENZA PANEL BY PCR (TYPE A & B)  CBG MONITORING, ED  I-STAT BETA HCG BLOOD, ED ([facility], [facility], [facility] ONLY)  TROPONIN I (HIGH SENSITIVITY)  TROPONIN I (HIGH SENSITIVITY)    EKG EKG Interpretation  Date/Time:  Tuesday December 01 2019 00:07:05 EST Ventricular Rate:  72 PR Interval:    QRS Duration: 84 QT Interval:  403 QTC Calculation: 441 R Axis:   80 Text Interpretation: Sinus arrhythmia Borderline T wave abnormalities Minimal ST elevation, inferior leads No significant change since last tracing Confirmed by Deno Etienne [phone]) on 12/01/2019 1:52:05  AM   Radiology DG Chest 2 View  Result Date: 12/01/2019 CLINICAL DATA:  Chest pain. EXAM: CHEST - 2 VIEW COMPARISON:  Radiograph 02/02/2013 FINDINGS: The cardiomediastinal contours are normal. The lungs are clear. Pulmonary vasculature is normal. No consolidation, pleural effusion, or pneumothorax. No acute osseous abnormalities are seen. IMPRESSION: Unremarkable radiographs of the chest. Electronically Signed   By: Keith Rake M.D.   On: 12/01/2019 00:42    Procedures Procedures (including critical care time)  Medications Ordered in ED Medications  sodium chloride 0.9 % bolus 1,000 mL (1,000 mLs Intravenous New Bag/Given 12/01/19 0034)    ondansetron (ZOFRAN) injection 4 mg (4 mg Intravenous Given 12/01/19 0034)    ED Course  I have reviewed the triage vital signs and the nursing notes.  Pertinent labs & imaging results that were available during my care of the patient were reviewed by me and considered in my medical decision making (see chart for details).    MDM Rules/Calculators/A&P  58 year old female here with multiple complaints.  1.  Hyperglycemia--has sugar at home was 368.  Has been compliant with her medications.  Last A1c was 7.  Did recently get a steroid injection in her back which may have precipitated this.  Labs are grossly reassuring, does have leukocytosis, but again likely from steroids.  Glucose is 194 with normal bicarb and anion gap.  Not concerning for DKA.  Will need to monitor this closely.  2.  URI symptoms--ongoing for 2 weeks.  Had a negative Covid screen about 2 weeks ago.  Husband started displaying symptoms over the weekend.  Afebrile, nontoxic.  Lungs are clear.  Chest x-ray is clear.  Negative for flu a and B.  Repeat Covid screen is pending.  Quarantine precautions discussed.  3.  Chest pain--has been treated for acid reflux by PCP.  This is been intermittent, midsternal, and very vague in nature.  She is not had any shortness of breath, diaphoresis, or other concerning symptoms.  She has no known cardiac history.  Former smoker.  EKG without any acute changes from prior.  Troponin is negative.  Sounds atypical, low suspicion for ACS, PE, dissection, other acute cardiac event.  Patient appears stable for discharge.  Continue plan as above.  She will need close follow-up with PCP.  She may return here for any new or acute changes.  Gabrial E Staton was evaluated in Emergency Department on 12/01/2019 for the symptoms described in the history of present illness. She was evaluated in the context of the global COVID-19 pandemic, which necessitated consideration that the patient might be at risk for  infection with the SARS-CoV-2 virus that causes COVID-19. Institutional protocols and algorithms that pertain to the evaluation of patients at risk for COVID-19 are in a state of rapid change based on information released by regulatory bodies including the CDC and federal and state organizations. These policies and algorithms were followed during the patient's care in the ED.  Final Clinical Impression(s) / ED Diagnoses Final diagnoses:  Hyperglycemia  Chest pain in adult  Cough    Rx / DC Orders ED Discharge Orders    None       Larene Pickett, PA-C 12/01/19 Tatum, Nikolai, DO 12/01/19 0159

## 2019-11-30 NOTE — ED Triage Notes (Signed)
Pt BIB EMS from home. Pt reports hyperglycemia throughout the day. Pt reports cold like symptoms x2 weeks.   146/82 HR 70 CBG 282 100% RA Temp 97.9

## 2019-12-01 ENCOUNTER — Encounter: Payer: Self-pay | Admitting: Family Medicine

## 2019-12-01 ENCOUNTER — Emergency Department (HOSPITAL_COMMUNITY): Payer: No Typology Code available for payment source

## 2019-12-01 LAB — URINALYSIS, ROUTINE W REFLEX MICROSCOPIC
Bacteria, UA: NONE SEEN
Bilirubin Urine: NEGATIVE
Glucose, UA: 150 mg/dL — AB
Ketones, ur: NEGATIVE mg/dL
Leukocytes,Ua: NEGATIVE
Nitrite: NEGATIVE
Protein, ur: NEGATIVE mg/dL
Specific Gravity, Urine: 1.019 (ref 1.005–1.030)
pH: 6 (ref 5.0–8.0)

## 2019-12-01 LAB — CBC
HCT: 44.5 % (ref 36.0–46.0)
Hemoglobin: 14.7 g/dL (ref 12.0–15.0)
MCH: 29.2 pg (ref 26.0–34.0)
MCHC: 33 g/dL (ref 30.0–36.0)
MCV: 88.5 fL (ref 80.0–100.0)
Platelets: 378 10*3/uL (ref 150–400)
RBC: 5.03 MIL/uL (ref 3.87–5.11)
RDW: 14 % (ref 11.5–15.5)
WBC: 17.4 10*3/uL — ABNORMAL HIGH (ref 4.0–10.5)
nRBC: 0 % (ref 0.0–0.2)

## 2019-12-01 LAB — BASIC METABOLIC PANEL
Anion gap: 13 (ref 5–15)
BUN: 18 mg/dL (ref 6–20)
CO2: 24 mmol/L (ref 22–32)
Calcium: 9.4 mg/dL (ref 8.9–10.3)
Chloride: 102 mmol/L (ref 98–111)
Creatinine, Ser: 0.83 mg/dL (ref 0.44–1.00)
GFR calc Af Amer: >60 mL/min (ref >60)
GFR calc non Af Amer: >60 mL/min (ref >60)
Glucose, Bld: 194 mg/dL — ABNORMAL HIGH (ref 70–99)
Potassium: 3.5 mmol/L (ref 3.5–5.1)
Sodium: 139 mmol/L (ref 135–145)

## 2019-12-01 LAB — TROPONIN I (HIGH SENSITIVITY)
Troponin I (High Sensitivity): <2 ng/L (ref <18)
Troponin I (High Sensitivity): <2 ng/L (ref <18)

## 2019-12-01 LAB — I-STAT BETA HCG BLOOD, ED (MC, WL, AP ONLY): I-stat hCG, quantitative: <5 m[IU]/mL (ref <5)

## 2019-12-01 LAB — INFLUENZA PANEL BY PCR (TYPE A & B)
Influenza A By PCR: NEGATIVE
Influenza B By PCR: NEGATIVE

## 2019-12-01 LAB — SARS CORONAVIRUS 2 (TAT 6-24 HRS): SARS Coronavirus 2: NEGATIVE

## 2019-12-01 NOTE — Discharge Instructions (Signed)
Continue your home medications.  Monitor your blood sugar closely at home. COVID test should come back in the next few days.  Will also update into mychart. Follow-up with your primary care doctor. Return to the ED for new or worsening symptoms.

## 2019-12-01 NOTE — Telephone Encounter (Signed)
No OV is needed. This should resolve in the next few days

## 2019-12-03 ENCOUNTER — Other Ambulatory Visit: Payer: Self-pay | Admitting: Family Medicine

## 2019-12-21 ENCOUNTER — Other Ambulatory Visit: Payer: Self-pay | Admitting: Family Medicine

## 2019-12-21 NOTE — Telephone Encounter (Signed)
Last filled by a historical provider  Last OV 11/23/2019

## 2019-12-28 ENCOUNTER — Other Ambulatory Visit: Payer: Self-pay | Admitting: Family Medicine

## 2019-12-29 ENCOUNTER — Other Ambulatory Visit (HOSPITAL_COMMUNITY): Payer: Self-pay | Admitting: *Deleted

## 2019-12-29 DIAGNOSIS — N644 Mastodynia: Secondary | ICD-10-CM

## 2020-01-07 ENCOUNTER — Encounter (HOSPITAL_COMMUNITY): Payer: Self-pay

## 2020-01-07 ENCOUNTER — Ambulatory Visit (HOSPITAL_COMMUNITY)
Admission: RE | Admit: 2020-01-07 | Discharge: 2020-01-07 | Disposition: A | Discharge status: Home / Self Care | Payer: No Typology Code available for payment source | Source: Ambulatory Visit | Attending: Obstetrics and Gynecology | Admitting: Obstetrics and Gynecology

## 2020-01-07 ENCOUNTER — Other Ambulatory Visit: Payer: Self-pay

## 2020-01-07 DIAGNOSIS — N644 Mastodynia: Secondary | ICD-10-CM | POA: Insufficient documentation

## 2020-01-07 DIAGNOSIS — Z01419 Encounter for gynecological examination (general) (routine) without abnormal findings: Secondary | ICD-10-CM | POA: Insufficient documentation

## 2020-01-07 HISTORY — DX: Pure hypercholesterolemia, unspecified: E78.00

## 2020-01-07 NOTE — Progress Notes (Signed)
Complaints of right breast pain within the nipple area x 6 months that comes and goes. Patient rates the pain at a 3 out of 10.  Pap Smear: Pap smear completed today. Last Pap smear was 09/05/2016 at [facility] and normal. Per patient has no history of an abnormal Pap smear. Last Pap smear result is in Epic.  Physical exam: Breasts Breasts symmetrical. No skin abnormalities bilateral breasts. No nipple retraction bilateral breasts. No nipple discharge bilateral breasts. No lymphadenopathy. No lumps palpated bilateral breasts. Complaints of right nipple area breast tenderness on exam. Referred patient to the [facility] for a diagnostic mammogram. Appointment scheduled for Tuesday, January 12, 2020 at [facility].        Pelvic/Bimanual   Ext Genitalia No lesions, no swelling and no discharge observed on external genitalia.         Vagina Vagina pink and normal texture. No lesions or discharge observed in vagina.          Cervix Cervix is present. Cervix pink and of normal texture. No discharge observed.     Uterus Uterus is present and palpable. Uterus in normal position and normal size.        Adnexae Bilateral ovaries present and palpable. No tenderness on palpation.         Rectovaginal No rectal exam completed today since patient had no rectal complaints. No skin abnormalities observed on exam.    Smoking History: Patient has never smoked.  Patient Navigation: Patient education provided. Access to services provided for patient through [facility] program.  Colorectal Cancer Screening: Patient had a colonoscopy completed 02/10/2015. No complaints today.   Breast and Cervical Cancer Risk Assessment: Patient has no family history of breast cancer, known genetic mutations, or radiation treatment to the chest before age 43. Patient has no history of cervical dysplasia, immunocompromised, or DES exposure in-utero.  Risk Assessment    Risk Scores      01/07/2020   Last edited by: Demetrius Revel, LPN   5-year risk: 1.4 %   Lifetime risk: 7.6 %

## 2020-01-07 NOTE — Patient Instructions (Addendum)
Explained breast self awareness with Kristopher Glee. Let patient know [facility] will cover Pap smears and HPV typing every 5 years unless has a history of abnormal Pap smears. Referred patient to the [facility] for a diagnostic mammogram. Appointment scheduled for Tuesday, January 12, 2020 at [facility]. Patient aware of appointment and will be there. Let patient know will follow up with her within the next couple weeks with results of Pap smear by letter or phone. [name] verbalized understanding.  [name], Arvil Chaco, RN 9:05 AM

## 2020-01-12 ENCOUNTER — Ambulatory Visit: Payer: No Typology Code available for payment source

## 2020-01-12 ENCOUNTER — Ambulatory Visit
Admission: RE | Admit: 2020-01-12 | Discharge: 2020-01-12 | Disposition: A | Discharge status: Home / Self Care | Payer: No Typology Code available for payment source | Source: Ambulatory Visit | Attending: Obstetrics and Gynecology | Admitting: Obstetrics and Gynecology

## 2020-01-12 ENCOUNTER — Other Ambulatory Visit: Payer: Self-pay

## 2020-01-12 DIAGNOSIS — N644 Mastodynia: Secondary | ICD-10-CM

## 2020-01-12 LAB — CYTOLOGY - PAP
Adequacy: ABSENT
Comment: NEGATIVE
Diagnosis: NEGATIVE
High risk HPV: NEGATIVE

## 2020-01-20 ENCOUNTER — Telehealth (HOSPITAL_COMMUNITY): Payer: Self-pay | Admitting: *Deleted

## 2020-01-20 NOTE — Telephone Encounter (Signed)
Normal Pap smear and negative HPV result letter mailed to patient 01/20/2020.

## 2020-01-26 ENCOUNTER — Other Ambulatory Visit: Payer: Self-pay | Admitting: Family Medicine

## 2020-03-03 ENCOUNTER — Other Ambulatory Visit: Payer: Self-pay | Admitting: Adult Health

## 2020-03-09 NOTE — Telephone Encounter (Signed)
Patient called asking if a letter could be written to re-open her gym membership. She would like to try to return as she seems to be doing better.  This can be uploaded to [facility].

## 2020-03-31 ENCOUNTER — Other Ambulatory Visit: Payer: Self-pay | Admitting: Family Medicine

## 2020-04-11 ENCOUNTER — Telehealth: Payer: Self-pay | Admitting: Family Medicine

## 2020-04-11 MED ORDER — LISINOPRIL 20 MG PO TABS: 20.0000 mg | ORAL_TABLET | Freq: Two times a day (BID) | ORAL | 3 refills | Status: DC

## 2020-04-11 NOTE — Telephone Encounter (Signed)
Okay for refill? 10/2018  Pt has not had an in ov visit in a while. Pt has however had several VV  Please advise

## 2020-04-11 NOTE — Telephone Encounter (Signed)
I sent it in 

## 2020-04-11 NOTE — Telephone Encounter (Signed)
Pt is calling in stating that she is out of her lisinopril 20 MG and would like to know what she should do.  She is at the pharmacy and they told her that Dr. Sarajane Jews has denied it she was wanting to know why he denied it since she has been on this medication for years.  Pharm:  [facility]en[facility] at [facility].

## 2020-04-12 NOTE — Telephone Encounter (Signed)
Patient notified of update  and verbalized understanding. 

## 2020-04-13 ENCOUNTER — Ambulatory Visit: Payer: BC Managed Care – PPO | Attending: Internal Medicine

## 2020-04-13 ENCOUNTER — Encounter: Payer: Self-pay | Admitting: Family Medicine

## 2020-04-13 ENCOUNTER — Telehealth (INDEPENDENT_AMBULATORY_CARE_PROVIDER_SITE_OTHER): Payer: BC Managed Care – PPO | Admitting: Family Medicine

## 2020-04-13 DIAGNOSIS — Z20822 Contact with and (suspected) exposure to covid-19: Secondary | ICD-10-CM

## 2020-04-13 DIAGNOSIS — R6889 Other general symptoms and signs: Secondary | ICD-10-CM

## 2020-04-13 NOTE — Progress Notes (Signed)
Subjective:    Patient ID: [name], female    DOB: Oct 02, 1961, 59 y.o.   MRN: [id number]  HPI Here for the onset yesterday of body aches, chills (no fever), headache, nausea without vomiting, diarrhea, a dry cough, and slight chest pain. No SOB. 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 She is drinking fluids. She did get both Covid vaccine shots.  Virtual Visit via Telephone Note  I connected with the patient on 04/13/20 at  1:30 PM EDT by telephone and verified that I am speaking with the correct person using two identifiers.   I discussed the limitations, risks, security and privacy concerns of performing an evaluation and management service by telephone and the availability of in person appointments. I also discussed with the patient that there may be a patient responsible charge related to this service. The patient expressed understanding and agreed to proceed.  Location patient: home Location provider: work or home office Participants present for the call: patient, provider Patient did not have a visit in the prior 7 days to address this/these issue(s).   History of Present Illness:    Observations/Objective: Patient sounds cheerful and well on the phone. I do not appreciate any SOB. Speech and thought processing are grossly intact. Patient reported vitals:  Assessment and Plan: Viral illness, possibly a Covid variant. She can use Tylenol as needed. I advised her to get tested for the Covid virus today and she agreed. She will quarantine at home for the next 10 days. Recheck as needed. Gershon Crane, MD   Follow Up Instructions:     [phone] 5-10 [phone] 11-20 [phone] 21-30 I did not refer this patient for an OV in the next 24 hours for this/these issue(s).  I discussed the assessment and treatment plan with the patient. The patient was provided an opportunity to ask questions and all were  answered. The patient agreed with the plan and demonstrated an understanding of the instructions.   The patient was advised to call back or seek an in-person evaluation if the symptoms worsen or if the condition fails to improve as anticipated.  I provided 14 minutes of non-face-to-face time during this encounter.   Gershon Crane, MD   Review of Systems     Objective:   Physical Exam        Assessment & Plan:

## 2020-04-14 LAB — NOVEL CORONAVIRUS, NAA: SARS-CoV-2, NAA: NOT DETECTED

## 2020-04-14 LAB — SARS-COV-2, NAA 2 DAY TAT

## 2020-04-27 ENCOUNTER — Other Ambulatory Visit: Payer: Self-pay

## 2020-04-28 ENCOUNTER — Ambulatory Visit (INDEPENDENT_AMBULATORY_CARE_PROVIDER_SITE_OTHER): Payer: BC Managed Care – PPO | Admitting: Family Medicine

## 2020-04-28 ENCOUNTER — Encounter: Payer: Self-pay | Admitting: Family Medicine

## 2020-04-28 VITALS — BP 124/64 | HR 88 | Temp 97.7°F | Wt 189.0 lb

## 2020-04-28 DIAGNOSIS — E119 Type 2 diabetes mellitus without complications: Secondary | ICD-10-CM | POA: Diagnosis not present

## 2020-04-28 DIAGNOSIS — Z0000 Encounter for general adult medical examination without abnormal findings: Secondary | ICD-10-CM | POA: Diagnosis not present

## 2020-04-28 LAB — CBC WITH DIFFERENTIAL/PLATELET
Basophils Absolute: 0 10*3/uL (ref 0.0–0.1)
Basophils Relative: 0.4 % (ref 0.0–3.0)
Eosinophils Absolute: 0.3 10*3/uL (ref 0.0–0.7)
Eosinophils Relative: 3.3 % (ref 0.0–5.0)
HCT: 41.1 % (ref 36.0–46.0)
Hemoglobin: 13.9 g/dL (ref 12.0–15.0)
Lymphocytes Relative: 34.6 % (ref 12.0–46.0)
Lymphs Abs: 3.1 10*3/uL (ref 0.7–4.0)
MCHC: 33.7 g/dL (ref 30.0–36.0)
MCV: 88 fl (ref 78.0–100.0)
Monocytes Absolute: 0.6 10*3/uL (ref 0.1–1.0)
Monocytes Relative: 6.3 % (ref 3.0–12.0)
Neutro Abs: 5 10*3/uL (ref 1.4–7.7)
Neutrophils Relative %: 55.4 % (ref 43.0–77.0)
Platelets: 383 10*3/uL (ref 150.0–400.0)
RBC: 4.67 Mil/uL (ref 3.87–5.11)
RDW: 14.2 % (ref 11.5–15.5)
WBC: 9 10*3/uL (ref 4.0–10.5)

## 2020-04-28 LAB — HEMOGLOBIN A1C: Hgb A1c MFr Bld: 7 % — ABNORMAL HIGH (ref 4.6–6.5)

## 2020-04-28 LAB — BASIC METABOLIC PANEL
BUN: 10 mg/dL (ref 6–23)
CO2: 30 mEq/L (ref 19–32)
Calcium: 9.8 mg/dL (ref 8.4–10.5)
Chloride: 102 mEq/L (ref 96–112)
Creatinine, Ser: 0.75 mg/dL (ref 0.40–1.20)
GFR: 96.3 mL/min (ref >60.00)
Glucose, Bld: 134 mg/dL — ABNORMAL HIGH (ref 70–99)
Potassium: 4.3 mEq/L (ref 3.5–5.1)
Sodium: 141 mEq/L (ref 135–145)

## 2020-04-28 LAB — TSH: TSH: 1.36 u[IU]/mL (ref 0.35–4.50)

## 2020-04-28 LAB — HEPATIC FUNCTION PANEL
ALT: 25 U/L (ref 0–35)
AST: 15 U/L (ref 0–37)
Albumin: 4.6 g/dL (ref 3.5–5.2)
Alkaline Phosphatase: 163 U/L — ABNORMAL HIGH (ref 39–117)
Bilirubin, Direct: 0.1 mg/dL (ref 0.0–0.3)
Total Bilirubin: 0.4 mg/dL (ref 0.2–1.2)
Total Protein: 6.8 g/dL (ref 6.0–8.3)

## 2020-04-28 LAB — LIPID PANEL
Cholesterol: 162 mg/dL (ref 0–200)
HDL: 35 mg/dL — ABNORMAL LOW (ref >39.00)
NonHDL: 127.36
Total CHOL/HDL Ratio: 5
Triglycerides: 366 mg/dL — ABNORMAL HIGH (ref 0.0–149.0)
VLDL: 73.2 mg/dL — ABNORMAL HIGH (ref 0.0–40.0)

## 2020-04-28 LAB — LDL CHOLESTEROL, DIRECT: Direct LDL: 60 mg/dL

## 2020-04-28 MED ORDER — POTASSIUM CHLORIDE CRYS ER 10 MEQ PO TBCR: 20.0000 meq | EXTENDED_RELEASE_TABLET | Freq: Every day | ORAL | 3 refills | Status: DC

## 2020-04-28 MED ORDER — PREGABALIN 100 MG PO CAPS
100.0000 mg | ORAL_CAPSULE | Freq: Two times a day (BID) | ORAL | 2 refills | Status: DC
Start: 2020-04-28 — End: 2020-06-21

## 2020-04-28 MED ORDER — GLYBURIDE 5 MG PO TABS: 5.0000 mg | ORAL_TABLET | Freq: Every day | ORAL | 3 refills | Status: DC

## 2020-04-28 MED ORDER — ALBUTEROL SULFATE HFA 108 (90 BASE) MCG/ACT IN AERS: 2.0000 | INHALATION_SPRAY | RESPIRATORY_TRACT | Status: DC | PRN

## 2020-04-28 MED ORDER — VENLAFAXINE HCL ER 150 MG PO CP24: 150.0000 mg | ORAL_CAPSULE | Freq: Every day | ORAL | 3 refills | Status: DC

## 2020-04-28 MED ORDER — AMLODIPINE BESYLATE 10 MG PO TABS: 10.0000 mg | ORAL_TABLET | Freq: Every day | ORAL | 3 refills | Status: DC

## 2020-04-28 MED ORDER — ATORVASTATIN CALCIUM 20 MG PO TABS: 20.0000 mg | ORAL_TABLET | Freq: Every day | ORAL | 3 refills | Status: DC

## 2020-04-28 NOTE — Progress Notes (Signed)
Subjective:    Patient ID: [name], female    DOB: 07/02/1961, 59 y.o.   MRN: [id number]  HPI Here for a well exam. She feels well in general but has a few issues. First her anxiety and depression have been bothering her lately. She has been on Effexor XR 75 mg daily for a few years and it does not seem to work as well as it used to. Also she has toruble swallowing the 20 mEq tablet of potassium. Also the tingling and burning in her feet has been bothering her, especially at night. She tried Gabapentin in the past but it was too sedating for her.    Review of Systems  Constitutional: Negative.   HENT: Negative.   Eyes: Negative.   Respiratory: Negative.   Cardiovascular: Negative.   Gastrointestinal: Negative.   Genitourinary: Negative for decreased urine volume, difficulty urinating, dyspareunia, dysuria, enuresis, flank pain, frequency, hematuria, pelvic pain and urgency.  Musculoskeletal: Negative.   Skin: Negative.   Neurological: Positive for numbness.  Psychiatric/Behavioral: Positive for dysphoric mood. Negative for agitation, behavioral problems, confusion, decreased concentration, hallucinations and sleep disturbance. The patient is nervous/anxious.        Objective:   Physical Exam Constitutional:      General: She is not in acute distress.    Appearance: She is well-developed.  HENT:     Head: Normocephalic and atraumatic.     Right Ear: External ear normal.     Left Ear: External ear normal.     Nose: Nose normal.     Mouth/Throat:     Pharynx: No oropharyngeal exudate.  Eyes:     General: No scleral icterus.    Conjunctiva/sclera: Conjunctivae normal.     Pupils: Pupils are equal, round, and reactive to light.  Neck:     Thyroid: No thyromegaly.     Vascular: No JVD.  Cardiovascular:     Rate and Rhythm: Normal rate and regular rhythm.     Heart sounds: Normal heart sounds. No murmur. No friction rub. No gallop.   Pulmonary:     Effort: Pulmonary  effort is normal. No respiratory distress.     Breath sounds: Normal breath sounds. No wheezing or rales.  Chest:     Chest wall: No tenderness.  Abdominal:     General: Bowel sounds are normal. There is no distension.     Palpations: Abdomen is soft. There is no mass.     Tenderness: There is no abdominal tenderness. There is no guarding or rebound.  Musculoskeletal:        General: No tenderness. Normal range of motion.     Cervical back: Normal range of motion and neck supple.  Lymphadenopathy:     Cervical: No cervical adenopathy.  Skin:    General: Skin is warm and dry.     Findings: No erythema or rash.  Neurological:     Mental Status: She is alert and oriented to person, place, and time.     Cranial Nerves: No cranial nerve deficit.     Motor: No abnormal muscle tone.     Coordination: Coordination normal.     Deep Tendon Reflexes: Reflexes are normal and symmetric. Reflexes normal.  Psychiatric:        Behavior: Behavior normal.        Thought Content: Thought content normal.        Judgment: Judgment normal.           Assessment & Plan:  Well exam. We discussed diet and exercise. Get fasting labs. For the depression and anxiety we will increase the Effexor XR to 150 mg daily. We will change the potassium to taking two tablets of 10 mEq daily. For the neuropathy she will try Lyrica 100 mg bid.  Gershon Crane, MD

## 2020-04-29 ENCOUNTER — Telehealth: Payer: Self-pay | Admitting: Family Medicine

## 2020-04-29 NOTE — Telephone Encounter (Signed)
How often should the patient test?

## 2020-04-29 NOTE — Telephone Encounter (Signed)
Once a day

## 2020-04-29 NOTE — Telephone Encounter (Signed)
[name] [phone] calling stating pt drop off script for gluco monitor but not prescription or directions for test strips and lancets. Please call pharmacy to give directions on use.

## 2020-05-02 ENCOUNTER — Encounter: Payer: Self-pay | Admitting: Family Medicine

## 2020-05-02 NOTE — Telephone Encounter (Signed)
[facility] pharmacy and see what brand meter the patient was given. Their phones are down at this time. Will try back.

## 2020-05-02 NOTE — Telephone Encounter (Signed)
See my chart message

## 2020-05-04 MED ORDER — BLOOD GLUCOSE METER KIT: PACK | 0 refills | Status: AC

## 2020-06-09 ENCOUNTER — Encounter: Payer: Self-pay | Admitting: Family Medicine

## 2020-06-09 ENCOUNTER — Other Ambulatory Visit: Payer: Self-pay

## 2020-06-09 ENCOUNTER — Ambulatory Visit (INDEPENDENT_AMBULATORY_CARE_PROVIDER_SITE_OTHER): Payer: BC Managed Care – PPO | Admitting: Family Medicine

## 2020-06-09 DIAGNOSIS — M76892 Other specified enthesopathies of left lower limb, excluding foot: Secondary | ICD-10-CM

## 2020-06-09 NOTE — Patient Instructions (Signed)
Injection today Greater trochanteric bursitis Contact us on Monday if not better will get L4 Left nerve root injection See me 4 weeks after epidural

## 2020-06-09 NOTE — Progress Notes (Signed)
[name] Phone: [phone] Subjective:   [name], am serving as a scribe for Dr. Hulan Saas. This visit occurred during the SARS-CoV-2 public health emergency.  Safety protocols were in place, including screening questions prior to the visit, additional usage of staff PPE, and extensive cleaning of exam room while observing appropriate contact time as indicated for disinfecting solutions.   I'm seeing this patient by the request  of:  Laurey Morale, MD  CC:  Back pain   [id number]   12/25/2018 Patient has been doing better from the initial injury but seems to have plateaued at this moment.  Patient does have findings of a mild decrease in deep tendon reflexes in patients pain as well as being active has decreased secondary to the pain.  I believe with patient doing physical therapy and failing all other conservative therapy that advanced imaging with an MRI would be beneficial.  Discussed with patient about the possibility of epidurals and patient would likely be a candidate.  Patient will follow-up after imaging and will discuss further.  Update 06/09/2020 [name] is a 59 y.o. female coming in with complaint of low back, left hip and left leg pain. Is having pain all the way to her foot. Pain increases when lying down. Constant pain. Pain also in metatarsal heads of left foot. Last epidural 11/27/2019.   MRI 12/28/2018 IMPRESSION: 1. Broad-based left extraforaminal disc protrusion at L2-3, closely approximating and potentially affecting the exiting left L2 nerve root. 2. Left eccentric disc bulge at L3-4, closely approximating the exiting left L3 nerve root. 3. 4 mm anterolisthesis with facet hypertrophy at L4-5, resulting in mild left lateral recess stenosis and mild bilateral L4 foraminal narrowing.     Past Medical History:  Diagnosis Date  . Allergy    Dr Caprice Red  . Cardiac  arrhythmia   . Depression   . Diabetes mellitus without complication ([place])   . Fibroids    uterine sees Dr. Carlota Raspberry in [place]   . Genital warts   . Heart murmur   . High cholesterol   . Hypertension   . Irritable bowel syndrome (IBS)   . Low back pain    has a herniated lumbar disc sees Dr Marlou Sa   Past Surgical History:  Procedure Laterality Date  . ABDOMINOPLASTY  04/2013  . BREAST BIOPSY Right 06/2014  . COLONOSCOPY  02-10-15   per Dr. Hilarie Fredrickson, clear, repeat in 10 yrs   . LUMBAR LAMINECTOMY     L3-4 on 02-24-10 per Dr Louanne Skye   . MYOMECTOMY     laser ablation of uterine fibroids per Dr Ouida Sills 2000  . TUBAL LIGATION     Social History   Socioeconomic History  . Marital status: Married    Spouse name: Not on file  . Number of children: 4  . Years of education: Not on file  . Highest education level: Some college, [name]  Occupational History  . Occupation: Pharmacist, hospital  Tobacco Use  . Smoking status: Former Smoker    Packs/day: 0.50    Years: 1.00    Pack years: 0.50    Types: Cigarettes    Quit date: 12/17/1996    Years since quitting: 23.4  . Smokeless tobacco: Never Used  Vaping Use  . Vaping Use: Never used  Substance and Sexual Activity  . Alcohol use: Yes    Alcohol/week: 0.0 standard drinks    Comment:  occ  . Drug use: Allen  . Sexual activity: Yes    Birth control/protection: Surgical  Other Topics Concern  . Not on file  Social History Narrative  . Not on file   Social Determinants of Health   Financial Resource Strain:   . Difficulty of Paying Living Expenses:   Food Insecurity:   . Worried About Charity fundraiser in the Last Year:   . Arboriculturist in the Last Year:   Transportation Needs: Allen Transportation Needs  . Lack of Transportation (Medical): Allen  . Lack of Transportation (Non-Medical): Allen  Physical Activity:   . Days of Exercise per Week:   . Minutes of Exercise per Session:   Stress:   . Feeling of Stress :   Social Connections:     . Frequency of Communication with Friends and Family:   . Frequency of Social Gatherings with Friends and Family:   . Attends Religious Services:   . Active Member of Clubs or Organizations:   . Attends Archivist Meetings:   Marland Kitchen Marital Status:    Allergies  Allergen Reactions  . Iodinated Diagnostic Agents Swelling    Tongue swelling  . Peanut-Containing Drug Products Anaphylaxis  . Hctz [Hydrochlorothiazide] Other (See Comments)    Blood pressure goes up  . Shellfish-Derived Products Other (See Comments)     Per allergy test  . Cephalexin Rash  . Ciprofloxacin Diarrhea   Family History  Problem Relation Age of Onset  . Arthritis Other        Family Hx  . Diabetes Other        Family Hx 1st degree relative  . Hyperlipidemia Other        Family Hx  . Hypertension Other        Family Hx  . Prostate cancer Other        Family Hx 1st Degree relative <50  . Stroke Other        First Degree Female <50  . Diabetes Mother   . Hypertension Mother   . Diabetes Father   . Hypertension Father   . Heart disease Father   . Diabetes Maternal Grandmother   . Hypertension Maternal Grandmother   . Diabetes Maternal Grandfather   . Hypertension Maternal Grandfather   . Diabetes Paternal Grandmother   . Hypertension Paternal Grandmother   . Diabetes Paternal Grandfather   . Hypertension Paternal Grandfather   . Colon cancer Neg Hx   . Breast cancer Neg Hx     Current Outpatient Medications (Endocrine & Metabolic):  .  glyBURIDE (DIABETA) 5 MG tablet, Take 1 tablet (5 mg total) by mouth daily. .  metFORMIN (GLUCOPHAGE) 500 MG tablet, TAKE ONE TABLET BY MOUTH TWICE A DAY WITH A MEAL  Current Outpatient Medications (Cardiovascular):  .  amLODipine (NORVASC) 10 MG tablet, Take 1 tablet (10 mg total) by mouth daily. Marland Kitchen  atorvastatin (LIPITOR) 20 MG tablet, Take 1 tablet (20 mg total) by mouth daily. Marland Kitchen  lisinopril (ZESTRIL) 20 MG tablet, Take 1 tablet (20 mg total) by mouth 2  (two) times daily.  Current Outpatient Medications (Respiratory):  .  albuterol (VENTOLIN HFA) 108 (90 Base) MCG/ACT inhaler, Inhale 2 puffs into the lungs every 4 (four) hours as needed for wheezing or shortness of breath.    Current Outpatient Medications (Other):  .  blood glucose meter kit and supplies, Dispense based on patient and insurance preference. Test once a day. E11.9 .  cholecalciferol (  VITAMIN D) 1000 UNITS tablet, Take 1,000 Units by mouth daily.  .  Magnesium 500 MG TABS, Take 1 tablet by mouth daily.  .  Multiple Vitamins-Minerals (MULTIVITAMIN WITH MINERALS) tablet, Take 1 tablet by mouth daily. .  potassium chloride (KLOR-CON) 10 MEQ tablet, Take 2 tablets (20 mEq total) by mouth daily. .  pregabalin (LYRICA) 100 MG capsule, Take 1 capsule (100 mg total) by mouth 2 (two) times daily. .  valACYclovir (VALTREX) 500 MG tablet, TAKE ONE TABLET BY MOUTH TWICE A DAY AS NEEDED FOR OUTBREAKS .  venlafaxine XR (EFFEXOR XR) 150 MG 24 hr capsule, Take 1 capsule (150 mg total) by mouth daily with breakfast.   Reviewed prior external information including notes and imaging from  primary care provider As well as notes that were available from care everywhere and other healthcare systems.  Past medical history, social, surgical and family history all reviewed in electronic medical record.  Allen pertanent information unless stated regarding to the chief complaint.   Review of Systems:  Allen headache, visual changes, nausea, vomiting, diarrhea, constipation, dizziness, abdominal pain, skin rash, fevers, chills, night sweats, weight loss, swollen lymph nodes, body aches, joint swelling, chest pain, shortness of breath, mood changes. POSITIVE muscle aches  Objective  Blood pressure 116/66, pulse 75, height 5' 3"  (1.6 m), weight 186 lb (84.4 kg), last menstrual period 10/18/2015.   General: Allen apparent distress alert and oriented x3 mood and affect normal, dressed appropriately.  HEENT:  Pupils equal, extraocular movements intact  Respiratory: Patient's speak in full sentences and does not appear short of breath  Cardiovascular: Allen lower extremity edema, non tender, Allen erythema  Neuro: Cranial nerves II through XII are intact, neurovascularly intact in all extremities with 2+ DTRs and 2+ pulses.  Gait abnormal  Left hip does have severe pain over the greater trochanteric area.  Tender to palpation. Patient does have some mild pain in the back as well.  Mild pain with extension of the back.  After verbal consent patient was prepped in sterile fashion with alcohol swabs. Ethyl chloride used patient was injected with a 22-gauge 3 inch needle into the RIGHT lateral hip in the greater trochanteric area under ultrasound guidance. Picture was taken. Patient had 4 cc of 0.5% Marcaine and 1 cc of Kenalog 40 mg/dL injected. Patient tolerated the procedure well and Allen blood loss. Pain completely resolved after injection stating proper placement. Post injection instructions given.    Impression and Recommendations:     The above documentation has been reviewed and is accurate and complete Lyndal Pulley, DO       Note: This dictation was prepared with Dragon dictation along with smaller phrase technology. Any transcriptional errors that result from this process are unintentional.

## 2020-06-09 NOTE — Assessment & Plan Note (Signed)
Likely a chronic problem but repeat injection given today.  Last one was in 2012.  Discussed topical anti-inflammatories, Lyrica, Effexor.  Continue to stay active.  Discussed core strengthening.  New exercises given.  Follow-up again in 4 to 8 weeks

## 2020-06-10 ENCOUNTER — Ambulatory Visit: Payer: BC Managed Care – PPO | Admitting: Family Medicine

## 2020-06-13 ENCOUNTER — Encounter: Payer: Self-pay | Admitting: Family Medicine

## 2020-06-14 ENCOUNTER — Other Ambulatory Visit: Payer: Self-pay

## 2020-06-14 DIAGNOSIS — M5416 Radiculopathy, lumbar region: Secondary | ICD-10-CM

## 2020-06-17 ENCOUNTER — Encounter: Payer: Self-pay | Admitting: Family Medicine

## 2020-06-21 MED ORDER — PREGABALIN 50 MG PO CAPS
50.0000 mg | ORAL_CAPSULE | Freq: Two times a day (BID) | ORAL | 2 refills | Status: DC
Start: 2020-06-21 — End: 2020-12-07

## 2020-06-21 NOTE — Telephone Encounter (Signed)
I sent in 50 mg to take BID

## 2020-09-18 ENCOUNTER — Other Ambulatory Visit: Payer: Self-pay

## 2020-09-18 ENCOUNTER — Encounter: Payer: Self-pay | Admitting: Family Medicine

## 2020-09-18 ENCOUNTER — Ambulatory Visit
Admission: EM | Admit: 2020-09-18 | Discharge: 2020-09-18 | Disposition: A | Discharge status: Home / Self Care | Payer: BC Managed Care – PPO | Attending: Family Medicine | Admitting: Family Medicine

## 2020-09-18 DIAGNOSIS — Z1152 Encounter for screening for COVID-19: Secondary | ICD-10-CM

## 2020-09-18 DIAGNOSIS — J209 Acute bronchitis, unspecified: Secondary | ICD-10-CM | POA: Diagnosis not present

## 2020-09-18 LAB — POCT INFLUENZA A/B
Influenza A, POC: NEGATIVE
Influenza B, POC: NEGATIVE

## 2020-09-18 MED ORDER — AZITHROMYCIN 250 MG PO TABS
ORAL_TABLET | ORAL | 0 refills | Status: DC
Start: 2020-09-18 — End: 2020-10-25

## 2020-09-18 MED ORDER — HYDROCODONE-HOMATROPINE 5-1.5 MG/5ML PO SYRP: 5.0000 mL | ORAL_SOLUTION | Freq: Four times a day (QID) | ORAL | 0 refills | Status: DC | PRN

## 2020-09-18 NOTE — ED Triage Notes (Signed)
Pt states she has had a cough and congestion x 1 week. Fever x 3 days, and weakness generalized since Friday. Pt is aox4 qand ambulatory. 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

## 2020-09-18 NOTE — ED Provider Notes (Signed)
[facility]    CSN: [id number] Arrival date & time: 09/18/20  0846      History   Chief Complaint Chief Complaint  Patient presents with  . Sore Throat    x 1 week  . Cough    x 1 week  . Fever    yesterday 101.4    HPI [name] is a 59 y.o. female.   Initial [facility] visit  59 yo woman presents with cough, fever, chills, myalgias, sore throat and malaise typical of Covid-19 during this pandemic.  Pt states she has had a cough and congestion x 1 week. Fever x 3 days, and weakness generalized since Friday. Pt is aox4 qand ambulatory. 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  Also c/o cramps and gas.  No loss of sense of smell, but very congested.     Past Medical History:  Diagnosis Date  . Allergy    Dr Caprice Red  . Cardiac arrhythmia   . Depression   . Diabetes mellitus without complication ([place])   . Fibroids    uterine sees Dr. Carlota Raspberry in [place]   . Genital warts   . Heart murmur   . High cholesterol   . Hypertension   . Irritable bowel syndrome (IBS)   . Low back pain    has a herniated lumbar disc sees Dr Marlou Sa    Patient Active Problem List   Diagnosis Date Noted  . Well woman exam with routine gynecological exam 01/07/2020  . Breast pain, right 01/07/2020  . Depression with anxiety 10/21/2019  . Left lumbar radiculopathy 12/25/2018  . Right arm pain 11/26/2018  . Low back pain 11/26/2018  . Left knee pain 10/01/2018  . 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 Type 2 diabetes mellitus without complication, without long-term current use of insulin ([place]) 02/22/2017  . Loss of transverse plantar arch 10/31/2016  . Pronation deformity of ankle, acquired 10/31/2016  . Patellofemoral pain syndrome 09/23/2015  . OSA (obstructive sleep apnea) 03/31/2014  . Asthma, chronic 08/14/2013  . Leg edema 08/14/2013  . Osteoarthritis 09/11/2010  . GERD 03/17/2010  . SCIATICA 08/30/2009  . TROCHANTERIC BURSITIS 08/09/2009   . LEG PAIN, BILATERAL 09/30/2007  . Essential hypertension 09/22/2007    Past Surgical History:  Procedure Laterality Date  . ABDOMINOPLASTY  04/2013  . BREAST BIOPSY Right 06/2014  . COLONOSCOPY  02-10-15   per Dr. Hilarie Fredrickson, clear, repeat in 10 yrs   . LUMBAR LAMINECTOMY     L3-4 on 02-24-10 per Dr Louanne Skye   . MYOMECTOMY     laser ablation of uterine fibroids per Dr Ouida Sills 2000  . TUBAL LIGATION      OB History    Gravida  6   Para  4   Term  4   Preterm      AB  2   Living  4     SAB      TAB      Ectopic      Multiple      Live Births               Home Medications    Prior to Admission medications   Medication Sig Start Date End Date Taking? Authorizing Provider  amLODipine (NORVASC) 10 MG tablet Take 1 tablet (10 mg total) by mouth daily. 04/28/20  Yes Laurey Morale, MD  atorvastatin (LIPITOR) 20 MG tablet Take 1 tablet (20 mg total) by mouth  daily. 04/28/20  Yes Laurey Morale, MD  blood glucose meter kit and supplies Dispense based on patient and insurance preference. Test once a day. E11.9 05/04/20  Yes Laurey Morale, MD  cholecalciferol (VITAMIN D) 1000 UNITS tablet Take 1,000 Units by mouth daily.    Yes [provider]  lisinopril (ZESTRIL) 20 MG tablet Take 1 tablet (20 mg total) by mouth 2 (two) times daily. 04/11/20  Yes Laurey Morale, MD  albuterol (VENTOLIN HFA) 108 (90 Base) MCG/ACT inhaler Inhale 2 puffs into the lungs every 4 (four) hours as needed for wheezing or shortness of breath. 04/28/20   Laurey Morale, MD  azithromycin (ZITHROMAX) 250 MG tablet Take 2 tabs PO x 1 dose, then 1 tab PO QD x 4 days 09/18/20   Robyn Haber, MD  glyBURIDE (DIABETA) 5 MG tablet Take 1 tablet (5 mg total) by mouth daily. 04/28/20   Laurey Morale, MD  HYDROcodone-homatropine (HYDROMET) 5-1.5 MG/5ML syrup Take 5 mLs by mouth every 6 (six) hours as needed for cough. 09/18/20   Robyn Haber, MD  Magnesium 500 MG TABS Take 1 tablet by mouth daily.      [provider]  metFORMIN (GLUCOPHAGE) 500 MG tablet TAKE ONE TABLET BY MOUTH TWICE A DAY WITH A MEAL 12/22/19   Laurey Morale, MD  Multiple Vitamins-Minerals (MULTIVITAMIN WITH MINERALS) tablet Take 1 tablet by mouth daily.    [provider]  potassium chloride (KLOR-CON) 10 MEQ tablet Take 2 tablets (20 mEq total) by mouth daily. 04/28/20   Laurey Morale, MD  pregabalin (LYRICA) 50 MG capsule Take 1 capsule (50 mg total) by mouth 2 (two) times daily. 06/21/20   Laurey Morale, MD  valACYclovir (VALTREX) 500 MG tablet TAKE ONE TABLET BY MOUTH TWICE A DAY AS NEEDED FOR OUTBREAKS 12/28/19   Laurey Morale, MD  venlafaxine XR (EFFEXOR XR) 150 MG 24 hr capsule Take 1 capsule (150 mg total) by mouth daily with breakfast. 04/28/20   Laurey Morale, MD    Family History Family History  Problem Relation Age of Onset  . Arthritis Other        Family Hx  . Diabetes Other        Family Hx 1st degree relative  . Hyperlipidemia Other        Family Hx  . Hypertension Other        Family Hx  . Prostate cancer Other        Family Hx 1st Degree relative <50  . Stroke Other        First Degree Female <50  . Diabetes Mother   . Hypertension Mother   . Diabetes Father   . Hypertension Father   . Heart disease Father   . Diabetes Maternal Grandmother   . Hypertension Maternal Grandmother   . Diabetes Maternal Grandfather   . Hypertension Maternal Grandfather   . Diabetes Paternal Grandmother   . Hypertension Paternal Grandmother   . Diabetes Paternal Grandfather   . Hypertension Paternal Grandfather   . Colon cancer Neg Hx   . Breast cancer Neg Hx     Social History Social History   Tobacco Use  . Smoking status: Former Smoker    Packs/day: 0.50    Years: 1.00    Pack years: 0.50    Types: Cigarettes    Quit date: 12/17/1996    Years since quitting: 23.7  . Smokeless tobacco: Never Used  Vaping Use  .  Vaping Use: Never used  Substance Use Topics  . Alcohol use: Yes     Alcohol/week: 0.0 standard drinks    Comment: occ  . Drug use: No     Allergies   Iodinated diagnostic agents, Peanut-containing drug products, Hctz [hydrochlorothiazide], Shellfish-derived products, Cephalexin, and Ciprofloxacin   Review of Systems Review of Systems  Constitutional: Positive for chills and fever.  HENT: Positive for sore throat.   Respiratory: Positive for cough.   Gastrointestinal: Positive for abdominal pain and nausea.  Musculoskeletal: Positive for myalgias.  Neurological: Positive for headaches.     Physical Exam Triage Vital Signs ED Triage Vitals [09/18/20 0856]  Enc Vitals Group     BP 121/82     Pulse Rate 99     Resp 20     Temp 99.6 F (37.6 C)     Temp Source Oral     SpO2 96 %     Weight      Height      Head Circumference      Peak Flow      Pain Score      Pain Loc      Pain Edu?      Excl. in [place]?    No data found.  Updated Vital Signs BP 121/82 (BP Location: Left Arm)   Pulse 99   Temp 99.6 F (37.6 C) (Oral)   Resp 20   LMP 10/18/2015   SpO2 96%    Physical Exam Vitals and nursing note reviewed.  Constitutional:      Appearance: She is well-developed and normal weight.  HENT:     Head: Normocephalic.     Right Ear: Tympanic membrane normal.     Left Ear: Tympanic membrane normal.     Mouth/Throat:     Mouth: Mucous membranes are moist.     Pharynx: Posterior oropharyngeal erythema present.  Eyes:     Conjunctiva/sclera: Conjunctivae normal.     Pupils: Pupils are equal, round, and reactive to light.  Cardiovascular:     Rate and Rhythm: Normal rate.     Heart sounds: Normal heart sounds.  Pulmonary:     Effort: Pulmonary effort is normal.     Breath sounds: Rhonchi present.  Musculoskeletal:     Cervical back: Normal range of motion and neck supple.  Skin:    General: Skin is warm and dry.  Neurological:     General: No focal deficit present.     Mental Status: She is alert and oriented to person,  place, and time.  Psychiatric:        Mood and Affect: Mood normal.        Behavior: Behavior normal.      UC Treatments / Results  Labs (all labs ordered are listed, but only abnormal results are displayed) Labs Reviewed  NOVEL CORONAVIRUS, NAA  POCT INFLUENZA A/B  POC SARS CORONAVIRUS 2 AG -  ED    EKG   Radiology No results found.  Procedures Procedures (including critical care time)  Medications Ordered in UC Medications - No data to display  Initial Impression / Assessment and Plan / UC Course  I have reviewed the triage vital signs and the nursing notes.  Pertinent labs & imaging results that were available during my care of the patient were reviewed by me and considered in my medical decision making (see chart for details).    Final Clinical Impressions(s) / UC Diagnoses   Final diagnoses:  Encounter for  screening for COVID-19  Acute bronchitis, unspecified organism     Discharge Instructions     Your flu test is negative.  The covid test should be back by Wednesday.  Check MyChart.    ED Prescriptions    Medication Sig Dispense Auth. Provider   azithromycin (ZITHROMAX) 250 MG tablet Take 2 tabs PO x 1 dose, then 1 tab PO QD x 4 days 6 tablet Robyn Haber, MD   HYDROcodone-homatropine (HYDROMET) 5-1.5 MG/5ML syrup Take 5 mLs by mouth every 6 (six) hours as needed for cough. 60 mL Robyn Haber, MD     I have reviewed the PDMP during this encounter.   Robyn Haber, MD 09/18/20 1012

## 2020-09-18 NOTE — Discharge Instructions (Signed)
Your flu test is negative.  The covid test should be back by Wednesday.  Check MyChart.

## 2020-09-19 LAB — NOVEL CORONAVIRUS, NAA: SARS-CoV-2, NAA: DETECTED — AB

## 2020-09-19 LAB — SARS-COV-2, NAA 2 DAY TAT

## 2020-09-20 ENCOUNTER — Other Ambulatory Visit: Payer: Self-pay | Admitting: Family Medicine

## 2020-09-20 ENCOUNTER — Encounter: Payer: Self-pay | Admitting: Nurse Practitioner

## 2020-09-20 ENCOUNTER — Telehealth: Payer: BC Managed Care – PPO | Admitting: Family Medicine

## 2020-09-20 ENCOUNTER — Encounter: Payer: Self-pay | Admitting: Family Medicine

## 2020-09-20 VITALS — BP 110/85 | HR 91 | Temp 99.3°F | Wt 177.0 lb

## 2020-09-20 DIAGNOSIS — U071 COVID-19: Secondary | ICD-10-CM | POA: Diagnosis not present

## 2020-09-20 DIAGNOSIS — J4521 Mild intermittent asthma with (acute) exacerbation: Secondary | ICD-10-CM

## 2020-09-20 MED ORDER — ALBUTEROL SULFATE HFA 108 (90 BASE) MCG/ACT IN AERS: 2.0000 | INHALATION_SPRAY | RESPIRATORY_TRACT | Status: DC | PRN

## 2020-09-20 MED ORDER — ALBUTEROL SULFATE HFA 108 (90 BASE) MCG/ACT IN AERS: 2.0000 | INHALATION_SPRAY | RESPIRATORY_TRACT | 2 refills | Status: DC | PRN

## 2020-09-20 NOTE — Progress Notes (Signed)
Subjective:    Patient ID: [name], female    DOB: [birth year], 59 y.o.   MRN: [id number]  HPI Virtual Visit via Video Note  I connected with the patient on 09/20/20 at  1:30 PM EDT by a video enabled telemedicine application and verified that I am speaking with the correct person using two identifiers.  Location patient: home Location provider:work or home office Persons participating in the virtual visit: patient, provider  I discussed the limitations of evaluation and management by telemedicine and the availability of in person appointments. The patient expressed understanding and agreed to proceed.   HPI: Here to follow up on an urgent care visit on 09-18-20. She was seen that day for fevers, chills, malaise, mylagias, and a cough. Her flu test was negative. A Covid test was obtained and she was sent home with a Zpack and cough syrup. This morning the Covid returned as positive. She was contacted by someone from the [place] [facility], and she wants to ask my advice about this. Today she feels about the same. She has mold SOB but no chest pain. She is drinking fluids.    ROS: See pertinent positives and negatives per HPI.  Past Medical History:  Diagnosis Date  . Allergy    Dr Caprice Red  . Cardiac arrhythmia   . Depression   . Diabetes mellitus without complication ([place])   . Fibroids    uterine sees Dr. Carlota Raspberry in [place]   . Genital warts   . Heart murmur   . High cholesterol   . Hypertension   . Irritable bowel syndrome (IBS)   . Low back pain    has a herniated lumbar disc sees Dr Marlou Sa    Past Surgical History:  Procedure Laterality Date  . ABDOMINOPLASTY  04/2013  . BREAST BIOPSY Right 06/2014  . COLONOSCOPY  02-10-15   per Dr. Hilarie Fredrickson, clear, repeat in 10 yrs   . LUMBAR LAMINECTOMY     L3-4 on 02-24-10 per Dr Louanne Skye   . MYOMECTOMY     laser ablation of uterine fibroids per Dr Ouida Sills 2000  . TUBAL LIGATION      Family History  Problem  Relation Age of Onset  . Arthritis Other        Family Hx  . Diabetes Other        Family Hx 1st degree relative  . Hyperlipidemia Other        Family Hx  . Hypertension Other        Family Hx  . Prostate cancer Other        Family Hx 1st Degree relative <50  . Stroke Other        First Degree Female <50  . Diabetes Mother   . Hypertension Mother   . Diabetes Father   . Hypertension Father   . Heart disease Father   . Diabetes Maternal Grandmother   . Hypertension Maternal Grandmother   . Diabetes Maternal Grandfather   . Hypertension Maternal Grandfather   . Diabetes Paternal Grandmother   . Hypertension Paternal Grandmother   . Diabetes Paternal Grandfather   . Hypertension Paternal Grandfather   . Colon cancer Neg Hx   . Breast cancer Neg Hx      Current Outpatient Medications:  .  albuterol (VENTOLIN HFA) 108 (90 Base) MCG/ACT inhaler, Inhale 2 puffs into the lungs every 4 (four) hours as needed for wheezing or shortness of breath., Disp: , Rfl:  .  amLODipine (  Subjective:    Patient ID: [name], female    DOB: [birth year], 59 y.o.   MRN: [id number]  HPI Virtual Visit via Video Note  I connected with the patient on 09/20/20 at  1:30 PM EDT by a video enabled telemedicine application and verified that I am speaking with the correct person using two identifiers.  Location patient: home Location provider:work or home office Persons participating in the virtual visit: patient, provider  I discussed the limitations of evaluation and management by telemedicine and the availability of in person appointments. The patient expressed understanding and agreed to proceed.   HPI: Here to follow up on an urgent care visit on 09-18-20. She was seen that day for fevers, chills, malaise, mylagias, and a cough. Her flu test was negative. A Covid test was obtained and she was sent home with a Zpack and cough syrup. This morning the Covid returned as positive. She was contacted by someone from the [facility] [facility], and she wants to ask my advice about this. Today she feels about the same. She has mold SOB but no chest pain. She is drinking fluids.    ROS: See pertinent positives and negatives per HPI.  Past Medical History:  Diagnosis Date  . Allergy    Dr Caprice Red  . Cardiac arrhythmia   . Depression   . Diabetes mellitus without complication ([place])   . Fibroids    uterine sees Dr. Carlota Raspberry in [place]   . Genital warts   . Heart murmur   . High cholesterol   . Hypertension   . Irritable bowel syndrome (IBS)   . Low back pain    has a herniated lumbar disc sees Dr Marlou Sa    Past Surgical History:  Procedure Laterality Date  . ABDOMINOPLASTY  04/2013  . BREAST BIOPSY Right 06/2014  . COLONOSCOPY  02-10-15   per Dr. Hilarie Fredrickson, clear, repeat in 10 yrs   . LUMBAR LAMINECTOMY     L3-4 on 02-24-10 per Dr Louanne Skye   . MYOMECTOMY     laser ablation of uterine fibroids per Dr Ouida Sills 2000  . TUBAL LIGATION      Family History  Problem  Relation Age of Onset  . Arthritis Other        Family Hx  . Diabetes Other        Family Hx 1st degree relative  . Hyperlipidemia Other        Family Hx  . Hypertension Other        Family Hx  . Prostate cancer Other        Family Hx 1st Degree relative <50  . Stroke Other        First Degree Female <50  . Diabetes Mother   . Hypertension Mother   . Diabetes Father   . Hypertension Father   . Heart disease Father   . Diabetes Maternal Grandmother   . Hypertension Maternal Grandmother   . Diabetes Maternal Grandfather   . Hypertension Maternal Grandfather   . Diabetes Paternal Grandmother   . Hypertension Paternal Grandmother   . Diabetes Paternal Grandfather   . Hypertension Paternal Grandfather   . Colon cancer Neg Hx   . Breast cancer Neg Hx      Current Outpatient Medications:  .  albuterol (VENTOLIN HFA) 108 (90 Base) MCG/ACT inhaler, Inhale 2 puffs into the lungs every 4 (four) hours as needed for wheezing or shortness of breath., Disp: , Rfl:  .  amLODipine (

## 2020-09-21 ENCOUNTER — Ambulatory Visit (HOSPITAL_COMMUNITY)
Admission: RE | Admit: 2020-09-21 | Discharge: 2020-09-21 | Disposition: A | Discharge status: Home / Self Care | Payer: BC Managed Care – PPO | Source: Ambulatory Visit | Attending: Pulmonary Disease | Admitting: Pulmonary Disease

## 2020-09-21 ENCOUNTER — Other Ambulatory Visit: Payer: Self-pay | Admitting: Nurse Practitioner

## 2020-09-21 DIAGNOSIS — I10 Essential (primary) hypertension: Secondary | ICD-10-CM | POA: Diagnosis present

## 2020-09-21 DIAGNOSIS — U071 COVID-19: Secondary | ICD-10-CM

## 2020-09-21 DIAGNOSIS — J4521 Mild intermittent asthma with (acute) exacerbation: Secondary | ICD-10-CM

## 2020-09-21 DIAGNOSIS — G4733 Obstructive sleep apnea (adult) (pediatric): Secondary | ICD-10-CM

## 2020-09-21 DIAGNOSIS — E119 Type 2 diabetes mellitus without complications: Secondary | ICD-10-CM | POA: Diagnosis present

## 2020-09-21 MED ORDER — SODIUM CHLORIDE 0.9 % IV SOLN
1200.0000 mg | Freq: Once | INTRAVENOUS | Status: AC
  Administered 2020-09-21: 1200 mg via INTRAVENOUS

## 2020-09-21 MED ORDER — METHYLPREDNISOLONE SODIUM SUCC 125 MG IJ SOLR: 125.0000 mg | Freq: Once | INTRAMUSCULAR | Status: DC | PRN

## 2020-09-21 MED ORDER — SODIUM CHLORIDE 0.9 % IV SOLN: INTRAVENOUS | Status: DC | PRN

## 2020-09-21 MED ORDER — DIPHENHYDRAMINE HCL 50 MG/ML IJ SOLN: 50.0000 mg | Freq: Once | INTRAMUSCULAR | Status: DC | PRN

## 2020-09-21 MED ORDER — ALBUTEROL SULFATE HFA 108 (90 BASE) MCG/ACT IN AERS: 2.0000 | INHALATION_SPRAY | Freq: Once | RESPIRATORY_TRACT | Status: DC | PRN

## 2020-09-21 MED ORDER — EPINEPHRINE 0.3 MG/0.3ML IJ SOAJ: 0.3000 mg | Freq: Once | INTRAMUSCULAR | Status: DC | PRN

## 2020-09-21 MED ORDER — FAMOTIDINE IN NACL 20-0.9 MG/50ML-% IV SOLN: 20.0000 mg | Freq: Once | INTRAVENOUS | Status: DC | PRN

## 2020-09-21 NOTE — Discharge Instructions (Signed)

## 2020-09-21 NOTE — Progress Notes (Signed)
I connected by phone with [name] on 09/21/2020 at 7:48 AM to discuss the potential use of a new treatment for mild to moderate COVID-19 viral infection in non-hospitalized patients.  This patient is a 59 y.o. female that meets the FDA criteria for Emergency Use Authorization of COVID monoclonal antibody casirivimab/imdevimab or bamlanivimab/eteseviamb.  Has a (+) direct SARS-CoV-2 viral test result  Has mild or moderate COVID-19   Is NOT hospitalized due to COVID-19  Is within 10 days of symptom onset  Has at least one of the high risk factor(s) for progression to severe COVID-19 and/or hospitalization as defined in EUA.  Specific high risk criteria : BMI > 25, Cardiovascular disease or hypertension and Chronic Lung Disease   I have spoken and communicated the following to the patient or parent/caregiver regarding COVID monoclonal antibody treatment:  1. FDA has authorized the emergency use for the treatment of mild to moderate COVID-19 in adults and pediatric patients with positive results of direct SARS-CoV-2 viral testing who are 84 years of age and older weighing at least 40 kg, and who are at high risk for progressing to severe COVID-19 and/or hospitalization.  2. The significant known and potential risks and benefits of COVID monoclonal antibody, and the extent to which such potential risks and benefits are unknown.  3. Information on available alternative treatments and the risks and benefits of those alternatives, including clinical trials.  4. Patients treated with COVID monoclonal antibody should continue to self-isolate and use infection control measures (e.g., wear mask, isolate, social distance, avoid sharing personal items, clean and disinfect "high touch" surfaces, and frequent handwashing) according to CDC guidelines.   5. The patient or parent/caregiver has the option to accept or refuse COVID monoclonal antibody treatment.  After reviewing this information with  the patient, the patient has agreed to receive one of the available covid 19 monoclonal antibodies and will be provided an appropriate fact sheet prior to infusion. Orma Render, NP 09/21/2020 7:48 AM

## 2020-09-21 NOTE — Progress Notes (Signed)
  Diagnosis: COVID-19  Physician: Dr. Joya Gaskins  Procedure: [facility] Med: casirivimab\imdevimab infusion - Provided patient with casirivimab\imdevimab fact sheet for patients, parents and caregivers prior to infusion.  Complications: No immediate complications noted.  Discharge: Discharged home   [place] 09/21/2020

## 2020-10-25 ENCOUNTER — Other Ambulatory Visit: Payer: Self-pay

## 2020-10-25 ENCOUNTER — Ambulatory Visit (INDEPENDENT_AMBULATORY_CARE_PROVIDER_SITE_OTHER): Payer: BC Managed Care – PPO

## 2020-10-25 ENCOUNTER — Encounter: Payer: Self-pay | Admitting: Podiatry

## 2020-10-25 ENCOUNTER — Ambulatory Visit: Payer: BC Managed Care – PPO | Admitting: Podiatry

## 2020-10-25 DIAGNOSIS — E119 Type 2 diabetes mellitus without complications: Secondary | ICD-10-CM

## 2020-10-25 DIAGNOSIS — M2011 Hallux valgus (acquired), right foot: Secondary | ICD-10-CM | POA: Diagnosis not present

## 2020-10-25 DIAGNOSIS — M7742 Metatarsalgia, left foot: Secondary | ICD-10-CM

## 2020-10-25 DIAGNOSIS — M7741 Metatarsalgia, right foot: Secondary | ICD-10-CM

## 2020-10-25 DIAGNOSIS — M2041 Other hammer toe(s) (acquired), right foot: Secondary | ICD-10-CM

## 2020-10-25 DIAGNOSIS — M2142 Flat foot [pes planus] (acquired), left foot: Secondary | ICD-10-CM

## 2020-10-25 DIAGNOSIS — M2141 Flat foot [pes planus] (acquired), right foot: Secondary | ICD-10-CM | POA: Diagnosis not present

## 2020-10-25 DIAGNOSIS — M778 Other enthesopathies, not elsewhere classified: Secondary | ICD-10-CM

## 2020-10-25 NOTE — Patient Instructions (Signed)
Do exercises exactly as told by your health care provider and adjust them as directed. It is normal to feel mild stretching, pulling, tightness, or discomfort as you do these exercises. Stop the exercise right away if you feel sudden pain or your pain gets worse.   Stretching exercises These exercises improve the movement and flexibility of your calf muscles. These exercises may also help to relieve pain and stiffness. Standing gastroc stretch  This exercise is also called a standing calf (gastroc) stretch. 1. Stand with your hands against a wall. 2. Extend your left / right leg behind you, and bend your front knee slightly. Your heels should be on the floor. 3. Keeping your heels on the floor and your back knee straight, shift your weight toward the wall. You should feel a gentle stretch in the back of your lower leg (calf). 4. Hold this position for 10 seconds. Repeat 10 times. Complete this exercise 2 times a day. Gastroc and soleus stretch, standing This is an exercise in which you stand on a step and use your body weight to stretch your calf muscles. To do this exercise: 1. Stand with the ball of your left / right foot on a step. The ball of your foot is on the walking surface, right under your toes. 2. Keep your other foot firmly on the same step. 3. Hold on to the wall, a railing, or a chair for balance. 4. Slowly lift your other foot, allowing your body weight to press your left / right heel down over the edge of the step. You should feel a stretch in your left / right calf. 5. Hold this position for 10 seconds. 6. Return both feet to the step. 7. Repeat this exercise with a slight bend in your left / right knee. Repeat 10 times. Complete this exercise 2 times a day. Strengthening exercise This exercise builds strength and endurance in your foot muscles and may help to take pressure off your heel. Endurance is the ability to use your muscles for a long time, even after they get  tired. Arch lifts This exercise is sometimes called foot intrinsics. This is an exercise in which you lift the arch part of your foot only. To do this exercise: 1. Sit in a chair with your feet flat on the floor. 2. Keeping your big toe and your heel on the floor, lift only your arch, which is on the inner edge of your left / right foot. Do not move your knee or scrunch your toes. This is a small movement. 3. Hold this position for 10 seconds. 4. Return to the starting position. Repeat 10 times. Complete this exercise 2 times a day.     Look for Vionic shoes

## 2020-10-29 NOTE — Progress Notes (Signed)
Subjective:  Patient ID: [name], female    DOB: 1961/02/24,  MRN: [id number]  Chief Complaint  Patient presents with  . Foot Pain    Plantar forefoot bilateral - aching x 2 months, walking a lot makes worse, wears heeled dress shoes for work, doesn't feel swollen, notices 2nd toe right is "leaning" over towards 3rd toe, some burning, is diabetic-1c was 7.5, also takes Lyrica once or twice a week, some heel pain bilateral   . Diabetes    Last a1c was 7.5  . New Patient (Initial Visit)    59 y.o. female presents with the above complaint. History confirmed with patient.   Objective:  Physical Exam: warm, good capillary refill, no trophic changes or ulcerative lesions and normal DP and PT pulses. Bilaterally she has mild pes planus. On the right foot she has mild hallux valgus and reducible hammertoe contractures. She has tenderness on the ball of the foot bilaterally. Gastrocnemius equinus present bilaterally Assessment:   1. Hallux valgus, right   2. Hammertoe of right foot   3. Pes planus of both feet   4. Type 2 diabetes mellitus without complication, without long-term current use of insulin (HCC)   5. Metatarsalgia of both feet   6. Capsulitis of foot, unspecified laterality      Plan:  Patient was evaluated and treated and all questions answered.   Discussed the etiology and treatment options in detail with patient. I discussed with her that good supportive shoe gear would be beneficial for her. She currently usually wears flats or pointed dress shoes due to her job. I recommended that she explore Vionic shoes to see if they would be able to get her something that she can wear at work but would still be comfortable and supportive. Today I dispensed a metatarsal pad and showed her how to use this to.  Return if symptoms worsen or fail to improve.

## 2020-12-07 ENCOUNTER — Ambulatory Visit: Payer: BC Managed Care – PPO | Admitting: Family Medicine

## 2020-12-07 ENCOUNTER — Other Ambulatory Visit: Payer: Self-pay

## 2020-12-07 ENCOUNTER — Ambulatory Visit (INDEPENDENT_AMBULATORY_CARE_PROVIDER_SITE_OTHER): Payer: BC Managed Care – PPO

## 2020-12-07 ENCOUNTER — Encounter: Payer: Self-pay | Admitting: Family Medicine

## 2020-12-07 VITALS — BP 138/82 | HR 67 | Ht 63.0 in | Wt 176.0 lb

## 2020-12-07 DIAGNOSIS — M75102 Unspecified rotator cuff tear or rupture of left shoulder, not specified as traumatic: Secondary | ICD-10-CM | POA: Insufficient documentation

## 2020-12-07 DIAGNOSIS — M75101 Unspecified rotator cuff tear or rupture of right shoulder, not specified as traumatic: Secondary | ICD-10-CM | POA: Insufficient documentation

## 2020-12-07 DIAGNOSIS — M542 Cervicalgia: Secondary | ICD-10-CM

## 2020-12-07 DIAGNOSIS — M79601 Pain in right arm: Secondary | ICD-10-CM

## 2020-12-07 MED ORDER — GABAPENTIN 100 MG PO CAPS: 200.0000 mg | ORAL_CAPSULE | Freq: Every day | ORAL | 0 refills | Status: DC

## 2020-12-07 MED ORDER — MELOXICAM 7.5 MG PO TABS: 7.5000 mg | ORAL_TABLET | Freq: Every day | ORAL | 0 refills | Status: DC

## 2020-12-07 NOTE — Progress Notes (Signed)
[facility] [place] Phone: [phone] Subjective:   [name], am serving as a scribe for Dr. Hulan Saas. This visit occurred during the SARS-CoV-2 public health emergency.  Safety protocols were in place, including screening questions prior to the visit, additional usage of staff PPE, and extensive cleaning of exam room while observing appropriate contact time as indicated for disinfecting solutions. I'm seeing this patient by the request  of:  Laurey Morale, MD  CC: Shoulder pain Patient  AQT:[id number]   06/09/2020 Likely a chronic problem but repeat injection given today.  Last one was in 2012.  Discussed topical anti-inflammatories, Lyrica, Effexor.  Continue to stay active.  Discussed core strengthening.  New exercises given.  Follow-up again in 4 to 8 weeks  Update 12/07/2020 Mashelle E Rader is a 59 y.o. female coming in with complaint of right arm pain. Has been having numbness and throbbing pain in the right arm. Pain is worse at night and has been on going for past 2 weeks. Has not been using anything for pain.  Patient does take Lyrica but it does make her sleepy.  Has taken some ibuprofen off and on but nothing significant.  CT Cervical 2019  IMPRESSION: 1. Negative non contrasted CT appearance of the brain. 2. Straightening of the cervical spine with degenerative changes. 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 Nerve Root lumbar injection ordered in June 2021 but never administered.  Past Medical History:  Diagnosis Date  . Allergy    Dr Caprice Red  . Cardiac arrhythmia   . Depression   . Diabetes mellitus without complication ([place])   . Fibroids    uterine sees Dr. Carlota Raspberry in [place]   . Genital warts   . Heart murmur   . High cholesterol   . Hypertension   . Irritable bowel syndrome (IBS)   . Low back pain    has a herniated lumbar disc sees Dr Marlou Sa   Past Surgical  History:  Procedure Laterality Date  . ABDOMINOPLASTY  04/2013  . BREAST BIOPSY Right 06/2014  . COLONOSCOPY  02-10-15   per Dr. Hilarie Fredrickson, clear, repeat in 10 yrs   . LUMBAR LAMINECTOMY     L3-4 on 02-24-10 per Dr Louanne Skye   . MYOMECTOMY     laser ablation of uterine fibroids per Dr Ouida Sills 2000  . TUBAL LIGATION     Social History   Socioeconomic History  . Marital status: Married    Spouse name: Not on file  . Number of children: 4  . Years of education: Not on file  . Highest education level: Some college, no degree  Occupational History  . Occupation: Pharmacist, hospital  Tobacco Use  . Smoking status: Former Smoker    Packs/day: 0.50    Years: 1.00    Pack years: 0.50    Types: Cigarettes    Quit date: 12/17/1996    Years since quitting: 23.9  . Smokeless tobacco: Never Used  Vaping Use  . Vaping Use: Never used  Substance and Sexual Activity  . Alcohol use: Yes    Alcohol/week: 0.0 standard drinks    Comment: occ  . Drug use: No  . Sexual activity: Yes    Birth control/protection: Surgical  Other Topics Concern  . Not on file  Social History Narrative  . Not on file   Social Determinants of Health   Financial Resource Strain: Not on file  Food Insecurity:  Not on file  Transportation Needs: No Transportation Needs  . Lack of Transportation (Medical): No  . Lack of Transportation (Non-Medical): No  Physical Activity: Not on file  Stress: Not on file  Social Connections: Not on file   Allergies  Allergen Reactions  . Iodinated Diagnostic Agents Swelling    Tongue swelling  . Iodine Swelling    Tongue swelling  . Peanut-Containing Drug Products Anaphylaxis  . Hctz [Hydrochlorothiazide] Other (See Comments)    Blood pressure goes up  . Shellfish-Derived Products Other (See Comments)     Per allergy test  . Cephalexin Rash  . Ciprofloxacin Diarrhea   Family History  Problem Relation Age of Onset  . Arthritis Other        Family Hx  . Diabetes Other        Family  Hx 1st degree relative  . Hyperlipidemia Other        Family Hx  . Hypertension Other        Family Hx  . Prostate cancer Other        Family Hx 1st Degree relative <50  . Stroke Other        First Degree Female <50  . Diabetes Mother   . Hypertension Mother   . Diabetes Father   . Hypertension Father   . Heart disease Father   . Diabetes Maternal Grandmother   . Hypertension Maternal Grandmother   . Diabetes Maternal Grandfather   . Hypertension Maternal Grandfather   . Diabetes Paternal Grandmother   . Hypertension Paternal Grandmother   . Diabetes Paternal Grandfather   . Hypertension Paternal Grandfather   . Colon cancer Neg Hx   . Breast cancer Neg Hx     Current Outpatient Medications (Endocrine & Metabolic):  .  glyBURIDE (DIABETA) 5 MG tablet, Take 1 tablet (5 mg total) by mouth daily. .  metFORMIN (GLUCOPHAGE) 500 MG tablet, TAKE ONE TABLET BY MOUTH TWICE A DAY WITH A MEAL  Current Outpatient Medications (Cardiovascular):  .  amLODipine (NORVASC) 10 MG tablet, Take 1 tablet (10 mg total) by mouth daily. Marland Kitchen  atorvastatin (LIPITOR) 20 MG tablet, Take 1 tablet (20 mg total) by mouth daily. Marland Kitchen  lisinopril (ZESTRIL) 20 MG tablet, Take 1 tablet (20 mg total) by mouth 2 (two) times daily.  Current Outpatient Medications (Respiratory):  .  albuterol (VENTOLIN HFA) 108 (90 Base) MCG/ACT inhaler, Inhale 2 puffs into the lungs every 4 (four) hours as needed for wheezing or shortness of breath. Marland Kitchen  HYDROcodone-homatropine (HYDROMET) 5-1.5 MG/5ML syrup, Take 5 mLs by mouth every 6 (six) hours as needed for cough.  Current Outpatient Medications (Analgesics):  .  meloxicam (MOBIC) 7.5 MG tablet, Take 1 tablet (7.5 mg total) by mouth daily.   Current Outpatient Medications (Other):  .  blood glucose meter kit and supplies, Dispense based on patient and insurance preference. Test once a day. E11.9 .  cholecalciferol (VITAMIN D) 1000 UNITS tablet, Take 1,000 Units by mouth daily.  .   Magnesium 500 MG TABS, Take 1 tablet by mouth daily.  .  Multiple Vitamins-Minerals (MULTIVITAMIN WITH MINERALS) tablet, Take 1 tablet by mouth daily. .  potassium chloride (KLOR-CON) 10 MEQ tablet, Take 2 tablets (20 mEq total) by mouth daily. .  pregabalin (LYRICA) 50 MG capsule, Take 1 capsule (50 mg total) by mouth 2 (two) times daily. .  valACYclovir (VALTREX) 500 MG tablet, TAKE ONE TABLET BY MOUTH TWICE A DAY AS NEEDED FOR OUTBREAKS .  venlafaxine  XR (EFFEXOR XR) 150 MG 24 hr capsule, Take 1 capsule (150 mg total) by mouth daily with breakfast. .  gabapentin (NEURONTIN) 100 MG capsule, Take 2 capsules (200 mg total) by mouth at bedtime.   Reviewed prior external information including notes and imaging from  primary care provider As well as notes that were available from care everywhere and other healthcare systems.  Past medical history, social, surgical and family history all reviewed in electronic medical record.  No pertanent information unless stated regarding to the chief complaint.   Review of Systems:  No headache, visual changes, nausea, vomiting, diarrhea, constipation, dizziness, abdominal pain, skin rash, fevers, chills, night sweats, weight loss, swollen lymph nodes, body aches, joint swelling, chest pain, shortness of breath, mood changes. POSITIVE muscle aches  Objective  Blood pressure 138/82, pulse 67, height _0  (1.6 m), weight 176 lb (79.8 kg), last menstrual period 10/18/2015, SpO2 99 %.   General: No apparent distress alert and oriented x3 mood and affect normal, dressed appropriately.  HEENT: Pupils equal, extraocular movements intact  Respiratory: Patient's speak in full sentences and does not appear short of breath  Cardiovascular: No lower extremity edema, non tender, no erythema  Neuro: Cranial nerves II through XII are intact, neurovascularly intact in all extremities with 2+ DTRs and 2+ pulses.  Gait normal with good balance and coordination.  MSK: Neck  exam does have some mild loss of lordosis.  Patient though has a negative Spurling's.  Patient has difficulty moving her right arm in any type of plane.  Patient does have a mild positive empty can sign.  Positive impingement with Neer and Hawkins patient has good grip strength of the hand deep tendon reflexes do appear to be intact. Good pulses noted in the hand.  Good capillary refill.   Impression and Recommendations:     The above documentation has been reviewed and is accurate and complete Lyndal Pulley, DO

## 2020-12-07 NOTE — Patient Instructions (Signed)
Meloxicam 7.5 daily for next 10 days then as needed Do not use NSAIDS such as Advil or Aleve when taking Meloxicam It is ok to use Tylenol for additional pain relief Ice 20 min 2x a day  Gabapentin at night  Xray on way out  PT [place]  See me in 5-6 weeks

## 2020-12-07 NOTE — Assessment & Plan Note (Signed)
Patient is signs and symptoms consistent with more of a rotator cuff syndrome.  We discussed with patient in great length about different treatment options and patient has elected to start with formal physical therapy.  In addition to this we will do topical anti-inflammatories, oral anti-inflammatories meloxicam 7.5 mg given and warned patient to watch for stomach pain.  Patient will avoid all other anti-inflammatories.  Patient feels that the Lyrica makes her more tired so we will discontinue this and start gabapentin at 200 mg at night.  Patient could have some mild cervical radiculopathy.  Patient depending on how patient is responding could be a candidate for injections at follow-up in 4 weeks significant increase in weakness may need to consider advanced imaging.

## 2020-12-13 ENCOUNTER — Encounter: Payer: Self-pay | Admitting: Family Medicine

## 2020-12-13 ENCOUNTER — Ambulatory Visit: Payer: BC Managed Care – PPO | Attending: Family Medicine | Admitting: Physical Therapy

## 2020-12-13 ENCOUNTER — Encounter: Payer: Self-pay | Admitting: Physical Therapy

## 2020-12-13 ENCOUNTER — Other Ambulatory Visit: Payer: Self-pay

## 2020-12-13 ENCOUNTER — Ambulatory Visit: Payer: BC Managed Care – PPO | Admitting: Family Medicine

## 2020-12-13 VITALS — BP 138/80 | HR 68 | Temp 98.7°F | Ht 63.25 in | Wt 178.0 lb

## 2020-12-13 DIAGNOSIS — M25511 Pain in right shoulder: Secondary | ICD-10-CM | POA: Diagnosis present

## 2020-12-13 DIAGNOSIS — R1030 Lower abdominal pain, unspecified: Secondary | ICD-10-CM | POA: Diagnosis not present

## 2020-12-13 DIAGNOSIS — K5900 Constipation, unspecified: Secondary | ICD-10-CM | POA: Diagnosis not present

## 2020-12-13 DIAGNOSIS — M6281 Muscle weakness (generalized): Secondary | ICD-10-CM

## 2020-12-13 DIAGNOSIS — R293 Abnormal posture: Secondary | ICD-10-CM | POA: Insufficient documentation

## 2020-12-13 DIAGNOSIS — E119 Type 2 diabetes mellitus without complications: Secondary | ICD-10-CM | POA: Diagnosis not present

## 2020-12-13 DIAGNOSIS — M25611 Stiffness of right shoulder, not elsewhere classified: Secondary | ICD-10-CM | POA: Insufficient documentation

## 2020-12-13 LAB — HEPATIC FUNCTION PANEL
ALT: 22 U/L (ref 0–35)
AST: 16 U/L (ref 0–37)
Albumin: 4.9 g/dL (ref 3.5–5.2)
Alkaline Phosphatase: 131 U/L — ABNORMAL HIGH (ref 39–117)
Bilirubin, Direct: 0.1 mg/dL (ref 0.0–0.3)
Total Bilirubin: 0.9 mg/dL (ref 0.2–1.2)
Total Protein: 7.4 g/dL (ref 6.0–8.3)

## 2020-12-13 LAB — HEMOGLOBIN A1C: Hgb A1c MFr Bld: 6.4 % (ref 4.6–6.5)

## 2020-12-13 LAB — URINALYSIS
Bilirubin Urine: NEGATIVE
Hgb urine dipstick: NEGATIVE
Leukocytes,Ua: NEGATIVE
Nitrite: NEGATIVE
Specific Gravity, Urine: 1.025 (ref 1.000–1.030)
Total Protein, Urine: NEGATIVE
Urine Glucose: NEGATIVE
Urobilinogen, UA: 0.2 (ref 0.0–1.0)
pH: 6 (ref 5.0–8.0)

## 2020-12-13 LAB — BASIC METABOLIC PANEL
BUN: 14 mg/dL (ref 6–23)
CO2: 30 mEq/L (ref 19–32)
Calcium: 10.5 mg/dL (ref 8.4–10.5)
Chloride: 104 mEq/L (ref 96–112)
Creatinine, Ser: 0.83 mg/dL (ref 0.40–1.20)
GFR: 78.02 mL/min (ref >60.00)
Glucose, Bld: 101 mg/dL — ABNORMAL HIGH (ref 70–99)
Potassium: 4.7 mEq/L (ref 3.5–5.1)
Sodium: 141 mEq/L (ref 135–145)

## 2020-12-13 LAB — CBC WITH DIFFERENTIAL/PLATELET
Basophils Absolute: 0 10*3/uL (ref 0.0–0.1)
Basophils Relative: 0.4 % (ref 0.0–3.0)
Eosinophils Absolute: 0.3 10*3/uL (ref 0.0–0.7)
Eosinophils Relative: 2.7 % (ref 0.0–5.0)
HCT: 43.4 % (ref 36.0–46.0)
Hemoglobin: 14.7 g/dL (ref 12.0–15.0)
Lymphocytes Relative: 36.5 % (ref 12.0–46.0)
Lymphs Abs: 3.7 10*3/uL (ref 0.7–4.0)
MCHC: 33.7 g/dL (ref 30.0–36.0)
MCV: 87 fl (ref 78.0–100.0)
Monocytes Absolute: 0.6 10*3/uL (ref 0.1–1.0)
Monocytes Relative: 6.2 % (ref 3.0–12.0)
Neutro Abs: 5.5 10*3/uL (ref 1.4–7.7)
Neutrophils Relative %: 54.2 % (ref 43.0–77.0)
Platelets: 411 10*3/uL — ABNORMAL HIGH (ref 150.0–400.0)
RBC: 5 Mil/uL (ref 3.87–5.11)
RDW: 14.4 % (ref 11.5–15.5)
WBC: 10.2 10*3/uL (ref 4.0–10.5)

## 2020-12-13 MED ORDER — DOCUSATE SODIUM 100 MG PO CAPS: 100.0000 mg | ORAL_CAPSULE | Freq: Two times a day (BID) | ORAL | 5 refills | Status: DC

## 2020-12-13 NOTE — Telephone Encounter (Signed)
I sent in the prescription for Colace

## 2020-12-13 NOTE — Addendum Note (Signed)
Addended by: Lerry Liner on: 12/13/2020 11:42 AM   Modules accepted: Orders

## 2020-12-13 NOTE — Addendum Note (Signed)
Addended by: Lerry Liner on: 12/13/2020 11:43 AM   Modules accepted: Orders

## 2020-12-13 NOTE — Progress Notes (Signed)
Subjective:    Patient ID: [name], female    DOB: 05/21/1961, 59 y.o.   MRN: [id number]  HPI Here for 3 days of lower abdominal and pelvic pain. She feels much better today. She often deals with constipation, and she says her last BM was 2 weeks ago. No urinary symptoms. No fever or nausea. She drinks plenty of water. She uses Miralax but only sporadically. No back pain. Ibuprofen did not help.    Review of Systems  Constitutional: Negative.   Respiratory: Negative.   Cardiovascular: Negative.   Gastrointestinal: Positive for abdominal pain and constipation. Negative for abdominal distention, blood in stool, diarrhea, nausea, rectal pain and vomiting.  Genitourinary: Negative.   Musculoskeletal: Negative for back pain.       Objective:   Physical Exam Constitutional:      General: She is not in acute distress.    Appearance: Normal appearance.  Cardiovascular:     Rate and Rhythm: Normal rate and regular rhythm.     Pulses: Normal pulses.     Heart sounds: Normal heart sounds.  Pulmonary:     Effort: Pulmonary effort is normal.     Breath sounds: Normal breath sounds.  Abdominal:     General: Abdomen is flat. Bowel sounds are normal. There is no distension.     Palpations: Abdomen is soft. There is no mass.     Tenderness: There is no right CVA tenderness, left CVA tenderness, guarding or rebound.     Hernia: No hernia is present.     Comments: Mildly tender above the pubis   Musculoskeletal:     Comments: Lower back is not tender, ROM is full   Neurological:     Mental Status: She is alert.           Assessment & Plan:  Lower abdominal pain, likely from constipation. She will begin using Miralax every day, and we will add Colace BID. Get labs including a UA. Gershon Crane, MD

## 2020-12-13 NOTE — Telephone Encounter (Signed)
Pt is calling in stating that she would like to see if she can get the medication sent in for her bowels  Pharm:  [facility]

## 2020-12-14 NOTE — Patient Instructions (Signed)
Access Code: [email or web address] URL: [email or web address] Date: 12/14/2020 Prepared by: Dwana Curd  Exercises Flexion-Extension Shoulder Pendulum with Table Support - 1 x daily - 7 x weekly - 3 sets - 10 reps Seated Shoulder Flexion Slide at Table Top with Forearm in Neutral - 3 x daily - 7 x weekly - 1 sets - 10 reps - 5 sec hold Seated Shoulder External Rotation PROM on Table - 1 x daily - 7 x weekly - 3 sets - 10 reps

## 2020-12-14 NOTE — Therapy (Signed)
PT Duration 8 weeks    PT Treatment/Interventions Cryotherapy;ADLs/Self Care Home Management;Electrical Stimulation;Moist Heat;Traction;Therapeutic activities;Therapeutic exercise;Neuromuscular re-education;Patient/family education;Manual techniques;Taping;Dry needling;Passive range of motion    PT Next Visit Plan assess cervical ROM; review HEP; shoulder ROM pec stretch; isometric or gravity reduced strengthening as tolerated    PT Home Exercise Plan Access Code: [email or web address]    Consulted and Agree with Plan of Care Patient           Patient will benefit from skilled therapeutic intervention in order to improve the following deficits and impairments:  Pain,Postural dysfunction,Impaired UE functional use,Increased fascial restricitons,Decreased strength,Decreased range of motion,Increased muscle spasms  Visit Diagnosis: Acute pain of right shoulder  Stiffness of right shoulder, not elsewhere classified  Muscle weakness (generalized)  Abnormal posture     Problem List Patient Active Problem List   Diagnosis Date Noted  . Rotator cuff syndrome of right shoulder 12/07/2020  . COVID-19 virus infection 09/20/2020  . Well woman exam with routine gynecological exam 01/07/2020  . Breast pain, right 01/07/2020  . Depression with anxiety 10/21/2019  . Hypercholesterolemia 01/19/2019  . Left lumbar radiculopathy 12/25/2018  . Right arm pain 11/26/2018  . Low back pain 11/26/2018   . Left knee pain 10/01/2018  . 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 Type 2 diabetes mellitus without complication, without long-term current use of insulin ([facility]) 02/22/2017  . Loss of transverse plantar arch 10/31/2016  . Pronation deformity of ankle, acquired 10/31/2016  . Patellofemoral pain syndrome 09/23/2015  . OSA (obstructive sleep apnea) 03/31/2014  . Asthma, chronic 08/14/2013  . Leg edema 08/14/2013  . Osteoarthritis 09/11/2010  . GERD 03/17/2010  . SCIATICA 08/30/2009  . TROCHANTERIC BURSITIS 08/09/2009  . LEG PAIN, BILATERAL 09/30/2007  . Essential hypertension 09/22/2007    Junious Silk, PT 12/14/2020, 7:36 PM  [facility] [address] [place], [place], [address] Phone: [phone]   Fax:  [phone]  Name: LOVIE ZARLING MRN: [id number] Date of Birth: 04-04-1961  [facility] [address], [place], [place], [address] Phone: [phone]   Fax:  [phone]  Physical Therapy Evaluation  Patient Details  Name: [name] MRN: [id number] Date of Birth: Jul 05, 1961 Referring Provider (PT): Hulan Saas, DO   Encounter Date: 12/13/2020   PT End of Session - 12/14/20 1822    Visit Number 1    Date for PT Re-Evaluation 03/07/21    PT Start Time 1102    PT Stop Time 1140    PT Time Calculation (min) 38 min    Activity Tolerance Patient tolerated treatment well;Patient limited by pain    Behavior During Therapy [facility] for tasks assessed/performed           Past Medical History:  Diagnosis Date  . Allergy    Dr Caprice Red  . Cardiac arrhythmia   . Depression   . Diabetes mellitus without complication ([place])   . Fibroids    uterine sees Dr. Carlota Raspberry in [place]   . Genital warts   . Heart murmur   . High cholesterol   . Hypertension   . Irritable bowel syndrome (IBS)   . Low back pain    has a herniated lumbar disc sees Dr Marlou Sa    Past Surgical History:  Procedure Laterality Date  . ABDOMINOPLASTY  04/2013  . BREAST BIOPSY Right 06/2014  . COLONOSCOPY  02-10-15   per Dr. Hilarie Fredrickson, clear, repeat in 10 yrs   . LUMBAR LAMINECTOMY     L3-4 on 02-24-10 per Dr Louanne Skye   . MYOMECTOMY     laser ablation of uterine fibroids per Dr Ouida Sills 2000  . TUBAL LIGATION      There were no vitals filed for this visit.    Subjective Assessment - 12/14/20 1934    Subjective Just started hurting a few months ago.  I keep it at my side and have to help move it with the other arm. It hurts just sitting and standing if I am not holding it.    Pertinent History foraminal narrowing at C5-6 bilaterally and at C6-7 on the left; acromioclavicular narrowing;    Limitations Lifting;Walking;Sitting    Diagnostic tests x-ray shows foraminal narrowing at C5-6 bilaterally and at C6-7 on the left;  acromioclavicular narrowing    Patient Stated Goals get rid of pain    Currently in Pain? Yes    Pain Score 8     Pain Location Shoulder    Pain Orientation Right    Pain Descriptors / Indicators Burning    Pain Type Acute pain    Pain Radiating Towards shoulder down the arm; feels weak and dropping things    Pain Onset More than a month ago    Pain Frequency Intermittent    Aggravating Factors  trying to use the Rt arm in any capacity    Pain Relieving Factors holding it next to me helps a little    Effect of Pain on Daily Activities uanble to do any overhead activities or reaching with Rt UE    Multiple Pain Sites No              [facility] PT Assessment - 12/14/20 0001      Assessment   Medical Diagnosis Rt rotator cuff tear    Referring Provider (PT) Hulan Saas, DO    Onset Date/Surgical Date --   insidious onset 3 months ago- worsening   Hand Dominance Right    Prior Therapy No  [facility] [address], [place], [place], [address] Phone: [phone]   Fax:  [phone]  Physical Therapy Evaluation  Patient Details  Name: [name] MRN: [id number] Date of Birth: Jul 05, 1961 Referring Provider (PT): Hulan Saas, DO   Encounter Date: 12/13/2020   PT End of Session - 12/14/20 1822    Visit Number 1    Date for PT Re-Evaluation 03/07/21    PT Start Time 1102    PT Stop Time 1140    PT Time Calculation (min) 38 min    Activity Tolerance Patient tolerated treatment well;Patient limited by pain    Behavior During Therapy [facility] for tasks assessed/performed           Past Medical History:  Diagnosis Date  . Allergy    Dr Caprice Red  . Cardiac arrhythmia   . Depression   . Diabetes mellitus without complication ([place])   . Fibroids    uterine sees Dr. Carlota Raspberry in [place]   . Genital warts   . Heart murmur   . High cholesterol   . Hypertension   . Irritable bowel syndrome (IBS)   . Low back pain    has a herniated lumbar disc sees Dr Marlou Sa    Past Surgical History:  Procedure Laterality Date  . ABDOMINOPLASTY  04/2013  . BREAST BIOPSY Right 06/2014  . COLONOSCOPY  02-10-15   per Dr. Hilarie Fredrickson, clear, repeat in 10 yrs   . LUMBAR LAMINECTOMY     L3-4 on 02-24-10 per Dr Louanne Skye   . MYOMECTOMY     laser ablation of uterine fibroids per Dr Ouida Sills 2000  . TUBAL LIGATION      There were no vitals filed for this visit.    Subjective Assessment - 12/14/20 1934    Subjective Just started hurting a few months ago.  I keep it at my side and have to help move it with the other arm. It hurts just sitting and standing if I am not holding it.    Pertinent History foraminal narrowing at C5-6 bilaterally and at C6-7 on the left; acromioclavicular narrowing;    Limitations Lifting;Walking;Sitting    Diagnostic tests x-ray shows foraminal narrowing at C5-6 bilaterally and at C6-7 on the left;  acromioclavicular narrowing    Patient Stated Goals get rid of pain    Currently in Pain? Yes    Pain Score 8     Pain Location Shoulder    Pain Orientation Right    Pain Descriptors / Indicators Burning    Pain Type Acute pain    Pain Radiating Towards shoulder down the arm; feels weak and dropping things    Pain Onset More than a month ago    Pain Frequency Intermittent    Aggravating Factors  trying to use the Rt arm in any capacity    Pain Relieving Factors holding it next to me helps a little    Effect of Pain on Daily Activities uanble to do any overhead activities or reaching with Rt UE    Multiple Pain Sites No              [facility] PT Assessment - 12/14/20 0001      Assessment   Medical Diagnosis Rt rotator cuff tear    Referring Provider (PT) Hulan Saas, DO    Onset Date/Surgical Date --   insidious onset 3 months ago- worsening   Hand Dominance Right    Prior Therapy No  [facility] [address], [place], [place], [address] Phone: [phone]   Fax:  [phone]  Physical Therapy Evaluation  Patient Details  Name: [name] MRN: [id number] Date of Birth: Jul 05, 1961 Referring Provider (PT): Hulan Saas, DO   Encounter Date: 12/13/2020   PT End of Session - 12/14/20 1822    Visit Number 1    Date for PT Re-Evaluation 03/07/21    PT Start Time 1102    PT Stop Time 1140    PT Time Calculation (min) 38 min    Activity Tolerance Patient tolerated treatment well;Patient limited by pain    Behavior During Therapy [facility] for tasks assessed/performed           Past Medical History:  Diagnosis Date  . Allergy    Dr Caprice Red  . Cardiac arrhythmia   . Depression   . Diabetes mellitus without complication ([place])   . Fibroids    uterine sees Dr. Carlota Raspberry in [place]   . Genital warts   . Heart murmur   . High cholesterol   . Hypertension   . Irritable bowel syndrome (IBS)   . Low back pain    has a herniated lumbar disc sees Dr Marlou Sa    Past Surgical History:  Procedure Laterality Date  . ABDOMINOPLASTY  04/2013  . BREAST BIOPSY Right 06/2014  . COLONOSCOPY  02-10-15   per Dr. Hilarie Fredrickson, clear, repeat in 10 yrs   . LUMBAR LAMINECTOMY     L3-4 on 02-24-10 per Dr Louanne Skye   . MYOMECTOMY     laser ablation of uterine fibroids per Dr Ouida Sills 2000  . TUBAL LIGATION      There were no vitals filed for this visit.    Subjective Assessment - 12/14/20 1934    Subjective Just started hurting a few months ago.  I keep it at my side and have to help move it with the other arm. It hurts just sitting and standing if I am not holding it.    Pertinent History foraminal narrowing at C5-6 bilaterally and at C6-7 on the left; acromioclavicular narrowing;    Limitations Lifting;Walking;Sitting    Diagnostic tests x-ray shows foraminal narrowing at C5-6 bilaterally and at C6-7 on the left;  acromioclavicular narrowing    Patient Stated Goals get rid of pain    Currently in Pain? Yes    Pain Score 8     Pain Location Shoulder    Pain Orientation Right    Pain Descriptors / Indicators Burning    Pain Type Acute pain    Pain Radiating Towards shoulder down the arm; feels weak and dropping things    Pain Onset More than a month ago    Pain Frequency Intermittent    Aggravating Factors  trying to use the Rt arm in any capacity    Pain Relieving Factors holding it next to me helps a little    Effect of Pain on Daily Activities uanble to do any overhead activities or reaching with Rt UE    Multiple Pain Sites No              [facility] PT Assessment - 12/14/20 0001      Assessment   Medical Diagnosis Rt rotator cuff tear    Referring Provider (PT) Hulan Saas, DO    Onset Date/Surgical Date --   insidious onset 3 months ago- worsening   Hand Dominance Right    Prior Therapy No

## 2020-12-20 ENCOUNTER — Telehealth: Payer: Self-pay | Admitting: Family Medicine

## 2020-12-20 NOTE — Telephone Encounter (Signed)
Patient dropped off Handicapped Drivers Registration Plate  Call for pick up at: [phone]  Disposition: Dr's folder

## 2020-12-26 ENCOUNTER — Ambulatory Visit: Payer: BC Managed Care – PPO | Admitting: Family Medicine

## 2020-12-27 NOTE — Telephone Encounter (Signed)
Patient called and advised the form will be available for pickup at the front office. She verbalized understanding.

## 2021-01-03 ENCOUNTER — Telehealth: Payer: Self-pay | Admitting: Family Medicine

## 2021-01-03 NOTE — Telephone Encounter (Signed)
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  Mail to: [address] [place], [place] [address].

## 2021-01-04 NOTE — Telephone Encounter (Signed)
Pt notified that form is complete.  She will pick up at the front desk.  A copy has been sent to scan.  Nothing further needed.

## 2021-01-04 NOTE — Telephone Encounter (Signed)
The form is ready  

## 2021-01-05 ENCOUNTER — Other Ambulatory Visit: Payer: Self-pay

## 2021-01-05 ENCOUNTER — Ambulatory Visit: Payer: BC Managed Care – PPO | Admitting: Physical Therapy

## 2021-01-05 ENCOUNTER — Ambulatory Visit: Payer: BC Managed Care – PPO | Attending: Family Medicine | Admitting: Physical Therapy

## 2021-01-05 DIAGNOSIS — M25511 Pain in right shoulder: Secondary | ICD-10-CM | POA: Diagnosis present

## 2021-01-05 DIAGNOSIS — R293 Abnormal posture: Secondary | ICD-10-CM | POA: Insufficient documentation

## 2021-01-05 DIAGNOSIS — M25611 Stiffness of right shoulder, not elsewhere classified: Secondary | ICD-10-CM | POA: Diagnosis present

## 2021-01-05 DIAGNOSIS — M6281 Muscle weakness (generalized): Secondary | ICD-10-CM | POA: Insufficient documentation

## 2021-01-05 NOTE — Therapy (Signed)
re-education;Patient/family education;Manual techniques;Taping;Dry needling;Passive range of motion;Iontophoresis 4mg /ml Dexamethasone    PT Next Visit Plan recertification sent to  add ionto to treatment plan;  assess response to KT; try UE Ranger    PT Home Exercise Plan Access Code: [email or web address]           Patient will benefit from skilled therapeutic intervention in order to improve the following deficits and impairments:  Pain,Postural dysfunction,Impaired UE functional use,Increased fascial restricitons,Decreased strength,Decreased range of motion,Increased muscle spasms  Visit Diagnosis: Acute pain of right shoulder - Plan: PT plan of care cert/re-cert  Stiffness of right shoulder, not elsewhere classified - Plan: PT plan of care cert/re-cert  Muscle weakness (generalized) - Plan: PT plan of care cert/re-cert     Problem List Patient Active Problem List   Diagnosis Date Noted  . Rotator cuff syndrome of right shoulder 12/07/2020  . COVID-19 virus infection 09/20/2020  . Well woman exam with routine gynecological exam 01/07/2020  . Breast pain, right 01/07/2020  . Depression with anxiety 10/21/2019  . Hypercholesterolemia 01/19/2019  . Left lumbar radiculopathy 12/25/2018  . Right arm pain 11/26/2018  . Low back pain 11/26/2018  . Left knee pain 10/01/2018  . 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 Type 2 diabetes mellitus without complication, without long-term current use of insulin ([place]) 02/22/2017  . Loss of transverse plantar arch 10/31/2016  . Pronation deformity of ankle, acquired 10/31/2016  . Patellofemoral pain syndrome 09/23/2015  . OSA (obstructive sleep apnea) 03/31/2014  . Asthma, chronic 08/14/2013  . Leg edema 08/14/2013  . Osteoarthritis 09/11/2010  . GERD 03/17/2010  . SCIATICA 08/30/2009  . TROCHANTERIC BURSITIS 08/09/2009  . LEG PAIN, BILATERAL 09/30/2007  . Essential hypertension 09/22/2007   Ruben Im, PT 01/05/21 6:28 PM Phone: [phone] Fax: [phone] Alvera Singh 01/05/2021, 6:28 PM  [facility] [address] [place], [place],  [address] Phone: [phone]   Fax:  [phone]  Name: KASHA HOWETH MRN: [id number] Date of Birth: [birth year]  [facility] [address], [place], [place], [address] Phone: [phone]   Fax:  [phone]  Physical Therapy Treatment  Patient Details  Name: [name] MRN: [id number] Date of Birth: 1961-03-16 Referring Provider (PT): Hulan Saas, DO   Encounter Date: 01/05/2021   PT End of Session - 01/05/21 1814    Visit Number 2    Date for PT Re-Evaluation 03/07/21    PT Start Time 0923    PT Stop Time 1010    PT Time Calculation (min) 47 min    Activity Tolerance Patient limited by pain           Past Medical History:  Diagnosis Date  . Allergy    Dr Caprice Red  . Cardiac arrhythmia   . Depression   . Diabetes mellitus without complication ([place])   . Fibroids    uterine sees Dr. Carlota Raspberry in [place]   . Genital warts   . Heart murmur   . High cholesterol   . Hypertension   . Irritable bowel syndrome (IBS)   . Low back pain    has a herniated lumbar disc sees Dr Marlou Sa    Past Surgical History:  Procedure Laterality Date  . ABDOMINOPLASTY  04/2013  . BREAST BIOPSY Right 06/2014  . COLONOSCOPY  02-10-15   per Dr. Hilarie Fredrickson, clear, repeat in 10 yrs   . LUMBAR LAMINECTOMY     L3-4 on 02-24-10 per Dr Louanne Skye   . MYOMECTOMY     laser ablation of uterine fibroids per Dr Ouida Sills 2000  . TUBAL LIGATION      There were no vitals filed for this visit.   Subjective Assessment - 01/05/21 0924    Subjective The pain gets back it woke me up. I took pain medicine this morning.  No injections b/c the doctor is worried my blood sugar will go up.    Pertinent History foraminal narrowing at C5-6 bilaterally and at C6-7 on the left; acromioclavicular narrowing;    Diagnostic tests x-ray shows foraminal narrowing at C5-6 bilaterally and at C6-7 on the left; acromioclavicular narrowing    Currently in Pain? Yes    Pain Score 2    took pain meds   Pain Location Shoulder    Pain Orientation Right    Pain Descriptors /  Indicators Throbbing    Pain Type Acute pain    Pain Onset More than a month ago    Aggravating Factors  any right arm use    Pain Relieving Factors ice; pain medication                             [facility] Adult PT Treatment/Exercise - 01/05/21 0001      Self-Care   Other Self-Care Comments  discussed positioning in slight abduction in sitting and supine for improved circulation; discussion of elbow flex and extension ex's at gym and arm bike; discussed risk factors of developing adhesive capsulitis      Shoulder Exercises: Supine   Protraction Limitations attempted but too painful to tolerate    Other Supine Exercises attempted active assisted shoulder flexion with dowel and with clasped hands but too painful to tolerate      Shoulder Exercises: Standing   Other Standing Exercises self inferior/distraction 5x      Moist Heat Therapy   Number Minutes Moist Heat 5 Minutes    Moist Heat Location Shoulder   concurrent  re-education;Patient/family education;Manual techniques;Taping;Dry needling;Passive range of motion;Iontophoresis 4mg /ml Dexamethasone    PT Next Visit Plan recertification sent to  add ionto to treatment plan;  assess response to KT; try UE Ranger    PT Home Exercise Plan Access Code: [email or web address]           Patient will benefit from skilled therapeutic intervention in order to improve the following deficits and impairments:  Pain,Postural dysfunction,Impaired UE functional use,Increased fascial restricitons,Decreased strength,Decreased range of motion,Increased muscle spasms  Visit Diagnosis: Acute pain of right shoulder - Plan: PT plan of care cert/re-cert  Stiffness of right shoulder, not elsewhere classified - Plan: PT plan of care cert/re-cert  Muscle weakness (generalized) - Plan: PT plan of care cert/re-cert     Problem List Patient Active Problem List   Diagnosis Date Noted  . Rotator cuff syndrome of right shoulder 12/07/2020  . COVID-19 virus infection 09/20/2020  . Well woman exam with routine gynecological exam 01/07/2020  . Breast pain, right 01/07/2020  . Depression with anxiety 10/21/2019  . Hypercholesterolemia 01/19/2019  . Left lumbar radiculopathy 12/25/2018  . Right arm pain 11/26/2018  . Low back pain 11/26/2018  . Left knee pain 10/01/2018  . 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 Type 2 diabetes mellitus without complication, without long-term current use of insulin ([place]) 02/22/2017  . Loss of transverse plantar arch 10/31/2016  . Pronation deformity of ankle, acquired 10/31/2016  . Patellofemoral pain syndrome 09/23/2015  . OSA (obstructive sleep apnea) 03/31/2014  . Asthma, chronic 08/14/2013  . Leg edema 08/14/2013  . Osteoarthritis 09/11/2010  . GERD 03/17/2010  . SCIATICA 08/30/2009  . TROCHANTERIC BURSITIS 08/09/2009  . LEG PAIN, BILATERAL 09/30/2007  . Essential hypertension 09/22/2007   Ruben Im, PT 01/05/21 6:28 PM Phone: [phone] Fax: [phone] Alvera Singh 01/05/2021, 6:28 PM  [facility] [address] [place], [place],  [address] Phone: [phone]   Fax:  [phone]  Name: KASHA HOWETH MRN: [id number] Date of Birth: [birth year]

## 2021-01-11 ENCOUNTER — Other Ambulatory Visit: Payer: Self-pay

## 2021-01-11 ENCOUNTER — Ambulatory Visit (INDEPENDENT_AMBULATORY_CARE_PROVIDER_SITE_OTHER): Payer: BC Managed Care – PPO

## 2021-01-11 DIAGNOSIS — Z23 Encounter for immunization: Secondary | ICD-10-CM

## 2021-01-12 ENCOUNTER — Ambulatory Visit: Payer: BC Managed Care – PPO | Admitting: Family Medicine

## 2021-01-16 ENCOUNTER — Ambulatory Visit: Payer: BC Managed Care – PPO | Admitting: Physical Therapy

## 2021-01-16 ENCOUNTER — Other Ambulatory Visit: Payer: Self-pay

## 2021-01-16 DIAGNOSIS — M25511 Pain in right shoulder: Secondary | ICD-10-CM

## 2021-01-16 DIAGNOSIS — M25611 Stiffness of right shoulder, not elsewhere classified: Secondary | ICD-10-CM

## 2021-01-16 DIAGNOSIS — R293 Abnormal posture: Secondary | ICD-10-CM

## 2021-01-16 DIAGNOSIS — M6281 Muscle weakness (generalized): Secondary | ICD-10-CM

## 2021-01-16 NOTE — Patient Instructions (Addendum)

## 2021-01-16 NOTE — Therapy (Signed)
[facility] [address], [address] [place], [place], [address] Phone: [phone]   Fax:  [phone]  Physical Therapy Treatment  Patient Details  Name: [name] MRN: [id number] Date of Birth: 04-15-1961 Referring Provider (PT): Antoine Primas, DO   Encounter Date: 01/16/2021   PT End of Session - 01/16/21 1540    Visit Number 3    Date for PT Re-Evaluation 03/07/21    PT Start Time 0240    PT Stop Time 0325    PT Time Calculation (min) 45 min    Activity Tolerance Patient limited by pain;Patient tolerated treatment well    Behavior During Therapy [facility] for tasks assessed/performed           Past Medical History:  Diagnosis Date  . Allergy    Dr Stevphen [place]  . Cardiac arrhythmia   . Depression   . Diabetes mellitus without complication (HCC)   . Fibroids    uterine sees Dr. Neva Seat in [place]   . Genital warts   . Heart murmur   . High cholesterol   . Hypertension   . Irritable bowel syndrome (IBS)   . Low back pain    has a herniated lumbar disc sees Dr August Saucer    Past Surgical History:  Procedure Laterality Date  . ABDOMINOPLASTY  04/2013  . BREAST BIOPSY Right 06/2014  . COLONOSCOPY  02-10-15   per Dr. Rhea Belton, clear, repeat in 10 yrs   . LUMBAR LAMINECTOMY     L3-4 on 02-24-10 per Dr Otelia Sergeant   . MYOMECTOMY     laser ablation of uterine fibroids per Dr Dareen Piano 2000  . TUBAL LIGATION      There were no vitals filed for this visit.   Subjective Assessment - 01/16/21 1448    Subjective Pt states she has been having pain under the armpit and down the bicep area today and just feels like she is dropping things.  States the stretches on the table are no problem.  Sleeping is the worst.    Pertinent History foraminal narrowing at C5-6 bilaterally and at C6-7 on the left; acromioclavicular narrowing;    Diagnostic tests x-ray shows foraminal narrowing at C5-6 bilaterally and at C6-7 on the left;  acromioclavicular narrowing    Patient Stated Goals get rid of pain    Currently in Pain? Yes    Pain Score 5     Pain Location Shoulder    Pain Orientation Right    Pain Descriptors / Indicators Throbbing    Pain Type Acute pain    Pain Onset More than a month ago    Pain Frequency Intermittent    Aggravating Factors  Rt arm use    Multiple Pain Sites No                             [facility] Adult PT Treatment/Exercise - 01/16/21 0001      Shoulder Exercises: Standing   Flexion Strengthening;Right;10 reps   isometric   Extension Strengthening;Right;10 reps   isometric   Other Standing Exercises scap squeeze      Shoulder Exercises: ROM/Strengthening   Nustep L1 x 4 min PT present for status    Ranger seated flexion - 10x      Modalities   Modalities Iontophoresis      Iontophoresis   Type of Iontophoresis Dexamethasone    Location anterior humeral head - Rt  Dose 1.0 mL #1    Time 4-6 hour release      Manual Therapy   Joint Mobilization glenohumeral joint mobs distraction, inferior and posterior grade 3 in neutral and with attempted movement    Soft tissue mobilization right upper trap and scalenes    Kinesiotex Facilitate Muscle      Kinesiotix   Facilitate Muscle  2 deltoid strips vertical and 1 horizontal strip                    PT Short Term Goals - 01/05/21 1824      PT SHORT TERM GOAL #1   Title ind with initial HEP    Time 4    Period Weeks    Status New    Target Date 01/10/21      PT SHORT TERM GOAL #2   Title Rt shoulder flexion and abduction AROM to 100 for improved ability to reach to shelves    Time 4    Status New      PT SHORT TERM GOAL #3   Title reports 30% less pain in Rt shoulder due to imrpoved strength    Time 4    Period Weeks    Target Date 01/10/21             PT Long Term Goals - 01/05/21 1824      PT LONG TERM GOAL #1   Title ind with advanced HEP    Time 8    Period Weeks    Status New       PT LONG TERM GOAL #2   Title 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 with minimal discomfort in the shoulder or at least 75% improved    Time 8    Period Weeks    Status New      PT LONG TERM GOAL #3   Title Rt shoulder flexion and abduction at least 150 deg AROM without pain in order to reach overhead    Time 8    Period Weeks    Status New      PT LONG TERM GOAL #4   Title FOTO to improve by 25 points    Baseline based on intake predictions    Time 8    Period Weeks    Status New      PT LONG TERM GOAL #5   Title able to lift at least 5 lbs for lifting purse with her Rt arm    Time 8    Period Weeks    Status New                 Plan - 01/16/21 1643    Clinical Impression Statement Pt tolerated isometric exercises well today and felt better afterwards.  Pt needed cues to keep scapula down and back.  Orders were signed to add ionto so that was applied and info given today.  Pt was very irritable with AA/ROM and reported pain with UE ranger and nustep.  No increased pain after exercises but was pain during.  Pt will benefit fromskilled PT to continue working on strength and scap stability and pain management.    PT Treatment/Interventions Cryotherapy;ADLs/Self Care Home Management;Electrical Stimulation;Moist Heat;Traction;Therapeutic activities;Therapeutic exercise;Neuromuscular re-education;Patient/family education;Manual techniques;Taping;Dry needling;Passive range of motion;Iontophoresis 4mg /ml Dexamethasone    PT Next Visit Plan low trap activation; f/u on isometric ex's; scap stability; AA/ROM as tolerated try pulleys, continue ionto and Ktape as needed  PT Home Exercise Plan Access Code: [email or web address]    Consulted and Agree with Plan of Care Patient           Patient will benefit from skilled therapeutic intervention in order to improve the following deficits and impairments:  Pain,Postural dysfunction,Impaired UE functional  use,Increased fascial restricitons,Decreased strength,Decreased range of motion,Increased muscle spasms  Visit Diagnosis: Acute pain of right shoulder  Stiffness of right shoulder, not elsewhere classified  Muscle weakness (generalized)  Abnormal posture     Problem List Patient Active Problem List   Diagnosis Date Noted  . Rotator cuff syndrome of right shoulder 12/07/2020  . COVID-19 virus infection 09/20/2020  . Well woman exam with routine gynecological exam 01/07/2020  . Breast pain, right 01/07/2020  . Depression with anxiety 10/21/2019  . Hypercholesterolemia 01/19/2019  . Left lumbar radiculopathy 12/25/2018  . Right arm pain 11/26/2018  . Low back pain 11/26/2018  . Left knee pain 10/01/2018  . 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 Type 2 diabetes mellitus without complication, without long-term current use of insulin (HCC) 02/22/2017  . Loss of transverse plantar arch 10/31/2016  . Pronation deformity of ankle, acquired 10/31/2016  . Patellofemoral pain syndrome 09/23/2015  . OSA (obstructive sleep apnea) 03/31/2014  . Asthma, chronic 08/14/2013  . Leg edema 08/14/2013  . Osteoarthritis 09/11/2010  . GERD 03/17/2010  . SCIATICA 08/30/2009  . TROCHANTERIC BURSITIS 08/09/2009  . LEG PAIN, BILATERAL 09/30/2007  . Essential hypertension 09/22/2007    Junious Silk, PT 01/16/2021, 5:24 PM  [facility] [address] [place], [place], [address] Phone: [phone]   Fax:  [phone]  Name: [name] MRN: [id number] Date of Birth: [birth year]

## 2021-01-24 ENCOUNTER — Other Ambulatory Visit: Payer: Self-pay

## 2021-01-24 ENCOUNTER — Ambulatory Visit: Payer: BC Managed Care – PPO | Attending: Family Medicine | Admitting: Physical Therapy

## 2021-01-24 DIAGNOSIS — M6281 Muscle weakness (generalized): Secondary | ICD-10-CM | POA: Diagnosis present

## 2021-01-24 DIAGNOSIS — M25611 Stiffness of right shoulder, not elsewhere classified: Secondary | ICD-10-CM | POA: Diagnosis present

## 2021-01-24 DIAGNOSIS — M25511 Pain in right shoulder: Secondary | ICD-10-CM

## 2021-01-24 NOTE — Therapy (Signed)
strength,Decreased range of motion,Increased muscle spasms  Visit Diagnosis: Acute pain of right shoulder  Stiffness of right shoulder, not elsewhere classified  Muscle weakness (generalized)     Problem List Patient Active Problem List   Diagnosis Date Noted  . Rotator cuff syndrome of  right shoulder 12/07/2020  . COVID-19 virus infection 09/20/2020  . Well woman exam with routine gynecological exam 01/07/2020  . Breast pain, right 01/07/2020  . Depression with anxiety 10/21/2019  . Hypercholesterolemia 01/19/2019  . Left lumbar radiculopathy 12/25/2018  . Right arm pain 11/26/2018  . Low back pain 11/26/2018  . Left knee pain 10/01/2018  . 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 Type 2 diabetes mellitus without complication, without long-term current use of insulin ([place]) 02/22/2017  . Loss of transverse plantar arch 10/31/2016  . Pronation deformity of ankle, acquired 10/31/2016  . Patellofemoral pain syndrome 09/23/2015  . OSA (obstructive sleep apnea) 03/31/2014  . Asthma, chronic 08/14/2013  . Leg edema 08/14/2013  . Osteoarthritis 09/11/2010  . GERD 03/17/2010  . SCIATICA 08/30/2009  . TROCHANTERIC BURSITIS 08/09/2009  . LEG PAIN, BILATERAL 09/30/2007  . Essential hypertension 09/22/2007   Ruben Im, PT 01/24/21 4:04 PM Phone: [phone] Fax: [phone] Alvera Singh 01/24/2021, 4:04 PM  [facility] [address] [place], [place], [address] Phone: [phone]   Fax:  [phone]  Name: [name] MRN: [id number] Date of Birth: 10/20/1961  [facility] [address] [place], [place], [address] Phone: [phone]   Fax:  [phone]  Physical Therapy Treatment  Patient Details  Name: [name] MRN: [id number] Date of Birth: 1961-11-03 Referring Provider (PT): Hulan Saas, DO   Encounter Date: 01/24/2021   PT End of Session - 01/24/21 1557    Visit Number 4    Date for PT Re-Evaluation 03/07/21    PT Start Time 2979    PT Stop Time 8921   very painful; limited ex tolerance   PT Time Calculation (min) 29 min    Activity Tolerance Patient limited by pain           Past Medical History:  Diagnosis Date  . Allergy    Dr Caprice Red  . Cardiac arrhythmia   . Depression   . Diabetes mellitus without complication ([place])   . Fibroids    uterine sees Dr. Carlota Raspberry in [place]   . Genital warts   . Heart murmur   . High cholesterol   . Hypertension   . Irritable bowel syndrome (IBS)   . Low back pain    has a herniated lumbar disc sees Dr Marlou Sa    Past Surgical History:  Procedure Laterality Date  . ABDOMINOPLASTY  04/2013  . BREAST BIOPSY Right 06/2014  . COLONOSCOPY  02-10-15   per Dr. Hilarie Fredrickson, clear, repeat in 10 yrs   . LUMBAR LAMINECTOMY     L3-4 on 02-24-10 per Dr Louanne Skye   . MYOMECTOMY     laser ablation of uterine fibroids per Dr Ouida Sills 2000  . TUBAL LIGATION      There were no vitals filed for this visit.   Subjective Assessment - 01/24/21 1531    Subjective It's bothering me.  I've been using my arm to pick up stuff at work.  Lot of repetitive work.    Pertinent History foraminal narrowing at C5-6 bilaterally and at C6-7 on the left; acromioclavicular narrowing;    Diagnostic tests x-ray shows foraminal narrowing at C5-6 bilaterally and at C6-7 on the left; acromioclavicular narrowing    Currently in Pain? Yes    Pain Score 8     Pain Location Shoulder    Pain Orientation Right    Pain Type Acute pain               [facility] PT Assessment - 01/24/21 0001      AROM   Right Shoulder Flexion 146 Degrees   hurts to lower down   Right Shoulder ABduction 20 Degrees   attempted but too painful                        [facility] Adult PT Treatment/Exercise - 01/24/21 0001      Self-Care   Other Self-Care Comments  discussed use of ice and recommendation for MD follow up      Shoulder Exercises: Pulleys   Flexion 2 minutes    Flexion Limitations very painful      Shoulder Exercises: Isometric Strengthening   Flexion 5X5"    Extension 5X5"    Extension Limitations straight and bent elbow still painful    ABduction 5X5"      Iontophoresis   Type of Iontophoresis Dexamethasone    Location lateral humeral head - Rt    Dose 1.0 mL #2    Time 4-6 hour release      Kinesiotix   Facilitate Muscle  strength,Decreased range of motion,Increased muscle spasms  Visit Diagnosis: Acute pain of right shoulder  Stiffness of right shoulder, not elsewhere classified  Muscle weakness (generalized)     Problem List Patient Active Problem List   Diagnosis Date Noted  . Rotator cuff syndrome of  right shoulder 12/07/2020  . COVID-19 virus infection 09/20/2020  . Well woman exam with routine gynecological exam 01/07/2020  . Breast pain, right 01/07/2020  . Depression with anxiety 10/21/2019  . Hypercholesterolemia 01/19/2019  . Left lumbar radiculopathy 12/25/2018  . Right arm pain 11/26/2018  . Low back pain 11/26/2018  . Left knee pain 10/01/2018  . 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 Type 2 diabetes mellitus without complication, without long-term current use of insulin ([place]) 02/22/2017  . Loss of transverse plantar arch 10/31/2016  . Pronation deformity of ankle, acquired 10/31/2016  . Patellofemoral pain syndrome 09/23/2015  . OSA (obstructive sleep apnea) 03/31/2014  . Asthma, chronic 08/14/2013  . Leg edema 08/14/2013  . Osteoarthritis 09/11/2010  . GERD 03/17/2010  . SCIATICA 08/30/2009  . TROCHANTERIC BURSITIS 08/09/2009  . LEG PAIN, BILATERAL 09/30/2007  . Essential hypertension 09/22/2007   Ruben Im, PT 01/24/21 4:04 PM Phone: [phone] Fax: [phone] Alvera Singh 01/24/2021, 4:04 PM  [facility] [address] [place], [place], [address] Phone: [phone]   Fax:  [phone]  Name: [name] MRN: [id number] Date of Birth: 10/20/1961

## 2021-01-31 ENCOUNTER — Encounter: Payer: BC Managed Care – PPO | Admitting: Physical Therapy

## 2021-02-01 ENCOUNTER — Other Ambulatory Visit: Payer: Self-pay | Admitting: Family Medicine

## 2021-02-01 NOTE — Progress Notes (Signed)
[facility] [address] [place] Phone: [phone] Subjective:   I Kandace Blitz am serving as a Education administrator for Dr. Hulan Saas.  This visit occurred during the SARS-CoV-2 public health emergency.  Safety protocols were in place, including screening questions prior to the visit, additional usage of staff PPE, and extensive cleaning of exam room while observing appropriate contact time as indicated for disinfecting solutions.   I'm seeing this patient by the request  of:  Laurey Morale, MD  CC: Neck and shoulder pain follow-up  JOI:[id number]   12/07/2020 Patient is signs and symptoms consistent with more of a rotator cuff syndrome.  We discussed with patient in great length about different treatment options and patient has elected to start with formal physical therapy.  In addition to this we will do topical anti-inflammatories, oral anti-inflammatories meloxicam 7.5 mg given and warned patient to watch for stomach pain.  Patient will avoid all other anti-inflammatories.  Patient feels that the Lyrica makes her more tired so we will discontinue this and start gabapentin at 200 mg at night.  Patient could have some mild cervical radiculopathy.  Patient depending on how patient is responding could be a candidate for injections at follow-up in 4 weeks significant increase in weakness may need to consider advanced imaging.  Update 02/02/2021 [name] is a 60 y.o. female coming in with complaint of right shoulder pain. Patient has been going to physical therapy and states that it is not helping to reduce her pain. Patient states she feels about the same.  Patient states that it is affecting daily activities, radiating down the arm.  Patient states that there is some mild neck pain associated with it.  Patient's x-ray showed that patient did have what appeared to be some mild calcific tendinitis noted on her x-ray but patient's neck did have moderate  to severe degenerative disc disease of the cervical spine.       Past Medical History:  Diagnosis Date  . Allergy    Dr Caprice Red  . Cardiac arrhythmia   . Depression   . Diabetes mellitus without complication ([place])   . Fibroids    uterine sees Dr. Carlota Raspberry in [place]   . Genital warts   . Heart murmur   . High cholesterol   . Hypertension   . Irritable bowel syndrome (IBS)   . Low back pain    has a herniated lumbar disc sees Dr Marlou Sa   Past Surgical History:  Procedure Laterality Date  . ABDOMINOPLASTY  04/2013  . BREAST BIOPSY Right 06/2014  . COLONOSCOPY  02-10-15   per Dr. Hilarie Fredrickson, clear, repeat in 10 yrs   . LUMBAR LAMINECTOMY     L3-4 on 02-24-10 per Dr Louanne Skye   . MYOMECTOMY     laser ablation of uterine fibroids per Dr Ouida Sills 2000  . TUBAL LIGATION     Social History   Socioeconomic History  . Marital status: Married    Spouse name: Not on file  . Number of children: 4  . Years of education: Not on file  . Highest education level: Some college, no degree  Occupational History  . Occupation: Pharmacist, hospital  Tobacco Use  . Smoking status: Former Smoker    Packs/day: 0.50    Years: 1.00    Pack years: 0.50    Types: Cigarettes    Quit date: 12/17/1996    Years since quitting: 24.1  . Smokeless tobacco: Never Used  Vaping Use  . Vaping Use: Never used  Substance and Sexual Activity  . Alcohol use: Yes    Alcohol/week: 0.0 standard drinks    Comment: occ  . Drug use: No  . Sexual activity: Yes    Birth control/protection: Surgical  Other Topics Concern  . Not on file  Social History Narrative  . Not on file   Social Determinants of Health   Financial Resource Strain: Not on file  Food Insecurity: Not on file  Transportation Needs: Not on file  Physical Activity: Not on file  Stress: Not on file  Social Connections: Not on file   Allergies  Allergen Reactions  . Iodinated Diagnostic Agents Swelling    Tongue swelling  . Iodine Swelling     Tongue swelling  . Peanut-Containing Drug Products Anaphylaxis  . Hctz [Hydrochlorothiazide] Other (See Comments)    Blood pressure goes up  . Shellfish-Derived Products Other (See Comments)     Per allergy test  . Cephalexin Rash  . Ciprofloxacin Diarrhea   Family History  Problem Relation Age of Onset  . Arthritis Other        Family Hx  . Diabetes Other        Family Hx 1st degree relative  . Hyperlipidemia Other        Family Hx  . Hypertension Other        Family Hx  . Prostate cancer Other        Family Hx 1st Degree relative <50  . Stroke Other        First Degree Female <50  . Diabetes Mother   . Hypertension Mother   . Diabetes Father   . Hypertension Father   . Heart disease Father   . Diabetes Maternal Grandmother   . Hypertension Maternal Grandmother   . Diabetes Maternal Grandfather   . Hypertension Maternal Grandfather   . Diabetes Paternal Grandmother   . Hypertension Paternal Grandmother   . Diabetes Paternal Grandfather   . Hypertension Paternal Grandfather   . Colon cancer Neg Hx   . Breast cancer Neg Hx     Current Outpatient Medications (Endocrine & Metabolic):  .  glyBURIDE (DIABETA) 5 MG tablet, Take 1 tablet (5 mg total) by mouth daily. .  metFORMIN (GLUCOPHAGE) 500 MG tablet, TAKE ONE TABLET BY MOUTH TWICE A DAY WITH A MEAL  Current Outpatient Medications (Cardiovascular):  .  amLODipine (NORVASC) 10 MG tablet, Take 1 tablet (10 mg total) by mouth daily. Marland Kitchen  atorvastatin (LIPITOR) 20 MG tablet, Take 1 tablet (20 mg total) by mouth daily. Marland Kitchen  lisinopril (ZESTRIL) 20 MG tablet, Take 1 tablet (20 mg total) by mouth 2 (two) times daily.  Current Outpatient Medications (Respiratory):  .  albuterol (VENTOLIN HFA) 108 (90 Base) MCG/ACT inhaler, Inhale 2 puffs into the lungs every 4 (four) hours as needed for wheezing or shortness of breath. Marland Kitchen  HYDROcodone-homatropine (HYDROMET) 5-1.5 MG/5ML syrup, Take 5 mLs by mouth every 6 (six) hours as needed for  cough.  Current Outpatient Medications (Analgesics):  .  meloxicam (MOBIC) 7.5 MG tablet, Take 1 tablet (7.5 mg total) by mouth daily.   Current Outpatient Medications (Other):  .  blood glucose meter kit and supplies, Dispense based on patient and insurance preference. Test once a day. E11.9 .  cholecalciferol (VITAMIN D) 1000 UNITS tablet, Take 1,000 Units by mouth daily.  Marland Kitchen  docusate sodium (COLACE) 100 MG capsule, Take 1 capsule (100 mg total) by mouth 2 (two)  times daily. Marland Kitchen  gabapentin (NEURONTIN) 100 MG capsule, Take 2 capsules (200 mg total) by mouth at bedtime. .  Magnesium 500 MG TABS, Take 1 tablet by mouth daily.  .  Multiple Vitamins-Minerals (MULTIVITAMIN WITH MINERALS) tablet, Take 1 tablet by mouth daily. .  potassium chloride (KLOR-CON) 10 MEQ tablet, Take 2 tablets (20 mEq total) by mouth daily. .  valACYclovir (VALTREX) 500 MG tablet, TAKE ONE TABLET BY MOUTH TWICE A DAY AS NEEDED FOR OUTBREAKS .  venlafaxine XR (EFFEXOR XR) 150 MG 24 hr capsule, Take 1 capsule (150 mg total) by mouth daily with breakfast.   Reviewed prior external information including notes and imaging from  primary care provider As well as notes that were available from care everywhere and other healthcare systems.  Past medical history, social, surgical and family history all reviewed in electronic medical record.  No pertanent information unless stated regarding to the chief complaint.   Review of Systems:  No headache, visual changes, nausea, vomiting, diarrhea, constipation, dizziness, abdominal pain, skin rash, fevers, chills, night sweats, weight loss, swollen lymph nodes, body aches, joint swelling, chest pain, shortness of breath, mood changes. POSITIVE muscle aches  Objective  Blood pressure 110/80, pulse 77, height 5' 3"  (1.6 m), weight 179 lb (81.2 kg), last menstrual period 10/18/2015, SpO2 100 %.   General: No apparent distress alert and oriented x3 mood and affect normal, dressed  appropriately.  HEENT: Pupils equal, extraocular movements intact  Respiratory: Patient's speak in full sentences and does not appear short of breath  Cardiovascular: No lower extremity edema, non tender, no erythema  Gait normal with good balance and coordination.  MSK: Right shoulder exam does show positive impingement.  Rotator cuff does appear to be intact though.  Patient does have though voluntary guarding with range of motion exercises.  Positive crossover. Neck exam though does show that she does have some loss of lordosis of the neck and extension does cause some radiation of pain down the arm.  Does not go to the hand.  Full strength of the hand noted.    Impression and Recommendations:     The above documentation has been reviewed and is accurate and complete Lyndal Pulley, DO

## 2021-02-02 ENCOUNTER — Encounter: Payer: Self-pay | Admitting: Family Medicine

## 2021-02-02 ENCOUNTER — Ambulatory Visit: Payer: BC Managed Care – PPO | Admitting: Family Medicine

## 2021-02-02 ENCOUNTER — Other Ambulatory Visit: Payer: Self-pay

## 2021-02-02 ENCOUNTER — Encounter: Payer: BC Managed Care – PPO | Admitting: Physical Therapy

## 2021-02-02 VITALS — BP 110/80 | HR 77 | Ht 63.0 in | Wt 179.0 lb

## 2021-02-02 DIAGNOSIS — M25511 Pain in right shoulder: Secondary | ICD-10-CM

## 2021-02-02 DIAGNOSIS — M542 Cervicalgia: Secondary | ICD-10-CM | POA: Diagnosis not present

## 2021-02-02 DIAGNOSIS — M75101 Unspecified rotator cuff tear or rupture of right shoulder, not specified as traumatic: Secondary | ICD-10-CM | POA: Diagnosis not present

## 2021-02-02 DIAGNOSIS — M5030 Other cervical disc degeneration, unspecified cervical region: Secondary | ICD-10-CM | POA: Insufficient documentation

## 2021-02-02 NOTE — Patient Instructions (Addendum)
Good to see you MRI shoulder and neck Will write you in mychart about next steps

## 2021-02-02 NOTE — Assessment & Plan Note (Signed)
Likely still secondary to the shoulder.  Patient declined injection.  Patient has failed all other conservative therapy at this time including formal physical therapy.  I do believe that MRI is necessary.  We will avoid MR arthrogram though secondary to patient having an allergy to dye.  We will get MRI of the neck as well secondary to cervical radiculopathy that is noted today that could be within the differential.  Patient would be a candidate for injections in either spot or potential surgical intervention if necessary.  Patient is in agreement with this plan and will follow up after imaging.

## 2021-02-14 ENCOUNTER — Ambulatory Visit: Payer: BC Managed Care – PPO | Attending: Family Medicine | Admitting: Physical Therapy

## 2021-02-14 ENCOUNTER — Other Ambulatory Visit: Payer: Self-pay

## 2021-02-14 DIAGNOSIS — M25611 Stiffness of right shoulder, not elsewhere classified: Secondary | ICD-10-CM | POA: Diagnosis present

## 2021-02-14 DIAGNOSIS — R293 Abnormal posture: Secondary | ICD-10-CM | POA: Diagnosis present

## 2021-02-14 DIAGNOSIS — M25511 Pain in right shoulder: Secondary | ICD-10-CM | POA: Diagnosis present

## 2021-02-14 DIAGNOSIS — M6281 Muscle weakness (generalized): Secondary | ICD-10-CM

## 2021-02-14 NOTE — Therapy (Signed)
[facility] [address] [place], [place], [address] Phone: [phone]   Fax:  [phone]  Physical Therapy Treatment  Patient Details  Name: [name] MRN: [id number] Date of Birth: Feb 23, 1961 Referring Provider (PT): Antoine Primas, DO   Encounter Date: 02/14/2021   PT End of Session - 02/14/21 1645    Visit Number 5    Date for PT Re-Evaluation 03/07/21    PT Start Time 1445    PT Stop Time 1516   low tolerance for ex   PT Time Calculation (min) 31 min    Activity Tolerance Patient limited by pain           Past Medical History:  Diagnosis Date  . Allergy    Dr Stevphen [place]  . Cardiac arrhythmia   . Depression   . Diabetes mellitus without complication (HCC)   . Fibroids    uterine sees Dr. Neva Seat in [place]   . Genital warts   . Heart murmur   . High cholesterol   . Hypertension   . Irritable bowel syndrome (IBS)   . Low back pain    has a herniated lumbar disc sees Dr August Saucer    Past Surgical History:  Procedure Laterality Date  . ABDOMINOPLASTY  04/2013  . BREAST BIOPSY Right 06/2014  . COLONOSCOPY  02-10-15   per Dr. Rhea Belton, clear, repeat in 10 yrs   . LUMBAR LAMINECTOMY     L3-4 on 02-24-10 per Dr Otelia Sergeant   . MYOMECTOMY     laser ablation of uterine fibroids per Dr Dareen Piano 2000  . TUBAL LIGATION      There were no vitals filed for this visit.   Subjective Assessment - 02/14/21 1452    Subjective I'm having an MRI on 3/13. No injection b/c it would elevate blood sugars.   Sleeping a little better.  I avoid overhead motions.  Avoiding picking up things.  Doing bicep curls, row OK,  Reports no benefit from iontophoresis    Pertinent History foraminal narrowing at C5-6 bilaterally and at C6-7 on the left; acromioclavicular narrowing;    Diagnostic tests x-ray shows foraminal narrowing at C5-6 bilaterally and at C6-7 on the left; acromioclavicular narrowing    Patient Stated Goals get  rid of pain    Currently in Pain? Yes    Pain Score 8     Pain Location Shoulder    Pain Orientation Right    Pain Type Acute pain                             [facility] Adult PT Treatment/Exercise - 02/14/21 0001      Shoulder Exercises: Supine   Other Supine Exercises attempted active assisted shoulder flexion with dowel and with clasped hands but too painful to tolerate      Shoulder Exercises: Pulleys   Flexion 2 minutes    Flexion Limitations low level pain      Shoulder Exercises: Isometric Strengthening   Flexion 5X5"    Extension 5X5"    Extension Limitations elbow bent    ABduction 5X5"                  PT Education - 02/14/21 1645    Education Details pulley flexion; isometrics: flex, abd, extension, internal rotation    Person(s) Educated Patient    Methods Explanation;Demonstration;Handout    Comprehension Returned demonstration;Verbalized understanding  PT Short Term Goals - 01/24/21 1603      PT SHORT TERM GOAL #1   Title ind with initial HEP    Status Achieved      PT SHORT TERM GOAL #2   Title Rt shoulder flexion and abduction AROM to 100 for improved ability to reach to shelves    Baseline flexion only > 140 degrees    Status Partially Met      PT SHORT TERM GOAL #3   Title reports 30% less pain in Rt shoulder due to imrpoved strength    Time 4    Period Weeks    Status On-going             PT Long Term Goals - 01/05/21 1824      PT LONG TERM GOAL #1   Title ind with advanced HEP    Time 8    Period Weeks    Status New      PT LONG TERM GOAL #2   Title 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 with minimal discomfort in the shoulder or at least 75% improved    Time 8    Period Weeks    Status New      PT LONG TERM GOAL #3   Title Rt shoulder flexion and abduction at least 150 deg AROM without pain in order to reach overhead    Time 8    Period Weeks    Status New       PT LONG TERM GOAL #4   Title FOTO to improve by 25 points    Baseline based on intake predictions    Time 8    Period Weeks    Status New      PT LONG TERM GOAL #5   Title able to lift at least 5 lbs for lifting purse with her Rt arm    Time 8    Period Weeks    Status New                 Plan - 02/14/21 1502    Clinical Impression Statement The patient continues to report severe pain and is limited in movement tolerance.  Out of concern for risk of adhesive capsulitis, encouraged active assisted ROM however she is unable to tolerate supine self assisted flexion.  She does fairly well with pulleys and recommended an inexpensive option for home.  She is also able to perform submaximal shoulder isometrics with bent elbow to decrease muscle atrophy.  She reports no benefit from ionto or other manual therapy interventions.  Recommend to hold PT at this time while she continues with this basic HEP until after her MRI.    Examination-Activity Limitations Lift;Reach Overhead;Carry;Sleep    Examination-Participation Restrictions Cleaning;Community Activity;Occupation    Rehab Potential Excellent    PT Frequency 2x / week    PT Duration 8 weeks    PT Treatment/Interventions Cryotherapy;ADLs/Self Care Home Management;Electrical Stimulation;Moist Heat;Traction;Therapeutic activities;Therapeutic exercise;Neuromuscular re-education;Patient/family education;Manual techniques;Taping;Dry needling;Passive range of motion;Iontophoresis 4mg /ml Dexamethasone    PT Next Visit Plan follow up after shoulder MRI    PT Home Exercise Plan Access Code: [email or web address]    Recommended Other Services home pulleys           Patient will benefit from skilled therapeutic intervention in order to improve the following deficits and impairments:  Pain,Postural dysfunction,Impaired UE functional use,Increased fascial restricitons,Decreased strength,Decreased range of motion,Increased muscle spasms  Visit  Diagnosis: Acute pain of right  shoulder  Stiffness of right shoulder, not elsewhere classified  Muscle weakness (generalized)  Abnormal posture     Problem List Patient Active Problem List   Diagnosis Date Noted  . Degenerative cervical disc 02/02/2021  . Rotator cuff syndrome of right shoulder 12/07/2020  . COVID-19 virus infection 09/20/2020  . Well woman exam with routine gynecological exam 01/07/2020  . Breast pain, right 01/07/2020  . Depression with anxiety 10/21/2019  . Hypercholesterolemia 01/19/2019  . Left lumbar radiculopathy 12/25/2018  . Right arm pain 11/26/2018  . Low back pain 11/26/2018  . Left knee pain 10/01/2018  . 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 Type 2 diabetes mellitus without complication, without long-term current use of insulin (HCC) 02/22/2017  . Loss of transverse plantar arch 10/31/2016  . Pronation deformity of ankle, acquired 10/31/2016  . Patellofemoral pain syndrome 09/23/2015  . OSA (obstructive sleep apnea) 03/31/2014  . Asthma, chronic 08/14/2013  . Leg edema 08/14/2013  . Osteoarthritis 09/11/2010  . GERD 03/17/2010  . SCIATICA 08/30/2009  . TROCHANTERIC BURSITIS 08/09/2009  . LEG PAIN, BILATERAL 09/30/2007  . Essential hypertension 09/22/2007   Lavinia Sharps, PT 02/14/21 4:53 PM Phone: [phone] Fax: [phone] Vivien Presto 02/14/2021, 4:52 PM  [facility] [address] [place], [place], [address] Phone: [phone]   Fax:  [phone]  Name: [name] MRN: [id number] Date of Birth: August 13, 1961

## 2021-02-14 NOTE — Patient Instructions (Signed)
Access Code: [email or web address] URL: [email or web address] Date: 02/14/2021 Prepared by: Ruben Im  Exercises Flexion-Extension Shoulder Pendulum with Table Support - 1 x daily - 7 x weekly - 3 sets - 10 reps Seated Shoulder Flexion Slide at Table Top with Forearm in Neutral - 3 x daily - 7 x weekly - 1 sets - 10 reps - 5 sec hold Seated Shoulder External Rotation PROM on Table - 1 x daily - 7 x weekly - 3 sets - 10 reps Seated Scapular Retraction - 3 x daily - 7 x weekly - 10 reps - 1 sets - 5 sec hold Isometric Shoulder Flexion at Wall - 1 x daily - 7 x weekly - 3 sets - 10 reps Isometric Shoulder Abduction - Arm Straight at Wall - 1 x daily - 7 x weekly - 1 sets - 5 reps - 5 hold Isometric Shoulder Extension at Wall - 1 x daily - 7 x weekly - 3 sets - 10 reps Standing Isometric Shoulder Internal Rotation at Doorway - 1 x daily - 7 x weekly - 1 sets - 5 reps - 5 hold Seated Shoulder Flexion AAROM with Pulley Behind - 3 x daily - 7 x weekly - 1 sets - 10 reps

## 2021-02-16 ENCOUNTER — Encounter: Payer: BC Managed Care – PPO | Admitting: Physical Therapy

## 2021-02-20 ENCOUNTER — Encounter: Payer: Self-pay | Admitting: Family Medicine

## 2021-02-20 ENCOUNTER — Telehealth (INDEPENDENT_AMBULATORY_CARE_PROVIDER_SITE_OTHER): Payer: BC Managed Care – PPO | Admitting: Family Medicine

## 2021-02-20 VITALS — Temp 97.0°F | Wt 179.0 lb

## 2021-02-20 DIAGNOSIS — B349 Viral infection, unspecified: Secondary | ICD-10-CM

## 2021-02-20 MED ORDER — ONDANSETRON HCL 8 MG PO TABS: 8.0000 mg | ORAL_TABLET | Freq: Four times a day (QID) | ORAL | 0 refills | Status: DC | PRN

## 2021-02-20 NOTE — Progress Notes (Signed)
Subjective:    Patient ID: [name], female    DOB: 1961/10/21, 60 y.o.   MRN: [id number]  HPI Virtual Visit via Video Note  I connected with the patient on 02/20/21 at  9:30 AM EST by a video enabled telemedicine application and verified that I am speaking with the correct person using two identifiers.  Location patient: home Location provider:work or home office Persons participating in the virtual visit: patient, provider  I discussed the limitations of evaluation and management by telemedicine and the availability of in person appointments. The patient expressed understanding and agreed to proceed.   HPI: Here for 4 days of chills, chest congestion, dry cough, abdominal cramps, nausea and diarrhea. No chest pain or SOB. No body aches. She is drinking plenty of fluids.    ROS: See pertinent positives and negatives per HPI.  Past Medical History:  Diagnosis Date  . Allergy    Dr Stevphen Rochester  . Cardiac arrhythmia   . Depression   . Diabetes mellitus without complication (HCC)   . Fibroids    uterine sees Dr. Neva Seat in [place]   . Genital warts   . Heart murmur   . High cholesterol   . Hypertension   . Irritable bowel syndrome (IBS)   . Low back pain    has a herniated lumbar disc sees Dr August Saucer    Past Surgical History:  Procedure Laterality Date  . ABDOMINOPLASTY  04/2013  . BREAST BIOPSY Right 06/2014  . COLONOSCOPY  02-10-15   per Dr. Rhea Belton, clear, repeat in 10 yrs   . LUMBAR LAMINECTOMY     L3-4 on 02-24-10 per Dr Otelia Sergeant   . MYOMECTOMY     laser ablation of uterine fibroids per Dr Dareen Piano 2000  . TUBAL LIGATION      Family History  Problem Relation Age of Onset  . Arthritis Other        Family Hx  . Diabetes Other        Family Hx 1st degree relative  . Hyperlipidemia Other        Family Hx  . Hypertension Other        Family Hx  . Prostate cancer Other        Family Hx 1st Degree relative <50  . Stroke Other        First Degree Female <50   . Diabetes Mother   . Hypertension Mother   . Diabetes Father   . Hypertension Father   . Heart disease Father   . Diabetes Maternal Grandmother   . Hypertension Maternal Grandmother   . Diabetes Maternal Grandfather   . Hypertension Maternal Grandfather   . Diabetes Paternal Grandmother   . Hypertension Paternal Grandmother   . Diabetes Paternal Grandfather   . Hypertension Paternal Grandfather   . Colon cancer Neg Hx   . Breast cancer Neg Hx      Current Outpatient Medications:  .  albuterol (VENTOLIN HFA) 108 (90 Base) MCG/ACT inhaler, Inhale 2 puffs into the lungs every 4 (four) hours as needed for wheezing or shortness of breath., Disp: 18 g, Rfl: 2 .  amLODipine (NORVASC) 10 MG tablet, Take 1 tablet (10 mg total) by mouth daily., Disp: 90 tablet, Rfl: 3 .  atorvastatin (LIPITOR) 20 MG tablet, Take 1 tablet (20 mg total) by mouth daily., Disp: 90 tablet, Rfl: 3 .  blood glucose meter kit and supplies, Dispense based on patient and insurance preference. Test once a day. E11.9,  Disp: 1 each, Rfl: 0 .  cholecalciferol (VITAMIN D) 1000 UNITS tablet, Take 1,000 Units by mouth daily. , Disp: , Rfl:  .  docusate sodium (COLACE) 100 MG capsule, Take 1 capsule (100 mg total) by mouth 2 (two) times daily., Disp: 60 capsule, Rfl: 5 .  gabapentin (NEURONTIN) 100 MG capsule, Take 2 capsules (200 mg total) by mouth at bedtime., Disp: 180 capsule, Rfl: 0 .  glyBURIDE (DIABETA) 5 MG tablet, Take 1 tablet (5 mg total) by mouth daily., Disp: 90 tablet, Rfl: 3 .  lisinopril (ZESTRIL) 20 MG tablet, Take 1 tablet (20 mg total) by mouth 2 (two) times daily., Disp: 180 tablet, Rfl: 3 .  Magnesium 500 MG TABS, Take 1 tablet by mouth daily. , Disp: , Rfl:  .  meloxicam (MOBIC) 7.5 MG tablet, Take 1 tablet (7.5 mg total) by mouth daily., Disp: 30 tablet, Rfl: 0 .  metFORMIN (GLUCOPHAGE) 500 MG tablet, TAKE ONE TABLET BY MOUTH TWICE A DAY WITH A MEAL, Disp: 180 tablet, Rfl: 1 .  Multiple Vitamins-Minerals  (MULTIVITAMIN WITH MINERALS) tablet, Take 1 tablet by mouth daily., Disp: , Rfl:  .  ondansetron (ZOFRAN) 8 MG tablet, Take 1 tablet (8 mg total) by mouth every 6 (six) hours as needed for nausea or vomiting., Disp: 30 tablet, Rfl: 0 .  potassium chloride (KLOR-CON) 10 MEQ tablet, Take 2 tablets (20 mEq total) by mouth daily., Disp: 180 tablet, Rfl: 3 .  valACYclovir (VALTREX) 500 MG tablet, TAKE ONE TABLET BY MOUTH TWICE A DAY AS NEEDED FOR OUTBREAKS, Disp: 30 tablet, Rfl: 4 .  venlafaxine XR (EFFEXOR XR) 150 MG 24 hr capsule, Take 1 capsule (150 mg total) by mouth daily with breakfast., Disp: 90 capsule, Rfl: 3  EXAM:  VITALS per patient if applicable:  GENERAL: alert, oriented, appears well and in no acute distress  HEENT: atraumatic, conjunttiva clear, no obvious abnormalities on inspection of external nose and ears  NECK: normal movements of the head and neck  LUNGS: on inspection no signs of respiratory distress, breathing rate appears normal, no obvious gross SOB, gasping or wheezing  CV: no obvious cyanosis  MS: moves all visible extremities without noticeable abnormality  PSYCH/NEURO: pleasant and cooperative, no obvious depression or anxiety, speech and thought processing grossly intact  ASSESSMENT AND PLAN: Viral illness, possibly due to the Covid virus. We will supply her with Zofran to use for nausea. She can use Imodium as needed for diarrhea. I advised her to be tested fot eh Covid virus asap.  Gershon Crane, MD  Discussed the following assessment and plan:  No diagnosis found.     I discussed the assessment and treatment plan with the patient. The patient was provided an opportunity to ask questions and all were answered. The patient agreed with the plan and demonstrated an understanding of the instructions.   The patient was advised to call back or seek an in-person evaluation if the symptoms worsen or if the condition fails to improve as  anticipated.     Review of Systems     Objective:   Physical Exam        Assessment & Plan:

## 2021-02-21 ENCOUNTER — Encounter: Payer: BC Managed Care – PPO | Admitting: Physical Therapy

## 2021-02-23 ENCOUNTER — Encounter: Payer: BC Managed Care – PPO | Admitting: Physical Therapy

## 2021-02-26 ENCOUNTER — Ambulatory Visit
Admission: RE | Admit: 2021-02-26 | Discharge: 2021-02-26 | Disposition: A | Discharge status: Home / Self Care | Payer: BC Managed Care – PPO | Source: Ambulatory Visit | Attending: Family Medicine | Admitting: Family Medicine

## 2021-02-26 DIAGNOSIS — M25511 Pain in right shoulder: Secondary | ICD-10-CM

## 2021-02-26 DIAGNOSIS — M542 Cervicalgia: Secondary | ICD-10-CM

## 2021-02-28 ENCOUNTER — Encounter: Payer: BC Managed Care – PPO | Admitting: Physical Therapy

## 2021-03-02 ENCOUNTER — Encounter: Payer: Self-pay | Admitting: Physical Therapy

## 2021-03-02 ENCOUNTER — Ambulatory Visit: Payer: BC Managed Care – PPO | Admitting: Physical Therapy

## 2021-03-02 ENCOUNTER — Other Ambulatory Visit: Payer: Self-pay

## 2021-03-02 DIAGNOSIS — R293 Abnormal posture: Secondary | ICD-10-CM

## 2021-03-02 DIAGNOSIS — M6281 Muscle weakness (generalized): Secondary | ICD-10-CM

## 2021-03-02 DIAGNOSIS — M25611 Stiffness of right shoulder, not elsewhere classified: Secondary | ICD-10-CM

## 2021-03-02 DIAGNOSIS — M25511 Pain in right shoulder: Secondary | ICD-10-CM

## 2021-03-02 NOTE — Therapy (Signed)
Patellofemoral pain syndrome 09/23/2015  . OSA (obstructive sleep apnea) 03/31/2014  . Asthma, chronic 08/14/2013  . Leg edema 08/14/2013  . Osteoarthritis 09/11/2010  . GERD 03/17/2010  . SCIATICA 08/30/2009  . TROCHANTERIC BURSITIS 08/09/2009  . LEG PAIN, BILATERAL 09/30/2007  . Essential hypertension 09/22/2007    Jule Ser, PT 03/05/2021, 8:19 PM  [facility] [address] [place], [place], [address] Phone: [phone]   Fax:  [phone]  Name: [name] MRN: [id number] Date of Birth: 1961/03/29  Patellofemoral pain syndrome 09/23/2015  . OSA (obstructive sleep apnea) 03/31/2014  . Asthma, chronic 08/14/2013  . Leg edema 08/14/2013  . Osteoarthritis 09/11/2010  . GERD 03/17/2010  . SCIATICA 08/30/2009  . TROCHANTERIC BURSITIS 08/09/2009  . LEG PAIN, BILATERAL 09/30/2007  . Essential hypertension 09/22/2007    Jule Ser, PT 03/05/2021, 8:19 PM  [facility] [address] [place], [place], [address] Phone: [phone]   Fax:  [phone]  Name: [name] MRN: [id number] Date of Birth: 1961/03/29  [facility] [address], [place], [place], [address] Phone: [phone]   Fax:  [phone]  Physical Therapy Treatment  Patient Details  Name: [name] MRN: [id number] Date of Birth: 07-11-1961 Referring Provider (PT): Hulan Saas, DO   Encounter Date: 03/02/2021   PT End of Session - 03/02/21 1544    Visit Number 6    Date for PT Re-Evaluation 03/07/21    Authorization Type [employer]    PT Start Time 1062    PT Stop Time 1610    PT Time Calculation (min) 39 min    Activity Tolerance Patient limited by pain    Behavior During Therapy [facility] for tasks assessed/performed           Past Medical History:  Diagnosis Date  . Allergy    Dr Caprice Red  . Cardiac arrhythmia   . Depression   . Diabetes mellitus without complication ([place])   . Fibroids    uterine sees Dr. Carlota Raspberry in [place]   . Genital warts   . Heart murmur   . High cholesterol   . Hypertension   . Irritable bowel syndrome (IBS)   . Low back pain    has a herniated lumbar disc sees Dr Marlou Sa    Past Surgical History:  Procedure Laterality Date  . ABDOMINOPLASTY  04/2013  . BREAST BIOPSY Right 06/2014  . COLONOSCOPY  02-10-15   per Dr. Hilarie Fredrickson, clear, repeat in 10 yrs   . LUMBAR LAMINECTOMY     L3-4 on 02-24-10 per Dr Louanne Skye   . MYOMECTOMY     laser ablation of uterine fibroids per Dr Ouida Sills 2000  . TUBAL LIGATION      There were no vitals filed for this visit.   Subjective Assessment - 03/02/21 1535    Subjective Pt states the MRI showed small tear and the MD feels that PT is the best thing for it.  Pt states she doesn't understand why the arm is getting limp and she feels like she has no control.Pt denies pain but after the exercises she feels like she knows she will have more pain and it lasts for a couple of days.    Diagnostic tests x-ray shows foraminal narrowing at C5-6 bilaterally and at C6-7 on the left; acromioclavicular  narrowing    Patient Stated Goals get rid of pain    Currently in Pain? No/denies                                       PT Short Term Goals - 01/24/21 1603      PT SHORT TERM GOAL #1   Title ind with initial HEP    Status Achieved      PT SHORT TERM GOAL #2   Title Rt shoulder flexion and abduction AROM to 100 for improved ability to reach to shelves    Baseline flexion only > 140 degrees    Status Partially Met      PT SHORT TERM GOAL #3   Title reports 30% less pain in Rt shoulder due to imrpoved strength    Time 4    Period Weeks    Status On-going             PT Long Term Goals - 03/05/21 2010      PT LONG TERM GOAL #1   Title ind with advanced

## 2021-03-07 ENCOUNTER — Encounter: Payer: Self-pay | Admitting: Physical Therapy

## 2021-03-07 ENCOUNTER — Ambulatory Visit: Payer: BC Managed Care – PPO | Admitting: Physical Therapy

## 2021-03-07 ENCOUNTER — Other Ambulatory Visit: Payer: Self-pay

## 2021-03-07 DIAGNOSIS — M25511 Pain in right shoulder: Secondary | ICD-10-CM

## 2021-03-07 DIAGNOSIS — M6281 Muscle weakness (generalized): Secondary | ICD-10-CM

## 2021-03-07 DIAGNOSIS — R293 Abnormal posture: Secondary | ICD-10-CM

## 2021-03-07 DIAGNOSIS — M25611 Stiffness of right shoulder, not elsewhere classified: Secondary | ICD-10-CM

## 2021-03-07 NOTE — Therapy (Signed)
[facility] [address], [address] [place], [place], [address] Phone: [phone]   Fax:  [phone]  Physical Therapy Treatment  Patient Details  Name: [name] MRN: [id number] Date of Birth: March 19, 1961 Referring Provider (PT): Antoine Primas, DO   Encounter Date: 03/07/2021   PT End of Session - 03/07/21 1549    Visit Number 7    Date for PT Re-Evaluation 04/27/21    Authorization Type BCBS    PT Start Time 1545   arrived late   PT Stop Time 1613    PT Time Calculation (min) 28 min    Activity Tolerance Patient tolerated treatment well    Behavior During Therapy [facility] for tasks assessed/performed           Past Medical History:  Diagnosis Date  . Allergy    Dr Stevphen Rochester  . Cardiac arrhythmia   . Depression   . Diabetes mellitus without complication (HCC)   . Fibroids    uterine sees Dr. Neva Seat in [place]   . Genital warts   . Heart murmur   . High cholesterol   . Hypertension   . Irritable bowel syndrome (IBS)   . Low back pain    has a herniated lumbar disc sees Dr August Saucer    Past Surgical History:  Procedure Laterality Date  . ABDOMINOPLASTY  04/2013  . BREAST BIOPSY Right 06/2014  . COLONOSCOPY  02-10-15   per Dr. Rhea Belton, clear, repeat in 10 yrs   . LUMBAR LAMINECTOMY     L3-4 on 02-24-10 per Dr Otelia Sergeant   . MYOMECTOMY     laser ablation of uterine fibroids per Dr Dareen Piano 2000  . TUBAL LIGATION      There were no vitals filed for this visit.   Subjective Assessment - 03/07/21 1543    Subjective Pt presented to clinic late and when checking in appears to be feeling better as she was using her Rt arm to open/closethe door and reach and hold things. She states the pain is waking me up again.  Pt denies pain currently.    Pertinent History foraminal narrowing at C5-6 bilaterally and at C6-7 on the left; acromioclavicular narrowing;    Diagnostic tests x-ray shows foraminal narrowing at C5-6  bilaterally and at C6-7 on the left; acromioclavicular narrowing    Patient Stated Goals get rid of pain    Currently in Pain? No/denies                             [facility] Adult PT Treatment/Exercise - 03/07/21 0001      Shoulder Exercises: Standing   Other Standing Exercises finger ladder to 22 and lift off for 5 sec; 10x    Other Standing Exercises standing at doorway flexion and lift off      Shoulder Exercises: Pulleys   Flexion 2 minutes    Flexion Limitations --   no pain with the moment     Ultrasound   Ultrasound Location Rt humeral head    Ultrasound Parameters 20%, 1.3, 3.3MHz    Ultrasound Goals Pain      Iontophoresis   Type of Iontophoresis Dexamethasone    Location lateral humeral head - Rt    Dose 1.0 mL #4    Time 4-6 hr                    PT Short Term Goals - 01/24/21  1603      PT SHORT TERM GOAL #1   Title ind with initial HEP    Status Achieved      PT SHORT TERM GOAL #2   Title Rt shoulder flexion and abduction AROM to 100 for improved ability to reach to shelves    Baseline flexion only > 140 degrees    Status Partially Met      PT SHORT TERM GOAL #3   Title reports 30% less pain in Rt shoulder due to imrpoved strength    Time 4    Period Weeks    Status On-going             PT Long Term Goals - 03/05/21 2010      PT LONG TERM GOAL #1   Title ind with advanced HEP    Time 8    Period Weeks    Status On-going    Target Date 04/27/21      PT LONG TERM GOAL #2   Title 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 with minimal discomfort in the shoulder or at least 75% improved    Time 8    Period Weeks    Status On-going    Target Date 04/27/21      PT LONG TERM GOAL #3   Title Rt shoulder flexion and abduction at least 150 deg AROM without pain in order to reach overhead    Time 8    Period Weeks    Status On-going    Target Date 04/27/21      PT LONG TERM GOAL #4   Title FOTO  to improve by 25 points    Time 8    Period Weeks    Status On-going    Target Date 04/27/21      PT LONG TERM GOAL #5   Title able to lift at least 5 lbs for lifting purse with her Rt arm    Time 8    Period Weeks    Status On-going    Target Date 04/27/21                 Plan - 03/07/21 1550    Clinical Impression Statement Pt was able to do finger ladder with lift off.  She reports fatigued a lot when lifting off, but after about 5 reps it started feeling better. pt states the [place] and patch seemed to help, so we did same parameters and treatment for pain management.  pt was given shoulder abduction isometric and flexion with lift off for exercise at home.  Pt will benefit from skilled PT to continue to progress strength and address pain as needed.    PT Treatment/Interventions Cryotherapy;ADLs/Self Care Home Management;Electrical Stimulation;Moist Heat;Traction;Therapeutic activities;Therapeutic exercise;Neuromuscular re-education;Patient/family education;Manual techniques;Taping;Dry needling;Passive range of motion;Iontophoresis 4mg /ml Dexamethasone    PT Next Visit Plan continue pain management as needed - [place], ionto; progress shoulder strength as tolerated gravity minimized, A/AROM to AROM    PT Home Exercise Plan Access Code: [email or web address]    Consulted and Agree with Plan of Care Patient           Patient will benefit from skilled therapeutic intervention in order to improve the following deficits and impairments:  Pain,Postural dysfunction,Impaired UE functional use,Increased fascial restricitons,Decreased strength,Decreased range of motion,Increased muscle spasms  Visit Diagnosis: Acute pain of right shoulder  Stiffness of right shoulder, not elsewhere classified  Muscle weakness (generalized)  Abnormal posture     Problem List  Patient Active Problem List   Diagnosis Date Noted  . Degenerative cervical disc 02/02/2021  . Rotator cuff syndrome of right shoulder  12/07/2020  . COVID-19 virus infection 09/20/2020  . Well woman exam with routine gynecological exam 01/07/2020  . Breast pain, right 01/07/2020  . Depression with anxiety 10/21/2019  . Hypercholesterolemia 01/19/2019  . Left lumbar radiculopathy 12/25/2018  . Right arm pain 11/26/2018  . Low back pain 11/26/2018  . Left knee pain 10/01/2018  . 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 Type 2 diabetes mellitus without complication, without long-term current use of insulin (HCC) 02/22/2017  . Loss of transverse plantar arch 10/31/2016  . Pronation deformity of ankle, acquired 10/31/2016  . Patellofemoral pain syndrome 09/23/2015  . OSA (obstructive sleep apnea) 03/31/2014  . Asthma, chronic 08/14/2013  . Leg edema 08/14/2013  . Osteoarthritis 09/11/2010  . GERD 03/17/2010  . SCIATICA 08/30/2009  . TROCHANTERIC BURSITIS 08/09/2009  . LEG PAIN, BILATERAL 09/30/2007  . Essential hypertension 09/22/2007    Junious Silk, PT 03/07/2021, 5:23 PM  [facility] [address], [address] [place], [place], [address] Phone: [phone]   Fax:  [phone]  Name: ESTERLENE SIMI MRN: [id number] Date of Birth: 10-20-1961

## 2021-03-15 ENCOUNTER — Ambulatory Visit: Payer: BC Managed Care – PPO | Admitting: Physical Therapy

## 2021-03-15 ENCOUNTER — Encounter: Payer: Self-pay | Admitting: Physical Therapy

## 2021-03-15 ENCOUNTER — Other Ambulatory Visit: Payer: Self-pay

## 2021-03-15 DIAGNOSIS — M6281 Muscle weakness (generalized): Secondary | ICD-10-CM

## 2021-03-15 DIAGNOSIS — M25511 Pain in right shoulder: Secondary | ICD-10-CM

## 2021-03-15 DIAGNOSIS — M25611 Stiffness of right shoulder, not elsewhere classified: Secondary | ICD-10-CM

## 2021-03-15 DIAGNOSIS — R293 Abnormal posture: Secondary | ICD-10-CM

## 2021-03-15 NOTE — Therapy (Signed)
motion,Increased muscle spasms  Visit Diagnosis: Acute pain of right shoulder  Stiffness of right shoulder,  not elsewhere classified  Muscle weakness (generalized)  Abnormal posture     Problem List Patient Active Problem List   Diagnosis Date Noted  . Degenerative cervical disc 02/02/2021  . Rotator cuff syndrome of right shoulder 12/07/2020  . COVID-19 virus infection 09/20/2020  . Well woman exam with routine gynecological exam 01/07/2020  . Breast pain, right 01/07/2020  . Depression with anxiety 10/21/2019  . Hypercholesterolemia 01/19/2019  . Left lumbar radiculopathy 12/25/2018  . Right arm pain 11/26/2018  . Low back pain 11/26/2018  . Left knee pain 10/01/2018  . 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 Type 2 diabetes mellitus without complication, without long-term current use of insulin ([place]) 02/22/2017  . Loss of transverse plantar arch 10/31/2016  . Pronation deformity of ankle, acquired 10/31/2016  . Patellofemoral pain syndrome 09/23/2015  . OSA (obstructive sleep apnea) 03/31/2014  . Asthma, chronic 08/14/2013  . Leg edema 08/14/2013  . Osteoarthritis 09/11/2010  . GERD 03/17/2010  . SCIATICA 08/30/2009  . TROCHANTERIC BURSITIS 08/09/2009  . LEG PAIN, BILATERAL 09/30/2007  . Essential hypertension 09/22/2007    Jule Ser, PT 03/15/2021, 5:25 PM  [facility] [address] [place], [place], [address] Phone: [phone]   Fax:  [phone]  Name: [name] MRN: [id number] Date of Birth: 08-29-1961  motion,Increased muscle spasms  Visit Diagnosis: Acute pain of right shoulder  Stiffness of right shoulder,  not elsewhere classified  Muscle weakness (generalized)  Abnormal posture     Problem List Patient Active Problem List   Diagnosis Date Noted  . Degenerative cervical disc 02/02/2021  . Rotator cuff syndrome of right shoulder 12/07/2020  . COVID-19 virus infection 09/20/2020  . Well woman exam with routine gynecological exam 01/07/2020  . Breast pain, right 01/07/2020  . Depression with anxiety 10/21/2019  . Hypercholesterolemia 01/19/2019  . Left lumbar radiculopathy 12/25/2018  . Right arm pain 11/26/2018  . Low back pain 11/26/2018  . Left knee pain 10/01/2018  . 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 Type 2 diabetes mellitus without complication, without long-term current use of insulin ([place]) 02/22/2017  . Loss of transverse plantar arch 10/31/2016  . Pronation deformity of ankle, acquired 10/31/2016  . Patellofemoral pain syndrome 09/23/2015  . OSA (obstructive sleep apnea) 03/31/2014  . Asthma, chronic 08/14/2013  . Leg edema 08/14/2013  . Osteoarthritis 09/11/2010  . GERD 03/17/2010  . SCIATICA 08/30/2009  . TROCHANTERIC BURSITIS 08/09/2009  . LEG PAIN, BILATERAL 09/30/2007  . Essential hypertension 09/22/2007    Jule Ser, PT 03/15/2021, 5:25 PM  [facility] [address] [place], [place], [address] Phone: [phone]   Fax:  [phone]  Name: [name] MRN: [id number] Date of Birth: 08-29-1961  [facility] [address] [place], [place], [address] Phone: [phone]   Fax:  [phone]  Physical Therapy Treatment  Patient Details  Name: [name] MRN: [id number] Date of Birth: 1961/01/29 Referring Provider (PT): Hulan Saas, DO   Encounter Date: 03/15/2021   PT End of Session - 03/15/21 1536    Visit Number 8    Date for PT Re-Evaluation 04/27/21    Authorization Type BCBS    PT Start Time 9485    PT Stop Time 1613    PT Time Calculation (min) 42 min    Activity Tolerance Patient tolerated treatment well    Behavior During Therapy [facility] for tasks assessed/performed           Past Medical History:  Diagnosis Date  . Allergy    Dr Caprice Red  . Cardiac arrhythmia   . Depression   . Diabetes mellitus without complication ([place])   . Fibroids    uterine sees Dr. Carlota Raspberry in [place]   . Genital warts   . Heart murmur   . High cholesterol   . Hypertension   . Irritable bowel syndrome (IBS)   . Low back pain    has a herniated lumbar disc sees Dr Marlou Sa    Past Surgical History:  Procedure Laterality Date  . ABDOMINOPLASTY  04/2013  . BREAST BIOPSY Right 06/2014  . COLONOSCOPY  02-10-15   per Dr. Hilarie Fredrickson, clear, repeat in 10 yrs   . LUMBAR LAMINECTOMY     L3-4 on 02-24-10 per Dr Louanne Skye   . MYOMECTOMY     laser ablation of uterine fibroids per Dr Ouida Sills 2000  . TUBAL LIGATION      There were no vitals filed for this visit.   Subjective Assessment - 03/15/21 1534    Subjective Pt denies pain today.  It still wakes me sometimes at night.  i did my regular workout and no pain    Pertinent History foraminal narrowing at C5-6 bilaterally and at C6-7 on the left; acromioclavicular narrowing;    Diagnostic tests x-ray shows foraminal narrowing at C5-6 bilaterally and at C6-7 on the left; acromioclavicular narrowing    Patient Stated Goals get rid of pain    Currently in Pain? No/denies                              [facility] Adult PT Treatment/Exercise - 03/15/21 0001      Shoulder Exercises: Standing   Other Standing Exercises finger ladder to 22 and lift off for 5 sec; 10x      Shoulder Exercises: Pulleys   Flexion 2 minutes      Shoulder Exercises: ROM/Strengthening   Other ROM/Strengthening Exercises yellow band elbow ext, sholder IR/ER - 20x    Other ROM/Strengthening Exercises sidelying Rt shoulder flexion; supine press up with cane - 20x      Ultrasound   Ultrasound Location Rt humeral head    Ultrasound Parameters 20%, 3.3 MHz, 1.3 intensity    Ultrasound Goals Pain      Iontophoresis   Type of Iontophoresis Dexamethasone    Location lateral humeral head - Rt    Dose 1.0 mL #5    Time 4-6 hr                  PT Education - 03/15/21 1655    Education Details Access Code: [email or web address]    Person(s)

## 2021-03-22 ENCOUNTER — Ambulatory Visit: Payer: BC Managed Care – PPO | Attending: Family Medicine | Admitting: Physical Therapy

## 2021-03-22 ENCOUNTER — Encounter: Payer: Self-pay | Admitting: Physical Therapy

## 2021-03-22 ENCOUNTER — Other Ambulatory Visit: Payer: Self-pay

## 2021-03-22 DIAGNOSIS — M25611 Stiffness of right shoulder, not elsewhere classified: Secondary | ICD-10-CM | POA: Diagnosis present

## 2021-03-22 DIAGNOSIS — R293 Abnormal posture: Secondary | ICD-10-CM | POA: Insufficient documentation

## 2021-03-22 DIAGNOSIS — M25511 Pain in right shoulder: Secondary | ICD-10-CM | POA: Diagnosis present

## 2021-03-22 DIAGNOSIS — M6281 Muscle weakness (generalized): Secondary | ICD-10-CM | POA: Diagnosis present

## 2021-03-23 NOTE — Therapy (Signed)
[facility] [address] [place], [place], [address] Phone: [phone]   Fax:  [phone]  Physical Therapy Treatment  Patient Details  Name: [name] MRN: [id number] Date of Birth: 01-18-1961 Referring Provider (PT): Antoine Primas, DO   Encounter Date: 03/22/2021   PT End of Session - 03/23/21 0802    Visit Number 9    Date for PT Re-Evaluation 04/27/21    Authorization Type BCBS    PT Start Time 1533    PT Stop Time 1613    PT Time Calculation (min) 40 min    Activity Tolerance Patient tolerated treatment well    Behavior During Therapy [facility] for tasks assessed/performed           Past Medical History:  Diagnosis Date  . Allergy    Dr Stevphen Rochester  . Cardiac arrhythmia   . Depression   . Diabetes mellitus without complication (HCC)   . Fibroids    uterine sees Dr. Neva Seat in [place]   . Genital warts   . Heart murmur   . High cholesterol   . Hypertension   . Irritable bowel syndrome (IBS)   . Low back pain    has a herniated lumbar disc sees Dr August Saucer    Past Surgical History:  Procedure Laterality Date  . ABDOMINOPLASTY  04/2013  . BREAST BIOPSY Right 06/2014  . COLONOSCOPY  02-10-15   per Dr. Rhea Belton, clear, repeat in 10 yrs   . LUMBAR LAMINECTOMY     L3-4 on 02-24-10 per Dr Otelia Sergeant   . MYOMECTOMY     laser ablation of uterine fibroids per Dr Dareen Piano 2000  . TUBAL LIGATION      There were no vitals filed for this visit.   Subjective Assessment - 03/23/21 0943    Subjective Pt states she is feeling much better.  She only had pain one time on the weekend that came out of nowhere.  Pt has been doing exericses regularly at home.    Currently in Pain? No/denies              [facility] PT Assessment - 03/23/21 0001      Assessment   Medical Diagnosis Rt rotator cuff tear      AROM   Right Shoulder Flexion 150 Degrees    Right Shoulder ABduction 130 Degrees    Left Shoulder Flexion  167 Degrees                         [facility] Adult PT Treatment/Exercise - 03/23/21 0001      Shoulder Exercises: Supine   Other Supine Exercises press up with 2lb on cane - 15x      Shoulder Exercises: Prone   Extension Strengthening;Right;20 reps    Horizontal ABduction 1 Strengthening;20 reps    Other Prone Exercises row 20x      Shoulder Exercises: Standing   Extension Strengthening;Right;10 reps    Theraband Level (Shoulder Extension) Level 3 (Green)    Row [employer];Theraband    Theraband Level (Shoulder Row) Level 3 (Green)      Ultrasound   Ultrasound Location Rt humeral head    Ultrasound Parameters 20%, 3.3MHz, 1.3    Ultrasound Goals Pain      Iontophoresis   Type of Iontophoresis Dexamethasone    Location lateral humeral head - Rt    Dose 1.0 mL #6    Time 4-6 hr  PT Short Term Goals - 01/24/21 1603      PT SHORT TERM GOAL #1   Title ind with initial HEP    Status Achieved      PT SHORT TERM GOAL #2   Title Rt shoulder flexion and abduction AROM to 100 for improved ability to reach to shelves    Baseline flexion only > 140 degrees    Status Partially Met      PT SHORT TERM GOAL #3   Title reports 30% less pain in Rt shoulder due to imrpoved strength    Time 4    Period Weeks    Status On-going             PT Long Term Goals - 03/05/21 2010      PT LONG TERM GOAL #1   Title ind with advanced HEP    Time 8    Period Weeks    Status On-going    Target Date 04/27/21      PT LONG TERM GOAL #2   Title 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 with minimal discomfort in the shoulder or at least 75% improved    Time 8    Period Weeks    Status On-going    Target Date 04/27/21      PT LONG TERM GOAL #3   Title Rt shoulder flexion and abduction at least 150 deg AROM without pain in order to reach overhead    Time 8    Period Weeks    Status On-going    Target Date  04/27/21      PT LONG TERM GOAL #4   Title FOTO to improve by 25 points    Time 8    Period Weeks    Status On-going    Target Date 04/27/21      PT LONG TERM GOAL #5   Title able to lift at least 5 lbs for lifting purse with her Rt arm    Time 8    Period Weeks    Status On-going    Target Date 04/27/21                 Plan - 03/23/21 1000    Clinical Impression Statement Pt has made improvements with pain management and increased ROM as shown above. Pt was able to progress exercises today.  Pt did ionto #6 today.  She is likely able to progress to home with HEP as long as no setbacks in the next 1-2 visits    PT Treatment/Interventions Cryotherapy;ADLs/Self Care Home Management;Electrical Stimulation;Moist Heat;Traction;Therapeutic activities;Therapeutic exercise;Neuromuscular re-education;Patient/family education;Manual techniques;Taping;Dry needling;Passive range of motion;Iontophoresis 4mg /ml Dexamethasone    PT Next Visit Plan continue pain management as needed - [place] at the end of treatment; progress shoulder strength as tolerated , f/u on additional HEP    PT Home Exercise Plan Access Code: [email or web address]    Consulted and Agree with Plan of Care Patient           Patient will benefit from skilled therapeutic intervention in order to improve the following deficits and impairments:  Pain,Postural dysfunction,Impaired UE functional use,Increased fascial restricitons,Decreased strength,Decreased range of motion,Increased muscle spasms  Visit Diagnosis: Acute pain of right shoulder  Stiffness of right shoulder, not elsewhere classified  Muscle weakness (generalized)  Abnormal posture     Problem List Patient Active Problem List   Diagnosis Date Noted  . Degenerative cervical disc 02/02/2021  . Rotator cuff syndrome of right shoulder  12/07/2020  . COVID-19 virus infection 09/20/2020  . Well woman exam with routine gynecological exam 01/07/2020  . Breast pain, right  01/07/2020  . Depression with anxiety 10/21/2019  . Hypercholesterolemia 01/19/2019  . Left lumbar radiculopathy 12/25/2018  . Right arm pain 11/26/2018  . Low back pain 11/26/2018  . Left knee pain 10/01/2018  . 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 Type 2 diabetes mellitus without complication, without long-term current use of insulin (HCC) 02/22/2017  . Loss of transverse plantar arch 10/31/2016  . Pronation deformity of ankle, acquired 10/31/2016  . Patellofemoral pain syndrome 09/23/2015  . OSA (obstructive sleep apnea) 03/31/2014  . Asthma, chronic 08/14/2013  . Leg edema 08/14/2013  . Osteoarthritis 09/11/2010  . GERD 03/17/2010  . SCIATICA 08/30/2009  . TROCHANTERIC BURSITIS 08/09/2009  . LEG PAIN, BILATERAL 09/30/2007  . Essential hypertension 09/22/2007    Junious Silk, PT 03/23/2021, 10:09 AM  [facility] [address], [address] [place], [place], [address] Phone: [phone]   Fax:  [phone]  Name: MYAUNA CULLINAN MRN: [id number] Date of Birth: [birth year]

## 2021-03-28 ENCOUNTER — Ambulatory Visit: Payer: BC Managed Care – PPO | Admitting: Physical Therapy

## 2021-04-10 ENCOUNTER — Other Ambulatory Visit: Payer: Self-pay | Admitting: Family Medicine

## 2021-04-11 ENCOUNTER — Telehealth: Payer: Self-pay | Admitting: Physical Therapy

## 2021-04-11 ENCOUNTER — Ambulatory Visit: Payer: BC Managed Care – PPO | Admitting: Physical Therapy

## 2021-04-11 NOTE — Telephone Encounter (Signed)
Pt cancelled appt since out of town.  Called back and left voicemail regarding next appt on 5/5 and to call if feels ready for discharge.

## 2021-04-20 ENCOUNTER — Encounter: Payer: Self-pay | Admitting: Physical Therapy

## 2021-04-20 ENCOUNTER — Other Ambulatory Visit: Payer: Self-pay

## 2021-04-20 ENCOUNTER — Ambulatory Visit: Payer: BC Managed Care – PPO | Attending: Family Medicine | Admitting: Physical Therapy

## 2021-04-20 DIAGNOSIS — M25511 Pain in right shoulder: Secondary | ICD-10-CM

## 2021-04-20 DIAGNOSIS — R293 Abnormal posture: Secondary | ICD-10-CM | POA: Diagnosis present

## 2021-04-20 DIAGNOSIS — M25611 Stiffness of right shoulder, not elsewhere classified: Secondary | ICD-10-CM

## 2021-04-20 DIAGNOSIS — M6281 Muscle weakness (generalized): Secondary | ICD-10-CM

## 2021-04-20 NOTE — Therapy (Signed)
[facility] [address], [place], [place], [address] Phone: [phone]   Fax:  [phone]  Physical Therapy Treatment  Patient Details  Name: [name] MRN: [id number] Date of Birth: [birth year] Referring Provider (PT): Hulan Saas, DO   Encounter Date: 04/20/2021   PT End of Session - 04/20/21 1529    Visit Number 10    Date for PT Re-Evaluation 04/27/21    Authorization Type BCBS    PT Start Time 1529    PT Stop Time 1608    PT Time Calculation (min) 39 min    Activity Tolerance Patient tolerated treatment well    Behavior During Therapy [facility] for tasks assessed/performed           Past Medical History:  Diagnosis Date  . Allergy    Dr Caprice Red  . Cardiac arrhythmia   . Depression   . Diabetes mellitus without complication ([place])   . Fibroids    uterine sees Dr. Carlota Raspberry in [place]   . Genital warts   . Heart murmur   . High cholesterol   . Hypertension   . Irritable bowel syndrome (IBS)   . Low back pain    has a herniated lumbar disc sees Dr Marlou Sa    Past Surgical History:  Procedure Laterality Date  . ABDOMINOPLASTY  04/2013  . BREAST BIOPSY Right 06/2014  . COLONOSCOPY  02-10-15   per Dr. Hilarie Fredrickson, clear, repeat in 10 yrs   . LUMBAR LAMINECTOMY     L3-4 on 02-24-10 per Dr Louanne Skye   . MYOMECTOMY     laser ablation of uterine fibroids per Dr Ouida Sills 2000  . TUBAL LIGATION      There were no vitals filed for this visit.   Subjective Assessment - 04/20/21 1535    Subjective Pt states she forgot about the shoulder because she hasn't had any pain.  Pt states the only time she noticed pain was this Monday when she woke up and everything was stiff and didn't know why.    Patient Stated Goals get rid of pain    Currently in Pain? No/denies                             [facility] Adult PT Treatment/Exercise - 04/20/21 0001      Shoulder Exercises: Standing   Horizontal  ABduction Strengthening;20 reps;Theraband    Flexion Strengthening;Both;20 reps    Shoulder Flexion Weight (lbs) 2    ABduction Strengthening;Both;20 reps    Shoulder ABduction Weight (lbs) 2    Extension Strengthening;Both;20 reps    Extension Weight (lbs) 3    Row Strengthening;20 reps;Theraband    Diagonals Limitations pwer tower 1 plate bil UE up and down chops; red band D2 bilateral      Shoulder Exercises: ROM/Strengthening   UBE (Upper Arm Bike) 3/3 L1 - fwd/back - PT present for status update                  PT Education - 04/20/21 1609    Education Details Access Code: [email or web address]    Person(s) Educated Patient    Methods Explanation;Demonstration;Tactile cues;Verbal cues;Handout    Comprehension Verbalized understanding;Returned demonstration            PT Short Term Goals - 01/24/21 1603      PT SHORT TERM GOAL #1   Title ind with initial HEP    Status Achieved  [facility] [address], [place], [place], [address] Phone: [phone]   Fax:  [phone]  Physical Therapy Treatment  Patient Details  Name: [name] MRN: [id number] Date of Birth: [birth year] Referring Provider (PT): Hulan Saas, DO   Encounter Date: 04/20/2021   PT End of Session - 04/20/21 1529    Visit Number 10    Date for PT Re-Evaluation 04/27/21    Authorization Type BCBS    PT Start Time 1529    PT Stop Time 1608    PT Time Calculation (min) 39 min    Activity Tolerance Patient tolerated treatment well    Behavior During Therapy [facility] for tasks assessed/performed           Past Medical History:  Diagnosis Date  . Allergy    Dr Caprice Red  . Cardiac arrhythmia   . Depression   . Diabetes mellitus without complication ([place])   . Fibroids    uterine sees Dr. Carlota Raspberry in [place]   . Genital warts   . Heart murmur   . High cholesterol   . Hypertension   . Irritable bowel syndrome (IBS)   . Low back pain    has a herniated lumbar disc sees Dr Marlou Sa    Past Surgical History:  Procedure Laterality Date  . ABDOMINOPLASTY  04/2013  . BREAST BIOPSY Right 06/2014  . COLONOSCOPY  02-10-15   per Dr. Hilarie Fredrickson, clear, repeat in 10 yrs   . LUMBAR LAMINECTOMY     L3-4 on 02-24-10 per Dr Louanne Skye   . MYOMECTOMY     laser ablation of uterine fibroids per Dr Ouida Sills 2000  . TUBAL LIGATION      There were no vitals filed for this visit.   Subjective Assessment - 04/20/21 1535    Subjective Pt states she forgot about the shoulder because she hasn't had any pain.  Pt states the only time she noticed pain was this Monday when she woke up and everything was stiff and didn't know why.    Patient Stated Goals get rid of pain    Currently in Pain? No/denies                             [facility] Adult PT Treatment/Exercise - 04/20/21 0001      Shoulder Exercises: Standing   Horizontal  ABduction Strengthening;20 reps;Theraband    Flexion Strengthening;Both;20 reps    Shoulder Flexion Weight (lbs) 2    ABduction Strengthening;Both;20 reps    Shoulder ABduction Weight (lbs) 2    Extension Strengthening;Both;20 reps    Extension Weight (lbs) 3    Row Strengthening;20 reps;Theraband    Diagonals Limitations pwer tower 1 plate bil UE up and down chops; red band D2 bilateral      Shoulder Exercises: ROM/Strengthening   UBE (Upper Arm Bike) 3/3 L1 - fwd/back - PT present for status update                  PT Education - 04/20/21 1609    Education Details Access Code: [email or web address]    Person(s) Educated Patient    Methods Explanation;Demonstration;Tactile cues;Verbal cues;Handout    Comprehension Verbalized understanding;Returned demonstration            PT Short Term Goals - 01/24/21 1603      PT SHORT TERM GOAL #1   Title ind with initial HEP    Status Achieved  Essential hypertension 09/22/2007    Jule Ser, PT 04/20/2021, 5:15 PM  [facility] [address] [place], [place], [address] Phone: [phone]   Fax:  [phone]  Name: [name] MRN: [id number] Date of Birth: 08/31/1961  PHYSICAL THERAPY DISCHARGE SUMMARY  Visits from Start of Care: 10  Current functional level related to goals / functional outcomes: See above goals   Remaining deficits: seeabove   Education / Equipment: HEP Plan: Patient agrees to discharge.  Patient goals were met. Patient is being discharged due to meeting the stated rehab goals.  ?????    [employer], PT 04/20/21 5:15 PM

## 2021-04-20 NOTE — Patient Instructions (Signed)
Access Code: [email or web address] URL: [email or web address] Date: 04/20/2021 Prepared by: Jari Favre  Exercises Flexion-Extension Shoulder Pendulum with Table Support - 1 x daily - 7 x weekly - 3 sets - 10 reps Seated Shoulder Flexion Slide at Table Top with Forearm in Neutral - 3 x daily - 7 x weekly - 1 sets - 10 reps - 5 sec hold Seated Shoulder External Rotation PROM on Table - 1 x daily - 7 x weekly - 3 sets - 10 reps Seated Scapular Retraction - 3 x daily - 7 x weekly - 10 reps - 1 sets - 5 sec hold Isometric Shoulder Flexion at Wall - 1 x daily - 7 x weekly - 3 sets - 10 reps Isometric Shoulder Abduction - Arm Straight at Wall - 1 x daily - 7 x weekly - 1 sets - 5 reps - 5 hold Isometric Shoulder Extension at Wall - 1 x daily - 7 x weekly - 3 sets - 10 reps Standing Isometric Shoulder Internal Rotation at Doorway - 1 x daily - 7 x weekly - 1 sets - 5 reps - 5 hold Seated Shoulder Flexion AAROM with Pulley Behind - 3 x daily - 7 x weekly - 1 sets - 10 reps Standing Bicep Curls with Resistance - 1 x daily - 7 x weekly - 3 sets - 10 reps Standing Tricep Extensions with Resistance - 1 x daily - 7 x weekly - 3 sets - 10 reps Shoulder External Rotation with Anchored Resistance - 1 x daily - 7 x weekly - 3 sets - 10 reps Standing Shoulder Internal Rotation with Anchored Resistance - 1 x daily - 7 x weekly - 3 sets - 10 reps Supine Shoulder Press AAROM in Abduction with Dowel - 1 x daily - 7 x weekly - 3 sets - 10 reps Sidelying Shoulder Flexion 15 Degrees - 1 x daily - 7 x weekly - 3 sets - 10 reps Standing Shoulder Diagonal Horizontal Abduction 60/120 Degrees with Resistance - 1 x daily - 7 x weekly - 3 sets - 10 reps Standing Diagonal Lift with Anchored Resistance - 1 x daily - 7 x weekly - 3 sets - 10 reps Single Arm Bent Over Shoulder Extension with Dumbbell - 1 x daily - 7 x weekly - 3 sets - 10 reps Bent Over Single Arm Shoulder Row with Dumbbell - 1 x daily - 7 x weekly -  3 sets - 10 reps Shoulder Abduction with Dumbbells - Thumbs Up - 1 x daily - 7 x weekly - 3 sets - 10 reps Standing Shoulder Horizontal Abduction with Resistance - 1 x daily - 7 x weekly - 3 sets - 10 reps

## 2021-04-25 ENCOUNTER — Encounter: Payer: BC Managed Care – PPO | Admitting: Physical Therapy

## 2021-04-27 ENCOUNTER — Encounter: Payer: Self-pay | Admitting: Family Medicine

## 2021-04-28 NOTE — Telephone Encounter (Signed)
Noted. We will keep an eye on this

## 2021-05-01 ENCOUNTER — Other Ambulatory Visit: Payer: Self-pay

## 2021-05-02 ENCOUNTER — Ambulatory Visit (INDEPENDENT_AMBULATORY_CARE_PROVIDER_SITE_OTHER): Payer: BC Managed Care – PPO | Admitting: Family Medicine

## 2021-05-02 ENCOUNTER — Encounter: Payer: Self-pay | Admitting: Family Medicine

## 2021-05-02 VITALS — BP 138/86 | HR 84 | Temp 98.2°F | Ht 63.0 in | Wt 180.0 lb

## 2021-05-02 DIAGNOSIS — Z0000 Encounter for general adult medical examination without abnormal findings: Secondary | ICD-10-CM

## 2021-05-02 LAB — CBC WITH DIFFERENTIAL/PLATELET
Basophils Absolute: 0.1 10*3/uL (ref 0.0–0.1)
Basophils Relative: 0.6 % (ref 0.0–3.0)
Eosinophils Absolute: 0.3 10*3/uL (ref 0.0–0.7)
Eosinophils Relative: 3.8 % (ref 0.0–5.0)
HCT: 41.4 % (ref 36.0–46.0)
Hemoglobin: 14.1 g/dL (ref 12.0–15.0)
Lymphocytes Relative: 34.7 % (ref 12.0–46.0)
Lymphs Abs: 3 10*3/uL (ref 0.7–4.0)
MCHC: 34 g/dL (ref 30.0–36.0)
MCV: 87.3 fl (ref 78.0–100.0)
Monocytes Absolute: 0.6 10*3/uL (ref 0.1–1.0)
Monocytes Relative: 6.7 % (ref 3.0–12.0)
Neutro Abs: 4.7 10*3/uL (ref 1.4–7.7)
Neutrophils Relative %: 54.2 % (ref 43.0–77.0)
Platelets: 361 10*3/uL (ref 150.0–400.0)
RBC: 4.74 Mil/uL (ref 3.87–5.11)
RDW: 14.5 % (ref 11.5–15.5)
WBC: 8.7 10*3/uL (ref 4.0–10.5)

## 2021-05-02 LAB — URINALYSIS, ROUTINE W REFLEX MICROSCOPIC
Bilirubin Urine: NEGATIVE
Ketones, ur: NEGATIVE
Leukocytes,Ua: NEGATIVE
Nitrite: NEGATIVE
Specific Gravity, Urine: 1.015 (ref 1.000–1.030)
Total Protein, Urine: NEGATIVE
Urine Glucose: NEGATIVE
Urobilinogen, UA: 0.2 (ref 0.0–1.0)
pH: 6.5 (ref 5.0–8.0)

## 2021-05-02 LAB — LIPID PANEL
Cholesterol: 139 mg/dL (ref 0–200)
HDL: 42.5 mg/dL (ref >39.00)
LDL Cholesterol: 65 mg/dL (ref 0–99)
NonHDL: 96.18
Total CHOL/HDL Ratio: 3
Triglycerides: 156 mg/dL — ABNORMAL HIGH (ref 0.0–149.0)
VLDL: 31.2 mg/dL (ref 0.0–40.0)

## 2021-05-02 LAB — TSH: TSH: 2.28 u[IU]/mL (ref 0.35–4.50)

## 2021-05-02 LAB — BASIC METABOLIC PANEL
BUN: 10 mg/dL (ref 6–23)
CO2: 29 mEq/L (ref 19–32)
Calcium: 9.7 mg/dL (ref 8.4–10.5)
Chloride: 105 mEq/L (ref 96–112)
Creatinine, Ser: 0.71 mg/dL (ref 0.40–1.20)
GFR: 93.84 mL/min (ref >60.00)
Glucose, Bld: 161 mg/dL — ABNORMAL HIGH (ref 70–99)
Potassium: 4.2 mEq/L (ref 3.5–5.1)
Sodium: 142 mEq/L (ref 135–145)

## 2021-05-02 LAB — HEPATIC FUNCTION PANEL
ALT: 20 U/L (ref 0–35)
AST: 15 U/L (ref 0–37)
Albumin: 4.8 g/dL (ref 3.5–5.2)
Alkaline Phosphatase: 145 U/L — ABNORMAL HIGH (ref 39–117)
Bilirubin, Direct: 0.1 mg/dL (ref 0.0–0.3)
Total Bilirubin: 0.5 mg/dL (ref 0.2–1.2)
Total Protein: 7 g/dL (ref 6.0–8.3)

## 2021-05-02 LAB — MAGNESIUM: Magnesium: 1.9 mg/dL (ref 1.5–2.5)

## 2021-05-02 LAB — T4, FREE: Free T4: 0.65 ng/dL (ref 0.60–1.60)

## 2021-05-02 LAB — HEMOGLOBIN A1C: Hgb A1c MFr Bld: 6.5 % (ref 4.6–6.5)

## 2021-05-02 LAB — T3, FREE: T3, Free: 3.6 pg/mL (ref 2.3–4.2)

## 2021-05-02 MED ORDER — GLYBURIDE 5 MG PO TABS: 5.0000 mg | ORAL_TABLET | Freq: Every day | ORAL | 3 refills | Status: DC

## 2021-05-02 MED ORDER — ATORVASTATIN CALCIUM 20 MG PO TABS: 20.0000 mg | ORAL_TABLET | Freq: Every day | ORAL | 3 refills | Status: DC

## 2021-05-02 MED ORDER — AMLODIPINE BESYLATE 10 MG PO TABS: 10.0000 mg | ORAL_TABLET | Freq: Every day | ORAL | 3 refills | Status: DC

## 2021-05-02 NOTE — Progress Notes (Signed)
Subjective:    Patient ID: [name], female    DOB: 02-Feb-1961, 60 y.o.   MRN: [id number]  HPI Here for a well exam. She feels fine. Her am fasting glucoses average in the 90's.    Review of Systems  Constitutional: Negative.   HENT: Negative.   Eyes: Negative.   Respiratory: Negative.   Cardiovascular: Negative.   Gastrointestinal: Negative.   Genitourinary: Negative for decreased urine volume, difficulty urinating, dyspareunia, dysuria, enuresis, flank pain, frequency, hematuria, pelvic pain and urgency.  Musculoskeletal: Negative.   Skin: Negative.   Neurological: Negative.   Psychiatric/Behavioral: Negative.        Objective:   Physical Exam Constitutional:      General: She is not in acute distress.    Appearance: Normal appearance. She is well-developed.  HENT:     Head: Normocephalic and atraumatic.     Right Ear: External ear normal.     Left Ear: External ear normal.     Nose: Nose normal.     Mouth/Throat:     Pharynx: No oropharyngeal exudate.  Eyes:     General: No scleral icterus.    Conjunctiva/sclera: Conjunctivae normal.     Pupils: Pupils are equal, round, and reactive to light.  Neck:     Thyroid: No thyromegaly.     Vascular: No JVD.  Cardiovascular:     Rate and Rhythm: Normal rate and regular rhythm.     Heart sounds: Normal heart sounds. No murmur heard. No friction rub. No gallop.   Pulmonary:     Effort: Pulmonary effort is normal. No respiratory distress.     Breath sounds: Normal breath sounds. No wheezing or rales.  Chest:     Chest wall: No tenderness.  Abdominal:     General: Bowel sounds are normal. There is no distension.     Palpations: Abdomen is soft. There is no mass.     Tenderness: There is no abdominal tenderness. There is no guarding or rebound.  Musculoskeletal:        General: No tenderness. Normal range of motion.     Cervical back: Normal range of motion and neck supple.  Lymphadenopathy:     Cervical: No  cervical adenopathy.  Skin:    General: Skin is warm and dry.     Findings: No erythema or rash.  Neurological:     Mental Status: She is alert and oriented to person, place, and time.     Cranial Nerves: No cranial nerve deficit.     Motor: No abnormal muscle tone.     Coordination: Coordination normal.     Deep Tendon Reflexes: Reflexes are normal and symmetric. Reflexes normal.  Psychiatric:        Behavior: Behavior normal.        Thought Content: Thought content normal.        Judgment: Judgment normal.           Assessment & Plan:  Well exam. We discussed diet and exercise. Get fasting labs. Gershon Crane, MD

## 2021-06-08 ENCOUNTER — Telehealth: Payer: Self-pay | Admitting: Family Medicine

## 2021-06-08 NOTE — Telephone Encounter (Signed)
Scheduled patient to see Dr. Tamala Julian on Tuesday at 10:45am. Recommended rest and ice over weekend and to go into emergency room if symptoms become unmanageable.

## 2021-06-08 NOTE — Telephone Encounter (Signed)
Pt having trouble with L leg/hip pain again. We previously ordered an epidural but she did not have it done as she felt better. Pt would like this ordered again, advised she may need a visit.

## 2021-06-12 NOTE — Progress Notes (Signed)
[name] [place] Phone: [phone] Subjective:   Rito Ehrlich, am serving as a scribe for Dr. Hulan Saas.  I'm seeing this patient by the request  of:  Laurey Morale, MD  CC:   [id number]  02/02/2021 Likely still secondary to the shoulder.  Patient declined injection.  Patient has failed all other conservative therapy at this time including formal physical therapy.  I do believe that MRI is necessary.  We will avoid MR arthrogram though secondary to patient having an allergy to dye.  We will get MRI of the neck as well secondary to cervical radiculopathy that is noted today that could be within the differential.  Patient would be a candidate for injections in either spot or potential surgical intervention if necessary.  Patient is in agreement with this plan and will follow up after imaging.  Update 06/13/2021 Carletta E Demuro is a 60 y.o. female coming in with complaint of Left Leg and hip pain. Patient states that she was doing pretty well and the pain is not as bad as it was before but since starting working out she has noticed the pain coming back. Wanting to discus possible epidural injections that she did not get last time.   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 No impingement to explain right shoulder symptoms.  MRI R shoulder 02/2021 IMPRESSION: 1. Mild tendinosis of the supraspinatus tendon with a small partial-thickness bursal surface tear anteriorly and a tiny insertional interstitial tear. 2. Mild tendinosis of the infraspinatus tendon with subcortical reactive marrow changes.     Past Medical History:  Diagnosis Date   Allergy    Dr Caprice Red   Cardiac arrhythmia    Depression    Diabetes mellitus without complication [facility])    Fibroids    uterine sees Dr. Carlota Raspberry in [place]    Genital warts    Heart murmur     High cholesterol    Hypertension    Irritable bowel syndrome (IBS)    Low back pain    has a herniated lumbar disc sees Dr Marlou Sa   Past Surgical History:  Procedure Laterality Date   ABDOMINOPLASTY  04/2013   BREAST BIOPSY Right 06/2014   COLONOSCOPY  02-10-15   per Dr. Hilarie Fredrickson, clear, repeat in 10 yrs    LUMBAR LAMINECTOMY     L3-4 on 02-24-10 per Dr Louanne Skye    MYOMECTOMY     laser ablation of uterine fibroids per Dr Ouida Sills 2000   TUBAL LIGATION     Social History   Socioeconomic History   Marital status: Married    Spouse name: Not on file   Number of children: 4   Years of education: Not on file   Highest education level: Some college, no degree  Occupational History   Occupation: Pharmacist, hospital  Tobacco Use   Smoking status: Former    Packs/day: 0.50    Years: 1.00    Pack years: 0.50    Types: Cigarettes    Quit date: 12/17/1996    Years since quitting: 24.5   Smokeless tobacco: Never  Vaping Use   Vaping Use: Never used  Substance and Sexual Activity   Alcohol use: Yes    Alcohol/week: 0.0 standard drinks    Comment: occ   Drug use: No   Sexual activity: Yes    Birth control/protection: Surgical  Other Topics Concern   Not  on file  Social History Narrative   Not on file   Social Determinants of Health   Financial Resource Strain: Not on file  Food Insecurity: Not on file  Transportation Needs: Not on file  Physical Activity: Not on file  Stress: Not on file  Social Connections: Not on file   Allergies  Allergen Reactions   Iodinated Diagnostic Agents Swelling    Tongue swelling   Iodine Swelling    Tongue swelling   Peanut-Containing Drug Products Anaphylaxis   Hctz [Hydrochlorothiazide] Other (See Comments)    Blood pressure goes up   Shellfish-Derived Products Other (See Comments)     Per allergy test   Cephalexin Rash   Ciprofloxacin Diarrhea   Family History  Problem Relation Age of Onset   Arthritis Other        Family Hx   Diabetes Other         Family Hx 1st degree relative   Hyperlipidemia Other        Family Hx   Hypertension Other        Family Hx   Prostate cancer Other        Family Hx 1st Degree relative <50   Stroke Other        First Degree Female <50   Diabetes Mother    Hypertension Mother    Diabetes Father    Hypertension Father    Heart disease Father    Diabetes Maternal Grandmother    Hypertension Maternal Grandmother    Diabetes Maternal Grandfather    Hypertension Maternal Grandfather    Diabetes Paternal Grandmother    Hypertension Paternal Grandmother    Diabetes Paternal Grandfather    Hypertension Paternal Grandfather    Colon cancer Neg Hx    Breast cancer Neg Hx     Current Outpatient Medications (Endocrine & Metabolic):    glyBURIDE (DIABETA) 5 MG tablet, Take 1 tablet (5 mg total) by mouth daily.   metFORMIN (GLUCOPHAGE) 500 MG tablet, TAKE ONE TABLET BY MOUTH TWICE A DAY WITH A MEAL  Current Outpatient Medications (Cardiovascular):    amLODipine (NORVASC) 10 MG tablet, Take 1 tablet (10 mg total) by mouth daily.   atorvastatin (LIPITOR) 20 MG tablet, Take 1 tablet (20 mg total) by mouth daily.   lisinopril (ZESTRIL) 20 MG tablet, TAKE ONE TABLET BY MOUTH TWICE A DAY  Current Outpatient Medications (Respiratory):    albuterol (VENTOLIN HFA) 108 (90 Base) MCG/ACT inhaler, Inhale 2 puffs into the lungs every 4 (four) hours as needed for wheezing or shortness of breath.    Current Outpatient Medications (Other):    blood glucose meter kit and supplies, Dispense based on patient and insurance preference. Test once a day. E11.9   cholecalciferol (VITAMIN D) 1000 UNITS tablet, Take 1,000 Units by mouth daily.    docusate sodium (COLACE) 100 MG capsule, Take 1 capsule (100 mg total) by mouth 2 (two) times daily.   Multiple Vitamins-Minerals (MULTIVITAMIN WITH MINERALS) tablet, Take 1 tablet by mouth daily.   ondansetron (ZOFRAN) 8 MG tablet, Take 1 tablet (8 mg total) by mouth every 6  (six) hours as needed for nausea or vomiting.   potassium chloride (KLOR-CON) 10 MEQ tablet, TAKE TWO TABLETS BY MOUTH DAILY   valACYclovir (VALTREX) 500 MG tablet, TAKE ONE TABLET BY MOUTH TWICE A DAY AS NEEDED FOR OUTBREAKS   Magnesium 500 MG TABS, Take 1 tablet by mouth daily.  (Patient not taking: Reported on 06/13/2021)   Reviewed prior external  information including notes and imaging from  primary care provider As well as notes that were available from care everywhere and other healthcare systems.  Past medical history, social, surgical and family history all reviewed in electronic medical record.  No pertanent information unless stated regarding to the chief complaint.   Review of Systems:  No headache, visual changes, nausea, vomiting, diarrhea, constipation, dizziness, abdominal pain, skin rash, fevers, chills, night sweats, weight loss, swollen lymph nodes, body aches, joint swelling, chest pain, shortness of breath, mood changes. POSITIVE muscle aches  Objective  Blood pressure 122/72, pulse 78, height 5' 3"  (1.6 m), weight 177 lb (80.3 kg), last menstrual period 10/18/2015, SpO2 98 %.   General: No apparent distress alert and oriented x3 mood and affect normal, dressed appropriately.  HEENT: Pupils equal, extraocular movements intact  Respiratory: Patient's speak in full sentences and does not appear short of breath  Cardiovascular: No lower extremity edema, non tender, no erythema  Gait normal with good balance and coordination.  MSK: Patient does have severe tenderness over the greater trochanteric on the left side.  Patient does have a negative straight leg test but does have tenderness in the paraspinal musculature of the lumbar spine.  4-5 strength with hip abductor as well as hip flexion compared to the contralateral side.  After verbal consent patient was prepped with alcohol swab and with a 21-gauge 2 inch needle injected into the left greater trochanteric area with a  total of 2 cc of 0.5% Marcaine and 1 cc of Kenalog 40 mg/mL.  No blood loss.  Postinjection instructions given    Impression and Recommendations:     The above documentation has been reviewed and is accurate and complete Lyndal Pulley, DO

## 2021-06-13 ENCOUNTER — Ambulatory Visit: Payer: BC Managed Care – PPO | Admitting: Family Medicine

## 2021-06-13 ENCOUNTER — Ambulatory Visit (INDEPENDENT_AMBULATORY_CARE_PROVIDER_SITE_OTHER): Payer: BC Managed Care – PPO

## 2021-06-13 ENCOUNTER — Other Ambulatory Visit: Payer: Self-pay

## 2021-06-13 ENCOUNTER — Encounter: Payer: Self-pay | Admitting: Family Medicine

## 2021-06-13 VITALS — BP 122/72 | HR 78 | Ht 63.0 in | Wt 177.0 lb

## 2021-06-13 DIAGNOSIS — M76892 Other specified enthesopathies of left lower limb, excluding foot: Secondary | ICD-10-CM | POA: Diagnosis not present

## 2021-06-13 NOTE — Assessment & Plan Note (Signed)
Patient has had lumbar radiculopathy from a positive FABER test with a negative straight leg test.  We will get x-rays to further evaluate the pelvis itself.  If no significant improvement we will go back to the epidural the patient responded to previously in 2020.  Patient is in agreement with the plan.  Follow-up with me again in 2 to 3 months otherwise.

## 2021-06-13 NOTE — Assessment & Plan Note (Signed)
Patient given injection and tolerated procedure.  Discussed icing regimen and home exercises.  Patient will get an x-ray of the pelvis as well.  Patient knows to look out for other potential signs and symptoms such as abdominal pain follow-up with me again 2 to 3 months otherwise.  Differential includes a lumbar radiculopathy we will need to monitor as well.

## 2021-06-13 NOTE — Patient Instructions (Addendum)
Good to see you  Xray on way out  Exercises given  If not improvement in two weeks call or message [place] to put in epidural See me again in 2-3 months just in case.

## 2021-06-14 ENCOUNTER — Telehealth: Payer: Self-pay | Admitting: Family Medicine

## 2021-06-14 NOTE — Telephone Encounter (Signed)
Left message for patient to call back for xray results. Mild arthritis per Dr. Tamala Julian.

## 2021-06-14 NOTE — Telephone Encounter (Signed)
Patient called stating that she received notification from [employer] that her results were available for her xray. She said that she is having trouble accessing them and asked if someone could call her when Dr [name] is able to review them

## 2021-07-03 ENCOUNTER — Other Ambulatory Visit: Payer: Self-pay

## 2021-07-04 ENCOUNTER — Encounter: Payer: Self-pay | Admitting: Family Medicine

## 2021-07-04 ENCOUNTER — Ambulatory Visit: Payer: BC Managed Care – PPO | Admitting: Family Medicine

## 2021-07-04 ENCOUNTER — Ambulatory Visit (HOSPITAL_COMMUNITY)
Admission: RE | Admit: 2021-07-04 | Discharge: 2021-07-04 | Disposition: A | Discharge status: Home / Self Care | Payer: BC Managed Care – PPO | Source: Ambulatory Visit | Attending: Internal Medicine | Admitting: Internal Medicine

## 2021-07-04 VITALS — BP 118/82 | HR 81 | Temp 98.5°F | Ht 63.0 in | Wt 182.6 lb

## 2021-07-04 DIAGNOSIS — M79605 Pain in left leg: Secondary | ICD-10-CM | POA: Insufficient documentation

## 2021-07-04 NOTE — Progress Notes (Signed)
Subjective:    Patient ID: [name], female    DOB: 1961/02/18, 60 y.o.   MRN: [id number]  HPI Here for the sudden onset of pain in the left calf 6 days ago. On 06-23-21 she and her family drove to [place] for a cruise. Then on 06-29-21 they drove back to [place]. While she was there (on 06-28-21) she suddenly developed a sharp pain in the left calf. No hx of trauma. No swelling in the leg or foot. No SOB or chest pain. The pain has persisted since then. She does not smoke. No hx of blood clots.    Review of Systems  Constitutional: Negative.   Respiratory: Negative.    Cardiovascular: Negative.   Musculoskeletal:  Positive for myalgias.      Objective:   Physical Exam Constitutional:      General: She is not in acute distress.    Appearance: Normal appearance.  Cardiovascular:     Rate and Rhythm: Normal rate and regular rhythm.     Pulses: Normal pulses.     Heart sounds: Normal heart sounds.  Pulmonary:     Effort: Pulmonary effort is normal.     Breath sounds: Normal breath sounds.  Musculoskeletal:     Right lower leg: No edema.     Left lower leg: No edema.     Comments: She is tender over the left calf. No cords are felt. Homan's is positive on the left   Neurological:     Mental Status: She is alert.          Assessment & Plan:  Left calf pain, possibly due to a thrombus. We will arrange a stat venous doppler today and go from there.  Gershon Crane, MD

## 2021-07-06 ENCOUNTER — Other Ambulatory Visit: Payer: Self-pay

## 2021-07-06 ENCOUNTER — Other Ambulatory Visit: Payer: Self-pay | Admitting: Family Medicine

## 2021-07-11 ENCOUNTER — Ambulatory Visit: Payer: BC Managed Care – PPO | Admitting: Family Medicine

## 2021-07-11 ENCOUNTER — Other Ambulatory Visit: Payer: Self-pay

## 2021-07-11 ENCOUNTER — Encounter: Payer: Self-pay | Admitting: Family Medicine

## 2021-07-11 VITALS — BP 118/76 | HR 67 | Temp 98.5°F | Ht 63.0 in | Wt 178.4 lb

## 2021-07-11 DIAGNOSIS — I10 Essential (primary) hypertension: Secondary | ICD-10-CM | POA: Diagnosis not present

## 2021-07-11 DIAGNOSIS — R002 Palpitations: Secondary | ICD-10-CM

## 2021-07-11 NOTE — Progress Notes (Signed)
Subjective:    Patient ID: [name], female    DOB: [birth year], 60 y.o.   MRN: [id number]  HPI Here for palpitations that started 5 days ago. She describes feeling her heart beat very hard and very fast. These spells last about 5 minutes and then stop. She is having 2 or 3 of these spells every day. One time she was awakened from sleep by a spell. There is no associated SOB or chest pain. We saw her last week for pain in the left leg, and she had a negative venous doppler on 07-04-21. This pain has resolved completely. She denies feeling anxiety or any particular stressors recently. She uses very little caffeine. She had complete labs run on May 17 including a normal Hgb and a normal thyroid panel. She does say she has been having a lot of hot flashes with sweats lately, and her GYN thinks this is related to going through menopause. She is scheduled for a pelvic US soon.    Review of Systems  Constitutional: Negative.   Respiratory: Negative.    Cardiovascular:  Positive for palpitations. Negative for chest pain and leg swelling.  Gastrointestinal: Negative.   Endocrine: Negative.   Genitourinary: Negative.       Objective:   Physical Exam Constitutional:      Appearance: Normal appearance. She is not ill-appearing.  Cardiovascular:     Rate and Rhythm: Normal rate and regular rhythm.     Pulses: Normal pulses.     Heart sounds: Normal heart sounds.     Comments: Her EKG today is normal  Pulmonary:     Effort: Pulmonary effort is normal.     Breath sounds: Normal breath sounds.  Musculoskeletal:     Right lower leg: No edema.     Left lower leg: No edema.  Lymphadenopathy:     Cervical: No cervical adenopathy.  Neurological:     General: No focal deficit present.     Mental Status: She is alert and oriented to person, place, and time.          Assessment & Plan:  Palpitations, possibly related to going through menopause. We will arrange for her to wear a 14 day Zio  patch. She will follow up if anything changes.  We spent 35 minutes reviewing records and discussing these issues.  Gershon Crane, MD

## 2021-07-13 ENCOUNTER — Other Ambulatory Visit: Payer: Self-pay | Admitting: Family Medicine

## 2021-07-17 ENCOUNTER — Encounter: Payer: Self-pay | Admitting: Family Medicine

## 2021-07-18 NOTE — Telephone Encounter (Signed)
It comes in the mail, should be there soon

## 2021-07-31 ENCOUNTER — Telehealth: Payer: Self-pay

## 2021-07-31 ENCOUNTER — Ambulatory Visit: Payer: Self-pay

## 2021-07-31 NOTE — Telephone Encounter (Signed)
This note will be routed to [facility] for a nurse to call and triage patient.   Message from Jodie Echevaria sent at 07/31/2021 12:20 PM EDT   Patient called in to speak to a nurse say that she want to take an appetite suppressant and she is diabetic and needed to know if this will affect her. I informed her to consult with Dr Sarajane Jews her PCP but she said that he is on vacation and she was given this number by office staff to call and speak to a nurse. I did state that on call provider should be able to answer her question. Please advise Ph#  [phone]

## 2021-07-31 NOTE — Telephone Encounter (Signed)
Patient called wanting information on as appetite suppressant

## 2021-08-02 NOTE — Telephone Encounter (Signed)
Patient was give nurse advise hotline number

## 2021-08-07 ENCOUNTER — Ambulatory Visit (INDEPENDENT_AMBULATORY_CARE_PROVIDER_SITE_OTHER): Payer: BC Managed Care – PPO

## 2021-08-07 ENCOUNTER — Telehealth: Payer: Self-pay | Admitting: *Deleted

## 2021-08-07 DIAGNOSIS — R002 Palpitations: Secondary | ICD-10-CM

## 2021-08-07 NOTE — Progress Notes (Unsigned)
Patient enrolled for [employer] to mail a 14 day ZIO XT monitor to her address on file.

## 2021-08-07 NOTE — Telephone Encounter (Signed)
Apologized to the patient for the delay in her receiving her 14 day ZIO XT monitor.  Explained her order was place to be done "external".  The order would have needed to specify to be done at "[facility]", in order for it to fall into our work que.  We were not aware her order existed. I have enrolled the patient for [employer] to mail a 14 day ZIO XT monitor to her address on file today.  A message with ZIO XT instructions was sent to the patients My Chart.

## 2021-08-08 ENCOUNTER — Ambulatory Visit: Payer: BC Managed Care – PPO | Admitting: Family Medicine

## 2021-08-09 ENCOUNTER — Telehealth (INDEPENDENT_AMBULATORY_CARE_PROVIDER_SITE_OTHER): Payer: BC Managed Care – PPO | Admitting: Family Medicine

## 2021-08-09 ENCOUNTER — Encounter: Payer: Self-pay | Admitting: Family Medicine

## 2021-08-09 DIAGNOSIS — R002 Palpitations: Secondary | ICD-10-CM | POA: Diagnosis not present

## 2021-08-09 DIAGNOSIS — E119 Type 2 diabetes mellitus without complications: Secondary | ICD-10-CM

## 2021-08-09 NOTE — Progress Notes (Signed)
Subjective:    Patient ID: [name], female    DOB: 08-28-1961, 60 y.o.   MRN: [id number]  HPI Virtual Visit via Video Note  I connected with the patient on 08/09/21 at 11:00 AM EDT by a video enabled telemedicine application and verified that I am speaking with the correct person using two identifiers.  Location patient: home Location provider:work or home office Persons participating in the virtual visit: patient, provider  I discussed the limitations of evaluation and management by telemedicine and the availability of in person appointments. The patient expressed understanding and agreed to proceed.   HPI: Here to discuss her diabetes. She has made big changes to her diet in the past few months, and she is exercising daily. This has allowed her to lose 12 lbs. Her last A1c on 05-02-21 was 6.5. She is currently taking Glyburide once a day and Metformin BID. Her glucoses are much lower than they used to be, and is getting drops to the 70s and 80s. When this happens she get weak and shaky until she eats something quickly.    ROS: See pertinent positives and negatives per HPI.  Past Medical History:  Diagnosis Date   Allergy    Dr Stevphen [place]   Cardiac arrhythmia    Depression    Diabetes mellitus without complication [facility])    Fibroids    uterine sees Dr. Neva Seat in [place]    Genital warts    Heart murmur    High cholesterol    Hypertension    Irritable bowel syndrome (IBS)    Low back pain    has a herniated lumbar disc sees Dr August Saucer    Past Surgical History:  Procedure Laterality Date   ABDOMINOPLASTY  04/2013   BREAST BIOPSY Right 06/2014   COLONOSCOPY  02-10-15   per Dr. Rhea Belton, clear, repeat in 10 yrs    LUMBAR LAMINECTOMY     L3-4 on 02-24-10 per Dr Otelia Sergeant    MYOMECTOMY     laser ablation of uterine fibroids per Dr Dareen Piano 2000   TUBAL LIGATION      Family History  Problem Relation Age of Onset   Arthritis Other        Family Hx   Diabetes Other         Family Hx 1st degree relative   Hyperlipidemia Other        Family Hx   Hypertension Other        Family Hx   Prostate cancer Other        Family Hx 1st Degree relative <50   Stroke Other        First Degree Female <50   Diabetes Mother    Hypertension Mother    Diabetes Father    Hypertension Father    Heart disease Father    Diabetes Maternal Grandmother    Hypertension Maternal Grandmother    Diabetes Maternal Grandfather    Hypertension Maternal Grandfather    Diabetes Paternal Grandmother    Hypertension Paternal Grandmother    Diabetes Paternal Grandfather    Hypertension Paternal Grandfather    Colon cancer Neg Hx    Breast cancer Neg Hx      Current Outpatient Medications:    albuterol (VENTOLIN HFA) 108 (90 Base) MCG/ACT inhaler, Inhale 2 puffs into the lungs every 4 (four) hours as needed for wheezing or shortness of breath., Disp: 18 g, Rfl: 2   amLODipine (NORVASC) 10 MG tablet, Take 1 tablet (10  mg total) by mouth daily., Disp: 90 tablet, Rfl: 3   atorvastatin (LIPITOR) 20 MG tablet, Take 1 tablet (20 mg total) by mouth daily., Disp: 90 tablet, Rfl: 3   blood glucose meter kit and supplies, Dispense based on patient and insurance preference. Test once a day. E11.9, Disp: 1 each, Rfl: 0   cholecalciferol (VITAMIN D) 1000 UNITS tablet, Take 1,000 Units by mouth daily. , Disp: , Rfl:    glyBURIDE (DIABETA) 5 MG tablet, Take 1 tablet (5 mg total) by mouth daily., Disp: 90 tablet, Rfl: 3   lisinopril (ZESTRIL) 20 MG tablet, TAKE ONE TABLET BY MOUTH TWICE A DAY, Disp: 180 tablet, Rfl: 0   metFORMIN (GLUCOPHAGE) 500 MG tablet, TAKE ONE TABLET BY MOUTH TWICE A DAY WITH MEALS, Disp: 180 tablet, Rfl: 1   Multiple Vitamins-Minerals (MULTIVITAMIN WITH MINERALS) tablet, Take 1 tablet by mouth daily., Disp: , Rfl:    ondansetron (ZOFRAN) 8 MG tablet, Take 1 tablet (8 mg total) by mouth every 6 (six) hours as needed for nausea or vomiting., Disp: 30 tablet, Rfl: 0   potassium  chloride (KLOR-CON) 10 MEQ tablet, TAKE TWO TABLETS BY MOUTH DAILY, Disp: 180 tablet, Rfl: 3   valACYclovir (VALTREX) 500 MG tablet, TAKE ONE TABLET BY MOUTH TWICE A DAY AS NEEDED FOR OUTBREAKS, Disp: 30 tablet, Rfl: 4  EXAM:  VITALS per patient if applicable:  GENERAL: alert, oriented, appears well and in no acute distress  HEENT: atraumatic, conjunttiva clear, no obvious abnormalities on inspection of external nose and ears  NECK: normal movements of the head and neck  LUNGS: on inspection no signs of respiratory distress, breathing rate appears normal, no obvious gross SOB, gasping or wheezing  CV: no obvious cyanosis  MS: moves all visible extremities without noticeable abnormality  PSYCH/NEURO: pleasant and cooperative, no obvious depression or anxiety, speech and thought processing grossly intact  ASSESSMENT AND PLAN: For her diabetes, we will stop the Glyburide and stay on the Metformin as is. Hopefully she can avoid the hypoglycemic episodes report back in 2 weeks.  Gershon Crane, MD  Discussed the following assessment and plan:  No diagnosis found.     I discussed the assessment and treatment plan with the patient. The patient was provided an opportunity to ask questions and all were answered. The patient agreed with the plan and demonstrated an understanding of the instructions.   The patient was advised to call back or seek an in-person evaluation if the symptoms worsen or if the condition fails to improve as anticipated.      Review of Systems     Objective:   Physical Exam        Assessment & Plan:

## 2021-08-23 ENCOUNTER — Ambulatory Visit: Payer: BC Managed Care – PPO | Admitting: Family Medicine

## 2021-08-28 ENCOUNTER — Ambulatory Visit (INDEPENDENT_AMBULATORY_CARE_PROVIDER_SITE_OTHER): Payer: BC Managed Care – PPO | Admitting: Sports Medicine

## 2021-08-28 ENCOUNTER — Other Ambulatory Visit: Payer: Self-pay

## 2021-08-28 VITALS — BP 130/90 | HR 67 | Ht 63.0 in | Wt 170.0 lb

## 2021-08-28 DIAGNOSIS — M722 Plantar fascial fibromatosis: Secondary | ICD-10-CM | POA: Diagnosis not present

## 2021-08-28 MED ORDER — MELOXICAM 15 MG PO TABS: 15.0000 mg | ORAL_TABLET | Freq: Every day | ORAL | 0 refills | Status: DC

## 2021-08-28 NOTE — Progress Notes (Signed)
[name] D.[place] Phone: [phone]   Assessment and Plan:     1. Plantar fasciitis, right -Chronic, unchanged, initial sports medicine visit - Likely Planter fasciitis based on HPI, physical exam - Start meloxicam 15 mg daily x2 to 3 weeks, and can use remaining as needed for pain control - Start plantar fasciitis HEP including frozen water bottle rolling - Advised obtaining cushioned inserts for shoes - Offered formal PT and CSI.  Patient defers at this time, but could be considered at future date    Pertinent previous records reviewed include previous sports medicine note   Follow Up: In 4 to 6 weeks if no improvement or worsening of symptoms.  Could consider formal PT versus CSI versus imaging at that time   Subjective:   I, [name], am serving as a scribe for Dr. Glennon Mac  Chief Complaint: right foot pain   HPI: 60 year old female presenting with R foot pain   08/28/21 Patient states that she is having sharp pain located R medial heel been going on for months. Patient cannot walk barefoot, laying down at night is also when the pain is worse. Patient does state that she will get numbness and tingling.  Endorses significant pain with first few steps in the morning as well as first few steps after being sedentary.  Radiates: no Swelling: no Aggravates: any pressure, or walking a lot  Treatments tried: always wearing a shoe    Relevant Historical Information: DM type II, ongoing treatment for left hip pain  Additional pertinent review of systems negative.   Current Outpatient Medications:    albuterol (VENTOLIN HFA) 108 (90 Base) MCG/ACT inhaler, Inhale 2 puffs into the lungs every 4 (four) hours as needed for wheezing or shortness of breath., Disp: 18 g, Rfl: 2   amLODipine (NORVASC) 10 MG tablet, Take 1 tablet (10 mg total) by mouth daily., Disp: 90 tablet, Rfl: 3    atorvastatin (LIPITOR) 20 MG tablet, Take 1 tablet (20 mg total) by mouth daily., Disp: 90 tablet, Rfl: 3   blood glucose meter kit and supplies, Dispense based on patient and insurance preference. Test once a day. E11.9, Disp: 1 each, Rfl: 0   cholecalciferol (VITAMIN D) 1000 UNITS tablet, Take 1,000 Units by mouth daily. , Disp: , Rfl:    lisinopril (ZESTRIL) 20 MG tablet, TAKE ONE TABLET BY MOUTH TWICE A DAY, Disp: 180 tablet, Rfl: 0   meloxicam (MOBIC) 15 MG tablet, Take 1 tablet (15 mg total) by mouth daily., Disp: 30 tablet, Rfl: 0   metFORMIN (GLUCOPHAGE) 500 MG tablet, TAKE ONE TABLET BY MOUTH TWICE A DAY WITH MEALS, Disp: 180 tablet, Rfl: 1   Multiple Vitamins-Minerals (MULTIVITAMIN WITH MINERALS) tablet, Take 1 tablet by mouth daily., Disp: , Rfl:    ondansetron (ZOFRAN) 8 MG tablet, Take 1 tablet (8 mg total) by mouth every 6 (six) hours as needed for nausea or vomiting., Disp: 30 tablet, Rfl: 0   potassium chloride (KLOR-CON) 10 MEQ tablet, TAKE TWO TABLETS BY MOUTH DAILY, Disp: 180 tablet, Rfl: 3   valACYclovir (VALTREX) 500 MG tablet, TAKE ONE TABLET BY MOUTH TWICE A DAY AS NEEDED FOR OUTBREAKS, Disp: 30 tablet, Rfl: 4   glyBURIDE (DIABETA) 5 MG tablet, Take 1 tablet (5 mg total) by mouth daily. (Patient not taking: Reported on 08/28/2021), Disp: 90 tablet, Rfl: 3   Objective:     Vitals:   08/28/21  1540  BP: 130/90  Pulse: 67  SpO2: 97%  Weight: 170 lb (77.1 kg)  Height: 5' 3"  (1.6 m)      Body mass index is 30.11 kg/m.    Physical Exam:    Gen: Appears well, nad, nontoxic and pleasant Psych: Alert and oriented, appropriate mood and affect Neuro: sensation intact, strength is 5/5 with df/pf/inv/ev, muscle tone wnl Skin: no susupicious lesions or rashes  Right foot and ankle: no deformity, no swelling or effusion TTP anterior/medial calcaneus NTTP over fibular head, lat mal, medial mal, achilles, navicular, base of 5th, ATFL, CFL, deltoid,  midfoot ROM DF 30, PF 45,  inv/ev intact Negative ant drawer, talar tilt, rotation test, squeeze test. Neg thompson No pain with resisted inversion or eversion    Electronically signed by:  [name] D.[facility] 5:07 PM 08/28/21

## 2021-08-28 NOTE — Patient Instructions (Addendum)
Good to see you  Plantar fasciitis exercises Meloxicam '15mg'$  2-3 weeks  Use frozen water bottle for rolling  Would recommend cushion insert for heel (fleet Feet) See me again in 4 weeks

## 2021-09-08 MED ORDER — PHENTERMINE HCL 37.5 MG PO CAPS: 37.5000 mg | ORAL_CAPSULE | ORAL | 1 refills | Status: DC

## 2021-09-08 NOTE — Addendum Note (Signed)
Addended by: Alysia Penna A on: 09/08/2021 07:44 AM   Modules accepted: Orders

## 2021-09-08 NOTE — Telephone Encounter (Signed)
Yes she can safely take Phentermine. This is the only appetite suppressant I prescribe because it is safe and affordable. I will send in a rx for this to her pharmacy

## 2021-09-08 NOTE — Telephone Encounter (Signed)
Spoke with patient to inform that medication Phentermine has been sent to the pharmacy.

## 2021-09-11 ENCOUNTER — Telehealth: Payer: Self-pay | Admitting: Family Medicine

## 2021-09-11 ENCOUNTER — Ambulatory Visit: Payer: BC Managed Care – PPO | Admitting: Sports Medicine

## 2021-09-11 MED ORDER — PHENTERMINE HCL 15 MG PO CAPS: 15.0000 mg | ORAL_CAPSULE | ORAL | 1 refills | Status: DC

## 2021-09-11 NOTE — Telephone Encounter (Signed)
Patient called stating that the medication phentermine 37.5 MG capsule is giving her increased heart rate and she is not able to sleep. She says that her heart rate increases for about 10-15 minutes and then levels back out.   Patient says that the medication does work with her appetite and she has lost about 6 pounds since starting medication.  Patient would like to know if there are other options that work like phentermine.   Patient would like a call back at [phone]. She says that if she does not answer, leaving a voicemail message is fine.  Please advise.

## 2021-09-11 NOTE — Telephone Encounter (Signed)
This is the only appetite suppressant I use, but she may do better on a lower dose. Ask if she wants to try this

## 2021-09-11 NOTE — Telephone Encounter (Signed)
Spoke with pt verbalized understanding that lower dose for phentamine 15 mg was sent to her pharmacy

## 2021-09-11 NOTE — Telephone Encounter (Signed)
I sent in for 15 mg capsules for her to try

## 2021-10-02 ENCOUNTER — Telehealth (INDEPENDENT_AMBULATORY_CARE_PROVIDER_SITE_OTHER): Payer: BC Managed Care – PPO | Admitting: Family Medicine

## 2021-10-02 ENCOUNTER — Encounter: Payer: Self-pay | Admitting: Family Medicine

## 2021-10-02 VITALS — BP 114/67 | HR 75 | Wt 160.0 lb

## 2021-10-02 DIAGNOSIS — R531 Weakness: Secondary | ICD-10-CM | POA: Diagnosis not present

## 2021-10-02 NOTE — Progress Notes (Signed)
Subjective:    Patient ID: [name], female    DOB: [birth year], 60 y.o.   MRN: [id number]  HPI Virtual Visit via Video Note  I connected with the patient on 10/02/21 at  1:15 PM EDT by a video enabled telemedicine application and verified that I am speaking with the correct person using two identifiers.  Location patient: home Location provider:work or home office Persons participating in the virtual visit: patient, provider  I discussed the limitations of evaluation and management by telemedicine and the availability of in person appointments. The patient expressed understanding and agreed to proceed.   HPI: Here for recurrent episodes of symptoms including nausea without vomiting, fatigue, loss of balance, and headaches. No fever or ST or cough. No SOB. She had a Covid-19 infection one year ago, and she has had these recurrent symptoms ever since. They were coming about once a mont, but now they came twice a month. Her current symptoms began yesterday, and they are the same as always. She drinks plenty of fluids though her appetite is down. Her BP today was 114/67 and her glucose was 103. She takes Zofran for the nausea and this helps. She tested negative for the Covid virus this morning.    ROS: See pertinent positives and negatives per HPI.  Past Medical History:  Diagnosis Date   Allergy    Dr Stevphen [place]   Cardiac arrhythmia    Depression    Diabetes mellitus without complication [facility])    Fibroids    uterine sees Dr. Neva Seat in [place]    Genital warts    Heart murmur    High cholesterol    Hypertension    Irritable bowel syndrome (IBS)    Low back pain    has a herniated lumbar disc sees Dr August Saucer    Past Surgical History:  Procedure Laterality Date   ABDOMINOPLASTY  04/2013   BREAST BIOPSY Right 06/2014   COLONOSCOPY  02-10-15   per Dr. Rhea Belton, clear, repeat in 10 yrs    LUMBAR LAMINECTOMY     L3-4 on 02-24-10 per Dr Otelia Sergeant    MYOMECTOMY     laser  ablation of uterine fibroids per Dr Dareen Piano 2000   TUBAL LIGATION      Family History  Problem Relation Age of Onset   Arthritis Other        Family Hx   Diabetes Other        Family Hx 1st degree relative   Hyperlipidemia Other        Family Hx   Hypertension Other        Family Hx   Prostate cancer Other        Family Hx 1st Degree relative <50   Stroke Other        First Degree Female <50   Diabetes Mother    Hypertension Mother    Diabetes Father    Hypertension Father    Heart disease Father    Diabetes Maternal Grandmother    Hypertension Maternal Grandmother    Diabetes Maternal Grandfather    Hypertension Maternal Grandfather    Diabetes Paternal Grandmother    Hypertension Paternal Grandmother    Diabetes Paternal Grandfather    Hypertension Paternal Grandfather    Colon cancer Neg Hx    Breast cancer Neg Hx      Current Outpatient Medications:    albuterol (VENTOLIN HFA) 108 (90 Base) MCG/ACT inhaler, Inhale 2 puffs into the lungs every 4 (four)  hours as needed for wheezing or shortness of breath., Disp: 18 g, Rfl: 2   amLODipine (NORVASC) 10 MG tablet, Take 1 tablet (10 mg total) by mouth daily., Disp: 90 tablet, Rfl: 3   atorvastatin (LIPITOR) 20 MG tablet, Take 1 tablet (20 mg total) by mouth daily., Disp: 90 tablet, Rfl: 3   blood glucose meter kit and supplies, Dispense based on patient and insurance preference. Test once a day. E11.9, Disp: 1 each, Rfl: 0   cholecalciferol (VITAMIN D) 1000 UNITS tablet, Take 1,000 Units by mouth daily. , Disp: , Rfl:    lisinopril (ZESTRIL) 20 MG tablet, TAKE ONE TABLET BY MOUTH TWICE A DAY, Disp: 180 tablet, Rfl: 0   meloxicam (MOBIC) 15 MG tablet, Take 1 tablet (15 mg total) by mouth daily., Disp: 30 tablet, Rfl: 0   metFORMIN (GLUCOPHAGE) 500 MG tablet, TAKE ONE TABLET BY MOUTH TWICE A DAY WITH MEALS, Disp: 180 tablet, Rfl: 1   Multiple Vitamins-Minerals (MULTIVITAMIN WITH MINERALS) tablet, Take 1 tablet by mouth daily.,  Disp: , Rfl:    ondansetron (ZOFRAN) 8 MG tablet, Take 1 tablet (8 mg total) by mouth every 6 (six) hours as needed for nausea or vomiting., Disp: 30 tablet, Rfl: 0   phentermine 15 MG capsule, Take 1 capsule (15 mg total) by mouth every morning., Disp: 90 capsule, Rfl: 1   potassium chloride (KLOR-CON) 10 MEQ tablet, TAKE TWO TABLETS BY MOUTH DAILY, Disp: 180 tablet, Rfl: 3   valACYclovir (VALTREX) 500 MG tablet, TAKE ONE TABLET BY MOUTH TWICE A DAY AS NEEDED FOR OUTBREAKS, Disp: 30 tablet, Rfl: 4  EXAM:  VITALS per patient if applicable:  GENERAL: alert, oriented, appears fatigued, lying in bed   HEENT: atraumatic, conjunttiva clear, no obvious abnormalities on inspection of external nose and ears  NECK: normal movements of the head and neck  LUNGS: on inspection no signs of respiratory distress, breathing rate appears normal, no obvious gross SOB, gasping or wheezing  CV: no obvious cyanosis  MS: moves all visible extremities without noticeable abnormality  PSYCH/NEURO: pleasant and cooperative, no obvious depression or anxiety, speech and thought processing grossly intact  ASSESSMENT AND PLAN: Recurrent symptoms as above which may be lingering effects of a Covid-19 infection. She will come by our lab in the next few days for tests including a CBC. Gershon Crane, MD  Discussed the following assessment and plan:  No diagnosis found.     I discussed the assessment and treatment plan with the patient. The patient was provided an opportunity to ask questions and all were answered. The patient agreed with the plan and demonstrated an understanding of the instructions.   The patient was advised to call back or seek an in-person evaluation if the symptoms worsen or if the condition fails to improve as anticipated.      Review of Systems     Objective:   Physical Exam        Assessment & Plan:

## 2021-10-03 ENCOUNTER — Other Ambulatory Visit (INDEPENDENT_AMBULATORY_CARE_PROVIDER_SITE_OTHER): Payer: BC Managed Care – PPO

## 2021-10-03 ENCOUNTER — Other Ambulatory Visit: Payer: Self-pay

## 2021-10-03 DIAGNOSIS — R531 Weakness: Secondary | ICD-10-CM

## 2021-10-03 LAB — BASIC METABOLIC PANEL
BUN: 18 mg/dL (ref 6–23)
CO2: 28 mEq/L (ref 19–32)
Calcium: 9.3 mg/dL (ref 8.4–10.5)
Chloride: 103 mEq/L (ref 96–112)
Creatinine, Ser: 0.9 mg/dL (ref 0.40–1.20)
GFR: 70.39 mL/min (ref >60.00)
Glucose, Bld: 129 mg/dL — ABNORMAL HIGH (ref 70–99)
Potassium: 3.8 mEq/L (ref 3.5–5.1)
Sodium: 140 mEq/L (ref 135–145)

## 2021-10-03 LAB — HEPATIC FUNCTION PANEL
ALT: 14 U/L (ref 0–35)
AST: 17 U/L (ref 0–37)
Albumin: 4.4 g/dL (ref 3.5–5.2)
Alkaline Phosphatase: 117 U/L (ref 39–117)
Bilirubin, Direct: 0.1 mg/dL (ref 0.0–0.3)
Total Bilirubin: 0.9 mg/dL (ref 0.2–1.2)
Total Protein: 6.8 g/dL (ref 6.0–8.3)

## 2021-10-03 LAB — CBC WITH DIFFERENTIAL/PLATELET
Basophils Absolute: 0 10*3/uL (ref 0.0–0.1)
Basophils Relative: 0.4 % (ref 0.0–3.0)
Eosinophils Absolute: 0.2 10*3/uL (ref 0.0–0.7)
Eosinophils Relative: 2.2 % (ref 0.0–5.0)
HCT: 40.6 % (ref 36.0–46.0)
Hemoglobin: 13.3 g/dL (ref 12.0–15.0)
Lymphocytes Relative: 35.5 % (ref 12.0–46.0)
Lymphs Abs: 2.8 10*3/uL (ref 0.7–4.0)
MCHC: 32.9 g/dL (ref 30.0–36.0)
MCV: 88.4 fl (ref 78.0–100.0)
Monocytes Absolute: 0.5 10*3/uL (ref 0.1–1.0)
Monocytes Relative: 6.6 % (ref 3.0–12.0)
Neutro Abs: 4.3 10*3/uL (ref 1.4–7.7)
Neutrophils Relative %: 55.3 % (ref 43.0–77.0)
Platelets: 374 10*3/uL (ref 150.0–400.0)
RBC: 4.59 Mil/uL (ref 3.87–5.11)
RDW: 14.2 % (ref 11.5–15.5)
WBC: 7.8 10*3/uL (ref 4.0–10.5)

## 2021-10-03 LAB — VITAMIN B12: Vitamin B-12: 281 pg/mL (ref 211–911)

## 2021-10-03 LAB — HEMOGLOBIN A1C: Hgb A1c MFr Bld: 6.5 % (ref 4.6–6.5)

## 2021-10-03 LAB — TSH: TSH: 1.02 u[IU]/mL (ref 0.35–5.50)

## 2021-10-05 ENCOUNTER — Ambulatory Visit: Payer: BC Managed Care – PPO | Admitting: Family Medicine

## 2021-10-05 ENCOUNTER — Encounter: Payer: Self-pay | Admitting: Family Medicine

## 2021-10-05 ENCOUNTER — Other Ambulatory Visit: Payer: Self-pay

## 2021-10-05 VITALS — BP 136/82 | HR 69 | Temp 98.0°F | Wt 160.0 lb

## 2021-10-05 DIAGNOSIS — J0191 Acute recurrent sinusitis, unspecified: Secondary | ICD-10-CM | POA: Diagnosis not present

## 2021-10-05 MED ORDER — DOXYCYCLINE HYCLATE 100 MG PO CAPS: 100.0000 mg | ORAL_CAPSULE | Freq: Two times a day (BID) | ORAL | 0 refills | Status: AC

## 2021-10-05 NOTE — Progress Notes (Signed)
Subjective:    Patient ID: [name], female    DOB: 10-Feb-1961, 60 y.o.   MRN: [id number]  HPI Here for one week of stuffy head, PND, ST, and nausea without vomiting. No fever or cough or SOB. She has tested negative for the Covid-19 virus.    Review of Systems  Constitutional: Negative.   HENT:  Positive for congestion, postnasal drip and sore throat.   Eyes: Negative.   Respiratory: Negative.    Cardiovascular: Negative.   Gastrointestinal:  Positive for nausea. Negative for abdominal distention, abdominal pain, anal bleeding, blood in stool, constipation, diarrhea and vomiting.      Objective:   Physical Exam Constitutional:      Appearance: Normal appearance.  HENT:     Right Ear: Tympanic membrane, ear canal and external ear normal.     Left Ear: Tympanic membrane, ear canal and external ear normal.     Nose: Nose normal.     Mouth/Throat:     Pharynx: Oropharynx is clear.  Cardiovascular:     Rate and Rhythm: Normal rate and regular rhythm.     Pulses: Normal pulses.     Heart sounds: Normal heart sounds.  Pulmonary:     Effort: Pulmonary effort is normal.     Breath sounds: Normal breath sounds.  Lymphadenopathy:     Cervical: No cervical adenopathy.  Neurological:     Mental Status: She is alert.          Assessment & Plan:  Sinsusitis, treat with Doxycycline. Add Allegra daily as needed. Gershon Crane, MD

## 2021-10-23 ENCOUNTER — Encounter: Payer: Self-pay | Admitting: Nurse Practitioner

## 2021-10-23 ENCOUNTER — Ambulatory Visit: Payer: BC Managed Care – PPO | Admitting: Nurse Practitioner

## 2021-10-23 VITALS — BP 108/72 | HR 64 | Ht 63.0 in | Wt 170.4 lb

## 2021-10-23 DIAGNOSIS — K5909 Other constipation: Secondary | ICD-10-CM

## 2021-10-23 DIAGNOSIS — R140 Abdominal distension (gaseous): Secondary | ICD-10-CM | POA: Diagnosis not present

## 2021-10-23 DIAGNOSIS — R110 Nausea: Secondary | ICD-10-CM

## 2021-10-23 MED ORDER — LUBIPROSTONE 8 MCG PO CAPS: 8.0000 ug | ORAL_CAPSULE | Freq: Two times a day (BID) | ORAL | 3 refills | Status: DC

## 2021-10-23 NOTE — Progress Notes (Signed)
ASSESSMENT AND PLAN     # 60 yo female with chronic, intermittent nausea, bloating /abdominal discomfort  / constipation. Last seen in 2020 for these symptoms and there were no GI findings on CTAP  at the time.  Just as when I saw her in 2020, it is difficult to know how much of her symptoms are related to severe constipation.  She has only had 2 bowel movements after restarting MiraLAX nearly a month ago --Just as before, I think we need to first treat her constipation to see which if any symptoms persist.  -- For now will discontinue MiraLAX.  Try Amitiza 8 mcg twice daily --Continue with good hydration --Follow-up with me in 3 to 4 weeks.  If bloating does not improve with the constipation then consider testing and/or treating empirically for SIBO and obtain tTg, IgA though celiac disease seems unlikely.   # Nausea / heartburn. She has been on Omeprazole for close to a month without much improvement.  --Continue omeprazole 40 mg 30 minutes before breakfast.  For now we will add famotidine 20 mg nightly --Anti-reflux measures discussed including avoidance of trigger foods and evening meals / bedtime snacks. If able, elevate head of bed 6-8 inches. If unable to elevate the head of the bed consider a wedge pillow.   Weight reduction / maintaining a healthy BMI) as increased abdominal girth is associated with reflux. --Follow-up with me in 4 weeks.  If persistent symptoms despite above then consider EGD  # History of elevated alk phosphatase, likely not of liver origin and resolved. --GGT was normal.  Alkaline phosphatase isoenzymes with normal liver fraction. No bile duct dilation on ultrasound December 2019 (done to evaluate the elevated alk phos) . Antimitochondrial antibody also normal.   --Alk phos was normal on lastest labs 10/03/21  #Screening colonoscopy.  She is up-to-date.  On for a 10-year recall in 01/05/2025  HISTORY OF PRESENT ILLNESS    Chief Complaint : belching, heartburn,  nausea, constipation.   [name] is a 60 y.o. female known to Dr. Hilarie Fredrickson with a past medical history  of hypertension, asthma, OSA, diabetes , HDL, depression, anxiety.  Additional medical history as listed in [place] .   [name] had a normal screening colonoscopy in 2016.  I saw her in the office twice in 2020 for evaluation of right-sided abdominal pain , bloating,  nausea,  chronic constipation.  She was started on Miralax and recommended to try Ibuprofen for what seemed to be musculoskeletal pain.  She returned for follow up a month later with only transient relief of symptoms despite improvement in constipation. Her pain was mainly related to activity such as walking / bending  which again seemed unlikely of GI origin.  CT scan done March 2020 for persistent symptoms and remarkable for hepatic steatosis , uterine leiomyoma, and a hematoma in the lateral upper thigh.  No findings to explain her GI symptoms were found on CT scan. Per patient's request we sent the CT scan to her GYN   Additionally she has a history of elevated alkaline phosphatase level.  [name] is here with a three month history of recurrent nausea, belching,  heartburn. She had been doing okay off Miralax for more than a year. She restarted daily Miralax two weeks ago but has since only had two BMs . Drinking hot water is no longer helpful for constipation . A healthy diet seems to constipate her more. She started Omprazole 40  mg about three weeks ago but is still having constant belching, chest discomfort and sour burps. She started Phentermine in late September but GERD symptoms predate that. She doesn't drink caffeine   Current Medications, Allergies, Past Medical History, Past Surgical History, Family History and Social History were reviewed in [employer] record.     Current Outpatient Medications  Medication Sig Dispense Refill   albuterol (VENTOLIN HFA) 108 (90 Base) MCG/ACT inhaler Inhale 2  puffs into the lungs every 4 (four) hours as needed for wheezing or shortness of breath. 18 g 2   amLODipine (NORVASC) 10 MG tablet Take 1 tablet (10 mg total) by mouth daily. 90 tablet 3   atorvastatin (LIPITOR) 20 MG tablet Take 1 tablet (20 mg total) by mouth daily. 90 tablet 3   blood glucose meter kit and supplies Dispense based on patient and insurance preference. Test once a day. E11.9 1 each 0   cholecalciferol (VITAMIN D) 1000 UNITS tablet Take 1,000 Units by mouth daily.      lisinopril (ZESTRIL) 20 MG tablet TAKE ONE TABLET BY MOUTH TWICE A DAY 180 tablet 0   meloxicam (MOBIC) 15 MG tablet Take 1 tablet (15 mg total) by mouth daily. 30 tablet 0   metFORMIN (GLUCOPHAGE) 500 MG tablet TAKE ONE TABLET BY MOUTH TWICE A DAY WITH MEALS 180 tablet 1   Multiple Vitamins-Minerals (MULTIVITAMIN WITH MINERALS) tablet Take 1 tablet by mouth daily.     ondansetron (ZOFRAN) 8 MG tablet Take 1 tablet (8 mg total) by mouth every 6 (six) hours as needed for nausea or vomiting. 30 tablet 0   potassium chloride (KLOR-CON) 10 MEQ tablet TAKE TWO TABLETS BY MOUTH DAILY 180 tablet 3   valACYclovir (VALTREX) 500 MG tablet TAKE ONE TABLET BY MOUTH TWICE A DAY AS NEEDED FOR OUTBREAKS 30 tablet 4   No current facility-administered medications for this visit.    Review of Systems: No chest pain. No shortness of breath. No urinary complaints.   PHYSICAL EXAM :    Wt Readings from Last 3 Encounters:  10/23/21 170 lb 6.4 oz (77.3 kg)  10/05/21 160 lb (72.6 kg)  10/02/21 160 lb (72.6 kg)    BP 108/72   Pulse 64   Ht 5' 3"  (1.6 m)   Wt 170 lb 6.4 oz (77.3 kg)   LMP 10/18/2015   BMI 30.19 kg/m  Constitutional:  Generally well appearing female in no acute distress. Psychiatric: Pleasant. Normal mood and affect. Behavior is normal. EENT: Pupils normal.  Conjunctivae are normal. No scleral icterus. Neck supple.  Cardiovascular: Normal rate, regular rhythm. No edema Pulmonary/chest: Effort normal and  breath sounds normal. No wheezing, rales or rhonchi. Abdominal: Soft, nondistended, nontender. Bowel sounds active throughout. There are no masses palpable. No hepatomegaly. Neurological: Alert and oriented to person place and time. Skin: Skin is warm and dry. No rashes noted.  [name], NP  10/23/2021, 10:11 AM

## 2021-10-23 NOTE — Patient Instructions (Signed)
If you are age 58 or younger, your body mass index should be between 19-25. Your Body mass index is 30.19 kg/m. If this is out of the aformentioned range listed, please consider follow up with your Primary Care Provider.   The [facility] providers would like to encourage you to use [facility] to communicate with providers for non-urgent requests or questions.  Due to long hold times on the telephone, sending your provider a message by [facility] may be faster and more efficient way to get a response. Please allow 48 business hours for a response.  Please remember that this is for non-urgent requests/questions.  MEDICATION: We have sent the following medication to your pharmacy for you to pick up at your convenience: Amatiza 74mcg, take 1 twice a day.  Add over the counter Pepcid 20 MG daily at bedtime. Stop Miralax.  We have scheduled you a follow up with [name], NP on 11/16/21 at 9:30. It was great seeing you today! Thank you for entrusting me with your care and choosing [facility].  [name], NP  Gastroesophageal Reflux Disease, Adult Gastroesophageal reflux (GER) happens when acid from the stomach flows up into the tube that connects the mouth and the stomach (esophagus). Normally, food travels down the esophagus and stays in the stomach to be digested. With GER, food and stomach acid sometimes move back up into the esophagus. You may have a disease called gastroesophageal reflux disease (GERD) if the reflux: Happens often. Causes frequent or very bad symptoms. Causes problems such as damage to the esophagus. When this happens, the esophagus becomes sore and swollen. Over time, GERD can make small holes (ulcers) in the lining of the esophagus. What are the causes? This condition is caused by a problem with the muscle between the esophagus and the stomach. When this muscle is weak or not normal, it does not close properly to keep food and acid from coming back up from  the stomach. The muscle can be weak because of: Tobacco use. Pregnancy. Having a certain type of hernia (hiatal hernia). Alcohol use. Certain foods and drinks, such as coffee, chocolate, onions, and peppermint. What increases the risk? Being overweight. Having a disease that affects your connective tissue. Taking NSAIDs, such a ibuprofen. What are the signs or symptoms? Heartburn. Difficult or painful swallowing. The feeling of having a lump in the throat. A bitter taste in the mouth. Bad breath. Having a lot of saliva. Having an upset or bloated stomach. Burping. Chest pain. Different conditions can cause chest pain. Make sure you see your doctor if you have chest pain. Shortness of breath or wheezing. A long-term cough or a cough at night. Wearing away of the surface of teeth (tooth enamel). Weight loss. How is this treated? Making changes to your diet. Taking medicine. Having surgery. Treatment will depend on how bad your symptoms are. Follow these instructions at home: Eating and drinking  Follow a diet as told by your doctor. You may need to avoid foods and drinks such as: Coffee and tea, with or without caffeine. Drinks that contain alcohol. Energy drinks and sports drinks. Bubbly (carbonated) drinks or sodas. Chocolate and cocoa. Peppermint and mint flavorings. Garlic and onions. Horseradish. Spicy and acidic foods. These include peppers, chili powder, curry powder, vinegar, hot sauces, and BBQ sauce. Citrus fruit juices and citrus fruits, such as oranges, lemons, and limes. Tomato-based foods. These include red sauce, chili, salsa, and pizza with red sauce. Fried and fatty foods. These include donuts,  french fries, potato chips, and high-fat dressings. High-fat meats. These include hot dogs, rib eye steak, sausage, ham, and bacon. High-fat dairy items, such as whole milk, butter, and cream cheese. Eat small meals often. Avoid eating large meals. Avoid drinking  large amounts of liquid with your meals. Avoid eating meals during the 2-3 hours before bedtime. Avoid lying down right after you eat. Do not exercise right after you eat. Lifestyle  Do not smoke or use any products that contain nicotine or tobacco. If you need help quitting, ask your doctor. Try to lower your stress. If you need help doing this, ask your doctor. If you are overweight, lose an amount of weight that is healthy for you. Ask your doctor about a safe weight loss goal. General instructions Pay attention to any changes in your symptoms. Take over-the-counter and prescription medicines only as told by your doctor. Do not take aspirin, ibuprofen, or other NSAIDs unless your doctor says it is okay. Wear loose clothes. Do not wear anything tight around your waist. Raise (elevate) the head of your bed about 6 inches (15 cm). You may need to use a wedge to do this. Avoid bending over if this makes your symptoms worse. Keep all follow-up visits. Contact a doctor if: You have new symptoms. You lose weight and you do not know why. You have trouble swallowing or it hurts to swallow. You have wheezing or a cough that keeps happening. You have a hoarse voice. Your symptoms do not get better with treatment. Get help right away if: You have sudden pain in your arms, neck, jaw, teeth, or back. You suddenly feel sweaty, dizzy, or light-headed. You have chest pain or shortness of breath. You vomit and the vomit is green, yellow, or black, or it looks like blood or coffee grounds. You faint. Your poop (stool) is red, bloody, or black. You cannot swallow, drink, or eat. These symptoms may represent a serious problem that is an emergency. Do not wait to see if the symptoms will go away. Get medical help right away. Call your local emergency services (911 in the [place] Do not drive yourself to the hospital. Summary If a person has gastroesophageal reflux disease (GERD), food and stomach acid  move back up into the esophagus and cause symptoms or problems such as damage to the esophagus. Treatment will depend on how bad your symptoms are. Follow a diet as told by your doctor. Take all medicines only as told by your doctor. This information is not intended to replace advice given to you by your health care provider. Make sure you discuss any questions you have with your health care provider. Document Revised: 06/13/2020 Document Reviewed: 06/13/2020 Elsevier Patient Education  [employer].

## 2021-10-29 ENCOUNTER — Other Ambulatory Visit: Payer: Self-pay | Admitting: Family Medicine

## 2021-10-30 ENCOUNTER — Ambulatory Visit: Payer: BC Managed Care – PPO | Admitting: Nurse Practitioner

## 2021-10-30 NOTE — Telephone Encounter (Signed)
Last office visit- 10/05/21 Last refill- 02/20/21--30 tabs no refills  No future office visit scheduled.   Can this patient receive a refill?

## 2021-10-30 NOTE — Progress Notes (Signed)
Addendum: Reviewed and agree with assessment and management plan. Klayton Monie M, MD  

## 2021-10-31 ENCOUNTER — Telehealth: Payer: Self-pay | Admitting: Nurse Practitioner

## 2021-10-31 NOTE — Telephone Encounter (Signed)
Patient called states the Amitiza medication given to her is not working and she would like to discuss what the next step will be.

## 2021-11-01 MED ORDER — MIRALAX 17 GM/SCOOP PO POWD: ORAL | 0 refills | Status: DC

## 2021-11-01 NOTE — Telephone Encounter (Signed)
-----   Message from Willia Craze, NP sent at 10/31/2021  4:07 PM EST ----- Eustaquio Maize, I hit the done button by accident so I did not get to reply on the actual phone message.  Patient called to say the Amitiza was not working.  It is difficult to sort out her bloating and other symptoms until we can get her bowels moving well.  If Amitiza is not working then lets see if we can try her on Linzess though it may cause loose stool at least initially.  Recommend Linzess 145 mcg daily on an empty stomach.  If she prefers we could just keep her on Amitiza and add MiraLAX 1-2 times a day.  I believe she has a follow-up with me early December.  Thanks

## 2021-11-01 NOTE — Telephone Encounter (Signed)
Spoke with the patient. She is agreeable to adding the Miralax first. She reports the Amitiza worked at first but very quickly became ineffective. Follow up by phone or message through her My Chart 11/06/21.

## 2021-11-14 ENCOUNTER — Other Ambulatory Visit: Payer: Self-pay

## 2021-11-16 ENCOUNTER — Encounter: Payer: Self-pay | Admitting: Nurse Practitioner

## 2021-11-16 ENCOUNTER — Ambulatory Visit (INDEPENDENT_AMBULATORY_CARE_PROVIDER_SITE_OTHER): Payer: BC Managed Care – PPO | Admitting: Nurse Practitioner

## 2021-11-16 VITALS — BP 104/72 | HR 78 | Ht 63.0 in | Wt 174.1 lb

## 2021-11-16 DIAGNOSIS — R142 Eructation: Secondary | ICD-10-CM | POA: Diagnosis not present

## 2021-11-16 DIAGNOSIS — K5909 Other constipation: Secondary | ICD-10-CM

## 2021-11-16 DIAGNOSIS — R12 Heartburn: Secondary | ICD-10-CM

## 2021-11-16 MED ORDER — LINACLOTIDE 145 MCG PO CAPS: 145.0000 ug | ORAL_CAPSULE | Freq: Every day | ORAL | 3 refills | Status: DC

## 2021-11-16 NOTE — Progress Notes (Signed)
Addendum: Reviewed and agree with assessment and management plan. Khameron Gruenwald M, MD  

## 2021-11-16 NOTE — Progress Notes (Signed)
[facility] [place] [facility] [place] Phone: [phone] Subjective:   [name], am serving as a scribe for Dr. Hulan Allen.  This visit occurred during the SARS-CoV-2 public health emergency.  Safety protocols were in place, including screening questions prior to the visit, additional usage of staff PPE, and extensive cleaning of exam room while observing appropriate contact time as indicated for disinfecting solutions.    I'm seeing this patient by the request  of:  Stephanie Morale, MD  CC: Right foot pain  [id number]  Saw Dr. Glennon Allen 08/28/2021 1. Plantar fasciitis, right -Chronic, unchanged, initial sports medicine visit - Likely Planter fasciitis based on HPI, physical exam - Start meloxicam 15 mg daily x2 to 3 weeks, and can use remaining as needed for pain control - Start plantar fasciitis HEP including frozen water bottle rolling - Advised obtaining cushioned inserts for shoes - Offered formal PT and CSI.  Patient defers at this time, but could be considered at future date     Pertinent previous records reviewed include previous sports medicine note   Follow Up: In 4 to 6 weeks if Allen improvement or worsening of symptoms.  Could consider formal PT versus CSI versus imaging at that time  Updated 11/17/2021 [name] is a 60 y.o. female coming in with complaint of right heel pain for past year. Patient has tried exercises which have not helped. Pain increase when wearing flat, wedges or barefoot. Pain over medial aspect of calcaneous. Painful intermittently at night and in mornings.  Patient states that the pain does seem to get worse during the day.     Past Medical History:  Diagnosis Date   Allergy    Dr [name]   Cardiac arrhythmia    Depression    Diabetes mellitus without complication [facility])    Fibroids    uterine sees Dr. Carlota Allen in [place]    Genital warts    Heart murmur    High cholesterol     Hypertension    Irritable bowel syndrome (IBS)    Low back pain    has a herniated lumbar disc sees Dr [name]   Past Surgical History:  Procedure Laterality Date   ABDOMINOPLASTY  04/2013   BREAST BIOPSY Right 06/2014   COLONOSCOPY  02/10/2015   per Dr. Hilarie Allen, clear, repeat in 10 yrs    LUMBAR LAMINECTOMY  2010   L3-4 on 02-24-10 per Dr [name]    MYOMECTOMY     laser ablation of uterine fibroids per Dr [name] 2000   TUBAL LIGATION     Social History   Socioeconomic History   Marital status: Married    Spouse name: Not on file   Number of children: 4   Years of education: Not on file   Highest education level: Some college, [name]  Occupational History   Occupation: Pharmacist, hospital  Tobacco Use   Smoking status: Former    Packs/day: 0.50    Years: 1.00    Pack years: 0.50    Types: Cigarettes    Quit date: 12/17/1996    Years since quitting: 24.9   Smokeless tobacco: Never  Vaping Use   Vaping Use: Never used  Substance and Sexual Activity   Alcohol use: Yes    Alcohol/week: 0.0 standard drinks    Comment: occ   Drug use: Allen   Sexual activity: Yes    Birth control/protection: Surgical  Other Topics Concern   Not  on file  Social History Narrative   Not on file   Social Determinants of Health   Financial Resource Strain: Low Risk    Difficulty of Paying Living Expenses: Not hard at all  Food Insecurity: Allen Food Insecurity   Worried About Charity fundraiser in the Last Year: Never true   Arboriculturist in the Last Year: Never true  Transportation Needs: Allen Transportation Needs   Lack of Transportation (Medical): Allen   Lack of Transportation (Non-Medical): Allen  Physical Activity: Sufficiently Active   Days of Exercise per Week: 6 days   Minutes of Exercise per Session: 30 min  Stress: Stress Concern Present   Feeling of Stress : To some extent  Social Connections: Engineer, building services of Communication with Friends and Family: More than three times a  week   Frequency of Social Gatherings with Friends and Family: Once a week   Attends Religious Services: More than 4 times per year   Active Member of [employer] or Organizations: Yes   Attends Music therapist: More than 4 times per year   Marital Status: Married   Allergies  Allergen Reactions   Iodinated Diagnostic Agents Swelling    Tongue swelling   Iodine Swelling    Tongue swelling   Peanut-Containing Drug Products Anaphylaxis   Hctz [Hydrochlorothiazide] Other (See Comments)    Blood pressure goes up   [employer] Other (See Comments)     Per allergy test   Cephalexin Rash   Ciprofloxacin Diarrhea   Family History  Problem Relation Age of Onset   Diabetes Mother    Hypertension Mother    Diabetes Father    Hypertension Father    Heart disease Father    Breast cancer Sister    Diabetes Maternal Grandmother    Hypertension Maternal Grandmother    Diabetes Maternal Grandfather    Hypertension Maternal Grandfather    Diabetes Paternal Grandmother    Hypertension Paternal Grandmother    Diabetes Paternal Grandfather    Hypertension Paternal Grandfather    Arthritis Other        Family Hx   Diabetes Other        Family Hx 1st degree relative   Hyperlipidemia Other        Family Hx   Hypertension Other        Family Hx   Prostate cancer Other        Family Hx 1st Degree relative <50   Stroke Other        First Degree Female <50   Colon cancer Neg Hx    Esophageal cancer Neg Hx    Pancreatic cancer Neg Hx    Stomach cancer Neg Hx    Liver disease Neg Hx     Current Outpatient Medications (Endocrine & Metabolic):    metFORMIN (GLUCOPHAGE) 500 MG tablet, TAKE ONE TABLET BY MOUTH TWICE A DAY WITH MEALS   predniSONE (DELTASONE) 20 MG tablet, Take 2 tablets (40 mg total) by mouth daily with breakfast.  Current Outpatient Medications (Cardiovascular):    amLODipine (NORVASC) 10 MG tablet, Take 1 tablet (10 mg total) by mouth daily.    atorvastatin (LIPITOR) 20 MG tablet, Take 1 tablet (20 mg total) by mouth daily.   lisinopril (ZESTRIL) 20 MG tablet, TAKE ONE TABLET BY MOUTH TWICE A DAY  Current Outpatient Medications (Respiratory):    albuterol (VENTOLIN HFA) 108 (90 Base) MCG/ACT inhaler, Inhale 2 puffs into the lungs every  4 (four) hours as needed for wheezing or shortness of breath.    Current Outpatient Medications (Other):    blood glucose meter kit and supplies, Dispense based on patient and insurance preference. Test once a day. E11.9   cholecalciferol (VITAMIN D) 1000 UNITS tablet, Take 1,000 Units by mouth daily.    gabapentin (NEURONTIN) 100 MG capsule, Take 2 capsules (200 mg total) by mouth at bedtime.   linaclotide (LINZESS) 145 MCG CAPS capsule, Take 1 capsule (145 mcg total) by mouth daily before breakfast.   MIRALAX 17 GM/SCOOP powder, 1 dose in 8 ounces of water 1 to 2 times each day   Multiple Vitamins-Minerals (MULTIVITAMIN WITH MINERALS) tablet, Take 1 tablet by mouth daily.   ondansetron (ZOFRAN) 8 MG tablet, TAKE ONE TABLET BY MOUTH EVERY 6 HOURS AS NEEDED FOR NAUSEA/VOMITING   phentermine 15 MG capsule, Take 15 mg by mouth every morning.   potassium chloride (KLOR-CON) 10 MEQ tablet, TAKE TWO TABLETS BY MOUTH DAILY   valACYclovir (VALTREX) 500 MG tablet, TAKE ONE TABLET BY MOUTH TWICE A DAY AS NEEDED FOR OUTBREAKS   Reviewed prior external information including notes and imaging from  primary care provider As well as notes that were available from care everywhere and other healthcare systems.  Past medical history, social, surgical and family history all reviewed in electronic medical record.  Allen pertanent information unless stated regarding to the chief complaint.   Review of Systems:  Allen headache, visual changes, nausea, vomiting, diarrhea, constipation, dizziness, abdominal pain, skin rash, fevers, chills, night sweats, weight loss, swollen lymph nodes, body aches, joint swelling, chest pain,  shortness of breath, mood changes. POSITIVE muscle aches  Objective  Blood pressure 118/80, pulse (!) 102, height 5' 3"  (1.6 m), weight 173 lb (78.5 kg), last menstrual period 10/18/2015, SpO2 97 %.   General: Allen apparent distress alert and oriented x3 mood and affect normal, dressed appropriately.  HEENT: Pupils equal, extraocular movements intact  Respiratory: Patient's speak in full sentences and does not appear short of breath  Cardiovascular: Allen lower extremity edema, non tender, Allen erythema  Gait normal with good balance and coordination.  MSK: Right foot and ankle exam shows the patient does have overpronation of the hindfoot.  Tender to palpation over the medial calcaneal area.  Allen pain on the plantar aspect.  Good range of motion of the ankle noted.  Neurovascular intact distally.    \Limited muscular skeletal ultrasound was performed and interpreted by [name], M  ultrasound shows the patient does have very mild dilation of the plantar fascia.  Patient does have a mild calcaneal spur noted.  Patient does have irritation and increased Doppler flow surrounding the plantar nerve.  Posterior tibialis tendon seems to be unremarkable.  Mild calcific changes noted of the Achilles tendon with Allen increase in neovascularization or Doppler flow. Impression: Mild plantar fasciitis with likely plantar nerve irritation.    Impression and Recommendations:     The above documentation has been reviewed and is accurate and complete Lyndal Pulley, DO

## 2021-11-16 NOTE — Progress Notes (Signed)
ASSESSMENT AND PLAN    # 60 year old female with chronic constipation defined as decreased frequency  of urges.  Amitiza started a few weeks ago had no durable effect and adding  Miralax wasn't helpful. Averaging only 2 BMs / month, last one was a week ago  --TSH normal in October 2022 --Purge bowels today (Thursday)with Miralax purge plus Doculax 15 mg.  --Then on Saturday start Linzess 145 mcg 30 minutes before breakfast.  --Stop Amitiza. Stop daily Mirialax --Continue with good hydration. She drinks a minimum of 64 oz water / day --Call us in two weeks with condition update. If no improvement consider sitz marker study to evaluate for slow transit.  --Still difficult to sort out bloating in setting of such severe constipation. If bloating doesn't improve with treatment of constipation then consider SIBO testing ( ?methane producer).   # Heartburn / belching . Symptoms had significantly improved on Pepcid ( monotherapy) but now recurrent symptoms off Pepcid for a few weeks.  --Previous tried Omeprazole but doesn't feel it helped so will not resume it for now --Resume Pepcid 20 mg before breakfast.  --Continue anti-reflux measure --She will call us in a few weeks, if not improved then should like get an EGD.    HISTORY OF PRESENT ILLNESS    Chief Complaint : follow up on constipation, heartburn, belching and bloating  [name] is a 60 y.o. female known to Dr. Hilarie Fredrickson with a past medical history  of hypertension, asthma, OSA, diabetes , HDL, depression, anxiety. Additional medical history as listed in [place]   Patient was last seen 10/23/21 for evaluation of chronic intermittent nausea, heartburn, bloating, abdominal constipation, abdominal discomfort.  Please refer to the office note for further details.  It was felt that some if not most of her symptoms could be due to constipation. Plan was to treat constipation.  MiraLAX was discontinued, Amitiza prescribed.  If bloating did not  improve with resolution of constipation then plan was possible SIBO testing and celiac labs ( low suspicion).  For heartburn she was continued on omeprazole before breakfast, famotidine added at nighttime and antireflux measures discussed.  Plan was for EGD if symptoms persisted.  Patient called back within 1 week to say Amitiza was not working.  Trial of Linzess was suggested versus adding MiraLAX to Amitiza.  She opted to add MiraLAX before moving onto Linzess.  She is here for follow-up  INTERVAL HISTORY : [name] has CHRONIC constipation characterized by severely diminished frequency of urges.  She does feel like the constipation has gotten worse over the last few years. Taking Miralax 1-2 times a day and Amitiza twice daily. She had a large BM on Thanksgiving day ( one week ago) but no BM since . She is averaging 2 BMs a month. She drinks at least 64 ounces of water a day. BM on Thanksgiving day was black. No bismuth use.  No recent NSAID use and she has never taken NSAIDS on a routine basis.   Since I last saw her on 11/7 [name] started Pepcid, she didn't think Prilosec helped. The nausea and belching improved with Pepcid but now with recurrent symptoms off Pepcid for a couple of weeks. Feels pressure in her chest. She describes the belching and non-radiating pressure sensation as "indigestion" but symptoms are not necessarily related to eating no are they activity related. No associated SHOB.   PREVIOUS ENDOSCOPIC EVALUATIONS / PERTINENT STUDIES:   Colonoscopy 2016 - normal  Current Medications, Allergies, Past Medical History, Past Surgical History, Family History and Social History were reviewed in [employer] record.     Current Outpatient Medications  Medication Sig Dispense Refill   albuterol (VENTOLIN HFA) 108 (90 Base) MCG/ACT inhaler Inhale 2 puffs into the lungs every 4 (four) hours as needed for wheezing or shortness of breath. 18 g 2   amLODipine (NORVASC)  10 MG tablet Take 1 tablet (10 mg total) by mouth daily. 90 tablet 3   atorvastatin (LIPITOR) 20 MG tablet Take 1 tablet (20 mg total) by mouth daily. 90 tablet 3   blood glucose meter kit and supplies Dispense based on patient and insurance preference. Test once a day. E11.9 1 each 0   cholecalciferol (VITAMIN D) 1000 UNITS tablet Take 1,000 Units by mouth daily.      lisinopril (ZESTRIL) 20 MG tablet TAKE ONE TABLET BY MOUTH TWICE A DAY 180 tablet 0   lubiprostone (AMITIZA) 8 MCG capsule Take 1 capsule (8 mcg total) by mouth 2 (two) times daily with a meal. 60 capsule 3   metFORMIN (GLUCOPHAGE) 500 MG tablet TAKE ONE TABLET BY MOUTH TWICE A DAY WITH MEALS 180 tablet 1   MIRALAX 17 GM/SCOOP powder 1 dose in 8 ounces of water 1 to 2 times each day 255 g 0   Multiple Vitamins-Minerals (MULTIVITAMIN WITH MINERALS) tablet Take 1 tablet by mouth daily.     ondansetron (ZOFRAN) 8 MG tablet TAKE ONE TABLET BY MOUTH EVERY 6 HOURS AS NEEDED FOR NAUSEA/VOMITING 60 tablet 3   phentermine 15 MG capsule Take 15 mg by mouth every morning.     potassium chloride (KLOR-CON) 10 MEQ tablet TAKE TWO TABLETS BY MOUTH DAILY 180 tablet 3   valACYclovir (VALTREX) 500 MG tablet TAKE ONE TABLET BY MOUTH TWICE A DAY AS NEEDED FOR OUTBREAKS 30 tablet 4   No current facility-administered medications for this visit.    Review of Systems: No nausea / vomiting. No shortness of breath. No urinary complaints.   PHYSICAL EXAM :    Wt Readings from Last 3 Encounters:  11/16/21 174 lb 2 oz (79 kg)  10/23/21 170 lb 6.4 oz (77.3 kg)  10/05/21 160 lb (72.6 kg)    BP 104/72   Pulse 78   Ht 5' 3"  (1.6 m)   Wt 174 lb 2 oz (79 kg)   LMP 10/18/2015   SpO2 98%   BMI 30.84 kg/m  Constitutional:  Generally well appearing female in no acute distress. Psychiatric: Pleasant. Normal mood and affect. Behavior is normal. EENT: Pupils normal.  Conjunctivae are normal. No scleral icterus. Neck supple.  Cardiovascular: Normal  rate, regular rhythm. No edema Pulmonary/chest: Effort normal and breath sounds normal. No wheezing, rales or rhonchi. Abdominal: Soft, nondistended, nontender. Bowel sounds active throughout. There are no masses palpable. No hepatomegaly. Rectal:  Anal vault empty Neurological: Alert and oriented to person place and time. Skin: Skin is warm and dry. No rashes noted.  [name], NP  11/16/2021, 9:42 AM

## 2021-11-16 NOTE — Patient Instructions (Addendum)
If you are age 58 or younger, your body mass index should be between 19-25. Your Body mass index is 30.84 kg/m. If this is out of the aformentioned range listed, please consider follow up with your Primary Care Provider.   RECOMMENDATIONS: Stop Amitiza. Stop taking Miralax daily. Start Pepcid 20 Mg tablet once a day before breakfast. Start Linzess 145 mcg once a day in the morning on an empty stomach. This has been sent to the pharmacy.  [name], NP recommends that you complete a bowel purge (to clean out your bowels). Please do the following: Purchase a bottle of Miralax over the counter as well as a box of 5 mg dulcolax tablets. Take 4 dulcolax tablets. Wait 1 hour. You will then drink 6-8 capfuls of Miralax mixed in an adequate amount of water/juice/gatorade (you may choose which of these liquids to drink) over the next 2-3 hours. You should expect results within 1 to 6 hours after completing the bowel purge.  Contact us in 2 weeks with an update.  It was great seeing you today! Thank you for entrusting me with your care and choosing [facility].  [name], NP  The [facility] providers would like to encourage you to use [facility] to communicate with providers for non-urgent requests or questions.  Due to long hold times on the telephone, sending your provider a message by [facility] may be faster and more efficient way to get a response. Please allow 48 business hours for a response.  Please remember that this is for non-urgent requests/questions.

## 2021-11-17 ENCOUNTER — Ambulatory Visit (INDEPENDENT_AMBULATORY_CARE_PROVIDER_SITE_OTHER)
Admission: RE | Admit: 2021-11-17 | Discharge: 2021-11-17 | Disposition: A | Discharge status: Home / Self Care | Payer: BC Managed Care – PPO | Source: Ambulatory Visit | Attending: Family Medicine | Admitting: Family Medicine

## 2021-11-17 ENCOUNTER — Other Ambulatory Visit: Payer: Self-pay

## 2021-11-17 ENCOUNTER — Ambulatory Visit (INDEPENDENT_AMBULATORY_CARE_PROVIDER_SITE_OTHER): Payer: BC Managed Care – PPO | Admitting: Family Medicine

## 2021-11-17 ENCOUNTER — Encounter: Payer: Self-pay | Admitting: Family Medicine

## 2021-11-17 ENCOUNTER — Ambulatory Visit: Payer: Self-pay

## 2021-11-17 VITALS — BP 118/80 | HR 102 | Ht 63.0 in | Wt 173.0 lb

## 2021-11-17 DIAGNOSIS — M79671 Pain in right foot: Secondary | ICD-10-CM | POA: Diagnosis not present

## 2021-11-17 DIAGNOSIS — M216X9 Other acquired deformities of unspecified foot: Secondary | ICD-10-CM | POA: Diagnosis not present

## 2021-11-17 MED ORDER — PREDNISONE 20 MG PO TABS: 40.0000 mg | ORAL_TABLET | Freq: Every day | ORAL | 0 refills | Status: DC

## 2021-11-17 MED ORDER — GABAPENTIN 100 MG PO CAPS: 200.0000 mg | ORAL_CAPSULE | Freq: Every day | ORAL | 0 refills | Status: DC

## 2021-11-17 NOTE — Patient Instructions (Addendum)
Prednisone 40mg  for 5 days Gabapentin 200mg  Hoka or Oofos Recovery sandals DO NOT be barefoot Spenco total orthotics Ankle xray at [facility] See you again in 5-6 weeks, if not better will need to inject

## 2021-11-17 NOTE — Assessment & Plan Note (Signed)
Patient does have more of a pronation deformity of the ankle noted.  Patient on ultrasound today does not show any significant inflammation of the plantar fascia and history seems to be more secondary to either a heel contusion versus the possibility of a plantar nerve injury.  Discussed gabapentin, prednisone, heel lift and over-the-counter orthotics.  We discussed also the recovery sandals that I think will be beneficial.  Discussed which activities to do which wants to avoid.  Increase activity slowly.  Follow-up with me again 6 to 8 weeks.

## 2021-11-30 ENCOUNTER — Other Ambulatory Visit: Payer: Self-pay

## 2021-12-04 ENCOUNTER — Telehealth: Payer: Self-pay | Admitting: Family Medicine

## 2021-12-04 NOTE — Telephone Encounter (Signed)
Patient calling in with respiratory symptoms: Shortness of breath, chest pain, palpitations or other red words send to Triage  Does the patient have a fever over 100, cough, congestion, sore throat, runny nose, lost of taste/smell (please list symptoms that patient has)?coughing that causes vomiting.  What date did symptoms start?pt states three weeks ago  (If over 5 days ago, pt may be scheduled for in person visit)  Have you tested for Covid in the last 5 days? No  tested on 12/11  If yes, was it positive OR negative [x] ? If positive in the last 5 days, please schedule virtual visit now. If negative, schedule for an in person OV with the next available provider if PCP has no openings. Please also let patient know they will be tested again (follow the script below)  "you will have to arrive 91mins prior to your appt time to be Covid tested. Please park in back of office at the cone & call [phone] to let the staff know you have arrived. A staff member will meet you at your car to do a rapid covid test. Once the test has resulted you will be notified by phone of your results to determine if appt will remain an in person visit or be converted to a virtual/phone visit. If you arrive less than 69mins before your appt time, your visit will be automatically converted to virtual & any recommended testing will happen AFTER the visit."   [place]  If no availability for virtual visit in office,  please schedule another [place] office  If no availability at another [place] office, please instruct patient that they can schedule an evisit or virtual visit through their mychart account. Visits up to 8pm  patients can be seen in office 5 days after positive COVID test

## 2021-12-05 ENCOUNTER — Other Ambulatory Visit: Payer: Self-pay

## 2021-12-05 ENCOUNTER — Ambulatory Visit (INDEPENDENT_AMBULATORY_CARE_PROVIDER_SITE_OTHER): Payer: BC Managed Care – PPO | Admitting: Family Medicine

## 2021-12-05 ENCOUNTER — Ambulatory Visit (INDEPENDENT_AMBULATORY_CARE_PROVIDER_SITE_OTHER): Payer: BC Managed Care – PPO

## 2021-12-05 VITALS — BP 122/70 | HR 92 | Temp 98.2°F | Wt 165.0 lb

## 2021-12-05 DIAGNOSIS — R051 Acute cough: Secondary | ICD-10-CM

## 2021-12-05 DIAGNOSIS — Z23 Encounter for immunization: Secondary | ICD-10-CM

## 2021-12-05 LAB — POC INFLUENZA A&B (BINAX/QUICKVUE)
Influenza A, POC: NEGATIVE
Influenza B, POC: NEGATIVE

## 2021-12-05 LAB — POC COVID19 BINAXNOW: SARS Coronavirus 2 Ag: NEGATIVE

## 2021-12-05 MED ORDER — HYDROCODONE BIT-HOMATROP MBR 5-1.5 MG/5ML PO SOLN: 5.0000 mL | Freq: Three times a day (TID) | ORAL | 0 refills | Status: DC | PRN

## 2021-12-05 MED ORDER — DOXYCYCLINE HYCLATE 100 MG PO CAPS: 100.0000 mg | ORAL_CAPSULE | Freq: Two times a day (BID) | ORAL | 0 refills | Status: DC

## 2021-12-05 NOTE — Progress Notes (Signed)
Established Patient Office Visit  Subjective:  Patient ID: [name], female    DOB: 1961/02/28  Age: 60 y.o. MRN: [id number]  CC:  Chief Complaint  Patient presents with   Cough    Cough and congestion x 3 weeks, some chest discomfort     HPI [name] presents for few weeks.  She states this started as a "cold ".  She wonders in retrospect if she may have had influenza.  She did have some fever for about a day or 2.  She initially had some sore throat, body aches, headache, cough, nasal congestion.  Other symptoms have improved and overall she feels better but still has productive cough.  No hemoptysis.  No dyspnea.  No pleuritic pain.  She received flu vaccine last January but not this season.  Recent home COVID test negative.  She smoked only briefly back in her 45s.  No recent appetite or weight changes.  Denies any facial pain.  Recent cough productive of green to brown sputum.  Her cough has been especially bothersome at night and interfering with sleep.  Past Medical History:  Diagnosis Date   Allergy    Dr Stevphen [place]   Cardiac arrhythmia    Depression    Diabetes mellitus without complication [facility])    Fibroids    uterine sees Dr. Neva Seat in [place]    Genital warts    Heart murmur    High cholesterol    Hypertension    Irritable bowel syndrome (IBS)    Low back pain    has a herniated lumbar disc sees Dr August Saucer    Past Surgical History:  Procedure Laterality Date   ABDOMINOPLASTY  04/2013   BREAST BIOPSY Right 06/2014   COLONOSCOPY  02/10/2015   per Dr. Rhea Belton, clear, repeat in 10 yrs    LUMBAR LAMINECTOMY  2010   L3-4 on 02-24-10 per Dr Otelia Sergeant    MYOMECTOMY     laser ablation of uterine fibroids per Dr Dareen Piano 2000   TUBAL LIGATION      Family History  Problem Relation Age of Onset   Diabetes Mother    Hypertension Mother    Diabetes Father    Hypertension Father    Heart disease Father    Breast cancer Sister    Diabetes Maternal  Grandmother    Hypertension Maternal Grandmother    Diabetes Maternal Grandfather    Hypertension Maternal Grandfather    Diabetes Paternal Grandmother    Hypertension Paternal Grandmother    Diabetes Paternal Grandfather    Hypertension Paternal Grandfather    Arthritis Other        Family Hx   Diabetes Other        Family Hx 1st degree relative   Hyperlipidemia Other        Family Hx   Hypertension Other        Family Hx   Prostate cancer Other        Family Hx 1st Degree relative <50   Stroke Other        First Degree Female <50   Colon cancer Neg Hx    Esophageal cancer Neg Hx    Pancreatic cancer Neg Hx    Stomach cancer Neg Hx    Liver disease Neg Hx     Social History   Socioeconomic History   Marital status: Married    Spouse name: Not on file   Number of children: 4   Years of  education: Not on file   Highest education level: Some college, no degree  Occupational History   Occupation: Teacher  Tobacco Use   Smoking status: Former    Packs/day: 0.50    Years: 1.00    Pack years: 0.50    Types: Cigarettes    Quit date: 12/17/1996    Years since quitting: 24.9   Smokeless tobacco: Never  Vaping Use   Vaping Use: Never used  Substance and Sexual Activity   Alcohol use: Yes    Alcohol/week: 0.0 standard drinks    Comment: occ   Drug use: No   Sexual activity: Yes    Birth control/protection: Surgical  Other Topics Concern   Not on file  Social History Narrative   Not on file   Social Determinants of Health   Financial Resource Strain: Low Risk    Difficulty of Paying Living Expenses: Not hard at all  Food Insecurity: No Food Insecurity   Worried About Programme researcher, broadcasting/film/video in the Last Year: Never true   Ran Out of Food in the Last Year: Never true  Transportation Needs: No Transportation Needs   Lack of Transportation (Medical): No   Lack of Transportation (Non-Medical): No  Physical Activity: Sufficiently Active   Days of Exercise per Week: 6 days    Minutes of Exercise per Session: 30 min  Stress: Stress Concern Present   Feeling of Stress : To some extent  Social Connections: Press photographer of Communication with Friends and Family: More than three times a week   Frequency of Social Gatherings with Friends and Family: Once a week   Attends Religious Services: More than 4 times per year   Active Member of [employer] or Organizations: Yes   Attends Engineer, structural: More than 4 times per year   Marital Status: Married  Catering manager Violence: Not on file    Outpatient Medications Prior to Visit  Medication Sig Dispense Refill   albuterol (VENTOLIN HFA) 108 (90 Base) MCG/ACT inhaler Inhale 2 puffs into the lungs every 4 (four) hours as needed for wheezing or shortness of breath. 18 g 2   amLODipine (NORVASC) 10 MG tablet Take 1 tablet (10 mg total) by mouth daily. 90 tablet 3   atorvastatin (LIPITOR) 20 MG tablet Take 1 tablet (20 mg total) by mouth daily. 90 tablet 3   blood glucose meter kit and supplies Dispense based on patient and insurance preference. Test once a day. E11.9 1 each 0   cholecalciferol (VITAMIN D) 1000 UNITS tablet Take 1,000 Units by mouth daily.      gabapentin (NEURONTIN) 100 MG capsule Take 2 capsules (200 mg total) by mouth at bedtime. 60 capsule 0   linaclotide (LINZESS) 145 MCG CAPS capsule Take 1 capsule (145 mcg total) by mouth daily before breakfast. 30 capsule 3   lisinopril (ZESTRIL) 20 MG tablet TAKE ONE TABLET BY MOUTH TWICE A DAY 180 tablet 0   metFORMIN (GLUCOPHAGE) 500 MG tablet TAKE ONE TABLET BY MOUTH TWICE A DAY WITH MEALS 180 tablet 1   MIRALAX 17 GM/SCOOP powder 1 dose in 8 ounces of water 1 to 2 times each day 255 g 0   Multiple Vitamins-Minerals (MULTIVITAMIN WITH MINERALS) tablet Take 1 tablet by mouth daily.     ondansetron (ZOFRAN) 8 MG tablet TAKE ONE TABLET BY MOUTH EVERY 6 HOURS AS NEEDED FOR NAUSEA/VOMITING 60 tablet 3   phentermine 15 MG capsule Take 15  mg by mouth every  morning.     potassium chloride (KLOR-CON) 10 MEQ tablet TAKE TWO TABLETS BY MOUTH DAILY 180 tablet 3   predniSONE (DELTASONE) 20 MG tablet Take 2 tablets (40 mg total) by mouth daily with breakfast. 10 tablet 0   valACYclovir (VALTREX) 500 MG tablet TAKE ONE TABLET BY MOUTH TWICE A DAY AS NEEDED FOR OUTBREAKS 30 tablet 4   No facility-administered medications prior to visit.    Allergies  Allergen Reactions   Iodinated Diagnostic Agents Swelling    Tongue swelling   Iodine Swelling    Tongue swelling   Peanut-Containing Drug Products Anaphylaxis   Hctz [Hydrochlorothiazide] Other (See Comments)    Blood pressure goes up   Shellfish-Derived Products Other (See Comments)     Per allergy test   Cephalexin Rash   Ciprofloxacin Diarrhea    ROS Review of Systems  Constitutional:  Negative for appetite change, chills, fever and unexpected weight change.  HENT:  Negative for sinus pressure, sinus pain and sore throat.   Respiratory:  Positive for cough. Negative for shortness of breath and wheezing.   Cardiovascular:  Negative for chest pain.     Objective:    Physical Exam Vitals reviewed.  Constitutional:      Appearance: Normal appearance.  HENT:     Right Ear: Tympanic membrane normal.     Left Ear: Tympanic membrane normal.     Mouth/Throat:     Mouth: Mucous membranes are moist.     Pharynx: Oropharynx is clear. No oropharyngeal exudate or posterior oropharyngeal erythema.  Cardiovascular:     Rate and Rhythm: Normal rate and regular rhythm.  Pulmonary:     Effort: Pulmonary effort is normal.     Breath sounds: Normal breath sounds. No wheezing or rales.  Musculoskeletal:     Cervical back: Neck supple.  Lymphadenopathy:     Cervical: No cervical adenopathy.  Neurological:     Mental Status: She is alert.    BP 122/70 (BP Location: Left Arm, Patient Position: Sitting, Cuff Size: Normal)    Pulse 92    Temp 98.2 F (36.8 C) (Oral)    Wt 165 lb  (74.8 kg)    LMP 10/18/2015    SpO2 98%    BMI 29.23 kg/m  Wt Readings from Last 3 Encounters:  12/05/21 165 lb (74.8 kg)  11/17/21 173 lb (78.5 kg)  11/16/21 174 lb 2 oz (79 kg)     Health Maintenance Due  Topic Date Due   COVID-19 Vaccine (1) Never done   FOOT EXAM  Never done   Pneumococcal Vaccine 94-35 Years old (2 - PCV) 09/06/2019   OPHTHALMOLOGY EXAM  04/10/2020   INFLUENZA VACCINE  07/17/2021    There are no preventive care reminders to display for this patient.  Lab Results  Component Value Date   TSH 1.02 10/03/2021   Lab Results  Component Value Date   WBC 7.8 10/03/2021   HGB 13.3 10/03/2021   HCT 40.6 10/03/2021   MCV 88.4 10/03/2021   PLT 374.0 10/03/2021   Lab Results  Component Value Date   NA 140 10/03/2021   K 3.8 10/03/2021   CO2 28 10/03/2021   GLUCOSE 129 (H) 10/03/2021   BUN 18 10/03/2021   CREATININE 0.90 10/03/2021   BILITOT 0.9 10/03/2021   ALKPHOS 117 10/03/2021   AST 17 10/03/2021   ALT 14 10/03/2021   PROT 6.8 10/03/2021   ALBUMIN 4.4 10/03/2021   CALCIUM 9.3 10/03/2021   ANIONGAP 13  11/30/2019   GFR 70.39 10/03/2021   Lab Results  Component Value Date   CHOL 139 05/02/2021   Lab Results  Component Value Date   HDL 42.50 05/02/2021   Lab Results  Component Value Date   LDLCALC 65 05/02/2021   Lab Results  Component Value Date   TRIG 156.0 (H) 05/02/2021   Lab Results  Component Value Date   CHOLHDL 3 05/02/2021   Lab Results  Component Value Date   HGBA1C 6.5 10/03/2021      Assessment & Plan:   Patient presents with persistent cough following URI type illness about 3 weeks ago.  We discussed the fact she very well may have had influenza A.  She is overall improved but still has productive cough.  She is requesting chest x-ray.  Does not have any red flags such as hemoptysis or dyspnea.  -Obtain PA and lateral chest x-ray -Influenza vaccine given -We gave her a limited supply of Hycodan cough syrup 1  teaspoon nightly for severe cough -Decided given duration of productive cough to start doxycycline 100 mg twice daily for 10 days   Meds ordered this encounter  Medications   doxycycline (VIBRAMYCIN) 100 MG capsule    Sig: Take 1 capsule (100 mg total) by mouth 2 (two) times daily.    Dispense:  20 capsule    Refill:  0   HYDROcodone bit-homatropine (HYCODAN) 5-1.5 MG/5ML syrup    Sig: Take 5 mLs by mouth every 8 (eight) hours as needed for cough.    Dispense:  120 mL    Refill:  0    Follow-up: No follow-ups on file.    [name], MD

## 2021-12-06 ENCOUNTER — Encounter: Payer: Self-pay | Admitting: Family Medicine

## 2021-12-06 NOTE — Telephone Encounter (Signed)
This refers to the haziness in the Xray, which is typical of a viral lung infection. There is no pneumonia. She should finish up the antibiotic as directed

## 2021-12-06 NOTE — Telephone Encounter (Signed)
Please advise. She would like to know what interstitial opacity means and what she should do about it.

## 2021-12-06 NOTE — Progress Notes (Signed)
[facility] [place] Phone: [phone] Subjective:   [name], am serving as a scribe for Dr. Hulan Saas.This visit occurred during the SARS-CoV-2 public health emergency.  Safety protocols were in place, including screening questions prior to the visit, additional usage of staff PPE, and extensive cleaning of exam room while observing appropriate contact time as indicated for disinfecting solutions.  I'm seeing this patient by the request  of:  Laurey Morale, MD  CC: Foot pain follow-up  CHY:[id number]  12/2/20022 Patient does have more of a pronation deformity of the ankle noted.  Patient on ultrasound today does not show any significant inflammation of the plantar fascia and history seems to be more secondary to either a heel contusion versus the possibility of a plantar nerve injury.  Discussed gabapentin, prednisone, heel lift and over-the-counter orthotics.  We discussed also the recovery sandals that I think will be beneficial.  Discussed which activities to do which wants to avoid.  Increase activity slowly.  Follow-up with me again 6 to 8 weeks.  Update 12/12/2021 [name] is a 60 y.o. female coming in with complaint of R heel pain. Patient states that she continues to have pain when barefoot. Feels about the same as last visit. Is not having sharp pain but pain is constant. Has been doing stretches for calf as well.  Patient has noticed of starting to have more of the left hip pain again.  Has been seen previously for more of a lumbar radiculopathy as well as a trochanteric bursitis.  Patient states that she would like to make sure that this does not come back.  If anything feels like some of the pain in the foot has worsening in certain areas.       Past Medical History:  Diagnosis Date   Allergy    Dr Caprice Red   Cardiac arrhythmia    Depression    Diabetes mellitus without complication [facility])     Fibroids    uterine sees Dr. Carlota Raspberry in [place]    Genital warts    Heart murmur    High cholesterol    Hypertension    Irritable bowel syndrome (IBS)    Low back pain    has a herniated lumbar disc sees Dr Marlou Sa   Past Surgical History:  Procedure Laterality Date   ABDOMINOPLASTY  04/2013   BREAST BIOPSY Right 06/2014   COLONOSCOPY  02/10/2015   per Dr. Hilarie Fredrickson, clear, repeat in 10 yrs    LUMBAR LAMINECTOMY  2010   L3-4 on 02-24-10 per Dr Louanne Skye    MYOMECTOMY     laser ablation of uterine fibroids per Dr Ouida Sills 2000   TUBAL LIGATION     Social History   Socioeconomic History   Marital status: Married    Spouse name: Not on file   Number of children: 4   Years of education: Not on file   Highest education level: Some college, no degree  Occupational History   Occupation: Pharmacist, hospital  Tobacco Use   Smoking status: Former    Packs/day: 0.50    Years: 1.00    Pack years: 0.50    Types: Cigarettes    Quit date: 12/17/1996    Years since quitting: 25.0   Smokeless tobacco: Never  Vaping Use   Vaping Use: Never used  Substance and Sexual Activity   Alcohol use: Yes    Alcohol/week: 0.0 standard drinks  Comment: occ   Drug use: No   Sexual activity: Yes    Birth control/protection: Surgical  Other Topics Concern   Not on file  Social History Narrative   Not on file   Social Determinants of Health   Financial Resource Strain: Low Risk    Difficulty of Paying Living Expenses: Not hard at all  Food Insecurity: No Food Insecurity   Worried About Charity fundraiser in the Last Year: Never true   [place] in the Last Year: Never true  Transportation Needs: No Transportation Needs   Lack of Transportation (Medical): No   Lack of Transportation (Non-Medical): No  Physical Activity: Sufficiently Active   Days of Exercise per Week: 6 days   Minutes of Exercise per Session: 30 min  Stress: Stress Concern Present   Feeling of Stress : To some extent  Social  Connections: Engineer, building services of Communication with Friends and Family: More than three times a week   Frequency of Social Gatherings with Friends and Family: Once a week   Attends Religious Services: More than 4 times per year   Active Member of [employer] or Organizations: Yes   Attends Music therapist: More than 4 times per year   Marital Status: Married   Allergies  Allergen Reactions   Iodinated Contrast Media Swelling    Tongue swelling   Iodine Swelling    Tongue swelling   Peanut-Containing Drug Products Anaphylaxis   Hctz [Hydrochlorothiazide] Other (See Comments)    Blood pressure goes up   [employer] Other (See Comments)     Per allergy test   Cephalexin Rash   Ciprofloxacin Diarrhea   Family History  Problem Relation Age of Onset   Diabetes Mother    Hypertension Mother    Diabetes Father    Hypertension Father    Heart disease Father    Breast cancer Sister    Diabetes Maternal Grandmother    Hypertension Maternal Grandmother    Diabetes Maternal Grandfather    Hypertension Maternal Grandfather    Diabetes Paternal Grandmother    Hypertension Paternal Grandmother    Diabetes Paternal Grandfather    Hypertension Paternal Grandfather    Arthritis Other        Family Hx   Diabetes Other        Family Hx 1st degree relative   Hyperlipidemia Other        Family Hx   Hypertension Other        Family Hx   Prostate cancer Other        Family Hx 1st Degree relative <50   Stroke Other        First Degree Female <50   Colon cancer Neg Hx    Esophageal cancer Neg Hx    Pancreatic cancer Neg Hx    Stomach cancer Neg Hx    Liver disease Neg Hx     Current Outpatient Medications (Endocrine & Metabolic):    metFORMIN (GLUCOPHAGE) 500 MG tablet, TAKE ONE TABLET BY MOUTH TWICE A DAY WITH MEALS   predniSONE (DELTASONE) 20 MG tablet, Take 2 tablets (40 mg total) by mouth daily with breakfast.  Current Outpatient Medications  (Cardiovascular):    amLODipine (NORVASC) 10 MG tablet, Take 1 tablet (10 mg total) by mouth daily.   atorvastatin (LIPITOR) 20 MG tablet, Take 1 tablet (20 mg total) by mouth daily.   lisinopril (ZESTRIL) 20 MG tablet, TAKE ONE TABLET BY MOUTH TWICE  A DAY  Current Outpatient Medications (Respiratory):    albuterol (VENTOLIN HFA) 108 (90 Base) MCG/ACT inhaler, Inhale 2 puffs into the lungs every 4 (four) hours as needed for wheezing or shortness of breath.   HYDROcodone bit-homatropine (HYCODAN) 5-1.5 MG/5ML syrup, Take 5 mLs by mouth every 8 (eight) hours as needed for cough.    Current Outpatient Medications (Other):    blood glucose meter kit and supplies, Dispense based on patient and insurance preference. Test once a day. E11.9   cholecalciferol (VITAMIN D) 1000 UNITS tablet, Take 1,000 Units by mouth daily.    doxycycline (VIBRAMYCIN) 100 MG capsule, Take 1 capsule (100 mg total) by mouth 2 (two) times daily.   gabapentin (NEURONTIN) 100 MG capsule, Take 2 capsules (200 mg total) by mouth at bedtime.   linaclotide (LINZESS) 145 MCG CAPS capsule, Take 1 capsule (145 mcg total) by mouth daily before breakfast.   MIRALAX 17 GM/SCOOP powder, 1 dose in 8 ounces of water 1 to 2 times each day   Multiple Vitamins-Minerals (MULTIVITAMIN WITH MINERALS) tablet, Take 1 tablet by mouth daily.   ondansetron (ZOFRAN) 8 MG tablet, TAKE ONE TABLET BY MOUTH EVERY 6 HOURS AS NEEDED FOR NAUSEA/VOMITING   phentermine 15 MG capsule, Take 15 mg by mouth every morning.   potassium chloride (KLOR-CON) 10 MEQ tablet, TAKE TWO TABLETS BY MOUTH DAILY   valACYclovir (VALTREX) 500 MG tablet, TAKE ONE TABLET BY MOUTH TWICE A DAY AS NEEDED FOR OUTBREAKS   Reviewed prior external information including notes and imaging from  primary care provider As well as notes that were available from care everywhere and other healthcare systems.  Past medical history, social, surgical and family history all reviewed in  electronic medical record.  No pertanent information unless stated regarding to the chief complaint.   Review of Systems:  No headache, visual changes, nausea, vomiting, diarrhea, constipation, dizziness, abdominal pain, skin rash, fevers, chills, night sweats, weight loss, swollen lymph nodes, body aches, joint swelling, chest pain, shortness of breath, mood changes. POSITIVE muscle aches  Objective  Blood pressure 110/70, pulse 83, height _0  (1.6 m), weight 171 lb (77.6 kg), last menstrual period 10/18/2015, SpO2 97 %.   General: No apparent distress alert and oriented x3 mood and affect normal, dressed appropriately.  HEENT: Pupils equal, extraocular movements intact  Respiratory: Patient's speak in full sentences and does not appear short of breath  Cardiovascular: No lower extremity edema, non tender, no erythema  Gait normal with good balance and coordination.  MSK:  N right foot exam does have some breakdown of the transverse arch.  Patient does have pronation deformity with some mild overpronation of the hindfoot.  Tender to palpation over the medial calcaneal area.  Patient has good range of motion of the ankle.  No pain in the calf itself.  Limited muscular skeletal ultrasound was performed and interpreted by Hulan Saas, M  Limited musculoskeletal shows the patient does have some hypoechoic changes of the plantar fascia.  No true tearing appreciated.  Patient noted does have enlargement of the plantar fascia even more than previous exam.  No cortical irregularity noted of the calcaneal area. Impression: Plantar fasciitis   Impression and Recommendations:     The above documentation has been reviewed and is accurate and complete Lyndal Pulley, DO

## 2021-12-08 ENCOUNTER — Other Ambulatory Visit: Payer: Self-pay

## 2021-12-08 ENCOUNTER — Emergency Department (HOSPITAL_COMMUNITY): Payer: BC Managed Care – PPO

## 2021-12-08 ENCOUNTER — Encounter (HOSPITAL_COMMUNITY): Payer: Self-pay

## 2021-12-08 ENCOUNTER — Emergency Department (HOSPITAL_COMMUNITY)
Admission: EM | Admit: 2021-12-08 | Discharge: 2021-12-08 | Disposition: A | Discharge status: Home / Self Care | Payer: BC Managed Care – PPO | Attending: Emergency Medicine | Admitting: Emergency Medicine

## 2021-12-08 DIAGNOSIS — Z8616 Personal history of COVID-19: Secondary | ICD-10-CM | POA: Insufficient documentation

## 2021-12-08 DIAGNOSIS — Z7984 Long term (current) use of oral hypoglycemic drugs: Secondary | ICD-10-CM | POA: Diagnosis not present

## 2021-12-08 DIAGNOSIS — R079 Chest pain, unspecified: Secondary | ICD-10-CM | POA: Diagnosis present

## 2021-12-08 DIAGNOSIS — Z91010 Allergy to peanuts: Secondary | ICD-10-CM | POA: Diagnosis not present

## 2021-12-08 DIAGNOSIS — U071 COVID-19: Secondary | ICD-10-CM | POA: Diagnosis not present

## 2021-12-08 DIAGNOSIS — Z87891 Personal history of nicotine dependence: Secondary | ICD-10-CM | POA: Diagnosis not present

## 2021-12-08 DIAGNOSIS — I10 Essential (primary) hypertension: Secondary | ICD-10-CM | POA: Insufficient documentation

## 2021-12-08 DIAGNOSIS — E119 Type 2 diabetes mellitus without complications: Secondary | ICD-10-CM | POA: Insufficient documentation

## 2021-12-08 DIAGNOSIS — Z79899 Other long term (current) drug therapy: Secondary | ICD-10-CM | POA: Diagnosis not present

## 2021-12-08 DIAGNOSIS — J45909 Unspecified asthma, uncomplicated: Secondary | ICD-10-CM | POA: Diagnosis not present

## 2021-12-08 DIAGNOSIS — E860 Dehydration: Secondary | ICD-10-CM | POA: Diagnosis not present

## 2021-12-08 LAB — TROPONIN I (HIGH SENSITIVITY): Troponin I (High Sensitivity): 2 ng/L (ref <18)

## 2021-12-08 LAB — CBC
HCT: 44.2 % (ref 36.0–46.0)
Hemoglobin: 14.7 g/dL (ref 12.0–15.0)
MCH: 29.8 pg (ref 26.0–34.0)
MCHC: 33.3 g/dL (ref 30.0–36.0)
MCV: 89.5 fL (ref 80.0–100.0)
Platelets: 384 10*3/uL (ref 150–400)
RBC: 4.94 MIL/uL (ref 3.87–5.11)
RDW: 14.6 % (ref 11.5–15.5)
WBC: 9.9 10*3/uL (ref 4.0–10.5)
nRBC: 0 % (ref 0.0–0.2)

## 2021-12-08 LAB — RESP PANEL BY RT-PCR (FLU A&B, COVID) ARPGX2
Influenza A by PCR: NEGATIVE
Influenza B by PCR: NEGATIVE
SARS Coronavirus 2 by RT PCR: POSITIVE — AB

## 2021-12-08 LAB — HEPATIC FUNCTION PANEL
ALT: 18 U/L (ref 0–44)
AST: 15 U/L (ref 15–41)
Albumin: 3.6 g/dL (ref 3.5–5.0)
Alkaline Phosphatase: 63 U/L (ref 38–126)
Bilirubin, Direct: 0.2 mg/dL (ref 0.0–0.2)
Indirect Bilirubin: 1.1 mg/dL — ABNORMAL HIGH (ref 0.3–0.9)
Total Bilirubin: 1.3 mg/dL — ABNORMAL HIGH (ref 0.3–1.2)
Total Protein: 5.5 g/dL — ABNORMAL LOW (ref 6.5–8.1)

## 2021-12-08 LAB — BASIC METABOLIC PANEL
Anion gap: 4 — ABNORMAL LOW (ref 5–15)
BUN: 19 mg/dL (ref 6–20)
CO2: 24 mmol/L (ref 22–32)
Calcium: 8.6 mg/dL — ABNORMAL LOW (ref 8.9–10.3)
Chloride: 109 mmol/L (ref 98–111)
Creatinine, Ser: 0.97 mg/dL (ref 0.44–1.00)
GFR, Estimated: >60 mL/min (ref >60)
Glucose, Bld: 124 mg/dL — ABNORMAL HIGH (ref 70–99)
Potassium: 4.2 mmol/L (ref 3.5–5.1)
Sodium: 137 mmol/L (ref 135–145)

## 2021-12-08 MED ORDER — SODIUM CHLORIDE 0.9 % IV BOLUS
1000.0000 mL | Freq: Once | INTRAVENOUS | Status: AC
  Administered 2021-12-08: 10:00:00 1000 mL via INTRAVENOUS

## 2021-12-08 NOTE — Discharge Instructions (Signed)
Follow-up with your family doctor if any problems.  Drink plenty of fluids

## 2021-12-08 NOTE — ED Provider Notes (Signed)
[facility] DEPT Provider Note   CSN: [id number] Arrival date & time: 12/08/21  2536     History Chief Complaint  Patient presents with   Chest Pain   Hypotension    [name] is a 60 y.o. female.  Complains of intermittent chest pain and low blood pressure.  The history is provided by the patient and medical records. No language interpreter was used.  Chest Pain Pain location:  L chest Pain quality: aching   Pain radiates to:  Does not radiate Pain severity:  Mild Timing:  Constant Progression:  Waxing and waning Chronicity:  New Context: not breathing   Associated symptoms: no abdominal pain, no back pain, no cough, no fatigue and no headache       Past Medical History:  Diagnosis Date   Allergy    Dr Caprice Red   Cardiac arrhythmia    Depression    Diabetes mellitus without complication [facility])    Fibroids    uterine sees Dr. Carlota Raspberry in [place]    Genital warts    Heart murmur    High cholesterol    Hypertension    Irritable bowel syndrome (IBS)    Low back pain    has a herniated lumbar disc sees Dr Marlou Sa    Patient Active Problem List   Diagnosis Date Noted   Degenerative cervical disc 02/02/2021   Rotator cuff syndrome of right shoulder 12/07/2020   COVID-19 virus infection 09/20/2020   Well woman exam with routine gynecological exam 01/07/2020   Breast pain, right 01/07/2020   Depression with anxiety 10/21/2019   Hypercholesterolemia 01/19/2019   Left lumbar radiculopathy 12/25/2018   Right arm pain 11/26/2018   Low back pain 11/26/2018   Left knee pain 10/01/2018   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   Type 2 diabetes mellitus without complication, without long-term current use of insulin ([place]) 02/22/2017   Loss of transverse plantar arch 10/31/2016   Pronation deformity of ankle, acquired 10/31/2016   Patellofemoral pain syndrome 09/23/2015   OSA (obstructive sleep apnea) 03/31/2014   Asthma, chronic  08/14/2013   Leg edema 08/14/2013   Osteoarthritis 09/11/2010   GERD 03/17/2010   SCIATICA 08/30/2009   TROCHANTERIC BURSITIS 08/09/2009   LEG PAIN, BILATERAL 09/30/2007   Essential hypertension 09/22/2007    Past Surgical History:  Procedure Laterality Date   ABDOMINOPLASTY  04/2013   BREAST BIOPSY Right 06/2014   COLONOSCOPY  02/10/2015   per Dr. Hilarie Fredrickson, clear, repeat in 10 yrs    LUMBAR LAMINECTOMY  2010   L3-4 on 02-24-10 per Dr Louanne Skye    MYOMECTOMY     laser ablation of uterine fibroids per Dr Ouida Sills 2000   TUBAL LIGATION       OB History     Gravida  6   Para  4   Term  4   Preterm      AB  2   Living  4      SAB      IAB      Ectopic      Multiple      Live Births              Family History  Problem Relation Age of Onset   Diabetes Mother    Hypertension Mother    Diabetes Father    Hypertension Father    Heart disease Father    Breast cancer Sister    Diabetes Maternal Grandmother    Hypertension  Maternal Grandmother    Diabetes Maternal Grandfather    Hypertension Maternal Grandfather    Diabetes Paternal Grandmother    Hypertension Paternal Grandmother    Diabetes Paternal Grandfather    Hypertension Paternal Grandfather    Arthritis Other        Family Hx   Diabetes Other        Family Hx 1st degree relative   Hyperlipidemia Other        Family Hx   Hypertension Other        Family Hx   Prostate cancer Other        Family Hx 1st Degree relative <50   Stroke Other        First Degree Female <50   Colon cancer Neg Hx    Esophageal cancer Neg Hx    Pancreatic cancer Neg Hx    Stomach cancer Neg Hx    Liver disease Neg Hx     Social History   Tobacco Use   Smoking status: Former    Packs/day: 0.50    Years: 1.00    Pack years: 0.50    Types: Cigarettes    Quit date: 12/17/1996    Years since quitting: 24.9   Smokeless tobacco: Never  Vaping Use   Vaping Use: Never used  Substance Use Topics   Alcohol use: Yes     Alcohol/week: 0.0 standard drinks    Comment: occ   Drug use: No    Home Medications Prior to Admission medications   Medication Sig Start Date End Date Taking? Authorizing Provider  albuterol (VENTOLIN HFA) 108 (90 Base) MCG/ACT inhaler Inhale 2 puffs into the lungs every 4 (four) hours as needed for wheezing or shortness of breath. 09/20/20   Laurey Morale, MD  amLODipine (NORVASC) 10 MG tablet Take 1 tablet (10 mg total) by mouth daily. 05/02/21   Laurey Morale, MD  atorvastatin (LIPITOR) 20 MG tablet Take 1 tablet (20 mg total) by mouth daily. 05/02/21   Laurey Morale, MD  blood glucose meter kit and supplies Dispense based on patient and insurance preference. Test once a day. E11.9 05/04/20   Laurey Morale, MD  cholecalciferol (VITAMIN D) 1000 UNITS tablet Take 1,000 Units by mouth daily.     [provider]  doxycycline (VIBRAMYCIN) 100 MG capsule Take 1 capsule (100 mg total) by mouth 2 (two) times daily. 12/05/21   Burchette, Alinda Sierras, MD  gabapentin (NEURONTIN) 100 MG capsule Take 2 capsules (200 mg total) by mouth at bedtime. 11/17/21   Lyndal Pulley, DO  HYDROcodone bit-homatropine (HYCODAN) 5-1.5 MG/5ML syrup Take 5 mLs by mouth every 8 (eight) hours as needed for cough. 12/05/21   Burchette, Alinda Sierras, MD  linaclotide Rolan Lipa) 145 MCG CAPS capsule Take 1 capsule (145 mcg total) by mouth daily before breakfast. 11/16/21   Willia Craze, NP  lisinopril (ZESTRIL) 20 MG tablet TAKE ONE TABLET BY MOUTH TWICE A DAY 07/14/21   Laurey Morale, MD  metFORMIN (GLUCOPHAGE) 500 MG tablet TAKE ONE TABLET BY MOUTH TWICE A DAY WITH MEALS 07/14/21   Laurey Morale, MD  [facility] 17 GM/SCOOP powder 1 dose in 8 ounces of water 1 to 2 times each day 11/01/21   Willia Craze, NP  Multiple Vitamins-Minerals (MULTIVITAMIN WITH MINERALS) tablet Take 1 tablet by mouth daily.    [provider]  ondansetron (ZOFRAN) 8 MG tablet TAKE ONE TABLET BY MOUTH EVERY 6 HOURS AS NEEDED FOR  NAUSEA/VOMITING 10/31/21   Laurey Morale, MD  phentermine 15 MG capsule Take 15 mg by mouth every morning.    [provider]  potassium chloride (KLOR-CON) 10 MEQ tablet TAKE TWO TABLETS BY MOUTH DAILY 07/07/21   Laurey Morale, MD  predniSONE (DELTASONE) 20 MG tablet Take 2 tablets (40 mg total) by mouth daily with breakfast. 11/17/21   Lyndal Pulley, DO  valACYclovir (VALTREX) 500 MG tablet TAKE ONE TABLET BY MOUTH TWICE A DAY AS NEEDED FOR OUTBREAKS 12/28/19   Laurey Morale, MD    Allergies    Iodinated contrast media, Iodine, Peanut-containing drug products, Hctz [hydrochlorothiazide], Shellfish-derived products, Cephalexin, and Ciprofloxacin  Review of Systems   Review of Systems  Constitutional:  Negative for appetite change and fatigue.  HENT:  Negative for congestion, ear discharge and sinus pressure.   Eyes:  Negative for discharge.  Respiratory:  Negative for cough.   Cardiovascular:  Positive for chest pain.  Gastrointestinal:  Negative for abdominal pain and diarrhea.  Genitourinary:  Negative for frequency and hematuria.  Musculoskeletal:  Negative for back pain.  Skin:  Negative for rash.  Neurological:  Negative for seizures and headaches.  Psychiatric/Behavioral:  Negative for hallucinations.    Physical Exam Updated Vital Signs BP 112/69    Pulse (!) 119    Temp 98.2 F (36.8 C) (Oral)    Resp (!) 24    Ht 5' 3"  (1.6 m)    Wt 74.8 kg    LMP 10/18/2015    SpO2 99%    BMI 29.23 kg/m   Physical Exam Vitals and nursing note reviewed.  Constitutional:      Appearance: She is well-developed.  HENT:     Head: Normocephalic.     Nose: Nose normal.  Eyes:     General: No scleral icterus.    Conjunctiva/sclera: Conjunctivae normal.  Neck:     Thyroid: No thyromegaly.  Cardiovascular:     Rate and Rhythm: Normal rate and regular rhythm.     Heart sounds: No murmur heard.   No friction rub. No gallop.  Pulmonary:     Breath sounds: No stridor. No  wheezing or rales.  Chest:     Chest wall: No tenderness.  Abdominal:     General: There is no distension.     Tenderness: There is no abdominal tenderness. There is no rebound.  Musculoskeletal:        General: Normal range of motion.     Cervical back: Neck supple.  Lymphadenopathy:     Cervical: No cervical adenopathy.  Skin:    Findings: No erythema or rash.  Neurological:     Mental Status: She is alert and oriented to person, place, and time.     Motor: No abnormal muscle tone.     Coordination: Coordination normal.  Psychiatric:        Behavior: Behavior normal.    ED Results / Procedures / Treatments   Labs (all labs ordered are listed, but only abnormal results are displayed) Labs Reviewed  BASIC METABOLIC PANEL - Abnormal; Notable for the following components:      Result Value   Glucose, Bld 124 (*)    Calcium 8.6 (*)    Anion gap 4 (*)    All other components within normal limits  HEPATIC FUNCTION PANEL - Abnormal; Notable for the following components:   Total Protein 5.5 (*)    Total Bilirubin 1.3 (*)    Indirect Bilirubin 1.1 (*)  All other components within normal limits  RESP PANEL BY RT-PCR (FLU A&B, COVID) ARPGX2  CBC  TROPONIN I (HIGH SENSITIVITY)  TROPONIN I (HIGH SENSITIVITY)    EKG EKG Interpretation  Date/Time:  Friday December 08 2021 09:45:36 EST Ventricular Rate:  69 PR Interval:  148 QRS Duration: 84 QT Interval:  396 QTC Calculation: 425 R Axis:   34 Text Interpretation: Sinus rhythm Low voltage, precordial leads Anteroseptal infarct, old Confirmed by Milton Ferguson [phone]) on 12/08/2021 10:00:58 AM  Radiology DG Chest 2 View  Result Date: 12/08/2021 CLINICAL DATA:  Chest pain EXAM: CHEST - 2 VIEW COMPARISON:  Radiograph 12/05/2021 FINDINGS: The cardiomediastinal silhouette is within normal limits. There is no focal airspace consolidation. There is no large pleural effusion. There is no visible pneumothorax. There is no acute  osseous abnormality. Thoracic spondylosis. IMPRESSION: No evidence of acute cardiopulmonary disease. Electronically Signed   By: Maurine Simmering M.D.   On: 12/08/2021 10:28    Procedures Procedures   Medications Ordered in ED Medications  sodium chloride 0.9 % bolus 1,000 mL (1,000 mLs Intravenous New Bag/Given 12/08/21 1017)    ED Course  I have reviewed the triage vital signs and the nursing notes.  Pertinent labs & imaging results that were available during my care of the patient were reviewed by me and considered in my medical decision making (see chart for details).    MDM Rules/Calculators/A&P                         Labs EKG chest x-ray unremarkable.  Patient with noncardiac chest pain.  She has not been hypotensive here but was hydrated and will follow up with her PCP    Final Clinical Impression(s) / ED Diagnoses Final diagnoses:  Dehydration    Rx / DC Orders ED Discharge Orders     None        Milton Ferguson, MD 12/08/21 1154

## 2021-12-08 NOTE — ED Triage Notes (Signed)
Patient went to her PCP today and was told to come to the ED for hypotension. Bp ranged from 81/62-92/57. Patient also c/o intermittent chest pain x 1 week. Patient denies SOB, but c/o nausea.

## 2021-12-12 ENCOUNTER — Ambulatory Visit: Payer: BC Managed Care – PPO | Admitting: Family Medicine

## 2021-12-12 ENCOUNTER — Encounter: Payer: Self-pay | Admitting: Family Medicine

## 2021-12-12 ENCOUNTER — Ambulatory Visit: Payer: Self-pay

## 2021-12-12 ENCOUNTER — Other Ambulatory Visit: Payer: Self-pay

## 2021-12-12 VITALS — BP 110/70 | HR 83 | Ht 63.0 in | Wt 171.0 lb

## 2021-12-12 DIAGNOSIS — G8929 Other chronic pain: Secondary | ICD-10-CM | POA: Diagnosis not present

## 2021-12-12 DIAGNOSIS — M216X9 Other acquired deformities of unspecified foot: Secondary | ICD-10-CM

## 2021-12-12 DIAGNOSIS — M25552 Pain in left hip: Secondary | ICD-10-CM | POA: Diagnosis not present

## 2021-12-12 DIAGNOSIS — M76892 Other specified enthesopathies of left lower limb, excluding foot: Secondary | ICD-10-CM | POA: Diagnosis not present

## 2021-12-12 DIAGNOSIS — M79671 Pain in right foot: Secondary | ICD-10-CM | POA: Diagnosis not present

## 2021-12-12 NOTE — Assessment & Plan Note (Signed)
Patient does have a pronation deformity.  Do think the patient does have some mild breakdown of the transverse arch as well as some of the plantar fasciitis.  We will start with formal physical therapy.  Patient is also having some left hip pain that I think is more compensation.  Discussed with patient about starting with physical therapy as well.  Worsening pain will consider injection of both areas.  Follow-up again in 6 to 8 weeks

## 2021-12-12 NOTE — Patient Instructions (Signed)
PT [place] and L GT pain Continue to do everything else See me in 6-8 weeks

## 2021-12-13 ENCOUNTER — Ambulatory Visit: Payer: BC Managed Care – PPO | Attending: Family Medicine

## 2021-12-13 DIAGNOSIS — R262 Difficulty in walking, not elsewhere classified: Secondary | ICD-10-CM | POA: Diagnosis present

## 2021-12-13 DIAGNOSIS — G8929 Other chronic pain: Secondary | ICD-10-CM | POA: Diagnosis present

## 2021-12-13 DIAGNOSIS — M79671 Pain in right foot: Secondary | ICD-10-CM | POA: Diagnosis present

## 2021-12-13 NOTE — Therapy (Signed)
[facility] @ [place] [place], [place], [address] Phone: [phone]   Fax:  [phone]  Physical Therapy Evaluation  Patient Details  Name: [name] MRN: [id number] Date of Birth: 11/08/1961 Referring Provider (PT): Hulan Saas, DO   Encounter Date: 12/13/2021   PT End of Session - 12/13/21 1707     Visit Number 1    Date for PT Re-Evaluation 02/07/22    Authorization Type BCBS    PT Start Time 1610    PT Stop Time 1650    PT Time Calculation (min) 40 min    Activity Tolerance Patient tolerated treatment well    Behavior During Therapy [facility] for tasks assessed/performed             Past Medical History:  Diagnosis Date   Allergy    Dr Caprice Red   Cardiac arrhythmia    Depression    Diabetes mellitus without complication [facility])    Fibroids    uterine sees Dr. Carlota Raspberry in [place]    Genital warts    Heart murmur    High cholesterol    Hypertension    Irritable bowel syndrome (IBS)    Low back pain    has a herniated lumbar disc sees Dr Marlou Sa    Past Surgical History:  Procedure Laterality Date   ABDOMINOPLASTY  04/2013   BREAST BIOPSY Right 06/2014   COLONOSCOPY  02/10/2015   per Dr. Hilarie Fredrickson, clear, repeat in 10 yrs    LUMBAR LAMINECTOMY  2010   L3-4 on 02-24-10 per Dr Louanne Skye    MYOMECTOMY     laser ablation of uterine fibroids per Dr Ouida Sills 2000   TUBAL LIGATION      There were no vitals filed for this visit.    Subjective Assessment - 12/13/21 1656     Subjective Patient reports ongoing right heel pain for approx 1 year.  She has also developed some left hip pain which she feels is due to her limping on her right foot and doing a lot of activity, lifting and cooking over the holidays.  She describes the pain as constant and specific to her medial right heel.  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  She enjoys working out at [employer] and biking.  She hopes to eliminate pain  and be able to resume prior level of function.    Limitations Standing;Walking    How long can you sit comfortably? na    How long can you stand comfortably? 30 min    How long can you walk comfortably? 30 min    Diagnostic tests US reveals mild plantar fascitis and bone spur    Patient Stated Goals To eliminate pain and avoid injections or more invasive procedures.  She would like to get back to her prior level of function.    Currently in Pain? Yes    Pain Score 2     Pain Location Heel    Pain Orientation Right;Medial    Pain Descriptors / Indicators Aching;Nagging    Pain Type Chronic pain    Pain Onset More than a month ago    Pain Frequency Constant    Aggravating Factors  walking and standing    Pain Relieving Factors rest    Effect of Pain on Daily Activities difficulty walking and standing in the classrrom and unable to do her normal workouts    Multiple Pain Sites No  [facility] @ [place], [place], [address] Phone: [phone]   Fax:  [phone]  Physical Therapy Evaluation  Patient Details  Name: [name] MRN: [id number] Date of Birth: 11/08/1961 Referring Provider (PT): Hulan Saas, DO   Encounter Date: 12/13/2021   PT End of Session - 12/13/21 1707     Visit Number 1    Date for PT Re-Evaluation 02/07/22    Authorization Type BCBS    PT Start Time 1610    PT Stop Time 1650    PT Time Calculation (min) 40 min    Activity Tolerance Patient tolerated treatment well    Behavior During Therapy [facility] for tasks assessed/performed             Past Medical History:  Diagnosis Date   Allergy    Dr Caprice Red   Cardiac arrhythmia    Depression    Diabetes mellitus without complication [facility])    Fibroids    uterine sees Dr. Carlota Raspberry in [place]    Genital warts    Heart murmur    High cholesterol    Hypertension    Irritable bowel syndrome (IBS)    Low back pain    has a herniated lumbar disc sees Dr Marlou Sa    Past Surgical History:  Procedure Laterality Date   ABDOMINOPLASTY  04/2013   BREAST BIOPSY Right 06/2014   COLONOSCOPY  02/10/2015   per Dr. Hilarie Fredrickson, clear, repeat in 10 yrs    LUMBAR LAMINECTOMY  2010   L3-4 on 02-24-10 per Dr Louanne Skye    MYOMECTOMY     laser ablation of uterine fibroids per Dr Ouida Sills 2000   TUBAL LIGATION      There were no vitals filed for this visit.    Subjective Assessment - 12/13/21 1656     Subjective Patient reports ongoing right heel pain for approx 1 year.  She has also developed some left hip pain which she feels is due to her limping on her right foot and doing a lot of activity, lifting and cooking over the holidays.  She describes the pain as constant and specific to her medial right heel.  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  She enjoys working out at [employer] and biking.  She hopes to eliminate pain  and be able to resume prior level of function.    Limitations Standing;Walking    How long can you sit comfortably? na    How long can you stand comfortably? 30 min    How long can you walk comfortably? 30 min    Diagnostic tests US reveals mild plantar fascitis and bone spur    Patient Stated Goals To eliminate pain and avoid injections or more invasive procedures.  She would like to get back to her prior level of function.    Currently in Pain? Yes    Pain Score 2     Pain Location Heel    Pain Orientation Right;Medial    Pain Descriptors / Indicators Aching;Nagging    Pain Type Chronic pain    Pain Onset More than a month ago    Pain Frequency Constant    Aggravating Factors  walking and standing    Pain Relieving Factors rest    Effect of Pain on Daily Activities difficulty walking and standing in the classrrom and unable to do her normal workouts    Multiple Pain Sites No  in overall symptoms.    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  She has been having right heel pain for as long as one year.  She presents with mild knee valgus and foot pronation bilaterally.  She has tenderness along the medial right heel and is unable to walk barefoot in her home.  She has good strength throughout the right ankle and ROM is minimally restricted.  She  would respond well to skilled PT for manual soft tissue mobilization to release any adhesions along with ROM and stretching for the gastroc soleus and plantar fascia.    Examination-Activity Limitations Stairs;Stand    Examination-Participation Restrictions Occupation;Cleaning    Stability/Clinical Decision Making Stable/Uncomplicated    Clinical Decision Making Low    Rehab Potential Good    PT Frequency 2x / week    PT Duration 8 weeks    PT Treatment/Interventions ADLs/Self Care Home Management;Aquatic Therapy;Moist Heat;Iontophoresis 4mg /ml Dexamethasone;Electrical Stimulation;Cryotherapy;Ultrasound;Gait training;Therapeutic exercise;Therapeutic activities;Functional mobility training;Stair training;Balance training;Neuromuscular re-education;Patient/family education;Manual techniques;Passive range of motion;Vasopneumatic Device;Taping;Dry needling    PT Next Visit Plan manual soft tissue work at medial right heel along with PROM, possibly ionto if MD agrees.    PT Home Exercise Plan [email or web address]    Consulted and Agree with Plan of Care Patient             Patient will benefit from skilled therapeutic intervention in order to improve the following deficits and impairments:  Abnormal gait, Difficulty walking, Pain, Impaired flexibility, Decreased strength  Visit Diagnosis: Chronic pain of right heel - Plan: PT plan of care cert/re-cert  Difficulty in walking, not elsewhere classified - Plan: PT plan of care cert/re-cert     Problem List Patient Active Problem List   Diagnosis Date Noted   Degenerative cervical disc 02/02/2021   Rotator cuff syndrome of right shoulder 12/07/2020   COVID-19 virus infection 09/20/2020   Well woman exam with routine gynecological exam 01/07/2020   Breast pain, right 01/07/2020   Depression with anxiety 10/21/2019   Hypercholesterolemia 01/19/2019   Left lumbar radiculopathy 12/25/2018   Right arm pain 11/26/2018   Low back pain 11/26/2018   Left  knee pain 10/01/2018   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   Type 2 diabetes mellitus without complication, without long-term current use of insulin ([place] ) 02/22/2017   Loss of transverse plantar arch 10/31/2016   Pronation deformity of ankle, acquired 10/31/2016   Patellofemoral pain syndrome 09/23/2015   OSA (obstructive sleep apnea) 03/31/2014   Asthma, chronic 08/14/2013   Leg edema 08/14/2013   Osteoarthritis 09/11/2010   GERD 03/17/2010   SCIATICA 08/30/2009   TROCHANTERIC BURSITIS 08/09/2009   LEG PAIN, BILATERAL 09/30/2007   Essential hypertension 09/22/2007    Anderson Malta B. Merline Perkin, PT 12/28/225:20 PM   [facility] @ [place], [place], [address] Phone: [phone]   Fax:  [phone]  Name: [name] MRN: [id number] Date of Birth: [birth year]

## 2021-12-13 NOTE — Patient Instructions (Signed)
Access Code: [email or web address] URL: [email or web address] Date: 12/13/2021 Prepared by: Candyce Churn  Exercises Gastroc Stretch on Wall - 2 x daily - 7 x weekly - 1 sets - 5 reps - 10 sec hold Soleus Stretch on Wall - 2 x daily - 7 x weekly - 1 sets - 5 reps - 10 sec hold Arch Stretch - 2 x daily - 7 x weekly - 1 sets - 5 reps - 10 sec hold Seated Plantar Fascia Mobilization with Small Ball - 2 x daily - 7 x weekly - 1 sets - 2-3 min hold Eccentric Plantar Fascia Strengthening on Step - 2 x daily - 7 x weekly - 1 sets - 10 reps - 5 sec hold

## 2021-12-25 ENCOUNTER — Ambulatory Visit: Payer: BC Managed Care – PPO | Attending: Family Medicine

## 2021-12-25 ENCOUNTER — Other Ambulatory Visit: Payer: Self-pay

## 2021-12-25 DIAGNOSIS — G8929 Other chronic pain: Secondary | ICD-10-CM | POA: Diagnosis present

## 2021-12-25 DIAGNOSIS — R262 Difficulty in walking, not elsewhere classified: Secondary | ICD-10-CM

## 2021-12-25 DIAGNOSIS — M79671 Pain in right foot: Secondary | ICD-10-CM | POA: Diagnosis not present

## 2021-12-25 NOTE — Patient Instructions (Addendum)
Having patient hold on eccentric heel raises. Access Code: [email or web address] URL: [email or web address] Date: 12/25/2021 Prepared by: Candyce Churn  Exercises Gastroc Stretch on Wall - 2 x daily - 7 x weekly - 1 sets - 5 reps - 10 sec hold Soleus Stretch on Wall - 2 x daily - 7 x weekly - 1 sets - 5 reps - 10 sec hold Arch Stretch - 2 x daily - 7 x weekly - 1 sets - 5 reps - 10 sec hold Seated Plantar Fascia Mobilization with Small Ball - 2 x daily - 7 x weekly - 1 sets - 2-3 min hold Eccentric Plantar Fascia Strengthening on Step - 2 x daily - 7 x weekly - 1 sets - 10 reps - 5 sec hold

## 2021-12-25 NOTE — Therapy (Signed)
[facility] @ [place], [place], [address] Phone: [phone]   Fax:  [phone]  Physical Therapy Treatment  Patient Details  Name: [name] MRN: [id number] Date of Birth: Mar 10, 1961 Referring Provider (PT): Hulan Saas, DO   Encounter Date: 12/25/2021   PT End of Session - 12/25/21 1530     Visit Number 2    Date for PT Re-Evaluation 02/07/22    Authorization Type BCBS    PT Start Time 1445    PT Stop Time 1529    PT Time Calculation (min) 44 min    Activity Tolerance Patient tolerated treatment well    Behavior During Therapy [facility] for tasks assessed/performed             Past Medical History:  Diagnosis Date   Allergy    Dr Caprice Red   Cardiac arrhythmia    Depression    Diabetes mellitus without complication [facility])    Fibroids    uterine sees Dr. Carlota Raspberry in [place]    Genital warts    Heart murmur    High cholesterol    Hypertension    Irritable bowel syndrome (IBS)    Low back pain    has a herniated lumbar disc sees Dr Marlou Sa    Past Surgical History:  Procedure Laterality Date   ABDOMINOPLASTY  04/2013   BREAST BIOPSY Right 06/2014   COLONOSCOPY  02/10/2015   per Dr. Hilarie Fredrickson, clear, repeat in 10 yrs    LUMBAR LAMINECTOMY  2010   L3-4 on 02-24-10 per Dr Louanne Skye    MYOMECTOMY     laser ablation of uterine fibroids per Dr Ouida Sills 2000   TUBAL LIGATION      There were no vitals filed for this visit.   Subjective Assessment - 12/25/21 1454     Subjective Patient states she has experienced more pain since last visit but that she has not done any of her exercises because she lost her papers.  She rates her pain at 5-6/10 today.    Limitations Standing;Walking    How long can you sit comfortably? na    How long can you stand comfortably? 30 min    How long can you walk comfortably? 30 min    Diagnostic tests US reveals mild plantar fascitis and bone spur    Patient Stated  Goals To eliminate pain and avoid injections or more invasive procedures.  She would like to get back to her prior level of function.    Currently in Pain? Yes    Pain Score 6     Pain Location Heel    Pain Orientation Right    Pain Descriptors / Indicators Aching    Pain Type Chronic pain    Pain Onset More than a month ago    Pain Frequency Constant                               [facility] Adult PT Treatment/Exercise - 12/25/21 0001       Exercises   Exercises Ankle      Modalities   Modalities Cryotherapy      Cryotherapy   Number Minutes Cryotherapy 8 Minutes    Cryotherapy Location Ankle    Type of Cryotherapy Ice pack      Manual Therapy   Manual Therapy Soft tissue mobilization;Passive ROM    Manual therapy comments PROM all planes right  [facility] @ [place], [place], [address] Phone: [phone]   Fax:  [phone]  Physical Therapy Treatment  Patient Details  Name: [name] MRN: [id number] Date of Birth: Mar 10, 1961 Referring Provider (PT): Hulan Saas, DO   Encounter Date: 12/25/2021   PT End of Session - 12/25/21 1530     Visit Number 2    Date for PT Re-Evaluation 02/07/22    Authorization Type BCBS    PT Start Time 1445    PT Stop Time 1529    PT Time Calculation (min) 44 min    Activity Tolerance Patient tolerated treatment well    Behavior During Therapy [facility] for tasks assessed/performed             Past Medical History:  Diagnosis Date   Allergy    Dr Caprice Red   Cardiac arrhythmia    Depression    Diabetes mellitus without complication [facility])    Fibroids    uterine sees Dr. Carlota Raspberry in [place]    Genital warts    Heart murmur    High cholesterol    Hypertension    Irritable bowel syndrome (IBS)    Low back pain    has a herniated lumbar disc sees Dr Marlou Sa    Past Surgical History:  Procedure Laterality Date   ABDOMINOPLASTY  04/2013   BREAST BIOPSY Right 06/2014   COLONOSCOPY  02/10/2015   per Dr. Hilarie Fredrickson, clear, repeat in 10 yrs    LUMBAR LAMINECTOMY  2010   L3-4 on 02-24-10 per Dr Louanne Skye    MYOMECTOMY     laser ablation of uterine fibroids per Dr Ouida Sills 2000   TUBAL LIGATION      There were no vitals filed for this visit.   Subjective Assessment - 12/25/21 1454     Subjective Patient states she has experienced more pain since last visit but that she has not done any of her exercises because she lost her papers.  She rates her pain at 5-6/10 today.    Limitations Standing;Walking    How long can you sit comfortably? na    How long can you stand comfortably? 30 min    How long can you walk comfortably? 30 min    Diagnostic tests US reveals mild plantar fascitis and bone spur    Patient Stated  Goals To eliminate pain and avoid injections or more invasive procedures.  She would like to get back to her prior level of function.    Currently in Pain? Yes    Pain Score 6     Pain Location Heel    Pain Orientation Right    Pain Descriptors / Indicators Aching    Pain Type Chronic pain    Pain Onset More than a month ago    Pain Frequency Constant                               [facility] Adult PT Treatment/Exercise - 12/25/21 0001       Exercises   Exercises Ankle      Modalities   Modalities Cryotherapy      Cryotherapy   Number Minutes Cryotherapy 8 Minutes    Cryotherapy Location Ankle    Type of Cryotherapy Ice pack      Manual Therapy   Manual Therapy Soft tissue mobilization;Passive ROM    Manual therapy comments PROM all planes right  Decreased strength  Visit Diagnosis: Chronic pain of right heel  Difficulty in walking, not elsewhere classified     Problem List Patient Active Problem List   Diagnosis Date Noted   Degenerative cervical disc 02/02/2021   Rotator cuff  syndrome of right shoulder 12/07/2020   COVID-19 virus infection 09/20/2020   Well woman exam with routine gynecological exam 01/07/2020   Breast pain, right 01/07/2020   Depression with anxiety 10/21/2019   Hypercholesterolemia 01/19/2019   Left lumbar radiculopathy 12/25/2018   Right arm pain 11/26/2018   Low back pain 11/26/2018   Left knee pain 10/01/2018   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   Type 2 diabetes mellitus without complication, without long-term current use of insulin ([place]) 02/22/2017   Loss of transverse plantar arch 10/31/2016   Pronation deformity of ankle, acquired 10/31/2016   Patellofemoral pain syndrome 09/23/2015   OSA (obstructive sleep apnea) 03/31/2014   Asthma, chronic 08/14/2013   Leg edema 08/14/2013   Osteoarthritis 09/11/2010   GERD 03/17/2010   SCIATICA 08/30/2009   TROCHANTERIC BURSITIS 08/09/2009   LEG PAIN, BILATERAL 09/30/2007   Essential hypertension 09/22/2007    Anderson Malta B. Astin Rape, PT 01/09/233:34 PM   [facility] @ [place] [address] [place], [place], [address] Phone: [phone]   Fax:  [phone]  Name: [name] MRN: [id number] Date of Birth: 11-12-1961

## 2021-12-28 ENCOUNTER — Ambulatory Visit: Payer: BC Managed Care – PPO

## 2021-12-28 ENCOUNTER — Other Ambulatory Visit: Payer: Self-pay

## 2021-12-28 DIAGNOSIS — G8929 Other chronic pain: Secondary | ICD-10-CM

## 2021-12-28 DIAGNOSIS — R262 Difficulty in walking, not elsewhere classified: Secondary | ICD-10-CM

## 2021-12-28 DIAGNOSIS — M79671 Pain in right foot: Secondary | ICD-10-CM | POA: Diagnosis not present

## 2021-12-28 NOTE — Therapy (Signed)
[facility] @ [address] [place], [place], [address] Phone: [phone]   Fax:  [phone]  Physical Therapy Treatment  Patient Details  Name: [name] MRN: [id number] Date of Birth: [birth year] Referring Provider (PT): Hulan Saas, DO   Encounter Date: 12/28/2021   PT End of Session - 12/28/21 1535     Visit Number 3    Date for PT Re-Evaluation 02/07/22    Authorization Type BCBS    PT Start Time 1445    PT Stop Time 1532    PT Time Calculation (min) 47 min    Activity Tolerance Patient tolerated treatment well    Behavior During Therapy [facility] for tasks assessed/performed             Past Medical History:  Diagnosis Date   Allergy    Dr Caprice Red   Cardiac arrhythmia    Depression    Diabetes mellitus without complication [facility])    Fibroids    uterine sees Dr. Carlota Raspberry in [place]    Genital warts    Heart murmur    High cholesterol    Hypertension    Irritable bowel syndrome (IBS)    Low back pain    has a herniated lumbar disc sees Dr Marlou Sa    Past Surgical History:  Procedure Laterality Date   ABDOMINOPLASTY  04/2013   BREAST BIOPSY Right 06/2014   COLONOSCOPY  02/10/2015   per Dr. Hilarie Fredrickson, clear, repeat in 10 yrs    LUMBAR LAMINECTOMY  2010   L3-4 on 02-24-10 per Dr Louanne Skye    MYOMECTOMY     laser ablation of uterine fibroids per Dr Ouida Sills 2000   TUBAL LIGATION      There were no vitals filed for this visit.   Subjective Assessment - 12/28/21 1451     Subjective Patient states that after last visit, she did not have pain as consistently.  She rates her pain at 4-5/10 today.    Limitations Standing;Walking    How long can you sit comfortably? na    How long can you stand comfortably? 30 min    How long can you walk comfortably? 30 min    Diagnostic tests US reveals mild plantar fascitis and bone spur    Patient Stated Goals To eliminate pain and avoid injections or more invasive  procedures.  She would like to get back to her prior level of function.    Currently in Pain? Yes    Pain Score 5     Pain Location Heel    Pain Orientation Right    Pain Descriptors / Indicators Aching    Pain Type Chronic pain    Pain Onset More than a month ago                               [facility] Adult PT Treatment/Exercise - 12/28/21 0001       Exercises   Exercises Ankle      Modalities   Modalities Cryotherapy      Cryotherapy   Number Minutes Cryotherapy 10 Minutes    Cryotherapy Location Ankle    Type of Cryotherapy Ice pack      Manual Therapy   Manual Therapy Soft tissue mobilization;Passive ROM;Myofascial release   used hawks grip to scrape painful area   Manual therapy comments PROM all planes right ankle, cross friction massage to medial plantar fascia right  strength  Visit Diagnosis: Chronic pain of right heel  Difficulty in walking, not elsewhere classified     Problem List Patient Active Problem List   Diagnosis Date Noted   Degenerative cervical  disc 02/02/2021   Rotator cuff syndrome of right shoulder 12/07/2020   COVID-19 virus infection 09/20/2020   Well woman exam with routine gynecological exam 01/07/2020   Breast pain, right 01/07/2020   Depression with anxiety 10/21/2019   Hypercholesterolemia 01/19/2019   Left lumbar radiculopathy 12/25/2018   Right arm pain 11/26/2018   Low back pain 11/26/2018   Left knee pain 10/01/2018   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   Type 2 diabetes mellitus without complication, without long-term current use of insulin ([place]) 02/22/2017   Loss of transverse plantar arch 10/31/2016   Pronation deformity of ankle, acquired 10/31/2016   Patellofemoral pain syndrome 09/23/2015   OSA (obstructive sleep apnea) 03/31/2014   Asthma, chronic 08/14/2013   Leg edema 08/14/2013   Osteoarthritis 09/11/2010   GERD 03/17/2010   SCIATICA 08/30/2009   TROCHANTERIC BURSITIS 08/09/2009   LEG PAIN, BILATERAL 09/30/2007   Essential hypertension 09/22/2007    Anderson Malta B. Omer Puccinelli, PT 01/12/233:41 PM   [facility] @ [place] [place], [place], [address] Phone: [phone]   Fax:  [phone]  Name: WILMOTH RASNIC MRN: [id number] Date of Birth: 1961/10/13  [facility] [facility] @ [place] [place], [place], [address] Phone: [phone]   Fax:  [phone]  Physical Therapy Treatment  Patient Details  Name: [name] MRN: [id number] Date of Birth: [birth year] Referring Provider (PT): Hulan Saas, DO   Encounter Date: 12/28/2021   PT End of Session - 12/28/21 1535     Visit Number 3    Date for PT Re-Evaluation 02/07/22    Authorization Type BCBS    PT Start Time 1445    PT Stop Time 1532    PT Time Calculation (min) 47 min    Activity Tolerance Patient tolerated treatment well    Behavior During Therapy [facility] for tasks assessed/performed             Past Medical History:  Diagnosis Date   Allergy    Dr Caprice Red   Cardiac arrhythmia    Depression    Diabetes mellitus without complication [facility])    Fibroids    uterine sees Dr. Carlota Raspberry in [place]    Genital warts    Heart murmur    High cholesterol    Hypertension    Irritable bowel syndrome (IBS)    Low back pain    has a herniated lumbar disc sees Dr Marlou Sa    Past Surgical History:  Procedure Laterality Date   ABDOMINOPLASTY  04/2013   BREAST BIOPSY Right 06/2014   COLONOSCOPY  02/10/2015   per Dr. Hilarie Fredrickson, clear, repeat in 10 yrs    LUMBAR LAMINECTOMY  2010   L3-4 on 02-24-10 per Dr Louanne Skye    MYOMECTOMY     laser ablation of uterine fibroids per Dr Ouida Sills 2000   TUBAL LIGATION      There were no vitals filed for this visit.   Subjective Assessment - 12/28/21 1451     Subjective Patient states that after last visit, she did not have pain as consistently.  She rates her pain at 4-5/10 today.    Limitations Standing;Walking    How long can you sit comfortably? na    How long can you stand comfortably? 30 min    How long can you walk comfortably? 30 min    Diagnostic tests US reveals mild plantar fascitis and bone spur    Patient Stated Goals To eliminate pain and avoid injections or more invasive  procedures.  She would like to get back to her prior level of function.    Currently in Pain? Yes    Pain Score 5     Pain Location Heel    Pain Orientation Right    Pain Descriptors / Indicators Aching    Pain Type Chronic pain    Pain Onset More than a month ago                               [facility] Adult PT Treatment/Exercise - 12/28/21 0001       Exercises   Exercises Ankle      Modalities   Modalities Cryotherapy      Cryotherapy   Number Minutes Cryotherapy 10 Minutes    Cryotherapy Location Ankle    Type of Cryotherapy Ice pack      Manual Therapy   Manual Therapy Soft tissue mobilization;Passive ROM;Myofascial release   used hawks grip to scrape painful area   Manual therapy comments PROM all planes right ankle, cross friction massage to medial plantar fascia right

## 2022-01-05 ENCOUNTER — Emergency Department (HOSPITAL_COMMUNITY)
Admission: EM | Admit: 2022-01-05 | Discharge: 2022-01-05 | Disposition: A | Discharge status: Home / Self Care | Payer: BC Managed Care – PPO | Attending: Emergency Medicine | Admitting: Emergency Medicine

## 2022-01-05 ENCOUNTER — Emergency Department (HOSPITAL_COMMUNITY): Payer: BC Managed Care – PPO

## 2022-01-05 ENCOUNTER — Encounter (HOSPITAL_COMMUNITY): Payer: Self-pay | Admitting: Emergency Medicine

## 2022-01-05 ENCOUNTER — Ambulatory Visit
Admission: EM | Admit: 2022-01-05 | Discharge: 2022-01-05 | Disposition: A | Discharge status: Home / Self Care | Payer: BC Managed Care – PPO | Attending: Physician Assistant | Admitting: Physician Assistant

## 2022-01-05 ENCOUNTER — Other Ambulatory Visit: Payer: Self-pay

## 2022-01-05 DIAGNOSIS — Z79899 Other long term (current) drug therapy: Secondary | ICD-10-CM | POA: Insufficient documentation

## 2022-01-05 DIAGNOSIS — I10 Essential (primary) hypertension: Secondary | ICD-10-CM | POA: Diagnosis not present

## 2022-01-05 DIAGNOSIS — J188 Other pneumonia, unspecified organism: Secondary | ICD-10-CM | POA: Diagnosis not present

## 2022-01-05 DIAGNOSIS — Z20822 Contact with and (suspected) exposure to covid-19: Secondary | ICD-10-CM | POA: Diagnosis not present

## 2022-01-05 DIAGNOSIS — R059 Cough, unspecified: Secondary | ICD-10-CM

## 2022-01-05 DIAGNOSIS — R0789 Other chest pain: Secondary | ICD-10-CM | POA: Diagnosis not present

## 2022-01-05 DIAGNOSIS — J069 Acute upper respiratory infection, unspecified: Secondary | ICD-10-CM

## 2022-01-05 DIAGNOSIS — E119 Type 2 diabetes mellitus without complications: Secondary | ICD-10-CM | POA: Diagnosis not present

## 2022-01-05 DIAGNOSIS — Z7984 Long term (current) use of oral hypoglycemic drugs: Secondary | ICD-10-CM | POA: Diagnosis not present

## 2022-01-05 DIAGNOSIS — J189 Pneumonia, unspecified organism: Secondary | ICD-10-CM

## 2022-01-05 LAB — BASIC METABOLIC PANEL
Anion gap: 6 (ref 5–15)
BUN: 10 mg/dL (ref 6–20)
CO2: 24 mmol/L (ref 22–32)
Calcium: 9.1 mg/dL (ref 8.9–10.3)
Chloride: 110 mmol/L (ref 98–111)
Creatinine, Ser: 0.74 mg/dL (ref 0.44–1.00)
GFR, Estimated: >60 mL/min (ref >60)
Glucose, Bld: 82 mg/dL (ref 70–99)
Potassium: 3.9 mmol/L (ref 3.5–5.1)
Sodium: 140 mmol/L (ref 135–145)

## 2022-01-05 LAB — TROPONIN I (HIGH SENSITIVITY)
Troponin I (High Sensitivity): <2 ng/L (ref <18)
Troponin I (High Sensitivity): <2 ng/L (ref <18)

## 2022-01-05 LAB — CBC
HCT: 45.5 % (ref 36.0–46.0)
Hemoglobin: 15.3 g/dL — ABNORMAL HIGH (ref 12.0–15.0)
MCH: 30 pg (ref 26.0–34.0)
MCHC: 33.6 g/dL (ref 30.0–36.0)
MCV: 89.2 fL (ref 80.0–100.0)
Platelets: 349 10*3/uL (ref 150–400)
RBC: 5.1 MIL/uL (ref 3.87–5.11)
RDW: 14.4 % (ref 11.5–15.5)
WBC: 6.9 10*3/uL (ref 4.0–10.5)
nRBC: 0 % (ref 0.0–0.2)

## 2022-01-05 LAB — RESP PANEL BY RT-PCR (FLU A&B, COVID) ARPGX2
Influenza A by PCR: NEGATIVE
Influenza B by PCR: NEGATIVE
SARS Coronavirus 2 by RT PCR: NEGATIVE

## 2022-01-05 LAB — POCT INFLUENZA A/B
Influenza A, POC: NEGATIVE
Influenza B, POC: NEGATIVE

## 2022-01-05 MED ORDER — DOXYCYCLINE HYCLATE 100 MG PO CAPS: 100.0000 mg | ORAL_CAPSULE | Freq: Two times a day (BID) | ORAL | 0 refills | Status: DC

## 2022-01-05 NOTE — ED Provider Notes (Signed)
[facility] DEPT Provider Note   CSN: [id number] Arrival date & time: 01/05/22  1155     History  Chief Complaint  Patient presents with   Chest Pain   Cough   Nausea   Generalized Body Aches    [name] is a 62 y.o. female with a past medical history significant for diabetes, hypertension, hyperlipidemia who presents with 4 days of fever, body aches, nausea, diarrhea, as well as chest pain that began last night.  Patient reports that the chest pain started last night around dinnertime, was left-sided, felt like a pressure, was possibly associated with movement versus exertion versus coughing.  Chest pain resolved at this time, but was still present earlier this morning.  No significant shortness of breath.  Patient has an ongoing wet cough.  Patient had positive diagnosis for COVID in December that she was unaware of.  Patient was given doxycycline at that time secondary to coverage for possible pneumonia.  She does endorse first-degree family history of ACS.   Chest Pain Associated symptoms: cough   Cough Associated symptoms: chest pain       Home Medications Prior to Admission medications   Medication Sig Start Date End Date Taking? Authorizing Provider  doxycycline (VIBRAMYCIN) 100 MG capsule Take 1 capsule (100 mg total) by mouth 2 (two) times daily. 01/05/22  Yes Kenyata Guess H, PA-C  albuterol (VENTOLIN HFA) 108 (90 Base) MCG/ACT inhaler Inhale 2 puffs into the lungs every 4 (four) hours as needed for wheezing or shortness of breath. 09/20/20   Laurey Morale, MD  amLODipine (NORVASC) 10 MG tablet Take 1 tablet (10 mg total) by mouth daily. 05/02/21   Laurey Morale, MD  atorvastatin (LIPITOR) 20 MG tablet Take 1 tablet (20 mg total) by mouth daily. 05/02/21   Laurey Morale, MD  blood glucose meter kit and supplies Dispense based on patient and insurance preference. Test once a day. E11.9 05/04/20   Laurey Morale, MD  cholecalciferol  (VITAMIN D) 1000 UNITS tablet Take 1,000 Units by mouth daily.     [provider]  gabapentin (NEURONTIN) 100 MG capsule Take 2 capsules (200 mg total) by mouth at bedtime. 11/17/21   Lyndal Pulley, DO  HYDROcodone bit-homatropine (HYCODAN) 5-1.5 MG/5ML syrup Take 5 mLs by mouth every 8 (eight) hours as needed for cough. 12/05/21   Burchette, Alinda Sierras, MD  linaclotide Rolan Lipa) 145 MCG CAPS capsule Take 1 capsule (145 mcg total) by mouth daily before breakfast. 11/16/21   Willia Craze, NP  lisinopril (ZESTRIL) 20 MG tablet TAKE ONE TABLET BY MOUTH TWICE A DAY 07/14/21   Laurey Morale, MD  metFORMIN (GLUCOPHAGE) 500 MG tablet TAKE ONE TABLET BY MOUTH TWICE A DAY WITH MEALS 07/14/21   Laurey Morale, MD  [facility] 17 GM/SCOOP powder 1 dose in 8 ounces of water 1 to 2 times each day 11/01/21   Willia Craze, NP  Multiple Vitamins-Minerals (MULTIVITAMIN WITH MINERALS) tablet Take 1 tablet by mouth daily.    [provider]  ondansetron (ZOFRAN) 8 MG tablet TAKE ONE TABLET BY MOUTH EVERY 6 HOURS AS NEEDED FOR NAUSEA/VOMITING 10/31/21   Laurey Morale, MD  phentermine 15 MG capsule Take 15 mg by mouth every morning.    [provider]  potassium chloride (KLOR-CON) 10 MEQ tablet TAKE TWO TABLETS BY MOUTH DAILY 07/07/21   Laurey Morale, MD  predniSONE (DELTASONE) 20 MG tablet Take 2 tablets (40  mg total) by mouth daily with breakfast. 11/17/21   Lyndal Pulley, DO  valACYclovir (VALTREX) 500 MG tablet TAKE ONE TABLET BY MOUTH TWICE A DAY AS NEEDED FOR OUTBREAKS 12/28/19   Laurey Morale, MD      Allergies    Iodinated contrast media, Iodine, Peanut-containing drug products, Hctz [hydrochlorothiazide], Shellfish-derived products, Cephalexin, and Ciprofloxacin    Review of Systems   Review of Systems  Respiratory:  Positive for cough.   Cardiovascular:  Positive for chest pain.  All other systems reviewed and are negative.  Physical Exam Updated Vital Signs BP  115/82    Pulse 67    Temp 97.9 F (36.6 C) (Oral)    Resp 16    Ht $R'5\' 3"'Fa$  (1.6 m)    Wt 76.7 kg    LMP 10/18/2015    SpO2 99%    BMI 29.94 kg/m  Physical Exam Vitals and nursing note reviewed.  Constitutional:      General: She is not in acute distress.    Appearance: Normal appearance.  HENT:     Head: Normocephalic and atraumatic.  Eyes:     General:        Right eye: No discharge.        Left eye: No discharge.  Cardiovascular:     Rate and Rhythm: Normal rate and regular rhythm.     Heart sounds: No murmur heard.   No friction rub. No gallop.  Pulmonary:     Effort: Pulmonary effort is normal.     Breath sounds: Normal breath sounds.     Comments: Some scattered rhonchi that clear with cough.  No focal consolidation noted.  No respiratory distress.  Wet cough throughout exam. Abdominal:     General: Bowel sounds are normal.     Palpations: Abdomen is soft.  Skin:    General: Skin is warm and dry.     Capillary Refill: Capillary refill takes less than 2 seconds.  Neurological:     Mental Status: She is alert and oriented to person, place, and time.  Psychiatric:        Mood and Affect: Mood normal.        Behavior: Behavior normal.    ED Results / Procedures / Treatments   Labs (all labs ordered are listed, but only abnormal results are displayed) Labs Reviewed  CBC - Abnormal; Notable for the following components:      Result Value   Hemoglobin 15.3 (*)    All other components within normal limits  RESP PANEL BY RT-PCR (FLU A&B, COVID) [id number]  BASIC METABOLIC PANEL  TROPONIN I (HIGH SENSITIVITY)  TROPONIN I (HIGH SENSITIVITY)    EKG None  Radiology DG Chest 2 View  Result Date: 01/05/2022 CLINICAL DATA:  Cough, headache, dizziness. EXAM: CHEST - 2 VIEW COMPARISON:  Chest radiograph 12/08/2021. FINDINGS: Stable cardiac and mediastinal contours. Patchy airspace opacities right lower lung. No pleural effusion or pneumothorax. Osseous structures unremarkable.  IMPRESSION: Patchy airspace opacities right lower lobe may represent pneumonia in the appropriate clinical setting. Followup PA and lateral chest X-ray is recommended in 3-4 weeks following trial of antibiotic therapy to ensure resolution and exclude underlying malignancy. Electronically Signed   By: Lovey Newcomer M.D.   On: 01/05/2022 13:36    Procedures Procedures    Medications Ordered in ED Medications - No data to display  ED Course/ Medical Decision Making/ A&P Clinical Course as of 01/05/22 1551  Fri Jan 05, 2022  1443  Patient with past medical history significant for diabetes, hypertension, hypercholesterolemia, first-degree family history of ACS, with previous negative stress test, who has followed with cardiology in the past who presents with chest pain, cough, chills, nausea, vomiting, diarrhea for the last week.  Patient reports that chest pain just began last night, feels like a pressure on the left side, not associated with diaphoresis, does not seem to be tied to nausea, does seem slightly worse with movement or exertion.  Worse with cough.  Chest pain is resolved at time of my interview.  No radiation to arms, neck.  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  Patient also endorses history of chronic GI problems with IBS, takes Linzess and other medications.  Ongoing wet cough. [CP]    Clinical Course User Index [CP] Anselmo Pickler, PA-C                           Medical Decision Making Amount and/or Complexity of Data Reviewed Labs: ordered. Radiology: ordered.  Risk Prescription drug management.  I discussed this case with my attending physician who cosigned this note including patient's presenting symptoms, physical exam, and planned diagnostics and interventions. Attending physician stated agreement with plan or made changes to plan which were implemented.   This is an overall well-appearing patient with significant comorbidities of  diabetes, hypertension, hypercholesterolemia who presents with fever, body aches, nausea, diarrhea since Wednesday.  Also endorses chest pain.  Differential diagnosis includes upper respiratory infection, acute bronchitis, pneumonia, ACS, less clinical concern for Boerhaave's, aortic aneurysm, aortic dissection, acid reflux.  To have some suspicion that there may be some component of patient's ongoing IBS involved.  My physical exam was remarkable for rhonchi on pulmonary exam, no focal consolidation noted on auscultation, as well as wet cough.  No tenderness palpation chest wall.  Stable vital signs throughout entirety of stay.  I personally ordered and reviewed lab work which is significant for normal CBC, normal BMP, RVP negative, troponins negative x2. EKG with non-specific t-wave abnormalities. No signs of ischemia. My attending independently reviewed these findings and agrees with this interpretation. Chest x-ray shows possible right lower lobe infiltrate.  I agree with the radiologist interpretation, however it is somewhat equivocal.  Patient is not overall ill-appearing, is afebrile, but does have some scattered rhonchi, we will treat as presumed pneumonia.  Patient with heart score of 5, negative troponin x2, and previous stress test and cardiac work-up which were without abnormality.  In context of alternative diagnosis of new pneumonia, and resolution of chest pain do not feel strongly that patient needs continued evaluation for this isolated episode of chest pain.  But I do recommend that she follow-up with her primary care, as well as her cardiologist.  Discharged in stable condition with new rx for doxycycline and plan for close follow up. Final Clinical Impression(s) / ED Diagnoses Final diagnoses:  Community acquired pneumonia of right lower lobe of lung    Rx / DC Orders ED Discharge Orders          Ordered    doxycycline (VIBRAMYCIN) 100 MG capsule  2 times daily        01/05/22  1529              Camrin Gearheart, [place], PA-C 01/05/22 1551    Lajean Saver, MD 01/05/22 1556

## 2022-01-05 NOTE — ED Triage Notes (Signed)
Pt c/o cough, headache, dizziness, chest pain, nausea, diarrhea, stomach pain, earache, nasal congestion, body aches and chills, fatigue  Denies sore throat,   Onset ~ monday

## 2022-01-05 NOTE — Discharge Instructions (Addendum)
As we discussed your chest x-ray today showed a possible early pneumonia.  Your work-up for your chest pain was unremarkable, does not seem to be related to your heart, your EKG was reassuring, and the enzyme that we check for heart damage was negative x2.  Your other labs were unremarkable.  Please take the entire course of antibiotics that I am prescribing, I recommend taking them on a full stomach.  Please follow-up with your primary care doctor in the next week or 2 to ensure that you are improving.

## 2022-01-05 NOTE — ED Provider Notes (Signed)
[facility]    CSN: [id number] Arrival date & time: 01/05/22  1029      History   Chief Complaint Chief Complaint  Patient presents with   Cough    HPI [name] is a 61 y.o. female.   Patient here today for evaluation of cough, congestion, dizziness, nausea, diarrhea, body aches and chills and fatigue that started 4 days ago.  She reports she had chest pain that started last night.  She has not had sore throat.  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  The history is provided by the patient.  Cough Associated symptoms: chest pain, chills and ear pain   Associated symptoms: no eye discharge, no fever and no sore throat    Past Medical History:  Diagnosis Date   Allergy    Dr Caprice Red   Cardiac arrhythmia    Depression    Diabetes mellitus without complication [facility])    Fibroids    uterine sees Dr. Carlota Raspberry in [place]    Genital warts    Heart murmur    High cholesterol    Hypertension    Irritable bowel syndrome (IBS)    Low back pain    has a herniated lumbar disc sees Dr Marlou Sa    Patient Active Problem List   Diagnosis Date Noted   Degenerative cervical disc 02/02/2021   Rotator cuff syndrome of right shoulder 12/07/2020   COVID-19 virus infection 09/20/2020   Well woman exam with routine gynecological exam 01/07/2020   Breast pain, right 01/07/2020   Depression with anxiety 10/21/2019   Hypercholesterolemia 01/19/2019   Left lumbar radiculopathy 12/25/2018   Right arm pain 11/26/2018   Low back pain 11/26/2018   Left knee pain 10/01/2018   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   Type 2 diabetes mellitus without complication, without long-term current use of insulin ([place]) 02/22/2017   Loss of transverse plantar arch 10/31/2016   Pronation deformity of ankle, acquired 10/31/2016   Patellofemoral pain syndrome 09/23/2015   OSA (obstructive sleep apnea) 03/31/2014   Asthma, chronic  08/14/2013   Leg edema 08/14/2013   Osteoarthritis 09/11/2010   GERD 03/17/2010   SCIATICA 08/30/2009   TROCHANTERIC BURSITIS 08/09/2009   LEG PAIN, BILATERAL 09/30/2007   Essential hypertension 09/22/2007    Past Surgical History:  Procedure Laterality Date   ABDOMINOPLASTY  04/2013   BREAST BIOPSY Right 06/2014   COLONOSCOPY  02/10/2015   per Dr. Hilarie Fredrickson, clear, repeat in 10 yrs    LUMBAR LAMINECTOMY  2010   L3-4 on 02-24-10 per Dr Louanne Skye    MYOMECTOMY     laser ablation of uterine fibroids per Dr Ouida Sills 2000   TUBAL LIGATION      OB History     Gravida  6   Para  4   Term  4   Preterm      AB  2   Living  4      SAB      IAB      Ectopic      Multiple      Live Births               Home Medications    Prior to Admission medications   Medication Sig Start Date End Date Taking? Authorizing Provider  albuterol (VENTOLIN HFA) 108 (90 Base) MCG/ACT inhaler Inhale 2 puffs into the lungs every 4 (four) hours as needed for wheezing or shortness of breath.  09/20/20   Laurey Morale, MD  amLODipine (NORVASC) 10 MG tablet Take 1 tablet (10 mg total) by mouth daily. 05/02/21   Laurey Morale, MD  atorvastatin (LIPITOR) 20 MG tablet Take 1 tablet (20 mg total) by mouth daily. 05/02/21   Laurey Morale, MD  blood glucose meter kit and supplies Dispense based on patient and insurance preference. Test once a day. E11.9 05/04/20   Laurey Morale, MD  cholecalciferol (VITAMIN D) 1000 UNITS tablet Take 1,000 Units by mouth daily.     [provider]  doxycycline (VIBRAMYCIN) 100 MG capsule Take 1 capsule (100 mg total) by mouth 2 (two) times daily. 12/05/21   Burchette, Alinda Sierras, MD  gabapentin (NEURONTIN) 100 MG capsule Take 2 capsules (200 mg total) by mouth at bedtime. 11/17/21   Lyndal Pulley, DO  HYDROcodone bit-homatropine (HYCODAN) 5-1.5 MG/5ML syrup Take 5 mLs by mouth every 8 (eight) hours as needed for cough. 12/05/21   Burchette, Alinda Sierras, MD   linaclotide Rolan Lipa) 145 MCG CAPS capsule Take 1 capsule (145 mcg total) by mouth daily before breakfast. 11/16/21   Willia Craze, NP  lisinopril (ZESTRIL) 20 MG tablet TAKE ONE TABLET BY MOUTH TWICE A DAY 07/14/21   Laurey Morale, MD  metFORMIN (GLUCOPHAGE) 500 MG tablet TAKE ONE TABLET BY MOUTH TWICE A DAY WITH MEALS 07/14/21   Laurey Morale, MD  [facility] 17 GM/SCOOP powder 1 dose in 8 ounces of water 1 to 2 times each day 11/01/21   Willia Craze, NP  Multiple Vitamins-Minerals (MULTIVITAMIN WITH MINERALS) tablet Take 1 tablet by mouth daily.    [provider]  ondansetron (ZOFRAN) 8 MG tablet TAKE ONE TABLET BY MOUTH EVERY 6 HOURS AS NEEDED FOR NAUSEA/VOMITING 10/31/21   Laurey Morale, MD  phentermine 15 MG capsule Take 15 mg by mouth every morning.    [provider]  potassium chloride (KLOR-CON) 10 MEQ tablet TAKE TWO TABLETS BY MOUTH DAILY 07/07/21   Laurey Morale, MD  predniSONE (DELTASONE) 20 MG tablet Take 2 tablets (40 mg total) by mouth daily with breakfast. 11/17/21   Lyndal Pulley, DO  valACYclovir (VALTREX) 500 MG tablet TAKE ONE TABLET BY MOUTH TWICE A DAY AS NEEDED FOR OUTBREAKS 12/28/19   Laurey Morale, MD    Family History Family History  Problem Relation Age of Onset   Diabetes Mother    Hypertension Mother    Diabetes Father    Hypertension Father    Heart disease Father    Breast cancer Sister    Diabetes Maternal Grandmother    Hypertension Maternal Grandmother    Diabetes Maternal Grandfather    Hypertension Maternal Grandfather    Diabetes Paternal Grandmother    Hypertension Paternal Grandmother    Diabetes Paternal Grandfather    Hypertension Paternal Grandfather    Arthritis Other        Family Hx   Diabetes Other        Family Hx 1st degree relative   Hyperlipidemia Other        Family Hx   Hypertension Other        Family Hx   Prostate cancer Other        Family Hx 1st Degree relative <50   Stroke Other         First Degree Female <50   Colon cancer Neg Hx    Esophageal cancer Neg Hx    Pancreatic cancer Neg Hx  Stomach cancer Neg Hx    Liver disease Neg Hx     Social History Social History   Tobacco Use   Smoking status: Former    Packs/day: 0.50    Years: 1.00    Pack years: 0.50    Types: Cigarettes    Quit date: 12/17/1996    Years since quitting: 25.0   Smokeless tobacco: Never  Vaping Use   Vaping Use: Never used  Substance Use Topics   Alcohol use: Yes    Alcohol/week: 0.0 standard drinks    Comment: occ   Drug use: No     Allergies   Iodinated contrast media, Iodine, Peanut-containing drug products, Hctz [hydrochlorothiazide], Shellfish-derived products, Cephalexin, and Ciprofloxacin   Review of Systems Review of Systems  Constitutional:  Positive for chills and fatigue. Negative for fever.  HENT:  Positive for congestion and ear pain. Negative for sore throat.   Eyes:  Negative for discharge and redness.  Respiratory:  Positive for cough.   Cardiovascular:  Positive for chest pain.  Gastrointestinal:  Positive for abdominal pain, diarrhea and nausea. Negative for vomiting.    Physical Exam Triage Vital Signs ED Triage Vitals [01/05/22 1044]  Enc Vitals Group     BP 131/77     Pulse Rate 97     Resp 18     Temp 98.2 F (36.8 C)     Temp Source Oral     SpO2 95 %     Weight      Height      Head Circumference      Peak Flow      Pain Score 0     Pain Loc      Pain Edu?      Excl. in [place]?    No data found.  Updated Vital Signs BP 131/77 (BP Location: Left Arm)    Pulse 97    Temp 98.2 F (36.8 C) (Oral)    Resp 18    LMP 10/18/2015    SpO2 95%      Physical Exam Vitals and nursing note reviewed.  Constitutional:      General: She is not in acute distress.    Appearance: Normal appearance. She is not ill-appearing.  HENT:     Head: Normocephalic and atraumatic.     Nose: Congestion present.  Eyes:     Conjunctiva/sclera: Conjunctivae normal.   Cardiovascular:     Rate and Rhythm: Normal rate and regular rhythm.     Heart sounds: Normal heart sounds. No murmur heard. Pulmonary:     Effort: Pulmonary effort is normal. No respiratory distress.     Breath sounds: Normal breath sounds. No wheezing, rhonchi or rales.     Comments: Deep breathing produces cough Skin:    General: Skin is warm and dry.  Neurological:     Mental Status: She is alert.  Psychiatric:        Mood and Affect: Mood normal.        Thought Content: Thought content normal.     UC Treatments / Results  Labs (all labs ordered are listed, but only abnormal results are displayed) Labs Reviewed  POCT INFLUENZA A/B    EKG   Radiology No results found.  Procedures Procedures (including critical care time)  Medications Ordered in UC Medications - No data to display  Initial Impression / Assessment and Plan / UC Course  I have reviewed the triage vital signs and the nursing notes.  Pertinent  labs & imaging results that were available during my care of the patient were reviewed by me and considered in my medical decision making (see chart for details).    EKG appears similar to prior without any notable changes. Discussed that EKG did not rule out cardiac etiology however and did recommend follow up in the ED for further evaluation, labs, etc. Flu test was negative in office. High suspicion for covid vs other viral URI. Patient is agreeable to report to ED after leaving our office.   Final Clinical Impressions(s) / UC Diagnoses   Final diagnoses:  Chest discomfort  Acute upper respiratory infection   Discharge Instructions   None    ED Prescriptions   None    PDMP not reviewed this encounter.   Francene Finders, PA-C 01/05/22 1136

## 2022-01-05 NOTE — ED Triage Notes (Signed)
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 Was sent by UC at [facility].

## 2022-01-05 NOTE — ED Provider Triage Note (Signed)
Emergency Medicine Provider Triage Evaluation Note  [name] , a 61 y.o. female  was evaluated in triage.  Pt complains of chest pain, cough, and nausea and diarrhea. States that same has been ongoing since Monday. States that she then developed chest pain that is substernal and constantly present regardless of if she is coughing or not. It does not radiate and is not exertional. No cardiac history. Was swabbed for flu at [facility] which was negative but sent here for further chest pain work-up.  Review of Systems  Positive:  Negative: See above  Physical Exam  BP (!) 125/98    Pulse 87    Temp 97.9 F (36.6 C) (Oral)    Resp 16    Ht 5\' 3"  (1.6 m)    Wt 76.7 kg    LMP 10/18/2015    SpO2 98%    BMI 29.94 kg/m  Gen:   Awake, no distress   Resp:  Normal effort  MSK:   Moves extremities without difficulty  Other:    Medical Decision Making  Medically screening exam initiated at 12:20 PM.  Appropriate orders placed.  [name] was informed that the remainder of the evaluation will be completed by another provider, this initial triage assessment does not replace that evaluation, and the importance of remaining in the ED until their evaluation is complete.     Bud Face, PA-C 01/05/22 1226

## 2022-01-08 ENCOUNTER — Ambulatory Visit: Payer: BC Managed Care – PPO

## 2022-01-08 ENCOUNTER — Other Ambulatory Visit: Payer: Self-pay

## 2022-01-08 DIAGNOSIS — G8929 Other chronic pain: Secondary | ICD-10-CM

## 2022-01-08 DIAGNOSIS — M79671 Pain in right foot: Secondary | ICD-10-CM | POA: Diagnosis not present

## 2022-01-08 DIAGNOSIS — R262 Difficulty in walking, not elsewhere classified: Secondary | ICD-10-CM

## 2022-01-08 NOTE — Therapy (Signed)
[facility] @ [facility] [address] [place], [place], [address] Phone: [phone]   Fax:  [phone]  Physical Therapy Treatment  Patient Details  Name: [name] MRN: [id number] Date of Birth: 08/17/1961 Referring Provider (PT): Antoine Primas, DO   Encounter Date: 01/08/2022   PT End of Session - 01/08/22 1522     Visit Number 4    Date for PT Re-Evaluation 02/07/22    Authorization Type BCBS    PT Start Time 1447    PT Stop Time 1520    PT Time Calculation (min) 33 min    Activity Tolerance Patient tolerated treatment well    Behavior During Therapy [facility] for tasks assessed/performed             Past Medical History:  Diagnosis Date   Allergy    Dr Stevphen [place]   Cardiac arrhythmia    Depression    Diabetes mellitus without complication [facility])    Fibroids    uterine sees Dr. Neva Seat in [place]    Genital warts    Heart murmur    High cholesterol    Hypertension    Irritable bowel syndrome (IBS)    Low back pain    has a herniated lumbar disc sees Dr August Saucer    Past Surgical History:  Procedure Laterality Date   ABDOMINOPLASTY  04/2013   BREAST BIOPSY Right 06/2014   COLONOSCOPY  02/10/2015   per Dr. Rhea Belton, clear, repeat in 10 yrs    LUMBAR LAMINECTOMY  2010   L3-4 on 02-24-10 per Dr Otelia Sergeant    MYOMECTOMY     laser ablation of uterine fibroids per Dr Dareen Piano 2000   TUBAL LIGATION      There were no vitals filed for this visit.   Subjective Assessment - 01/08/22 1447     Subjective I have been sick.  Feeling better now and taking antibiotics.  My heel is feeling better.  I feel 70% better.    Patient Stated Goals To eliminate pain and avoid injections or more invasive procedures.  She would like to get back to her prior level of function.    Currently in Pain? No/denies                               [facility] Adult PT Treatment/Exercise - 01/08/22 0001       Manual Therapy    Manual Therapy Soft tissue mobilization;Passive ROM;Myofascial release   deep tissue to painful area   Manual therapy comments PROM all planes right ankle, cross friction massage to medial plantar fascia right heel, calcaneal tilts x 10 min      Ankle Exercises: Supine   T-Band df,pf, inv, ev with red  tband x 20 each      Ankle Exercises: Standing   Rocker Board 3 minutes      Ankle Exercises: Aerobic   Nustep Level 2x 6 minutes-PT present to discuss progress                       PT Short Term Goals - 01/08/22 1524       PT SHORT TERM GOAL #1   Title ind with initial HEP    Status Achieved      PT SHORT TERM GOAL #2   Title --    Baseline --      PT SHORT TERM GOAL #3  Title --               PT Long Term Goals - 01/08/22 1524       PT LONG TERM GOAL #1   Title ind with advanced HEP    Status On-going      PT LONG TERM GOAL #3   Title Patient able to resume normal workouts at the gym and ride her bike.    Status On-going      PT LONG TERM GOAL #4   Title Patient to report 80% improvement in overall symptoms.    Baseline 70%    Status On-going                   Plan - 01/08/22 1521     Clinical Impression Statement Pt with lapse in treatment due to illness.  Pt reports 70% overall improvement in Rt heel symptoms since the start of care.  Exercises and manual therapy have been helping.  Pt reports pain only when wearing the wrong shoes. Pt advanced to red band with ankle 4 ways.  Pt with tenderness at medial calcaneous and responded well to manual therapy with minimal tenderness with deep pressure.   Pt will continue to benefit from skilled PT to address Rt heel pain.    PT Frequency 2x / week    PT Duration 8 weeks    PT Treatment/Interventions ADLs/Self Care Home Management;Aquatic Therapy;Moist Heat;Iontophoresis 4mg /ml Dexamethasone;Electrical Stimulation;Cryotherapy;Ultrasound;Gait training;Therapeutic exercise;Therapeutic  activities;Functional mobility training;Stair training;Balance training;Neuromuscular re-education;Patient/family education;Manual techniques;Passive range of motion;Vasopneumatic Device;Taping;Dry needling    PT Next Visit Plan continue flexibility, strength and manual work as needed    PT Home Exercise Plan [email or web address]    Recommended Other Services initial cert is signed    Consulted and Agree with Plan of Care Patient             Patient will benefit from skilled therapeutic intervention in order to improve the following deficits and impairments:  Abnormal gait, Difficulty walking, Pain, Impaired flexibility, Decreased strength  Visit Diagnosis: Chronic pain of right heel  Difficulty in walking, not elsewhere classified     Problem List Patient Active Problem List   Diagnosis Date Noted   Degenerative cervical disc 02/02/2021   Rotator cuff syndrome of right shoulder 12/07/2020   COVID-19 virus infection 09/20/2020   Well woman exam with routine gynecological exam 01/07/2020   Breast pain, right 01/07/2020   Depression with anxiety 10/21/2019   Hypercholesterolemia 01/19/2019   Left lumbar radiculopathy 12/25/2018   Right arm pain 11/26/2018   Low back pain 11/26/2018   Left knee pain 10/01/2018   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   Type 2 diabetes mellitus without complication, without long-term current use of insulin (HCC) 02/22/2017   Loss of transverse plantar arch 10/31/2016   Pronation deformity of ankle, acquired 10/31/2016   Patellofemoral pain syndrome 09/23/2015   OSA (obstructive sleep apnea) 03/31/2014   Asthma, chronic 08/14/2013   Leg edema 08/14/2013   Osteoarthritis 09/11/2010   GERD 03/17/2010   SCIATICA 08/30/2009   TROCHANTERIC BURSITIS 08/09/2009   LEG PAIN, BILATERAL 09/30/2007   Essential hypertension 09/22/2007   Lorrene Reid, PT 01/08/22 3:26 PM   [facility] @ [facility] [address] [place], [place], [address] Phone: [phone]   Fax:  [phone]  Name: MADALINA OLEAR MRN: [id number] Date of Birth: September 26, 1961

## 2022-01-11 ENCOUNTER — Other Ambulatory Visit: Payer: Self-pay

## 2022-01-11 ENCOUNTER — Ambulatory Visit: Payer: BC Managed Care – PPO

## 2022-01-11 DIAGNOSIS — R262 Difficulty in walking, not elsewhere classified: Secondary | ICD-10-CM

## 2022-01-11 DIAGNOSIS — M79671 Pain in right foot: Secondary | ICD-10-CM | POA: Diagnosis not present

## 2022-01-11 DIAGNOSIS — G8929 Other chronic pain: Secondary | ICD-10-CM

## 2022-01-12 NOTE — Therapy (Signed)
[facility]ty [facility] @ [facility] [address] [place], [place], [address] Phone: [phone]   Fax:  [phone]  Physical Therapy Treatment  Patient Details  Name: [name] MRN: [id number] Date of Birth: August 13, 1961 Referring Provider (PT): Antoine Primas, DO   Encounter Date: 01/11/2022   PT End of Session - 01/11/22 1544     Visit Number 5    Date for PT Re-Evaluation 02/07/22    Authorization Type BCBS    PT Start Time 1445    PT Stop Time 1528    PT Time Calculation (min) 43 min    Activity Tolerance Patient tolerated treatment well    Behavior During Therapy [facility] for tasks assessed/performed             Past Medical History:  Diagnosis Date   Allergy    Dr Stevphen [place]   Cardiac arrhythmia    Depression    Diabetes mellitus without complication [facility])    Fibroids    uterine sees Dr. Neva Seat in [place]    Genital warts    Heart murmur    High cholesterol    Hypertension    Irritable bowel syndrome (IBS)    Low back pain    has a herniated lumbar disc sees Dr August Saucer    Past Surgical History:  Procedure Laterality Date   ABDOMINOPLASTY  04/2013   BREAST BIOPSY Right 06/2014   COLONOSCOPY  02/10/2015   per Dr. Rhea Belton, clear, repeat in 10 yrs    LUMBAR LAMINECTOMY  2010   L3-4 on 02-24-10 per Dr Otelia Sergeant    MYOMECTOMY     laser ablation of uterine fibroids per Dr Dareen Piano 2000   TUBAL LIGATION      There were no vitals filed for this visit.   Subjective Assessment - 01/11/22 1450     Subjective Patient states she is doing pretty good.  "I don't feel like I have to wear my special shoes all the time".    Limitations Standing;Walking    How long can you sit comfortably? na    How long can you stand comfortably? 30 min    How long can you walk comfortably? 30 min    Diagnostic tests US reveals mild plantar fascitis and bone spur    Currently in Pain? Yes    Pain Score 4     Pain Location Foot    Pain  Orientation Medial    Pain Descriptors / Indicators Discomfort    Pain Type Chronic pain    Pain Onset More than a month ago                               [facility] Adult PT Treatment/Exercise - 01/11/22 0001       Manual Therapy   Manual Therapy Soft tissue mobilization;Passive ROM;Myofascial release   deep tissue to painful area   Manual therapy comments PROM all planes right ankle, cross friction massage to medial plantar fascia right heel, calcaneal tilts x 10 min      Ankle Exercises: Seated   Towel Crunch 5 reps    Towel Inversion/Eversion 5 reps    Marble Pickup did paper towel pick up x 10    Heel Raises 20 reps    Toe Raise 20 reps    BAPS Sitting;Level 1;Other (comment)   20 reps     Ankle Exercises: Standing   Rocker Board 3 minutes  Rebounder SLS ball toss / floor 3 direction    Other Standing Ankle Exercises cone touches with cone on mat table (no balance pad)    Other Standing Ankle Exercises step up fwd and lateral on BOSU right      Ankle Exercises: Stretches   Plantar Fascia Stretch 5 reps;10 seconds   off edge of step                      PT Short Term Goals - 01/08/22 1524       PT SHORT TERM GOAL #1   Title ind with initial HEP    Status Achieved      PT SHORT TERM GOAL #2   Title --    Baseline --      PT SHORT TERM GOAL #3   Title --               PT Long Term Goals - 01/08/22 1524       PT LONG TERM GOAL #1   Title ind with advanced HEP    Status On-going      PT LONG TERM GOAL #3   Title Patient able to resume normal workouts at the gym and ride her bike.    Status On-going      PT LONG TERM GOAL #4   Title Patient to report 80% improvement in overall symptoms.    Baseline 70%    Status On-going                   Plan - 01/11/22 1546     Clinical Impression Statement [name] is responding very well to current POC.  She is much less tender to palpation and is able to tolerate  fairly aggressive manual cross friction and soft tissue mobilization.  She is quite steady on single leg dynamic balance activities but does report fatigue in the arch.  She would benefit from continued skilled PT for right ankle stability and strengthening along with ROM to restore proper mechanics of the foot and ankle and reduce plantar fascia pain.    Examination-Activity Limitations Stairs;Stand    Examination-Participation Restrictions Occupation;Cleaning    Stability/Clinical Decision Making Stable/Uncomplicated    Clinical Decision Making Low    Rehab Potential Good    PT Frequency 2x / week    PT Duration 8 weeks    PT Treatment/Interventions ADLs/Self Care Home Management;Aquatic Therapy;Moist Heat;Iontophoresis 4mg /ml Dexamethasone;Electrical Stimulation;Cryotherapy;Ultrasound;Gait training;Therapeutic exercise;Therapeutic activities;Functional mobility training;Stair training;Balance training;Neuromuscular re-education;Patient/family education;Manual techniques;Passive range of motion;Vasopneumatic Device;Taping;Dry needling    PT Next Visit Plan continue flexibility, strength and manual work as needed    PT Home Exercise Plan [email or web address]    Consulted and Agree with Plan of Care Patient             Patient will benefit from skilled therapeutic intervention in order to improve the following deficits and impairments:  Abnormal gait, Difficulty walking, Pain, Impaired flexibility, Decreased strength  Visit Diagnosis: Chronic pain of right heel  Difficulty in walking, not elsewhere classified     Problem List Patient Active Problem List   Diagnosis Date Noted   Degenerative cervical disc 02/02/2021   Rotator cuff syndrome of right shoulder 12/07/2020   COVID-19 virus infection 09/20/2020   Well woman exam with routine gynecological exam 01/07/2020   Breast pain, right 01/07/2020   Depression with anxiety 10/21/2019   Hypercholesterolemia 01/19/2019   Left lumbar  radiculopathy 12/25/2018   Right arm pain 11/26/2018  Low back pain 11/26/2018   Left knee pain 10/01/2018   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   Type 2 diabetes mellitus without complication, without long-term current use of insulin (HCC) 02/22/2017   Loss of transverse plantar arch 10/31/2016   Pronation deformity of ankle, acquired 10/31/2016   Patellofemoral pain syndrome 09/23/2015   OSA (obstructive sleep apnea) 03/31/2014   Asthma, chronic 08/14/2013   Leg edema 08/14/2013   Osteoarthritis 09/11/2010   GERD 03/17/2010   SCIATICA 08/30/2009   TROCHANTERIC BURSITIS 08/09/2009   LEG PAIN, BILATERAL 09/30/2007   Essential hypertension 09/22/2007    Victorino Dike B. Ivory Maduro, PT 01/12/2311:24 AM   [facility] Outpatient [facility]ty Re[facility] @ [facility] [address] [place], [place], [address] Phone: [phone]   Fax:  [phone]  Name: [name] MRN: [id number] Date of Birth: [birth year]

## 2022-01-15 ENCOUNTER — Ambulatory Visit: Payer: BC Managed Care – PPO

## 2022-01-18 ENCOUNTER — Other Ambulatory Visit: Payer: Self-pay

## 2022-01-18 ENCOUNTER — Ambulatory Visit: Payer: BC Managed Care – PPO | Attending: Family Medicine

## 2022-01-18 DIAGNOSIS — G8929 Other chronic pain: Secondary | ICD-10-CM | POA: Diagnosis present

## 2022-01-18 DIAGNOSIS — R262 Difficulty in walking, not elsewhere classified: Secondary | ICD-10-CM | POA: Diagnosis present

## 2022-01-18 DIAGNOSIS — M79671 Pain in right foot: Secondary | ICD-10-CM | POA: Insufficient documentation

## 2022-01-18 DIAGNOSIS — M6281 Muscle weakness (generalized): Secondary | ICD-10-CM | POA: Diagnosis present

## 2022-01-18 NOTE — Therapy (Signed)
GERD 03/17/2010   SCIATICA 08/30/2009   TROCHANTERIC BURSITIS 08/09/2009   LEG PAIN, BILATERAL 09/30/2007   Essential hypertension 09/22/2007    Anderson Malta B. Shaye Lagace, PT 02/02/233:44 PM   [facility] [place], [place], [address] Phone: [phone]   Fax:  [phone]  Name: [name] MRN: [id number] Date of Birth: 1961-02-12  [facility] [place], [place], [address] Phone: [phone]   Fax:  [phone]  Physical Therapy Treatment  Patient Details  Name: [name] MRN: [id number] Date of Birth: 04-19-1961 Referring Provider (PT): Hulan Saas, DO   Encounter Date: 01/18/2022   PT End of Session - 01/18/22 1458     Visit Number 6    Date for PT Re-Evaluation 02/07/22    Authorization Type BCBS    PT Start Time 1445    PT Stop Time 1526    PT Time Calculation (min) 41 min    Activity Tolerance Patient tolerated treatment well    Behavior During Therapy [facility] for tasks assessed/performed             Past Medical History:  Diagnosis Date   Allergy    Dr Caprice Red   Cardiac arrhythmia    Depression    Diabetes mellitus without complication [facility])    Fibroids    uterine sees Dr. Carlota Raspberry in [place]    Genital warts    Heart murmur    High cholesterol    Hypertension    Irritable bowel syndrome (IBS)    Low back pain    has a herniated lumbar disc sees Dr Marlou Sa    Past Surgical History:  Procedure Laterality Date   ABDOMINOPLASTY  04/2013   BREAST BIOPSY Right 06/2014   COLONOSCOPY  02/10/2015   per Dr. Hilarie Fredrickson, clear, repeat in 10 yrs    LUMBAR LAMINECTOMY  2010   L3-4 on 02-24-10 per Dr Louanne Skye    MYOMECTOMY     laser ablation of uterine fibroids per Dr Ouida Sills 2000   TUBAL LIGATION      There were no vitals filed for this visit.   Subjective Assessment - 01/18/22 1453     Subjective Patient states she is having more pain today.  She rates her pain at 8/10.  She admits she has been wearing more normal shoes and was up on her feet a lot more today.  She  comes in wearing flat UGG type boots today.    Limitations Standing;Walking    How long can you sit comfortably? na    How long can you stand comfortably? 30 min    How long can you walk comfortably? 30 min    Diagnostic tests US reveals mild plantar  fascitis and bone spur    Patient Stated Goals To eliminate pain and avoid injections or more invasive procedures.  She would like to get back to her prior level of function.    Currently in Pain? Yes    Pain Score 8     Pain Location Heel    Pain Orientation Right    Pain Descriptors / Indicators Aching;Burning    Pain Type Chronic pain    Pain Onset More than a month ago    Pain Frequency Intermittent                               [facility] Adult PT Treatment/Exercise - 01/18/22 0001       Exercises   Exercises Ankle      Modalities   Modalities Iontophoresis      Iontophoresis   Type of Iontophoresis Dexamethasone    Location right heel    Dose 4 mg/mL    Time 4-6 hour patch      Manual Therapy  [facility] [place], [place], [address] Phone: [phone]   Fax:  [phone]  Physical Therapy Treatment  Patient Details  Name: [name] MRN: [id number] Date of Birth: 04-19-1961 Referring Provider (PT): Hulan Saas, DO   Encounter Date: 01/18/2022   PT End of Session - 01/18/22 1458     Visit Number 6    Date for PT Re-Evaluation 02/07/22    Authorization Type BCBS    PT Start Time 1445    PT Stop Time 1526    PT Time Calculation (min) 41 min    Activity Tolerance Patient tolerated treatment well    Behavior During Therapy [facility] for tasks assessed/performed             Past Medical History:  Diagnosis Date   Allergy    Dr Caprice Red   Cardiac arrhythmia    Depression    Diabetes mellitus without complication [facility])    Fibroids    uterine sees Dr. Carlota Raspberry in [place]    Genital warts    Heart murmur    High cholesterol    Hypertension    Irritable bowel syndrome (IBS)    Low back pain    has a herniated lumbar disc sees Dr Marlou Sa    Past Surgical History:  Procedure Laterality Date   ABDOMINOPLASTY  04/2013   BREAST BIOPSY Right 06/2014   COLONOSCOPY  02/10/2015   per Dr. Hilarie Fredrickson, clear, repeat in 10 yrs    LUMBAR LAMINECTOMY  2010   L3-4 on 02-24-10 per Dr Louanne Skye    MYOMECTOMY     laser ablation of uterine fibroids per Dr Ouida Sills 2000   TUBAL LIGATION      There were no vitals filed for this visit.   Subjective Assessment - 01/18/22 1453     Subjective Patient states she is having more pain today.  She rates her pain at 8/10.  She admits she has been wearing more normal shoes and was up on her feet a lot more today.  She  comes in wearing flat UGG type boots today.    Limitations Standing;Walking    How long can you sit comfortably? na    How long can you stand comfortably? 30 min    How long can you walk comfortably? 30 min    Diagnostic tests US reveals mild plantar  fascitis and bone spur    Patient Stated Goals To eliminate pain and avoid injections or more invasive procedures.  She would like to get back to her prior level of function.    Currently in Pain? Yes    Pain Score 8     Pain Location Heel    Pain Orientation Right    Pain Descriptors / Indicators Aching;Burning    Pain Type Chronic pain    Pain Onset More than a month ago    Pain Frequency Intermittent                               [facility] Adult PT Treatment/Exercise - 01/18/22 0001       Exercises   Exercises Ankle      Modalities   Modalities Iontophoresis      Iontophoresis   Type of Iontophoresis Dexamethasone    Location right heel    Dose 4 mg/mL    Time 4-6 hour patch      Manual Therapy

## 2022-01-18 NOTE — Patient Instructions (Signed)
Instructed patient in purpose of iontophoresis and wearing time.

## 2022-01-31 ENCOUNTER — Ambulatory Visit: Payer: BC Managed Care – PPO

## 2022-01-31 ENCOUNTER — Other Ambulatory Visit: Payer: Self-pay

## 2022-01-31 ENCOUNTER — Encounter (HOSPITAL_COMMUNITY): Payer: Self-pay | Admitting: Radiology

## 2022-01-31 DIAGNOSIS — G8929 Other chronic pain: Secondary | ICD-10-CM

## 2022-01-31 DIAGNOSIS — R262 Difficulty in walking, not elsewhere classified: Secondary | ICD-10-CM

## 2022-01-31 DIAGNOSIS — M6281 Muscle weakness (generalized): Secondary | ICD-10-CM

## 2022-01-31 DIAGNOSIS — M79671 Pain in right foot: Secondary | ICD-10-CM | POA: Diagnosis not present

## 2022-01-31 NOTE — Therapy (Signed)
[facility] @ [facility] [address] [place], [place], [address] Phone: [phone]   Fax:  [phone]  Physical Therapy Treatment  Patient Details  Name: RAYSHAWN HAYS MRN: [id number] Date of Birth: [birth year] Referring Provider (PT): Antoine Primas, DO   Encounter Date: 01/31/2022   PT End of Session - 01/31/22 1527     Visit Number 7    Date for PT Re-Evaluation 02/07/22    Authorization Type BCBS    PT Start Time 1445    PT Stop Time 1525    PT Time Calculation (min) 40 min    Activity Tolerance Patient tolerated treatment well    Behavior During Therapy [facility] for tasks assessed/performed             Past Medical History:  Diagnosis Date   Allergy    Dr Stevphen [place]   Cardiac arrhythmia    Depression    Diabetes mellitus without complication [facility])    Fibroids    uterine sees Dr. Neva Seat in [place]    Genital warts    Heart murmur    High cholesterol    Hypertension    Irritable bowel syndrome (IBS)    Low back pain    has a herniated lumbar disc sees Dr August Saucer    Past Surgical History:  Procedure Laterality Date   ABDOMINOPLASTY  04/2013   BREAST BIOPSY Right 06/2014   COLONOSCOPY  02/10/2015   per Dr. Rhea Belton, clear, repeat in 10 yrs    LUMBAR LAMINECTOMY  2010   L3-4 on 02-24-10 per Dr Otelia Sergeant    MYOMECTOMY     laser ablation of uterine fibroids per Dr Dareen Piano 2000   TUBAL LIGATION      There were no vitals filed for this visit.   Subjective Assessment - 01/31/22 1450     Subjective Patient states she fell but only suffered some minor aches and pains.  Her foot pain is 2/10.    Limitations Standing;Walking    How long can you sit comfortably? na    How long can you stand comfortably? 30 min    How long can you walk comfortably? 30 min    Diagnostic tests US reveals mild plantar fascitis and bone spur    Patient Stated Goals To eliminate pain and avoid injections or more invasive procedures.  She  would like to get back to her prior level of function.    Currently in Pain? Yes    Pain Score 6     Pain Location Heel    Pain Orientation Right    Pain Descriptors / Indicators Aching    Pain Type Chronic pain    Pain Onset More than a month ago                               [facility] Adult PT Treatment/Exercise - 01/31/22 0001       Manual Therapy   Manual Therapy Soft tissue mobilization;Passive ROM;Myofascial release   deep tissue to painful area   Manual therapy comments PROM all planes right ankle, cross friction massage to medial plantar fascia right heel, calcaneal tilts x 10 min      Ankle Exercises: Stretches   Plantar Fascia Stretch 5 reps;10 seconds   using prostretch     Ankle Exercises: Standing   BAPS Level 3;Sitting    SLS on green balance pod x 10 hold 10 sec  Other Standing Ankle Exercises cone touches with cone on mat table (no balance pad)                     PT Education - 01/31/22 1528     Education Details Educated on pain cycle and possibly needing injection to interrupt pain cycle to allow more effective PT.  Patient states she may contact MD to set up appt.    Person(s) Educated Patient    Methods Explanation    Comprehension Verbalized understanding              PT Short Term Goals - 01/08/22 1524       PT SHORT TERM GOAL #1   Title ind with initial HEP    Status Achieved      PT SHORT TERM GOAL #2   Title --    Baseline --      PT SHORT TERM GOAL #3   Title --               PT Long Term Goals - 01/08/22 1524       PT LONG TERM GOAL #1   Title ind with advanced HEP    Status On-going      PT LONG TERM GOAL #3   Title Patient able to resume normal workouts at the gym and ride her bike.    Status On-going      PT LONG TERM GOAL #4   Title Patient to report 80% improvement in overall symptoms.    Baseline 70%    Status On-going                   Plan - 01/31/22 1529      Clinical Impression Statement Patient is continuing to have exacerbations of pain intermittantly despite PT interventions.  Compliance with HEP is satiisfactory.  She would benefit from being consistent with HEP and diligent stretching.  She will be contacting MD to discuss injection.  We will progress accordingly.    Examination-Activity Limitations Stairs;Stand    Examination-Participation Restrictions Occupation;Cleaning    Stability/Clinical Decision Making Stable/Uncomplicated    Clinical Decision Making Low    Rehab Potential Good    PT Frequency 2x / week    PT Duration 8 weeks    PT Treatment/Interventions ADLs/Self Care Home Management;Aquatic Therapy;Moist Heat;Iontophoresis 4mg /ml Dexamethasone;Electrical Stimulation;Cryotherapy;Ultrasound;Gait training;Therapeutic exercise;Therapeutic activities;Functional mobility training;Stair training;Balance training;Neuromuscular re-education;Patient/family education;Manual techniques;Passive range of motion;Vasopneumatic Device;Taping;Dry needling    PT Next Visit Plan continue flexibility, strength and manual work as needed    PT Home Exercise Plan [email or web address]    Consulted and Agree with Plan of Care Patient             Patient will benefit from skilled therapeutic intervention in order to improve the following deficits and impairments:  Abnormal gait, Difficulty walking, Pain, Impaired flexibility, Decreased strength  Visit Diagnosis: Chronic pain of right heel  Difficulty in walking, not elsewhere classified  Muscle weakness (generalized)     Problem List Patient Active Problem List   Diagnosis Date Noted   Degenerative cervical disc 02/02/2021   Rotator cuff syndrome of right shoulder 12/07/2020   COVID-19 virus infection 09/20/2020   Well woman exam with routine gynecological exam 01/07/2020   Breast pain, right 01/07/2020   Depression with anxiety 10/21/2019   Hypercholesterolemia 01/19/2019   Left lumbar radiculopathy  12/25/2018   Right arm pain 11/26/2018   Low back pain 11/26/2018   Left knee pain 10/01/2018  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   Type 2 diabetes mellitus without complication, without long-term current use of insulin (HCC) 02/22/2017   Loss of transverse plantar arch 10/31/2016   Pronation deformity of ankle, acquired 10/31/2016   Patellofemoral pain syndrome 09/23/2015   OSA (obstructive sleep apnea) 03/31/2014   Asthma, chronic 08/14/2013   Leg edema 08/14/2013   Osteoarthritis 09/11/2010   GERD 03/17/2010   SCIATICA 08/30/2009   TROCHANTERIC BURSITIS 08/09/2009   LEG PAIN, BILATERAL 09/30/2007   Essential hypertension 09/22/2007    Victorino Dike B. Lilyan Prete, PT 02/15/233:32 PM   [facility] @ [facility] [address] [place], [place], [address] Phone: [phone]   Fax:  [phone]  Name: MARSHA HARPS MRN: [id number] Date of Birth: 1961-11-21

## 2022-02-12 ENCOUNTER — Encounter: Payer: Self-pay | Admitting: Family Medicine

## 2022-02-12 ENCOUNTER — Ambulatory Visit (INDEPENDENT_AMBULATORY_CARE_PROVIDER_SITE_OTHER): Payer: BC Managed Care – PPO | Admitting: Family Medicine

## 2022-02-12 VITALS — BP 102/68 | HR 79 | Temp 98.6°F | Ht 63.0 in | Wt 168.0 lb

## 2022-02-12 DIAGNOSIS — M79605 Pain in left leg: Secondary | ICD-10-CM | POA: Diagnosis not present

## 2022-02-12 DIAGNOSIS — J189 Pneumonia, unspecified organism: Secondary | ICD-10-CM | POA: Diagnosis not present

## 2022-02-12 MED ORDER — PHENTERMINE HCL 37.5 MG PO CAPS: 37.5000 mg | ORAL_CAPSULE | ORAL | 1 refills | Status: DC

## 2022-02-12 NOTE — Progress Notes (Signed)
Subjective:    Patient ID: [name], female    DOB: 1961-11-07, 61 y.o.   MRN: [id number]  HPI Here for several issues. First she was seen in the ED on 01-05-22 for fevers, coughing, and chest pain. No SOB. Xrays revealed a RLL pneumonia. She was treated with 10 days of Doxycycline, and she recovered nicely. She now has no chest symptoms at all. Also about 2 weeks ago while at work she tripped over something and fell forward. She was able to catch herself with her hands and afterwards she had some soreness as expected. About that same time she began to have painful cramps in the left calf, and her left calf and foot began to swel. Since then the severe cramps comes and go, but she always has a feeling of tightness in the calf.    Review of Systems  Constitutional: Negative.   Respiratory: Negative.  Negative for shortness of breath.   Cardiovascular:  Positive for leg swelling. Negative for chest pain and palpitations.  Musculoskeletal:  Positive for myalgias.      Objective:   Physical Exam Constitutional:      Appearance: Normal appearance. She is not ill-appearing.  Cardiovascular:     Rate and Rhythm: Normal rate and regular rhythm.     Pulses: Normal pulses.     Heart sounds: Normal heart sounds.  Pulmonary:     Effort: Pulmonary effort is normal.     Breath sounds: Normal breath sounds.  Musculoskeletal:     Comments: The left calf and foot are mildly swollen. She is tender in the popliteal space and in the posterior calf. No cords are felt. Denna Haggard is positive on the left.   Neurological:     Mental Status: She is alert.          Assessment & Plan:  First her CAP has resolved. Second she has swelling and pain in the left calf that is most likely due to a muscle strain. If so I told her to take it easy and this should heal in the next 4-6 weeks. However we need to rule out a DVT, so we will set up a venous doppler asap. Gershon Crane, MD

## 2022-02-13 ENCOUNTER — Ambulatory Visit (HOSPITAL_COMMUNITY)
Admission: RE | Admit: 2022-02-13 | Discharge: 2022-02-13 | Disposition: A | Discharge status: Home / Self Care | Payer: BC Managed Care – PPO | Source: Ambulatory Visit | Attending: Family Medicine | Admitting: Family Medicine

## 2022-02-13 ENCOUNTER — Other Ambulatory Visit: Payer: Self-pay

## 2022-02-13 ENCOUNTER — Telehealth: Payer: Self-pay

## 2022-02-13 DIAGNOSIS — M79605 Pain in left leg: Secondary | ICD-10-CM | POA: Insufficient documentation

## 2022-02-13 NOTE — Telephone Encounter (Signed)
Message sent to Dr Sarajane Jews

## 2022-02-19 ENCOUNTER — Encounter: Payer: Self-pay | Admitting: Family Medicine

## 2022-02-21 ENCOUNTER — Other Ambulatory Visit: Payer: Self-pay

## 2022-02-21 ENCOUNTER — Ambulatory Visit: Payer: Self-pay

## 2022-02-21 ENCOUNTER — Ambulatory Visit (INDEPENDENT_AMBULATORY_CARE_PROVIDER_SITE_OTHER): Payer: BC Managed Care – PPO | Admitting: Family Medicine

## 2022-02-21 VITALS — BP 100/72 | HR 90 | Ht 63.0 in | Wt 173.0 lb

## 2022-02-21 DIAGNOSIS — M722 Plantar fascial fibromatosis: Secondary | ICD-10-CM | POA: Diagnosis not present

## 2022-02-21 DIAGNOSIS — M79671 Pain in right foot: Secondary | ICD-10-CM

## 2022-02-21 DIAGNOSIS — G8929 Other chronic pain: Secondary | ICD-10-CM

## 2022-02-21 NOTE — Patient Instructions (Addendum)
Thank you for coming in today.  ? ?You received a steroid injection in your right foot today. Seek immediate medical attention if the joint becomes red, extremely painful, or is oozing fluid.  ? ?Try wearing an arch strap night splint to bed ? ?Recheck back 1 month ?

## 2022-02-21 NOTE — Progress Notes (Signed)
? ?I, Peterson Lombard, LAT, ATC acting as a scribe for Lynne Leader, MD. ? ?[name] is a 61 y.o. female who presents to [facility] today for R foot pain. Pt was previously seen by Dr. Tamala Julian on 12/12/21 for chronic R heel pain, dx as plantar fascitis, and was referred to PT, completing 7 visits, the last of which was on 01/31/22. Pt sent a MyChart message on 02/19/22 reporting no benefit from PT and asked about next treatment options. Today, pt reports R heel pain has not improved. Pt locates pain to plantar medial aspect of the R heel. ? ?Dx imaging: 11/17/21 R ankle XR ? 10/25/20 R foot XR ? ?Pertinent review of systems: No fevers or chills ? ?Relevant historical information: Hypertension and well-controlled diabetes. ? ? ?Exam:  ?BP 100/72   Pulse 90   Ht '5\' 3"'$  (1.6 m)   Wt 173 lb (78.5 kg)   LMP 10/18/2015   SpO2 98%   BMI 30.65 kg/m?  ?General: Well Developed, well nourished, and in no acute distress.  ? ?MSK: Right foot: Normal. ?Tender palpation medial aspect of the plantar calcaneus. ?Normal foot and ankle motion. ?Pulses capillary refill and sensation are intact distally. ? ? ?Lab and Radiology Results ? ?Procedure: Real-time Ultrasound Guided Injection of right plantar fascia origin at calcaneus ?Device: Philips Affiniti 50G ?Images permanently stored and available for review in PACS ?Verbal informed consent obtained.  Discussed risks and benefits of procedure. Warned about infection bleeding damage to structures skin hypopigmentation and fat atrophy among others. ?Patient expresses understanding and agreement ?Time-out conducted.   ?Noted no overlying erythema, induration, or other signs of local infection.   ?Skin prepped in a sterile fashion.   ?Local anesthesia: Topical Ethyl chloride.   ?With sterile technique and under real time ultrasound guidance: 40 mg of Kenalog and 2 mm of Marcaine injected into the plantar fascia origin at medial aspect of plantar  calcaneus. Fluid seen entering the fascial origin.   ? ?However just as I started the injection Ramsha grabbed my left arm and I immediately let go of the needle and then withdrew the needle for safety. ?We discussed safety rules around injection and Cartier elected to continue the procedure.  She was able to more easily tolerate a second attempt at an injection as above. ? ? ?Pain immediately resolved suggesting accurate placement of the medication.   ?Advised to call if fevers/chills, erythema, induration, drainage, or persistent bleeding.   ?Images permanently stored and available for review in the ultrasound unit.  ?Impression: Technically successful ultrasound guided injection. ? ? ? ? ? ? ? ?Assessment and Plan: ?61 y.o. female with right Planter fasciitis not improving with conservative management strategies including physical therapy.  Plan for injection as above and a trial of night splint. ?Recheck in 1 month with myself or Dr. Tamala Julian. ? ? ?PDMP not reviewed this encounter. ?Orders Placed This Encounter  ?Procedures  ? [place] LIMITED JOINT SPACE STRUCTURES LOW RIGHT(NO LINKED CHARGES)  ?  Order Specific Question:   Reason for Exam (SYMPTOM  OR DIAGNOSIS REQUIRED)  ?  Answer:   right foot pain  ?  Order Specific Question:   Preferred imaging location?  ?  Answer:   [place]  ? ?No orders of the defined types were placed in this encounter. ? ? ? ?Discussed warning signs or symptoms. Please see discharge instructions. Patient expresses understanding. ? ? ?The above documentation has been reviewed and  is accurate and complete Lynne Leader, M.D. ? ? ?

## 2022-02-27 ENCOUNTER — Other Ambulatory Visit: Payer: Self-pay | Admitting: Obstetrics and Gynecology

## 2022-02-27 DIAGNOSIS — N6310 Unspecified lump in the right breast, unspecified quadrant: Secondary | ICD-10-CM

## 2022-02-28 ENCOUNTER — Ambulatory Visit
Admission: RE | Admit: 2022-02-28 | Discharge: 2022-02-28 | Disposition: A | Discharge status: Home / Self Care | Payer: BC Managed Care – PPO | Source: Ambulatory Visit | Attending: Obstetrics and Gynecology | Admitting: Obstetrics and Gynecology

## 2022-02-28 DIAGNOSIS — N6310 Unspecified lump in the right breast, unspecified quadrant: Secondary | ICD-10-CM

## 2022-03-12 ENCOUNTER — Other Ambulatory Visit: Payer: BC Managed Care – PPO

## 2022-03-20 ENCOUNTER — Other Ambulatory Visit: Payer: BC Managed Care – PPO

## 2022-03-20 ENCOUNTER — Ambulatory Visit: Payer: BC Managed Care – PPO | Admitting: Family Medicine

## 2022-05-03 ENCOUNTER — Encounter: Payer: BC Managed Care – PPO | Admitting: Family Medicine

## 2022-05-07 ENCOUNTER — Encounter: Payer: Self-pay | Admitting: Family Medicine

## 2022-05-07 ENCOUNTER — Ambulatory Visit (INDEPENDENT_AMBULATORY_CARE_PROVIDER_SITE_OTHER): Payer: BC Managed Care – PPO | Admitting: Family Medicine

## 2022-05-07 VITALS — BP 128/80 | HR 92 | Temp 97.9°F | Ht 63.25 in | Wt 170.0 lb

## 2022-05-07 DIAGNOSIS — Z0000 Encounter for general adult medical examination without abnormal findings: Secondary | ICD-10-CM | POA: Diagnosis not present

## 2022-05-07 LAB — CBC WITH DIFFERENTIAL/PLATELET
Basophils Absolute: 0 10*3/uL (ref 0.0–0.1)
Basophils Relative: 0.5 % (ref 0.0–3.0)
Eosinophils Absolute: 0.2 10*3/uL (ref 0.0–0.7)
Eosinophils Relative: 2.2 % (ref 0.0–5.0)
HCT: 40.7 % (ref 36.0–46.0)
Hemoglobin: 13.6 g/dL (ref 12.0–15.0)
Lymphocytes Relative: 35.4 % (ref 12.0–46.0)
Lymphs Abs: 3.3 10*3/uL (ref 0.7–4.0)
MCHC: 33.4 g/dL (ref 30.0–36.0)
MCV: 89.4 fl (ref 78.0–100.0)
Monocytes Absolute: 0.7 10*3/uL (ref 0.1–1.0)
Monocytes Relative: 7.2 % (ref 3.0–12.0)
Neutro Abs: 5 10*3/uL (ref 1.4–7.7)
Neutrophils Relative %: 54.7 % (ref 43.0–77.0)
Platelets: 380 10*3/uL (ref 150.0–400.0)
RBC: 4.55 Mil/uL (ref 3.87–5.11)
RDW: 14.9 % (ref 11.5–15.5)
WBC: 9.2 10*3/uL (ref 4.0–10.5)

## 2022-05-07 LAB — BASIC METABOLIC PANEL
BUN: 13 mg/dL (ref 6–23)
CO2: 27 mEq/L (ref 19–32)
Calcium: 9.7 mg/dL (ref 8.4–10.5)
Chloride: 105 mEq/L (ref 96–112)
Creatinine, Ser: 0.67 mg/dL (ref 0.40–1.20)
GFR: 95.78 mL/min (ref >60.00)
Glucose, Bld: 81 mg/dL (ref 70–99)
Potassium: 3.7 mEq/L (ref 3.5–5.1)
Sodium: 142 mEq/L (ref 135–145)

## 2022-05-07 LAB — LIPID PANEL
Cholesterol: 163 mg/dL (ref 0–200)
HDL: 51.2 mg/dL (ref >39.00)
LDL Cholesterol: 95 mg/dL (ref 0–99)
NonHDL: 111.34
Total CHOL/HDL Ratio: 3
Triglycerides: 81 mg/dL (ref 0.0–149.0)
VLDL: 16.2 mg/dL (ref 0.0–40.0)

## 2022-05-07 LAB — HEPATIC FUNCTION PANEL
ALT: 18 U/L (ref 0–35)
AST: 17 U/L (ref 0–37)
Albumin: 4.6 g/dL (ref 3.5–5.2)
Alkaline Phosphatase: 116 U/L (ref 39–117)
Bilirubin, Direct: 0.1 mg/dL (ref 0.0–0.3)
Total Bilirubin: 0.5 mg/dL (ref 0.2–1.2)
Total Protein: 6.9 g/dL (ref 6.0–8.3)

## 2022-05-07 LAB — TSH: TSH: 1.64 u[IU]/mL (ref 0.35–5.50)

## 2022-05-07 MED ORDER — METFORMIN HCL 500 MG PO TABS
500.0000 mg | ORAL_TABLET | Freq: Two times a day (BID) | ORAL | 3 refills | Status: DC
Start: 2022-05-07 — End: 2023-05-09

## 2022-05-07 MED ORDER — LISINOPRIL 20 MG PO TABS: 20.0000 mg | ORAL_TABLET | Freq: Two times a day (BID) | ORAL | 3 refills | Status: DC

## 2022-05-07 MED ORDER — ATORVASTATIN CALCIUM 20 MG PO TABS: 20.0000 mg | ORAL_TABLET | Freq: Every day | ORAL | 3 refills | Status: DC

## 2022-05-07 MED ORDER — AMLODIPINE BESYLATE 10 MG PO TABS: 10.0000 mg | ORAL_TABLET | Freq: Every day | ORAL | 3 refills | Status: DC

## 2022-05-07 MED ORDER — POTASSIUM CHLORIDE CRYS ER 10 MEQ PO TBCR: 20.0000 meq | EXTENDED_RELEASE_TABLET | Freq: Every day | ORAL | 3 refills | Status: DC

## 2022-05-07 NOTE — Progress Notes (Signed)
Subjective:    Patient ID: [name], female    DOB: July 06, 1961, 61 y.o.   MRN: [id number]  HPI Here for a well exam. She feels fine. Her am fasting glucoses average 130, and her random evening glucoses can go up to 160.    Review of Systems  Constitutional: Negative.   HENT: Negative.    Eyes: Negative.   Respiratory: Negative.    Cardiovascular: Negative.   Gastrointestinal: Negative.   Genitourinary:  Negative for decreased urine volume, difficulty urinating, dyspareunia, dysuria, enuresis, flank pain, frequency, hematuria, pelvic pain and urgency.  Musculoskeletal: Negative.   Skin: Negative.   Neurological: Negative.  Negative for headaches.  Psychiatric/Behavioral: Negative.        Objective:   Physical Exam Constitutional:      General: She is not in acute distress.    Appearance: Normal appearance. She is well-developed.  HENT:     Head: Normocephalic and atraumatic.     Right Ear: External ear normal.     Left Ear: External ear normal.     Nose: Nose normal.     Mouth/Throat:     Pharynx: No oropharyngeal exudate.  Eyes:     General: No scleral icterus.    Conjunctiva/sclera: Conjunctivae normal.     Pupils: Pupils are equal, round, and reactive to light.  Neck:     Thyroid: No thyromegaly.     Vascular: No JVD.  Cardiovascular:     Rate and Rhythm: Normal rate and regular rhythm.     Heart sounds: Normal heart sounds. No murmur heard.   No friction rub. No gallop.  Pulmonary:     Effort: Pulmonary effort is normal. No respiratory distress.     Breath sounds: Normal breath sounds. No wheezing or rales.  Chest:     Chest wall: No tenderness.  Abdominal:     General: Bowel sounds are normal. There is no distension.     Palpations: Abdomen is soft. There is no mass.     Tenderness: There is no abdominal tenderness. There is no guarding or rebound.  Musculoskeletal:        General: No tenderness. Normal range of motion.     Cervical back: Normal  range of motion and neck supple.  Lymphadenopathy:     Cervical: No cervical adenopathy.  Skin:    General: Skin is warm and dry.     Findings: No erythema or rash.  Neurological:     Mental Status: She is alert and oriented to person, place, and time.     Cranial Nerves: No cranial nerve deficit.     Motor: No abnormal muscle tone.     Coordination: Coordination normal.     Deep Tendon Reflexes: Reflexes are normal and symmetric. Reflexes normal.  Psychiatric:        Behavior: Behavior normal.        Thought Content: Thought content normal.        Judgment: Judgment normal.          Assessment & Plan:  Well exam. We dicussed diet and exercise. Get fasting labs including an A1c.  Gershon Crane, MD

## 2022-05-08 LAB — HEMOGLOBIN A1C: Hgb A1c MFr Bld: 6.3 % (ref 4.6–6.5)

## 2022-06-20 ENCOUNTER — Ambulatory Visit: Payer: BC Managed Care – PPO | Admitting: Family Medicine

## 2022-06-20 ENCOUNTER — Encounter: Payer: Self-pay | Admitting: Family Medicine

## 2022-06-20 VITALS — BP 118/72 | HR 72 | Temp 98.5°F | Resp 12 | Ht 63.25 in | Wt 171.2 lb

## 2022-06-20 DIAGNOSIS — L03011 Cellulitis of right finger: Secondary | ICD-10-CM | POA: Diagnosis not present

## 2022-06-20 DIAGNOSIS — S61219A Laceration without foreign body of unspecified finger without damage to nail, initial encounter: Secondary | ICD-10-CM

## 2022-06-20 DIAGNOSIS — R011 Cardiac murmur, unspecified: Secondary | ICD-10-CM

## 2022-06-20 MED ORDER — SULFAMETHOXAZOLE-TRIMETHOPRIM 800-160 MG PO TABS: 1.0000 | ORAL_TABLET | Freq: Two times a day (BID) | ORAL | 0 refills | Status: AC
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
## 2022-06-20 NOTE — Progress Notes (Signed)
ACUTE VISIT Chief Complaint  Patient presents with   Hand Injury    Cut her finger on a knife when she was doing dishes, never bled. It does have some numbness and starting to turn colors, throbbing has eased of some. Happened last Thursday.   HPI: [name] is a 61 y.o. right handed female with history of OSA, asthma, DM 2, and OA here today complaining of edema and pain around right index finger after sticking tip of knife in between nail and cuticle about 6 days ago as described above. For the past few days she has noted greenish coloration and edema. Pain is constant, 8/10, radiated to proximal phalange and sometimes radial aspect of forearm. She has not noted significant limitation of IP ROM. Problem is gradually getting worse.  Had chills yesterday and felt febrile.  Ibuprofen has helped, now pain is 4/10. Tdap up to date.  Noted today heart murmur on auscultation, she reports occasional "little" CP's since 12/2021. Not radiated. It is not with exertion and no associated SOB,palpitations,or diaphoresis.  Review of Systems  Constitutional:  Negative for activity change, appetite change and fatigue.  HENT:  Negative for mouth sores and sore throat.   Respiratory:  Negative for cough and wheezing.   Gastrointestinal:  Negative for abdominal pain, nausea and vomiting.  Neurological:  Negative for syncope and weakness.  Hematological:  Negative for adenopathy. Does not bruise/bleed easily.  Rest see pertinent positives and negatives per HPI.  Current Outpatient Medications on File Prior to Visit  Medication Sig Dispense Refill   albuterol (VENTOLIN HFA) 108 (90 Base) MCG/ACT inhaler Inhale 2 puffs into the lungs every 4 (four) hours as needed for wheezing or shortness of breath. 18 g 2   amLODipine (NORVASC) 10 MG tablet Take 1 tablet (10 mg total) by mouth daily. 90 tablet 3   atorvastatin (LIPITOR) 20 MG tablet Take 1 tablet (20 mg total) by mouth daily. 90 tablet  3   blood glucose meter kit and supplies Dispense based on patient and insurance preference. Test once a day. E11.9 1 each 0   cholecalciferol (VITAMIN D) 1000 UNITS tablet Take 1,000 Units by mouth daily.      linaclotide (LINZESS) 145 MCG CAPS capsule Take 1 capsule (145 mcg total) by mouth daily before breakfast. 30 capsule 3   lisinopril (ZESTRIL) 20 MG tablet Take 1 tablet (20 mg total) by mouth 2 (two) times daily. 180 tablet 3   metFORMIN (GLUCOPHAGE) 500 MG tablet Take 1 tablet (500 mg total) by mouth 2 (two) times daily with a meal. 180 tablet 3   MIRALAX 17 GM/SCOOP powder 1 dose in 8 ounces of water 1 to 2 times each day 255 g 0   Multiple Vitamins-Minerals (MULTIVITAMIN WITH MINERALS) tablet Take 1 tablet by mouth daily.     ondansetron (ZOFRAN) 8 MG tablet TAKE ONE TABLET BY MOUTH EVERY 6 HOURS AS NEEDED FOR NAUSEA/VOMITING 60 tablet 3   phentermine 37.5 MG capsule Take 1 capsule (37.5 mg total) by mouth every morning. 90 capsule 1   potassium chloride (KLOR-CON M) 10 MEQ tablet Take 2 tablets (20 mEq total) by mouth daily. 180 tablet 3   valACYclovir (VALTREX) 500 MG tablet TAKE ONE TABLET BY MOUTH TWICE A DAY AS NEEDED FOR OUTBREAKS 30 tablet 4   No current facility-administered medications on file prior to visit.   Past Medical History:  Diagnosis Date   Allergy    Dr Caprice Red  Cardiac arrhythmia    Depression    Diabetes mellitus without complication (HCC)    Fibroids    uterine sees Dr. Carlota Raspberry in [place]    Genital warts    Heart murmur    High cholesterol    Hypertension    Irritable bowel syndrome (IBS)    Low back pain    has a herniated lumbar disc sees Dr Marlou Sa   Allergies  Allergen Reactions   Iodinated Contrast Media Swelling    Tongue swelling   Iodine Swelling    Tongue swelling   Peanut-Containing Drug Products Anaphylaxis   Hctz [Hydrochlorothiazide] Other (See Comments)    Blood pressure goes up   Shellfish-Derived Products Other (See  Comments)     Per allergy test   Cephalexin Rash   Ciprofloxacin Diarrhea   Social History   Socioeconomic History   Marital status: Married    Spouse name: Not on file   Number of children: 4   Years of education: Not on file   Highest education level: Some college, no degree  Occupational History   Occupation: Pharmacist, hospital  Tobacco Use   Smoking status: Former    Packs/day: 0.50    Years: 1.00    Total pack years: 0.50    Types: Cigarettes    Quit date: 12/17/1996    Years since quitting: 25.5   Smokeless tobacco: Never  Vaping Use   Vaping Use: Never used  Substance and Sexual Activity   Alcohol use: Yes    Alcohol/week: 0.0 standard drinks of alcohol    Comment: occ   Drug use: No   Sexual activity: Yes    Birth control/protection: Surgical  Other Topics Concern   Not on file  Social History Narrative   Not on file   Social Determinants of Health   Financial Resource Strain: Low Risk  (08/08/2021)   Overall Financial Resource Strain (CARDIA)    Difficulty of Paying Living Expenses: Not hard at all  Food Insecurity: No Food Insecurity (08/08/2021)   Hunger Vital Sign    Worried About Running Out of Food in the Last Year: Never true    [place] in the Last Year: Never true  Transportation Needs: No Transportation Needs (08/08/2021)   PRAPARE - Hydrologist (Medical): No    Lack of Transportation (Non-Medical): No  Physical Activity: Sufficiently Active (08/08/2021)   Exercise Vital Sign    Days of Exercise per Week: 6 days    Minutes of Exercise per Session: 30 min  Stress: Stress Concern Present (08/08/2021)   [place]    Feeling of Stress : To some extent  Social Connections: Socially Integrated (08/08/2021)   Social Connection and Isolation Panel [NHANES]    Frequency of Communication with Friends and Family: More than three times a week    Frequency of Social  Gatherings with Friends and Family: Once a week    Attends Religious Services: More than 4 times per year    Active Member of [employer] or Organizations: Yes    Attends Archivist Meetings: More than 4 times per year    Marital Status: Married   Vitals:   06/20/22 1151  BP: 118/72  Pulse: 72  Resp: 12  Temp: 98.5 F (36.9 C)  SpO2: 97%   Body mass index is 30.1 kg/m.  Physical Exam Vitals and nursing note reviewed.  Constitutional:  General: She is not in acute distress.    Appearance: She is well-developed.  HENT:     Head: Normocephalic and atraumatic.  Eyes:     Conjunctiva/sclera: Conjunctivae normal.  Cardiovascular:     Rate and Rhythm: Normal rate and regular rhythm.     Pulses:          Radial pulses are 2+ on the right side.     Heart sounds: Murmur (SEM I/VI LUSB and RUSB) heard.  Pulmonary:     Effort: Pulmonary effort is normal. No respiratory distress.     Breath sounds: Normal breath sounds.  Musculoskeletal:     Right hand: Swelling and tenderness present. No deformity or lacerations. Normal sensation. Normal capillary refill. Normal pulse.     Comments: DIP right index mildly limited flexion, elicits pain. Rest IP and MCP joints normal ROM.  Skin:    General: Skin is warm.     Findings: No rash.     Comments: Radial aspect of right index with periungual edema, mild erythema, and tenderness with palpation. No fluctuant area appreciated and no drainage.  Neurological:     Mental Status: She is alert and oriented to person, place, and time.  Psychiatric:        Mood and Affect: Mood is anxious.     Comments: Well groomed, good eye contact.   ASSESSMENT AND PLAN:  [name] was seen today for hand injury.  Diagnoses and all orders for this visit:  Stab wound of finger, initial encounter Vaccination up-to-date. Recommend keeping area clean with soap and water. Instructed about warning signs. Follow-up in 2 weeks, before if  needed.  Paronychia of finger of right hand Mild. Bactrim DS twice daily for 7 days recommended. Instructed to soak index finger in warm water with Epsom salt 1 or twice per day.  -     sulfamethoxazole-trimethoprim (BACTRIM DS) 800-160 MG tablet; Take 1 tablet by mouth 2 (two) times daily for 7 days.  Heart murmur Very mild, incidental findings. Initially she seems concerned then she mentions that she may have been told about a heart murmur before. Because she mentions "little" CP, recommend following with PCP. She has had EKG's done in the ED for chest discomfort, no new symptoms. Has seen cardiologist and had a nuclear stress test in 02/2016: LV wall motion within normal limits, LVEF 56%, and negative for focal perfusion defects. Instructed about warning signs.  I spent a total of 35 minutes in both face to face and non face to face activities for this visit on the date of this encounter. During this time history was obtained and documented, examination was performed, prior labs/imaging reviewed, and assessment/plan discussed.  Return in about 2 weeks (around 07/04/2022) for f/u and to discuss heart murmur with PCP.  Aryanna Shaver G. [place], MD  [facility]. [place] office.

## 2022-07-02 ENCOUNTER — Ambulatory Visit (INDEPENDENT_AMBULATORY_CARE_PROVIDER_SITE_OTHER): Payer: BC Managed Care – PPO | Admitting: Family Medicine

## 2022-07-02 ENCOUNTER — Encounter: Payer: Self-pay | Admitting: Family Medicine

## 2022-07-02 VITALS — BP 110/78 | HR 89 | Temp 98.0°F | Wt 167.0 lb

## 2022-07-02 DIAGNOSIS — R011 Cardiac murmur, unspecified: Secondary | ICD-10-CM | POA: Diagnosis not present

## 2022-07-02 DIAGNOSIS — S61230D Puncture wound without foreign body of right index finger without damage to nail, subsequent encounter: Secondary | ICD-10-CM

## 2022-07-02 DIAGNOSIS — I10 Essential (primary) hypertension: Secondary | ICD-10-CM

## 2022-07-02 DIAGNOSIS — S61230A Puncture wound without foreign body of right index finger without damage to nail, initial encounter: Secondary | ICD-10-CM

## 2022-07-02 NOTE — Progress Notes (Signed)
Subjective:    Patient ID: [name], female    DOB: 1961/01/20, 61 y.o.   MRN: [id number]  HPI Here to follow up a visit here on 06-20-22 when she saw Dr. [place] for an injury to the right index finger. She had poked the finger with the tip of a knife. The skin was not broken but the area was inflamed and painful. She was given 7 days of Bactrim DS, and now it looks and feels much better. It is only mildly sensitive to touch now. Also she was found to have a murmur that day. We had detected a murmur in her some years ago, but then it disappeared. She has never had an ECHO. No chest pain or SOB. Finally she thinks her BP is too low. She has been working hard on diet and exercise. Her resting BP at home is 100-110/70s, but it has dropped as low as 88 systolic.    Review of Systems  Constitutional: Negative.   Respiratory: Negative.    Cardiovascular: Negative.   Skin:  Positive for wound.       Objective:   Physical Exam Constitutional:      Appearance: Normal appearance.  Cardiovascular:     Rate and Rhythm: Normal rate and regular rhythm.     Pulses: Normal pulses.     Heart sounds: Normal heart sounds. No murmur heard. Pulmonary:     Effort: Pulmonary effort is normal.     Breath sounds: Normal breath sounds.  Skin:    Comments: The skin beside the nail of the right index finger has peeled back but there is no erythema. Only slight tenderness   Neurological:     Mental Status: She is alert.           Assessment & Plan:  The finger wound is healing nicely. She can follow up this as needed. She does not have a murmur today. We will continue to monitor this. Her HTN is overly controlled, so she will decrease the Amlodipine to 1/2 a pill (5 mg) daily. Recheck BP and an A1c in 4 weeks.  Gershon Crane, MD

## 2022-08-01 ENCOUNTER — Ambulatory Visit: Payer: BC Managed Care – PPO | Admitting: Family Medicine

## 2022-08-16 ENCOUNTER — Other Ambulatory Visit: Payer: Self-pay | Admitting: Family Medicine

## 2022-08-16 NOTE — Telephone Encounter (Signed)
Pt has appt scheduled on 08/22/22.*  Last refill- 02/12/22--90 cap, 1 refill Last OV- 07/02/22

## 2022-08-22 ENCOUNTER — Encounter: Payer: Self-pay | Admitting: Family Medicine

## 2022-08-22 ENCOUNTER — Telehealth (INDEPENDENT_AMBULATORY_CARE_PROVIDER_SITE_OTHER): Payer: BC Managed Care – PPO | Admitting: Family Medicine

## 2022-08-22 DIAGNOSIS — R011 Cardiac murmur, unspecified: Secondary | ICD-10-CM

## 2022-08-22 DIAGNOSIS — I10 Essential (primary) hypertension: Secondary | ICD-10-CM | POA: Diagnosis not present

## 2022-08-22 MED ORDER — PHENTERMINE HCL 37.5 MG PO CAPS: 37.5000 mg | ORAL_CAPSULE | ORAL | 1 refills | Status: DC

## 2022-08-22 MED ORDER — AMLODIPINE BESYLATE 5 MG PO TABS: 5.0000 mg | ORAL_TABLET | Freq: Every day | ORAL | 3 refills | Status: DC

## 2022-08-22 NOTE — Progress Notes (Signed)
Subjective:    Patient ID: [name], female    DOB: 11-03-1961, 61 y.o.   MRN: [id number]  HPI Virtual Visit via Telephone Note  I connected with the patient on 08/22/22 at  4:00 PM EDT by telephone and verified that I am speaking with the correct person using two identifiers.   I discussed the limitations, risks, security and privacy concerns of performing an evaluation and management service by telephone and the availability of in person appointments. I also discussed with the patient that there may be a patient responsible charge related to this service. The patient expressed understanding and agreed to proceed.  Location patient: home Location provider: work or home office Participants present for the call: patient, provider Patient did not have a visit in the prior 7 days to address this/these issue(s).   History of Present Illness: Here to follow up on BP and to discuss some recent heart flutters. A tour last visit on 07-02-22 her BP had been running low so we had reduced her Amlodipine to 5 mg daily. This has worked well, and her BP is now averaging in the 120s to 130s over 80s. We also discussed her heart murmur that seems to come and go. We did not hear a murmur that day. However a few evenings ago she was lying on her cough and she felt a few flutters in her chest. No chest pain or SOB. This lasted only a few seconds and then it stopped. She is interested in having another ECHO to check this out. Her last one was in 2011.    Observations/Objective: Patient sounds cheerful and well on the phone. I do not appreciate any SOB. Speech and thought processing are grossly intact. Patient reported vitals:  Assessment and Plan: Her HTN is now well controlled so we will stay on the 5 mg dose of Amlodipine. For the hx of a murmur and her recent palpitations, we will set up another ECHO. Gershon Crane, MD   Follow Up Instructions:     [phone] 5-10 [phone] 11-20 [id number] 21-30 I did  not refer this patient for an OV in the next 24 hours for this/these issue(s).  I discussed the assessment and treatment plan with the patient. The patient was provided an opportunity to ask questions and all were answered. The patient agreed with the plan and demonstrated an understanding of the instructions.   The patient was advised to call back or seek an in-person evaluation if the symptoms worsen or if the condition fails to improve as anticipated.  I provided 17 minutes of non-face-to-face time during this encounter.   Gershon Crane, MD     Review of Systems     Objective:   Physical Exam        Assessment & Plan:

## 2022-09-06 ENCOUNTER — Encounter: Payer: Self-pay | Admitting: Adult Health

## 2022-09-06 ENCOUNTER — Telehealth (INDEPENDENT_AMBULATORY_CARE_PROVIDER_SITE_OTHER): Payer: BC Managed Care – PPO | Admitting: Adult Health

## 2022-09-06 VITALS — HR 90 | Temp 97.6°F | Ht 60.25 in | Wt 159.0 lb

## 2022-09-06 DIAGNOSIS — R519 Headache, unspecified: Secondary | ICD-10-CM

## 2022-09-06 DIAGNOSIS — M791 Myalgia, unspecified site: Secondary | ICD-10-CM

## 2022-09-06 DIAGNOSIS — J029 Acute pharyngitis, unspecified: Secondary | ICD-10-CM

## 2022-09-06 DIAGNOSIS — U071 COVID-19: Secondary | ICD-10-CM

## 2022-09-06 LAB — POC COVID19 BINAXNOW: SARS Coronavirus 2 Ag: POSITIVE — AB

## 2022-09-06 LAB — POCT RAPID STREP A (OFFICE): Rapid Strep A Screen: NEGATIVE

## 2022-09-06 LAB — POCT INFLUENZA A/B
Influenza A, POC: NEGATIVE
Influenza B, POC: NEGATIVE

## 2022-09-06 MED ORDER — MOLNUPIRAVIR EUA 200MG CAPSULE: 4.0000 | ORAL_CAPSULE | Freq: Two times a day (BID) | ORAL | 0 refills | Status: AC

## 2022-09-06 NOTE — Progress Notes (Signed)
Virtual Visit via Video Note  I connected with [name] on 09/06/22 at  2:00 PM EDT by a video enabled telemedicine application and verified that I am speaking with the correct person using two identifiers.  Location patient: home Location provider:work or home office Persons participating in the virtual visit: patient, provider  I discussed the limitations of evaluation and management by telemedicine and the availability of in person appointments. The patient expressed understanding and agreed to proceed.   HPI: She presents to the office today for an acute issue. Her symptoms started two days ago. Symptoms include sinus pain and pressure/headache, cough, shortness of breath, mild nausea, chills, and minor throat irritation. She tested positive for Covid 19 this afternoon.   She denies fevers, vomiting, and diarrhea.   She reports having Covid four times over the last few years, she has never been on antiviral therapy but is interested in it.    ROS: See pertinent positives and negatives per HPI.  Past Medical History:  Diagnosis Date   Allergy    Dr Caprice Red   Cardiac arrhythmia    Depression    Diabetes mellitus without complication [facility])    Fibroids    uterine sees Dr. Carlota Raspberry in [place]    Genital warts    Heart murmur    High cholesterol    Hypertension    Irritable bowel syndrome (IBS)    Low back pain    has a herniated lumbar disc sees Dr Marlou Sa    Past Surgical History:  Procedure Laterality Date   ABDOMINOPLASTY  04/2013   BREAST BIOPSY Right 06/2014   COLONOSCOPY  02/10/2015   per Dr. Hilarie Fredrickson, clear, repeat in 10 yrs    LUMBAR LAMINECTOMY  2010   L3-4 on 02-24-10 per Dr Louanne Skye    MYOMECTOMY     laser ablation of uterine fibroids per Dr Ouida Sills 2000   TUBAL LIGATION      Family History  Problem Relation Age of Onset   Diabetes Mother    Hypertension Mother    Diabetes Father    Hypertension Father    Heart disease Father    Breast cancer  Sister    Diabetes Maternal Grandmother    Hypertension Maternal Grandmother    Diabetes Maternal Grandfather    Hypertension Maternal Grandfather    Diabetes Paternal Grandmother    Hypertension Paternal Grandmother    Diabetes Paternal Grandfather    Hypertension Paternal Grandfather    Arthritis Other        Family Hx   Diabetes Other        Family Hx 1st degree relative   Hyperlipidemia Other        Family Hx   Hypertension Other        Family Hx   Prostate cancer Other        Family Hx 1st Degree relative <50   Stroke Other        First Degree Female <50   Colon cancer Neg Hx    Esophageal cancer Neg Hx    Pancreatic cancer Neg Hx    Stomach cancer Neg Hx    Liver disease Neg Hx        Current Outpatient Medications:    albuterol (VENTOLIN HFA) 108 (90 Base) MCG/ACT inhaler, Inhale 2 puffs into the lungs every 4 (four) hours as needed for wheezing or shortness of breath., Disp: 18 g, Rfl: 2   amLODipine (NORVASC) 5 MG tablet, Take 1 tablet (5 mg  total) by mouth daily., Disp: 90 tablet, Rfl: 3   atorvastatin (LIPITOR) 20 MG tablet, Take 1 tablet (20 mg total) by mouth daily., Disp: 90 tablet, Rfl: 3   blood glucose meter kit and supplies, Dispense based on patient and insurance preference. Test once a day. E11.9, Disp: 1 each, Rfl: 0   cholecalciferol (VITAMIN D) 1000 UNITS tablet, Take 1,000 Units by mouth daily. , Disp: , Rfl:    linaclotide (LINZESS) 145 MCG CAPS capsule, Take 1 capsule (145 mcg total) by mouth daily before breakfast., Disp: 30 capsule, Rfl: 3   lisinopril (ZESTRIL) 20 MG tablet, Take 1 tablet (20 mg total) by mouth 2 (two) times daily., Disp: 180 tablet, Rfl: 3   metFORMIN (GLUCOPHAGE) 500 MG tablet, Take 1 tablet (500 mg total) by mouth 2 (two) times daily with a meal., Disp: 180 tablet, Rfl: 3   MIRALAX 17 GM/SCOOP powder, 1 dose in 8 ounces of water 1 to 2 times each day, Disp: 255 g, Rfl: 0   Multiple Vitamins-Minerals (MULTIVITAMIN WITH MINERALS)  tablet, Take 1 tablet by mouth daily., Disp: , Rfl:    ondansetron (ZOFRAN) 8 MG tablet, TAKE ONE TABLET BY MOUTH EVERY 6 HOURS AS NEEDED FOR NAUSEA/VOMITING, Disp: 60 tablet, Rfl: 3   phentermine 37.5 MG capsule, Take 1 capsule (37.5 mg total) by mouth every morning., Disp: 90 capsule, Rfl: 1   potassium chloride (KLOR-CON M) 10 MEQ tablet, Take 2 tablets (20 mEq total) by mouth daily., Disp: 180 tablet, Rfl: 3   valACYclovir (VALTREX) 500 MG tablet, TAKE ONE TABLET BY MOUTH TWICE A DAY AS NEEDED FOR OUTBREAKS, Disp: 30 tablet, Rfl: 4  EXAM:  VITALS per patient if applicable:  GENERAL: alert, oriented, appears well and in no acute distress  HEENT: atraumatic, conjunttiva clear, no obvious abnormalities on inspection of external nose and ears  NECK: normal movements of the head and neck  LUNGS: on inspection no signs of respiratory distress, breathing rate appears normal, no obvious gross SOB, gasping or wheezing  CV: no obvious cyanosis  MS: moves all visible extremities without noticeable abnormality  PSYCH/NEURO: pleasant and cooperative, no obvious depression or anxiety, speech and thought processing grossly intact  ASSESSMENT AND PLAN:  Discussed the following assessment and plan:  1. Sorethroat  - POC COVID-19 - Positive - POC Influenza A/B - Negative - POC Rapid Strep A- Negative   2. Muscle pain  - POC COVID-19 - POC Influenza A/B  3. Nonintractable headache, unspecified chronicity pattern, unspecified headache type  - POC COVID-19 - POC Influenza A/B  4. COVID-19 virus infection - Will prescribe Legevrio due to medical history. Side effects reviewed.  - Advised of red flags and follow up instructions  - molnupiravir EUA (LAGEVRIO) 200 mg CAPS capsule; Take 4 capsules (800 mg total) by mouth 2 (two) times daily for 5 days.  Dispense: 40 capsule; Refill: 0      I discussed the assessment and treatment plan with the patient. The patient was provided an  opportunity to ask questions and all were answered. The patient agreed with the plan and demonstrated an understanding of the instructions.   The patient was advised to call back or seek an in-person evaluation if the symptoms worsen or if the condition fails to improve as anticipated.   Dorothyann Peng, NP

## 2022-09-21 ENCOUNTER — Ambulatory Visit (HOSPITAL_COMMUNITY): Payer: BC Managed Care – PPO | Attending: Cardiology

## 2022-09-21 DIAGNOSIS — R011 Cardiac murmur, unspecified: Secondary | ICD-10-CM | POA: Diagnosis present

## 2022-09-25 LAB — ECHOCARDIOGRAM COMPLETE
Area-P 1/2: 3.68 cm2
S' Lateral: 2.6 cm

## 2022-10-08 ENCOUNTER — Encounter: Payer: Self-pay | Admitting: Family Medicine

## 2022-10-09 ENCOUNTER — Encounter: Payer: Self-pay | Admitting: Family Medicine

## 2022-10-09 ENCOUNTER — Ambulatory Visit (INDEPENDENT_AMBULATORY_CARE_PROVIDER_SITE_OTHER): Payer: BC Managed Care – PPO | Admitting: Family Medicine

## 2022-10-09 ENCOUNTER — Ambulatory Visit: Payer: Self-pay

## 2022-10-09 VITALS — BP 130/92 | HR 95 | Ht 60.0 in | Wt 157.0 lb

## 2022-10-09 DIAGNOSIS — M722 Plantar fascial fibromatosis: Secondary | ICD-10-CM | POA: Diagnosis not present

## 2022-10-09 DIAGNOSIS — G8929 Other chronic pain: Secondary | ICD-10-CM | POA: Diagnosis not present

## 2022-10-09 DIAGNOSIS — M79671 Pain in right foot: Secondary | ICD-10-CM | POA: Diagnosis not present

## 2022-10-09 NOTE — Patient Instructions (Signed)
[employer] Total Support Orthotics Original OOFOS or HOKA sandals in the house See me again I 8 weeks

## 2022-10-09 NOTE — Assessment & Plan Note (Signed)
Injection given today.  Chronic problem with worsening symptoms.  Failed all other conservative therapy.  The patient has been attempting to lose weight and encouraged her to continue to be active.  Discussed over-the-counter orthotics and proper shoes that I think will be beneficial.  Discussed posture and ergonomics also that can be helpful.  Patient given injection today and hopefully will do well and follow-up with me again in 8 weeks

## 2022-10-09 NOTE — Progress Notes (Signed)
[place] [facility] [place] Phone: [phone] Subjective:   [name], am serving as a scribe for Dr. Hulan Saas.  I'm seeing this patient by the request  of:  Laurey Morale, MD  CC: Right heel pain  [id number]  [name] is a 61 y.o. female coming in with complaint of R heel pain. Had injection for plantar fasciitis in March 2023. Patient states that pain has come back in R heel with weight bearing. Has not been treating pain.  Patient has not been doing any exercises per side.  Continues to have discomfort and pain fairly regularly. Can affect daily activities.  Patient will be traveling and feels like she would not be able to walk as she is at the moment.      Past Medical History:  Diagnosis Date   Allergy    Dr Caprice Red   Cardiac arrhythmia    Depression    Diabetes mellitus without complication [facility])    Fibroids    uterine sees Dr. Carlota Raspberry in [place]    Genital warts    Heart murmur    High cholesterol    Hypertension    Irritable bowel syndrome (IBS)    Low back pain    has a herniated lumbar disc sees Dr Marlou Sa   Past Surgical History:  Procedure Laterality Date   ABDOMINOPLASTY  04/2013   BREAST BIOPSY Right 06/2014   COLONOSCOPY  02/10/2015   per Dr. Hilarie Fredrickson, clear, repeat in 10 yrs    LUMBAR LAMINECTOMY  2010   L3-4 on 02-24-10 per Dr Louanne Skye    MYOMECTOMY     laser ablation of uterine fibroids per Dr Ouida Sills 2000   TUBAL LIGATION     Social History   Socioeconomic History   Marital status: Married    Spouse name: Not on file   Number of children: 4   Years of education: Not on file   Highest education level: Some college, no degree  Occupational History   Occupation: Pharmacist, hospital  Tobacco Use   Smoking status: Former    Packs/day: 0.50    Years: 1.00    Total pack years: 0.50    Types: Cigarettes    Quit date: 12/17/1996    Years since quitting: 25.8   Smokeless tobacco:  Never  Vaping Use   Vaping Use: Never used  Substance and Sexual Activity   Alcohol use: Yes    Alcohol/week: 0.0 standard drinks of alcohol    Comment: occ   Drug use: No   Sexual activity: Yes    Birth control/protection: Surgical  Other Topics Concern   Not on file  Social History Narrative   Not on file   Social Determinants of Health   Financial Resource Strain: Low Risk  (08/08/2021)   Overall Financial Resource Strain (CARDIA)    Difficulty of Paying Living Expenses: Not hard at all  Food Insecurity: No Food Insecurity (08/08/2021)   Hunger Vital Sign    Worried About Running Out of Food in the Last Year: Never true    [place] in the Last Year: Never true  Transportation Needs: No Transportation Needs (08/08/2021)   PRAPARE - Hydrologist (Medical): No    Lack of Transportation (Non-Medical): No  Physical Activity: Sufficiently Active (08/08/2021)   Exercise Vital Sign    Days of Exercise per Week: 6 days    Minutes of Exercise  per Session: 30 min  Stress: Stress Concern Present (08/08/2021)   [facility]    Feeling of Stress : To some extent  Social Connections: Socially Integrated (08/08/2021)   Social Connection and Isolation Panel [NHANES]    Frequency of Communication with Friends and Family: More than three times a week    Frequency of Social Gatherings with Friends and Family: Once a week    Attends Religious Services: More than 4 times per year    Active Member of [employer] or Organizations: Yes    Attends Music therapist: More than 4 times per year    Marital Status: Married   Allergies  Allergen Reactions   Iodinated Contrast Media Swelling    Tongue swelling   Iodine Swelling    Tongue swelling   Peanut-Containing Drug Products Anaphylaxis   Hctz [Hydrochlorothiazide] Other (See Comments)    Blood pressure goes up   [employer]  Other (See Comments)     Per allergy test   Cephalexin Rash   Ciprofloxacin Diarrhea   Family History  Problem Relation Age of Onset   Diabetes Mother    Hypertension Mother    Diabetes Father    Hypertension Father    Heart disease Father    Breast cancer Sister    Diabetes Maternal Grandmother    Hypertension Maternal Grandmother    Diabetes Maternal Grandfather    Hypertension Maternal Grandfather    Diabetes Paternal Grandmother    Hypertension Paternal Grandmother    Diabetes Paternal Grandfather    Hypertension Paternal Grandfather    Arthritis Other        Family Hx   Diabetes Other        Family Hx 1st degree relative   Hyperlipidemia Other        Family Hx   Hypertension Other        Family Hx   Prostate cancer Other        Family Hx 1st Degree relative <50   Stroke Other        First Degree Female <50   Colon cancer Neg Hx    Esophageal cancer Neg Hx    Pancreatic cancer Neg Hx    Stomach cancer Neg Hx    Liver disease Neg Hx     Current Outpatient Medications (Endocrine & Metabolic):    metFORMIN (GLUCOPHAGE) 500 MG tablet, Take 1 tablet (500 mg total) by mouth 2 (two) times daily with a meal.  Current Outpatient Medications (Cardiovascular):    amLODipine (NORVASC) 5 MG tablet, Take 1 tablet (5 mg total) by mouth daily.   atorvastatin (LIPITOR) 20 MG tablet, Take 1 tablet (20 mg total) by mouth daily.   lisinopril (ZESTRIL) 20 MG tablet, Take 1 tablet (20 mg total) by mouth 2 (two) times daily.  Current Outpatient Medications (Respiratory):    albuterol (VENTOLIN HFA) 108 (90 Base) MCG/ACT inhaler, Inhale 2 puffs into the lungs every 4 (four) hours as needed for wheezing or shortness of breath.    Current Outpatient Medications (Other):    blood glucose meter kit and supplies, Dispense based on patient and insurance preference. Test once a day. E11.9   cholecalciferol (VITAMIN D) 1000 UNITS tablet, Take 1,000 Units by mouth daily.    linaclotide  (LINZESS) 145 MCG CAPS capsule, Take 1 capsule (145 mcg total) by mouth daily before breakfast.   MIRALAX 17 GM/SCOOP powder, 1 dose in 8 ounces of water 1 to  2 times each day   Multiple Vitamins-Minerals (MULTIVITAMIN WITH MINERALS) tablet, Take 1 tablet by mouth daily.   ondansetron (ZOFRAN) 8 MG tablet, TAKE ONE TABLET BY MOUTH EVERY 6 HOURS AS NEEDED FOR NAUSEA/VOMITING   phentermine 37.5 MG capsule, Take 1 capsule (37.5 mg total) by mouth every morning.   potassium chloride (KLOR-CON M) 10 MEQ tablet, Take 2 tablets (20 mEq total) by mouth daily.   valACYclovir (VALTREX) 500 MG tablet, TAKE ONE TABLET BY MOUTH TWICE A DAY AS NEEDED FOR OUTBREAKS   Reviewed prior external information including notes and imaging from  primary care provider As well as notes that were available from care everywhere and other healthcare systems.  Past medical history, social, surgical and family history all reviewed in electronic medical record.  No pertanent information unless stated regarding to the chief complaint.   Review of Systems:  No headache, visual changes, nausea, vomiting, diarrhea, constipation, dizziness, abdominal pain, skin rash, fevers, chills, night sweats, weight loss, swollen lymph nodes, body aches, joint swelling, chest pain, shortness of breath, mood changes. POSITIVE muscle aches  Objective  Blood pressure (!) 130/92, pulse 95, height 5' (1.524 m), weight 157 lb (71.2 kg), last menstrual period 10/18/2015, SpO2 97 %.   General: No apparent distress alert and oriented x3 mood and affect normal, dressed appropriately.  HEENT: Pupils equal, extraocular movements intact  Respiratory: Patient's speak in full sentences and does not appear short of breath  Cardiovascular: No lower extremity edema, non tender, no erythema  Right foot exam does have the breakdown of the transverse arch noted.  Patient does have tenderness to palpation noted.  Mostly over the medial aspect of the calcaneal  area.  Procedure: Real-time Ultrasound Guided Injection of right plantar fascia Device: GE Logiq Q7 Ultrasound guided injection is preferred based studies that show increased duration, increased effect, greater accuracy, decreased procedural pain, increased response rate, and decreased cost with ultrasound guided versus blind injection.  Verbal informed consent obtained.  Time-out conducted.  Noted no overlying erythema, induration, or other signs of local infection.  Skin prepped in a sterile fashion.  Local anesthesia: Topical Ethyl chloride.  With sterile technique and under real time ultrasound guidance: With a 25-gauge 1-1/2 inch needle patient was injected into the right medial calcaneal area and under ultrasound guidance injected into the plantar fascia at the calcaneal origin with a total of 0.5 cc of 0.5% Marcaine and 0.5 cc of Kenalog 40 mg/mL. Completed without difficulty  Pain immediately improved suggesting accurate placement of the medication.  Advised to call if fevers/chills, erythema, induration, drainage, or persistent bleeding.  Impression: Technically successful ultrasound guided injection.    Impression and Recommendations:    The above documentation has been reviewed and is accurate and complete Lyndal Pulley, DO

## 2022-10-22 ENCOUNTER — Ambulatory Visit: Payer: BC Managed Care – PPO | Admitting: Family Medicine

## 2022-11-23 ENCOUNTER — Encounter: Payer: Self-pay | Admitting: Family Medicine

## 2022-11-23 ENCOUNTER — Ambulatory Visit: Payer: BC Managed Care – PPO | Admitting: Family Medicine

## 2022-11-23 VITALS — BP 160/90 | HR 90 | Temp 98.3°F | Ht 60.0 in

## 2022-11-23 DIAGNOSIS — R6883 Chills (without fever): Secondary | ICD-10-CM | POA: Diagnosis not present

## 2022-11-23 DIAGNOSIS — I10 Essential (primary) hypertension: Secondary | ICD-10-CM | POA: Diagnosis not present

## 2022-11-23 DIAGNOSIS — J029 Acute pharyngitis, unspecified: Secondary | ICD-10-CM | POA: Diagnosis not present

## 2022-11-23 DIAGNOSIS — R519 Headache, unspecified: Secondary | ICD-10-CM

## 2022-11-23 LAB — POCT INFLUENZA A/B
Influenza A, POC: NEGATIVE
Influenza B, POC: NEGATIVE

## 2022-11-23 LAB — POCT RAPID STREP A (OFFICE): Rapid Strep A Screen: POSITIVE — AB

## 2022-11-23 LAB — POC COVID19 BINAXNOW: SARS Coronavirus 2 Ag: NEGATIVE

## 2022-11-23 MED ORDER — AMOXICILLIN 500 MG PO CAPS: 500.0000 mg | ORAL_CAPSULE | Freq: Three times a day (TID) | ORAL | 0 refills | Status: AC

## 2022-11-23 MED ORDER — AMLODIPINE BESYLATE 10 MG PO TABS: 10.0000 mg | ORAL_TABLET | Freq: Every day | ORAL | 3 refills | Status: DC

## 2022-11-23 NOTE — Progress Notes (Signed)
Established Patient Office Visit  Subjective   Patient ID: [name], female    DOB: 1961/02/10  Age: 61 y.o. MRN: [id number]  Chief Complaint  Patient presents with   Chills    X5 days, Tried Mucinex with little relief    Headache    X5 days    Sore Throat    X5 days    Diarrhea    X5 days   Cough    Non productive, x5 days, Tried Mucinex with little relief    HPI   [name] is seen as a work and with about 2-day history of sore throat.  She has had some chills intermittently.  Is also had some occasional loose stools.  Only rare cough.  Denies any skin rashes.  She has history of hypertension.  Currently on amlodipine 5 mg daily and lisinopril 20 mg daily.  She states her amlodipine was reduced from 10 to 5 mg sometime back because of low blood pressures.  Is up quite high today.  Past Medical History:  Diagnosis Date   Allergy    Dr Stevphen [place]   Cardiac arrhythmia    Depression    Diabetes mellitus without complication [facility])    Fibroids    uterine sees Dr. Neva Seat in [place]    Genital warts    Heart murmur    High cholesterol    Hypertension    Irritable bowel syndrome (IBS)    Low back pain    has a herniated lumbar disc sees Dr August Saucer   Past Surgical History:  Procedure Laterality Date   ABDOMINOPLASTY  04/2013   BREAST BIOPSY Right 06/2014   COLONOSCOPY  02/10/2015   per Dr. Rhea Belton, clear, repeat in 10 yrs    LUMBAR LAMINECTOMY  2010   L3-4 on 02-24-10 per Dr Otelia Sergeant    MYOMECTOMY     laser ablation of uterine fibroids per Dr Dareen Piano 2000   TUBAL LIGATION      reports that she quit smoking about 25 years ago. Her smoking use included cigarettes. She has a 0.50 pack-year smoking history. She has never used smokeless tobacco. She reports current alcohol use. She reports that she does not use drugs. family history includes Arthritis in an other family member; Breast cancer in her sister; Diabetes in her father, maternal grandfather, maternal  grandmother, mother, paternal grandfather, paternal grandmother, and another family member; Heart disease in her father; Hyperlipidemia in an other family member; Hypertension in her father, maternal grandfather, maternal grandmother, mother, paternal grandfather, paternal grandmother, and another family member; Prostate cancer in an other family member; Stroke in an other family member. Allergies  Allergen Reactions   Iodinated Contrast Media Swelling    Tongue swelling   Iodine Swelling    Tongue swelling   Peanut-Containing Drug Products Anaphylaxis   Hctz [Hydrochlorothiazide] Other (See Comments)    Blood pressure goes up   Shellfish-Derived Products Other (See Comments)     Per allergy test   Cephalexin Rash   Ciprofloxacin Diarrhea    Review of Systems  Constitutional:  Positive for chills. Negative for weight loss.  HENT:  Positive for sore throat. Negative for sinus pain.       Objective:     BP (!) 160/90 (BP Location: Right Arm, Patient Position: Sitting, Cuff Size: Normal)   Pulse 90   Temp 98.3 F (36.8 C) (Oral)   Ht 5' (1.524 m)   LMP 10/18/2015   SpO2 98%   BMI  30.66 kg/m    Physical Exam Vitals reviewed.  HENT:     Mouth/Throat:     Comments: Only mild posterior pharynx erythema.  No exudate. Cardiovascular:     Rate and Rhythm: Normal rate and regular rhythm.  Pulmonary:     Effort: Pulmonary effort is normal.     Breath sounds: Normal breath sounds. No wheezing or rales.  Musculoskeletal:     Cervical back: Normal range of motion.  Neurological:     Mental Status: She is alert.      No results found for any visits on 11/23/22.    The 10-year ASCVD risk score (Arnett DK, et al., 2019) is: 23.8%    Assessment & Plan:   #1 upper respiratory symptoms with predominantly sore throat and headache.  Positive rapid strep.  She states she has tolerated amoxicillin in the past.  Start amoxicillin 500 mg 3 times daily for 10 days  #2  hypertension poorly controlled.  Repeat blood pressure right arm seated after rest 162/94.  Increase amlodipine back to 10 mg daily.  Recommend follow-up with primary in 1 to 2 months to reassess   No follow-ups on file.    Stephanie Peat, MD

## 2022-11-24 ENCOUNTER — Ambulatory Visit
Admission: EM | Admit: 2022-11-24 | Discharge: 2022-11-24 | Disposition: A | Discharge status: Home / Self Care | Payer: BC Managed Care – PPO | Attending: Physician Assistant | Admitting: Physician Assistant

## 2022-11-24 DIAGNOSIS — J020 Streptococcal pharyngitis: Secondary | ICD-10-CM | POA: Diagnosis not present

## 2022-11-24 DIAGNOSIS — Z1152 Encounter for screening for COVID-19: Secondary | ICD-10-CM | POA: Insufficient documentation

## 2022-11-24 DIAGNOSIS — J069 Acute upper respiratory infection, unspecified: Secondary | ICD-10-CM | POA: Diagnosis not present

## 2022-11-24 NOTE — ED Provider Notes (Signed)
[facility]    CSN: [id number] Arrival date & time: 11/24/22  1419      History   Chief Complaint Chief Complaint  Patient presents with   Headache   Chills   Generalized Body Aches   Otalgia    HPI [name] is a 61 y.o. female.   Patient here today for evaluation of headache, body aches and ear pain that started yesterday after she was diagnosed with strep.  She states that she was screened for COVID and flu and that was negative however she feels as if she is having flulike symptoms now.  She has not taken any Tylenol or ibuprofen but does report some Mucinex and has been taking her antibiotic as prescribed.  She has not had any vomiting or diarrhea.  The history is provided by the patient.    Past Medical History:  Diagnosis Date   Allergy    Dr Caprice Red   Cardiac arrhythmia    Depression    Diabetes mellitus without complication [facility])    Fibroids    uterine sees Dr. Carlota Raspberry in [facility]    Genital warts    Heart murmur    High cholesterol    Hypertension    Irritable bowel syndrome (IBS)    Low back pain    has a herniated lumbar disc sees Dr Marlou Sa    Patient Active Problem List   Diagnosis Date Noted   Plantar fasciitis of right foot 10/09/2022   Degenerative cervical disc 02/02/2021   Rotator cuff syndrome of right shoulder 12/07/2020   COVID-19 virus infection 09/20/2020   Well woman exam with routine gynecological exam 01/07/2020   Breast pain, right 01/07/2020   Depression with anxiety 10/21/2019   Hypercholesterolemia 01/19/2019   Left lumbar radiculopathy 12/25/2018   Right arm pain 11/26/2018   Low back pain 11/26/2018   Left knee pain 10/01/2018   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   Type 2 diabetes mellitus without complication, without long-term current use of insulin ([place]) 02/22/2017   Loss of transverse plantar arch 10/31/2016   Pronation deformity of ankle, acquired 10/31/2016   Patellofemoral pain syndrome  09/23/2015   OSA (obstructive sleep apnea) 03/31/2014   Asthma, chronic 08/14/2013   Leg edema 08/14/2013   Osteoarthritis 09/11/2010   GERD 03/17/2010   SCIATICA 08/30/2009   TROCHANTERIC BURSITIS 08/09/2009   LEG PAIN, BILATERAL 09/30/2007   Essential hypertension 09/22/2007    Past Surgical History:  Procedure Laterality Date   ABDOMINOPLASTY  04/2013   BREAST BIOPSY Right 06/2014   COLONOSCOPY  02/10/2015   per Dr. Hilarie Fredrickson, clear, repeat in 10 yrs    LUMBAR LAMINECTOMY  2010   L3-4 on 02-24-10 per Dr Louanne Skye    MYOMECTOMY     laser ablation of uterine fibroids per Dr Ouida Sills 2000   TUBAL LIGATION      OB History     Gravida  6   Para  4   Term  4   Preterm      AB  2   Living  4      SAB      IAB      Ectopic      Multiple      Live Births               Home Medications    Prior to Admission medications   Medication Sig Start Date End Date Taking? Authorizing Provider  albuterol (VENTOLIN HFA) 108 (90 Base)  MCG/ACT inhaler Inhale 2 puffs into the lungs every 4 (four) hours as needed for wheezing or shortness of breath. 09/20/20   Laurey Morale, MD  amLODipine (NORVASC) 10 MG tablet Take 1 tablet (10 mg total) by mouth daily. 11/23/22   Burchette, Alinda Sierras, MD  amoxicillin (AMOXIL) 500 MG capsule Take 1 capsule (500 mg total) by mouth 3 (three) times daily for 10 days. 11/23/22 12/03/22  Burchette, Alinda Sierras, MD  atorvastatin (LIPITOR) 20 MG tablet Take 1 tablet (20 mg total) by mouth daily. 05/07/22   Laurey Morale, MD  blood glucose meter kit and supplies Dispense based on patient and insurance preference. Test once a day. E11.9 05/04/20   Laurey Morale, MD  cholecalciferol (VITAMIN D) 1000 UNITS tablet Take 1,000 Units by mouth daily.     [provider]  linaclotide Rolan Lipa) 145 MCG CAPS capsule Take 1 capsule (145 mcg total) by mouth daily before breakfast. 11/16/21   Willia Craze, NP  lisinopril (ZESTRIL) 20 MG tablet Take 1 tablet  (20 mg total) by mouth 2 (two) times daily. 05/07/22   Laurey Morale, MD  metFORMIN (GLUCOPHAGE) 500 MG tablet Take 1 tablet (500 mg total) by mouth 2 (two) times daily with a meal. 05/07/22   Laurey Morale, MD  [facility] 17 GM/SCOOP powder 1 dose in 8 ounces of water 1 to 2 times each day 11/01/21   Willia Craze, NP  Multiple Vitamins-Minerals (MULTIVITAMIN WITH MINERALS) tablet Take 1 tablet by mouth daily.    [provider]  ondansetron (ZOFRAN) 8 MG tablet TAKE ONE TABLET BY MOUTH EVERY 6 HOURS AS NEEDED FOR NAUSEA/VOMITING 10/31/21   Laurey Morale, MD  phentermine 37.5 MG capsule Take 1 capsule (37.5 mg total) by mouth every morning. 08/22/22   Laurey Morale, MD  potassium chloride (KLOR-CON M) 10 MEQ tablet Take 2 tablets (20 mEq total) by mouth daily. 05/07/22   Laurey Morale, MD  valACYclovir (VALTREX) 500 MG tablet TAKE ONE TABLET BY MOUTH TWICE A DAY AS NEEDED FOR OUTBREAKS 12/28/19   Laurey Morale, MD    Family History Family History  Problem Relation Age of Onset   Diabetes Mother    Hypertension Mother    Diabetes Father    Hypertension Father    Heart disease Father    Breast cancer Sister    Diabetes Maternal Grandmother    Hypertension Maternal Grandmother    Diabetes Maternal Grandfather    Hypertension Maternal Grandfather    Diabetes Paternal Grandmother    Hypertension Paternal Grandmother    Diabetes Paternal Grandfather    Hypertension Paternal Grandfather    Arthritis Other        Family Hx   Diabetes Other        Family Hx 1st degree relative   Hyperlipidemia Other        Family Hx   Hypertension Other        Family Hx   Prostate cancer Other        Family Hx 1st Degree relative <50   Stroke Other        First Degree Female <50   Colon cancer Neg Hx    Esophageal cancer Neg Hx    Pancreatic cancer Neg Hx    Stomach cancer Neg Hx    Liver disease Neg Hx     Social History Social History   Tobacco Use   Smoking status: Former  Packs/day: 0.50    Years: 1.00    Total pack years: 0.50    Types: Cigarettes    Quit date: 12/17/1996    Years since quitting: 25.9   Smokeless tobacco: Never  Vaping Use   Vaping Use: Never used  Substance Use Topics   Alcohol use: Yes    Alcohol/week: 0.0 standard drinks of alcohol    Comment: occ   Drug use: No     Allergies   Iodinated contrast media, Iodine, Peanut-containing drug products, Hctz [hydrochlorothiazide], Shellfish-derived products, Cephalexin, and Ciprofloxacin   Review of Systems Review of Systems  Constitutional:  Negative for chills and fever.  HENT:  Positive for congestion, ear pain and sore throat.   Eyes:  Negative for discharge and redness.  Respiratory:  Positive for cough. Negative for shortness of breath and wheezing.   Gastrointestinal:  Negative for abdominal pain, diarrhea, nausea and vomiting.  Musculoskeletal:  Positive for myalgias.  Neurological:  Positive for headaches.     Physical Exam Triage Vital Signs ED Triage Vitals  Enc Vitals Group     BP      Pulse      Resp      Temp      Temp src      SpO2      Weight      Height      Head Circumference      Peak Flow      Pain Score      Pain Loc      Pain Edu?      Excl. in [place]?    No data found.  Updated Vital Signs BP 119/85 (BP Location: Right Arm)   Pulse 91   Temp 98.4 F (36.9 C) (Oral)   Resp 18   LMP 10/18/2015   SpO2 96%      Physical Exam Vitals and nursing note reviewed.  Constitutional:      General: She is not in acute distress.    Appearance: Normal appearance. She is not ill-appearing.  HENT:     Head: Normocephalic and atraumatic.     Nose: Congestion present.  Eyes:     Conjunctiva/sclera: Conjunctivae normal.  Cardiovascular:     Rate and Rhythm: Normal rate and regular rhythm.     Heart sounds: Normal heart sounds. No murmur heard. Pulmonary:     Effort: Pulmonary effort is normal. No respiratory distress.     Breath sounds: Normal  breath sounds. No wheezing, rhonchi or rales.  Skin:    General: Skin is warm and dry.  Neurological:     Mental Status: She is alert.  Psychiatric:        Mood and Affect: Mood normal.        Thought Content: Thought content normal.      UC Treatments / Results  Labs (all labs ordered are listed, but only abnormal results are displayed) Labs Reviewed  RESP PANEL BY RT-PCR (FLU A&B, COVID) [id number]    EKG   Radiology No results found.  Procedures Procedures (including critical care time)  Medications Ordered in UC Medications - No data to display  Initial Impression / Assessment and Plan / UC Course  I have reviewed the triage vital signs and the nursing notes.  Pertinent labs & imaging results that were available during my care of the patient were reviewed by me and considered in my medical decision making (see chart for details).    Recommended she continue her antibiotic  for strep as this would also cover possible otitis media.  Will screen for COVID and flu again given new symptoms.  Recommend symptomatic treatment, specifically Tylenol and ibuprofen if needed for headache and body aches.  Encouraged follow-up if symptoms fail to improve or worsen in any way.  Final Clinical Impressions(s) / UC Diagnoses   Final diagnoses:  Acute upper respiratory infection  Strep pharyngitis  Encounter for screening for COVID-19   Discharge Instructions   None    ED Prescriptions   None    PDMP not reviewed this encounter.   Francene Finders, PA-C 11/24/22 [phone]

## 2022-11-24 NOTE — ED Triage Notes (Signed)
Pt states she was diagnosed with strep yesterday; she states since starting antibiotics she now has increased headache. Chills, generalized body aches, and bilateral ear pain.

## 2022-11-25 ENCOUNTER — Ambulatory Visit: Payer: Self-pay

## 2022-11-25 LAB — RESP PANEL BY RT-PCR (FLU A&B, COVID) ARPGX2
Influenza A by PCR: NEGATIVE
Influenza B by PCR: NEGATIVE
SARS Coronavirus 2 by RT PCR: POSITIVE — AB

## 2022-11-26 ENCOUNTER — Telehealth (HOSPITAL_COMMUNITY): Payer: Self-pay | Admitting: Emergency Medicine

## 2022-11-26 MED ORDER — NIRMATRELVIR/RITONAVIR (PAXLOVID)TABLET: 3.0000 | ORAL_TABLET | Freq: Two times a day (BID) | ORAL | 0 refills | Status: AC

## 2022-12-04 ENCOUNTER — Ambulatory Visit: Payer: BC Managed Care – PPO | Admitting: Family Medicine

## 2022-12-05 ENCOUNTER — Ambulatory Visit: Payer: BC Managed Care – PPO | Admitting: Family Medicine

## 2023-03-27 ENCOUNTER — Ambulatory Visit (INDEPENDENT_AMBULATORY_CARE_PROVIDER_SITE_OTHER): Payer: BC Managed Care – PPO | Admitting: Family Medicine

## 2023-03-27 ENCOUNTER — Encounter: Payer: Self-pay | Admitting: Family Medicine

## 2023-03-27 VITALS — BP 130/82 | HR 74 | Temp 98.5°F | Ht 60.0 in | Wt 163.8 lb

## 2023-03-27 DIAGNOSIS — M25462 Effusion, left knee: Secondary | ICD-10-CM

## 2023-03-27 DIAGNOSIS — W19XXXA Unspecified fall, initial encounter: Secondary | ICD-10-CM | POA: Diagnosis not present

## 2023-03-27 DIAGNOSIS — M25562 Pain in left knee: Secondary | ICD-10-CM | POA: Diagnosis not present

## 2023-03-27 MED ORDER — MELOXICAM 7.5 MG PO TABS: 7.5000 mg | ORAL_TABLET | Freq: Every day | ORAL | 0 refills | Status: DC

## 2023-03-27 NOTE — Patient Instructions (Signed)
A prescription for Mobic was sent to your pharmacy.  This is a medicine for pain/inflammation kind of like ibuprofen.  You can take this once daily.  For continued pain use Tylenol if needed.  Continue wrapping your knee to provide compression and help reduce swelling along with using ice.  For continued or worsening symptoms in the next week follow-up in clinic for imaging.

## 2023-03-27 NOTE — Progress Notes (Signed)
Established Patient Office Visit   Subjective  Patient ID: [name], female    DOB: 1961-05-16  Age: 62 y.o. MRN: [id number]  Chief Complaint  Patient presents with   Knee Pain    Left knee pain due to injury this morning,; she fell hard on it this morning. Has some tenderness but no meds taken. She wants to make sure nothing is broken or sprained    Patient is a 62 year old female followed by Dr. Clent Ridges and seen for acute concern.  Patient endorses walking in the dark and tripping over a tent that was in a bag.  Patient landed on left knee and may have hit head against wall.  Patient got help getting up.  Able to bear weight on LLE.  Endorses mild edema, stiffness prolonged sitting and with bending knee.  Notices several areas of ecchymosis on extremities.  Knee Pain  The incident occurred 6 to 12 hours ago. The incident occurred at home. The injury mechanism was a fall. The pain is present in the left knee. The pain is moderate. The pain has been Constant since onset. Pertinent negatives include no inability to bear weight, loss of motion, muscle weakness, numbness or tingling. She reports no foreign bodies present. The symptoms are aggravated by movement. She has tried nothing for the symptoms.    Patient Active Problem List   Diagnosis Date Noted   Plantar fasciitis of right foot 10/09/2022   Degenerative cervical disc 02/02/2021   Rotator cuff syndrome of right shoulder 12/07/2020   COVID-19 virus infection 09/20/2020   Well woman exam with routine gynecological exam 01/07/2020   Breast pain, right 01/07/2020   Depression with anxiety 10/21/2019   Hypercholesterolemia 01/19/2019   Left lumbar radiculopathy 12/25/2018   Right arm pain 11/26/2018   Low back pain 11/26/2018   Left knee pain 10/01/2018   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   Type 2 diabetes mellitus without complication, without long-term current use of insulin 02/22/2017   Loss of transverse plantar arch  10/31/2016   Pronation deformity of ankle, acquired 10/31/2016   Patellofemoral pain syndrome 09/23/2015   OSA (obstructive sleep apnea) 03/31/2014   Asthma, chronic 08/14/2013   Leg edema 08/14/2013   Osteoarthritis 09/11/2010   GERD 03/17/2010   SCIATICA 08/30/2009   TROCHANTERIC BURSITIS 08/09/2009   LEG PAIN, BILATERAL 09/30/2007   Essential hypertension 09/22/2007   Past Surgical History:  Procedure Laterality Date   ABDOMINOPLASTY  04/2013   BREAST BIOPSY Right 06/2014   COLONOSCOPY  02/10/2015   per Dr. Rhea Belton, clear, repeat in 10 yrs    LUMBAR LAMINECTOMY  2010   L3-4 on 02-24-10 per Dr Otelia Sergeant    MYOMECTOMY     laser ablation of uterine fibroids per Dr Dareen Piano 2000   TUBAL LIGATION     Social History   Tobacco Use   Smoking status: Former    Packs/day: 0.50    Years: 1.00    Additional pack years: 0.00    Total pack years: 0.50    Types: Cigarettes    Quit date: 12/17/1996    Years since quitting: 26.2   Smokeless tobacco: Never  Vaping Use   Vaping Use: Never used  Substance Use Topics   Alcohol use: Yes    Alcohol/week: 0.0 standard drinks of alcohol    Comment: occ   Drug use: No   Family History  Problem Relation Age of Onset   Diabetes Mother    Hypertension Mother  Diabetes Father    Hypertension Father    Heart disease Father    Breast cancer Sister    Diabetes Maternal Grandmother    Hypertension Maternal Grandmother    Diabetes Maternal Grandfather    Hypertension Maternal Grandfather    Diabetes Paternal Grandmother    Hypertension Paternal Grandmother    Diabetes Paternal Grandfather    Hypertension Paternal Grandfather    Arthritis Other        Family Hx   Diabetes Other        Family Hx 1st degree relative   Hyperlipidemia Other        Family Hx   Hypertension Other        Family Hx   Prostate cancer Other        Family Hx 1st Degree relative <50   Stroke Other        First Degree Female <50   Colon cancer Neg Hx     Esophageal cancer Neg Hx    Pancreatic cancer Neg Hx    Stomach cancer Neg Hx    Liver disease Neg Hx    Allergies  Allergen Reactions   Iodinated Contrast Media Swelling    Tongue swelling   Iodine Swelling    Tongue swelling   Peanut-Containing Drug Products Anaphylaxis   Hctz [Hydrochlorothiazide] Other (See Comments)    Blood pressure goes up   [employer] Other (See Comments)     Per allergy test   Cephalexin Rash   Ciprofloxacin Diarrhea      Review of Systems  Neurological:  Negative for tingling and numbness.   Negative unless stated above    Objective:     BP 130/82   Pulse 74   Temp 98.5 F (36.9 C) (Oral)   Ht 5' (1.524 m)   Wt 163 lb 12.8 oz (74.3 kg)   LMP 10/18/2015   SpO2 99%   BMI 31.99 kg/m    Physical Exam Constitutional:      General: She is not in acute distress.    Appearance: Normal appearance.  HENT:     Head: Normocephalic and atraumatic.     Nose: Nose normal.     Mouth/Throat:     Mouth: Mucous membranes are moist.  Cardiovascular:     Rate and Rhythm: Normal rate and regular rhythm.     Heart sounds: Normal heart sounds. No murmur heard.    No gallop.  Pulmonary:     Effort: Pulmonary effort is normal. No respiratory distress.     Breath sounds: Normal breath sounds. No wheezing, rhonchi or rales.  Musculoskeletal:     Right knee: Normal.     Left knee: Swelling, effusion and ecchymosis present. No crepitus. Tenderness present over the lateral joint line and patellar tendon. No LCL laxity, MCL laxity, ACL laxity or PCL laxity.Normal alignment and normal patellar mobility. Normal pulse.       Legs:     Comments: 5 mm area of erythema/ecchymosis upper lateral left knee.  TTP of L knee at tibial plateau, lateral joint line.  Left knee wrapped with Ace bandage while in clinic.  Skin:    General: Skin is warm and dry.  Neurological:     Mental Status: She is alert and oriented to person, place, and time.      Gait: Gait is intact.      No results found for any visits on 03/27/23.    Assessment & Plan:  Acute pain of left knee -  Meloxicam; Take 1 tablet (7.5 mg total) by mouth daily.  Dispense: 30 tablet; Refill: 0  Swelling of left knee joint -     Meloxicam; Take 1 tablet (7.5 mg total) by mouth daily.  Dispense: 30 tablet; Refill: 0  Fall from standing, initial encounter  New problem.  Acute knee pain s/p fall from standing this a.m.  RICE.  Knee wrapped with Ace bandage while in clinic.  Discussed other supportive care and fall prevention.  For continued or worsening symptoms obtain imaging.  Return if symptoms worsen or fail to improve.   Deeann Saint, MD

## 2023-04-02 ENCOUNTER — Ambulatory Visit: Payer: BC Managed Care – PPO | Admitting: Family Medicine

## 2023-04-02 ENCOUNTER — Encounter: Payer: Self-pay | Admitting: Family Medicine

## 2023-04-02 VITALS — BP 110/78 | HR 75 | Temp 97.9°F | Wt 167.0 lb

## 2023-04-02 DIAGNOSIS — E119 Type 2 diabetes mellitus without complications: Secondary | ICD-10-CM | POA: Diagnosis not present

## 2023-04-02 MED ORDER — SEMAGLUTIDE(0.25 OR 0.5MG/DOS) 2 MG/1.5ML ~~LOC~~ SOPN: 0.2500 mg | PEN_INJECTOR | SUBCUTANEOUS | 0 refills | Status: DC

## 2023-04-02 NOTE — Progress Notes (Signed)
Subjective:    Patient ID: [name], female    DOB: May 22, 1961, 62 y.o.   MRN: [id number]  HPI Here to follow up on diabetes and weight gain. She had to stop the Phentermine because it no longer helped much, and it caused some constipation and insomnia. She is very interested in trying a GLP-1 agonist instead.    Review of Systems  Constitutional: Negative.   Respiratory: Negative.    Cardiovascular: Negative.        Objective:   Physical Exam Constitutional:      Appearance: Normal appearance.  Cardiovascular:     Rate and Rhythm: Normal rate and regular rhythm.     Pulses: Normal pulses.     Heart sounds: Normal heart sounds.  Pulmonary:     Effort: Pulmonary effort is normal.     Breath sounds: Normal breath sounds.  Neurological:     Mental Status: She is alert.           Assessment & Plan:  Type 2 diabetes. We will check a BMET and an A1c today. She will try Semaglutide 0.25 mg weekly for 4 weeks.  Gershon Crane, MD

## 2023-04-03 ENCOUNTER — Ambulatory Visit: Payer: BC Managed Care – PPO | Admitting: Family Medicine

## 2023-04-03 LAB — BASIC METABOLIC PANEL
BUN: 18 mg/dL (ref 6–23)
CO2: 28 mEq/L (ref 19–32)
Calcium: 9.6 mg/dL (ref 8.4–10.5)
Chloride: 103 mEq/L (ref 96–112)
Creatinine, Ser: 0.86 mg/dL (ref 0.40–1.20)
GFR: 73.56 mL/min (ref >60.00)
Glucose, Bld: 76 mg/dL (ref 70–99)
Potassium: 4.2 mEq/L (ref 3.5–5.1)
Sodium: 140 mEq/L (ref 135–145)

## 2023-04-03 LAB — HEMOGLOBIN A1C: Hgb A1c MFr Bld: 6.3 % (ref 4.6–6.5)

## 2023-05-09 ENCOUNTER — Encounter: Payer: Self-pay | Admitting: Family Medicine

## 2023-05-09 ENCOUNTER — Ambulatory Visit (INDEPENDENT_AMBULATORY_CARE_PROVIDER_SITE_OTHER): Payer: BC Managed Care – PPO | Admitting: Family Medicine

## 2023-05-09 VITALS — BP 120/82 | HR 82 | Temp 98.0°F | Ht 60.0 in | Wt 164.0 lb

## 2023-05-09 DIAGNOSIS — E559 Vitamin D deficiency, unspecified: Secondary | ICD-10-CM | POA: Diagnosis not present

## 2023-05-09 DIAGNOSIS — Z0000 Encounter for general adult medical examination without abnormal findings: Secondary | ICD-10-CM

## 2023-05-09 LAB — VITAMIN D 25 HYDROXY (VIT D DEFICIENCY, FRACTURES): VITD: 23.21 ng/mL — ABNORMAL LOW (ref 30.00–100.00)

## 2023-05-09 LAB — CBC WITH DIFFERENTIAL/PLATELET
Basophils Absolute: 0 10*3/uL (ref 0.0–0.1)
Basophils Relative: 0.4 % (ref 0.0–3.0)
Eosinophils Absolute: 0.2 10*3/uL (ref 0.0–0.7)
Eosinophils Relative: 2.9 % (ref 0.0–5.0)
HCT: 41.8 % (ref 36.0–46.0)
Hemoglobin: 13.8 g/dL (ref 12.0–15.0)
Lymphocytes Relative: 38 % (ref 12.0–46.0)
Lymphs Abs: 2.9 10*3/uL (ref 0.7–4.0)
MCHC: 33.1 g/dL (ref 30.0–36.0)
MCV: 88.8 fl (ref 78.0–100.0)
Monocytes Absolute: 0.6 10*3/uL (ref 0.1–1.0)
Monocytes Relative: 7.4 % (ref 3.0–12.0)
Neutro Abs: 3.9 10*3/uL (ref 1.4–7.7)
Neutrophils Relative %: 51.3 % (ref 43.0–77.0)
Platelets: 397 10*3/uL (ref 150.0–400.0)
RBC: 4.7 Mil/uL (ref 3.87–5.11)
RDW: 14.1 % (ref 11.5–15.5)
WBC: 7.5 10*3/uL (ref 4.0–10.5)

## 2023-05-09 LAB — BASIC METABOLIC PANEL
BUN: 12 mg/dL (ref 6–23)
CO2: 29 mEq/L (ref 19–32)
Calcium: 9.5 mg/dL (ref 8.4–10.5)
Chloride: 106 mEq/L (ref 96–112)
Creatinine, Ser: 0.84 mg/dL (ref 0.40–1.20)
GFR: 75.62 mL/min (ref >60.00)
Glucose, Bld: 97 mg/dL (ref 70–99)
Potassium: 4.5 mEq/L (ref 3.5–5.1)
Sodium: 140 mEq/L (ref 135–145)

## 2023-05-09 LAB — LIPID PANEL
Cholesterol: 155 mg/dL (ref 0–200)
HDL: 45.2 mg/dL (ref >39.00)
LDL Cholesterol: 83 mg/dL (ref 0–99)
NonHDL: 109.82
Total CHOL/HDL Ratio: 3
Triglycerides: 135 mg/dL (ref 0.0–149.0)
VLDL: 27 mg/dL (ref 0.0–40.0)

## 2023-05-09 LAB — HEPATIC FUNCTION PANEL
ALT: 20 U/L (ref 0–35)
AST: 17 U/L (ref 0–37)
Albumin: 4.4 g/dL (ref 3.5–5.2)
Alkaline Phosphatase: 126 U/L — ABNORMAL HIGH (ref 39–117)
Bilirubin, Direct: 0.1 mg/dL (ref 0.0–0.3)
Total Bilirubin: 0.7 mg/dL (ref 0.2–1.2)
Total Protein: 6.8 g/dL (ref 6.0–8.3)

## 2023-05-09 LAB — TSH: TSH: 1.03 u[IU]/mL (ref 0.35–5.50)

## 2023-05-09 LAB — HEMOGLOBIN A1C: Hgb A1c MFr Bld: 6.1 % (ref 4.6–6.5)

## 2023-05-09 MED ORDER — METFORMIN HCL 500 MG PO TABS: 500.0000 mg | ORAL_TABLET | Freq: Two times a day (BID) | ORAL | 3 refills | Status: DC

## 2023-05-09 MED ORDER — SEMAGLUTIDE (1 MG/DOSE) 4 MG/3ML ~~LOC~~ SOPN: 1.0000 mg | PEN_INJECTOR | SUBCUTANEOUS | 5 refills | Status: DC

## 2023-05-09 MED ORDER — LISINOPRIL 20 MG PO TABS: 20.0000 mg | ORAL_TABLET | Freq: Two times a day (BID) | ORAL | 3 refills | Status: DC

## 2023-05-09 MED ORDER — ALBUTEROL SULFATE HFA 108 (90 BASE) MCG/ACT IN AERS: 2.0000 | INHALATION_SPRAY | RESPIRATORY_TRACT | 11 refills | Status: AC | PRN

## 2023-05-09 MED ORDER — ATORVASTATIN CALCIUM 20 MG PO TABS: 20.0000 mg | ORAL_TABLET | Freq: Every day | ORAL | 3 refills | Status: DC

## 2023-05-09 NOTE — Progress Notes (Signed)
Subjective:    Patient ID: [name], female    DOB: September 04, 1961, 62 y.o.   MRN: [id number]  HPI Here for a well exam. She feels fine. She has taken Semaglutide shots for 6 weeks now, and she has tolerated them well. She has lost 3 lbs. She gets regular mammograms. She had a normal diabetic eye exam in February.    Review of Systems  Constitutional: Negative.   HENT: Negative.    Eyes: Negative.   Respiratory: Negative.    Cardiovascular: Negative.   Gastrointestinal: Negative.   Genitourinary:  Negative for decreased urine volume, difficulty urinating, dyspareunia, dysuria, enuresis, flank pain, frequency, hematuria, pelvic pain and urgency.  Musculoskeletal: Negative.   Skin: Negative.   Neurological: Negative.  Negative for headaches.  Psychiatric/Behavioral: Negative.         Objective:   Physical Exam Constitutional:      General: She is not in acute distress.    Appearance: Normal appearance. She is well-developed.  HENT:     Head: Normocephalic and atraumatic.     Right Ear: External ear normal.     Left Ear: External ear normal.     Nose: Nose normal.     Mouth/Throat:     Pharynx: No oropharyngeal exudate.  Eyes:     General: No scleral icterus.    Conjunctiva/sclera: Conjunctivae normal.     Pupils: Pupils are equal, round, and reactive to light.  Neck:     Thyroid: No thyromegaly.     Vascular: No JVD.  Cardiovascular:     Rate and Rhythm: Normal rate and regular rhythm.     Pulses: Normal pulses.     Heart sounds: Normal heart sounds. No murmur heard.    No friction rub. No gallop.  Pulmonary:     Effort: Pulmonary effort is normal. No respiratory distress.     Breath sounds: Normal breath sounds. No wheezing or rales.  Chest:     Chest wall: No tenderness.  Abdominal:     General: Bowel sounds are normal. There is no distension.     Palpations: Abdomen is soft. There is no mass.     Tenderness: There is no abdominal tenderness. There is no  guarding or rebound.  Musculoskeletal:        General: No tenderness. Normal range of motion.     Cervical back: Normal range of motion and neck supple.  Lymphadenopathy:     Cervical: No cervical adenopathy.  Skin:    General: Skin is warm and dry.     Findings: No erythema or rash.  Neurological:     Mental Status: She is alert and oriented to person, place, and time.     Cranial Nerves: No cranial nerve deficit.     Motor: No abnormal muscle tone.     Coordination: Coordination normal.     Deep Tendon Reflexes: Reflexes are normal and symmetric. Reflexes normal.  Psychiatric:        Behavior: Behavior normal.        Thought Content: Thought content normal.        Judgment: Judgment normal.           Assessment & Plan:  Well exam. We discussed diet and exercise. Get fasting labs. We will increase the Semaglutide dose to 1 mg weekly.  Gershon Crane, MD

## 2023-05-13 ENCOUNTER — Encounter: Payer: Self-pay | Admitting: Family Medicine

## 2023-05-14 ENCOUNTER — Other Ambulatory Visit: Payer: Self-pay

## 2023-05-14 MED ORDER — VITAMIN D (ERGOCALCIFEROL) 1.25 MG (50000 UNIT) PO CAPS: 50000.0000 [IU] | ORAL_CAPSULE | ORAL | 3 refills | Status: DC

## 2023-05-21 ENCOUNTER — Other Ambulatory Visit: Payer: Self-pay

## 2023-06-10 ENCOUNTER — Other Ambulatory Visit: Payer: Self-pay

## 2023-06-10 ENCOUNTER — Emergency Department (HOSPITAL_COMMUNITY)
Admission: EM | Admit: 2023-06-10 | Discharge: 2023-06-10 | Disposition: A | Discharge status: Home / Self Care | Payer: BC Managed Care – PPO | Attending: Emergency Medicine | Admitting: Emergency Medicine

## 2023-06-10 DIAGNOSIS — R112 Nausea with vomiting, unspecified: Secondary | ICD-10-CM | POA: Diagnosis present

## 2023-06-10 DIAGNOSIS — Z79899 Other long term (current) drug therapy: Secondary | ICD-10-CM | POA: Diagnosis not present

## 2023-06-10 DIAGNOSIS — Z7984 Long term (current) use of oral hypoglycemic drugs: Secondary | ICD-10-CM | POA: Diagnosis not present

## 2023-06-10 DIAGNOSIS — K529 Noninfective gastroenteritis and colitis, unspecified: Secondary | ICD-10-CM | POA: Insufficient documentation

## 2023-06-10 DIAGNOSIS — J029 Acute pharyngitis, unspecified: Secondary | ICD-10-CM | POA: Insufficient documentation

## 2023-06-10 LAB — CBC WITH DIFFERENTIAL/PLATELET
Abs Immature Granulocytes: 0.02 10*3/uL (ref 0.00–0.07)
Basophils Absolute: 0 10*3/uL (ref 0.0–0.1)
Basophils Relative: 1 %
Eosinophils Absolute: 0.2 10*3/uL (ref 0.0–0.5)
Eosinophils Relative: 3 %
HCT: 41.6 % (ref 36.0–46.0)
Hemoglobin: 13.8 g/dL (ref 12.0–15.0)
Immature Granulocytes: 0 %
Lymphocytes Relative: 26 %
Lymphs Abs: 2 10*3/uL (ref 0.7–4.0)
MCH: 29.1 pg (ref 26.0–34.0)
MCHC: 33.2 g/dL (ref 30.0–36.0)
MCV: 87.8 fL (ref 80.0–100.0)
Monocytes Absolute: 0.7 10*3/uL (ref 0.1–1.0)
Monocytes Relative: 9 %
Neutro Abs: 4.7 10*3/uL (ref 1.7–7.7)
Neutrophils Relative %: 61 %
Platelets: 350 10*3/uL (ref 150–400)
RBC: 4.74 MIL/uL (ref 3.87–5.11)
RDW: 13.2 % (ref 11.5–15.5)
WBC: 7.6 10*3/uL (ref 4.0–10.5)
nRBC: 0 % (ref 0.0–0.2)

## 2023-06-10 LAB — COMPREHENSIVE METABOLIC PANEL
ALT: 25 U/L (ref 0–44)
AST: 21 U/L (ref 15–41)
Albumin: 4 g/dL (ref 3.5–5.0)
Alkaline Phosphatase: 110 U/L (ref 38–126)
Anion gap: 7 (ref 5–15)
BUN: 15 mg/dL (ref 6–20)
CO2: 23 mmol/L (ref 22–32)
Calcium: 9 mg/dL (ref 8.9–10.3)
Chloride: 110 mmol/L (ref 98–111)
Creatinine, Ser: 0.86 mg/dL (ref 0.44–1.00)
GFR, Estimated: >60 mL/min (ref >60)
Glucose, Bld: 88 mg/dL (ref 70–99)
Potassium: 3.6 mmol/L (ref 3.5–5.1)
Sodium: 140 mmol/L (ref 135–145)
Total Bilirubin: 0.9 mg/dL (ref 0.3–1.2)
Total Protein: 7 g/dL (ref 6.5–8.1)

## 2023-06-10 MED ORDER — ONDANSETRON 4 MG PO TBDP: ORAL_TABLET | ORAL | 0 refills | Status: AC

## 2023-06-10 MED ORDER — SODIUM CHLORIDE 0.9 % IV BOLUS
1000.0000 mL | Freq: Once | INTRAVENOUS | Status: AC
  Administered 2023-06-10: 1000 mL via INTRAVENOUS

## 2023-06-10 MED ORDER — ONDANSETRON HCL 4 MG/2ML IJ SOLN
4.0000 mg | Freq: Once | INTRAMUSCULAR | Status: AC
  Administered 2023-06-10: 4 mg via INTRAVENOUS
  Filled 2023-06-10: qty 2

## 2023-06-10 MED ORDER — KETOROLAC TROMETHAMINE 30 MG/ML IJ SOLN
30.0000 mg | Freq: Once | INTRAMUSCULAR | Status: AC
  Administered 2023-06-10: 30 mg via INTRAVENOUS
  Filled 2023-06-10: qty 1

## 2023-06-10 NOTE — ED Provider Notes (Signed)
[place] EMERGENCY DEPARTMENT AT [facility] Provider Note   CSN: [id number] Arrival date & time: 06/10/23  1015     History {Add pertinent medical, surgical, social history, OB history to HPI:1} Chief Complaint  Patient presents with   Emesis   Diarrhea   Sore Throat    [name] is a 62 y.o. female.  Patient was on a cruise and complains of nausea vomiting and diarrhea for a few days.  The vomiting has improved   Emesis Associated symptoms: diarrhea   Diarrhea Associated symptoms: vomiting   Sore Throat       Home Medications Prior to Admission medications   Medication Sig Start Date End Date Taking? Authorizing Provider  ondansetron (ZOFRAN-ODT) 4 MG disintegrating tablet 4mg  ODT q4 hours prn nausea/vomit 06/10/23  Yes Bethann Berkshire, MD  albuterol (VENTOLIN HFA) 108 (90 Base) MCG/ACT inhaler Inhale 2 puffs into the lungs every 4 (four) hours as needed for wheezing or shortness of breath. 05/09/23   Nelwyn Salisbury, MD  amLODipine (NORVASC) 10 MG tablet Take 1 tablet (10 mg total) by mouth daily. 11/23/22   Burchette, Elberta Fortis, MD  atorvastatin (LIPITOR) 20 MG tablet Take 1 tablet (20 mg total) by mouth daily. 05/09/23   Nelwyn Salisbury, MD  blood glucose meter kit and supplies Dispense based on patient and insurance preference. Test once a day. E11.9 05/04/20   Nelwyn Salisbury, MD  cholecalciferol (VITAMIN D) 1000 UNITS tablet Take 1,000 Units by mouth daily.     [provider]  lisinopril (ZESTRIL) 20 MG tablet Take 1 tablet (20 mg total) by mouth 2 (two) times daily. 05/09/23   Nelwyn Salisbury, MD  metFORMIN (GLUCOPHAGE) 500 MG tablet Take 1 tablet (500 mg total) by mouth 2 (two) times daily with a meal. 05/09/23   Nelwyn Salisbury, MD  Multiple Vitamins-Minerals (MULTIVITAMIN WITH MINERALS) tablet Take 1 tablet by mouth daily.    [provider]  ondansetron (ZOFRAN) 8 MG tablet TAKE ONE TABLET BY MOUTH EVERY 6 HOURS AS NEEDED FOR  NAUSEA/VOMITING 10/31/21   Nelwyn Salisbury, MD  potassium chloride (KLOR-CON M) 10 MEQ tablet Take 2 tablets (20 mEq total) by mouth daily. 05/07/22   Nelwyn Salisbury, MD  Semaglutide, 1 MG/DOSE, 4 MG/3ML SOPN Inject 1 mg into the skin once a week. 05/09/23   Nelwyn Salisbury, MD  valACYclovir (VALTREX) 500 MG tablet TAKE ONE TABLET BY MOUTH TWICE A DAY AS NEEDED FOR OUTBREAKS 12/28/19   Nelwyn Salisbury, MD  Vitamin D, Ergocalciferol, (DRISDOL) 1.25 MG (50000 UNIT) CAPS capsule Take 1 capsule (50,000 Units total) by mouth every 7 (seven) days. 05/14/23   Deeann Saint, MD      Allergies    Iodinated contrast media, Iodine, Peanut-containing drug products, Hctz [hydrochlorothiazide], Shellfish-derived products, Cephalexin, and Ciprofloxacin    Review of Systems   Review of Systems  Gastrointestinal:  Positive for diarrhea and vomiting.    Physical Exam Updated Vital Signs BP (!) 134/105   Pulse 74   Temp 98 F (36.7 C) (Oral)   Resp 18   Ht 5\' 3"  (1.6 m)   Wt 72.6 kg   LMP 10/18/2015   SpO2 96%   BMI 28.34 kg/m  Physical Exam  ED Results / Procedures / Treatments   Labs (all labs ordered are listed, but only abnormal results are displayed) Labs Reviewed  GASTROINTESTINAL PANEL BY PCR, STOOL (REPLACES STOOL CULTURE)  CBC WITH DIFFERENTIAL/PLATELET  COMPREHENSIVE METABOLIC PANEL    EKG None  Radiology No results found.  Procedures Procedures  {Document cardiac monitor, telemetry assessment procedure when appropriate:1}  Medications Ordered in ED Medications  sodium chloride 0.9 % bolus 1,000 mL (0 mLs Intravenous Stopped 06/10/23 1310)  ondansetron (ZOFRAN) injection 4 mg (4 mg Intravenous Given 06/10/23 1119)  ketorolac (TORADOL) 30 MG/ML injection 30 mg (30 mg Intravenous Given 06/10/23 1119)    ED Course/ Medical Decision Making/ A&P   {   Click here for ABCD2, HEART and other calculatorsREFRESH Note before signing :1}                          Medical Decision  Making Amount and/or Complexity of Data Reviewed Labs: ordered.  Risk Prescription drug management.   Patient with gastroenteritis and mild dehydration.  She is hydrated with normal saline and will be discharged home with Zofran.  Patient may also give stool specimen  {Document critical care time when appropriate:1} {Document review of labs and clinical decision tools ie heart score, Chads2Vasc2 etc:1}  {Document your independent review of radiology images, and any outside records:1} {Document your discussion with family members, caretakers, and with consultants:1} {Document social determinants of health affecting pt's care:1} {Document your decision making why or why not admission, treatments were needed:1} Final Clinical Impression(s) / ED Diagnoses Final diagnoses:  Gastroenteritis    Rx / DC Orders ED Discharge Orders          Ordered    ondansetron (ZOFRAN-ODT) 4 MG disintegrating tablet        06/10/23 1455

## 2023-06-10 NOTE — ED Notes (Signed)
Pt tolerating PO fluids. No episodes of vomiting.

## 2023-06-10 NOTE — ED Notes (Signed)
Pt provided discharge instructions and prescription information. Pt was given the opportunity to ask questions and questions were answered.   

## 2023-06-10 NOTE — ED Triage Notes (Signed)
Pt reports NVD x3 days. Got back from a cruise to the [place] yesterday. Sore throat coming and going, runny nose today.

## 2023-06-10 NOTE — Discharge Instructions (Signed)
Drink plenty of fluids.  Follow-up with your doctor later this week if not improving.  Return the stool specimen to the lab if you continue with diarrhea.  Take Tylenol or Motrin for discomfort

## 2023-06-21 ENCOUNTER — Encounter (HOSPITAL_COMMUNITY): Payer: Self-pay

## 2023-06-21 ENCOUNTER — Other Ambulatory Visit: Payer: Self-pay | Admitting: Family Medicine

## 2023-06-21 ENCOUNTER — Emergency Department (HOSPITAL_COMMUNITY)
Admission: EM | Admit: 2023-06-21 | Discharge: 2023-06-21 | Disposition: A | Discharge status: Home / Self Care | Payer: BC Managed Care – PPO | Attending: Emergency Medicine | Admitting: Emergency Medicine

## 2023-06-21 ENCOUNTER — Other Ambulatory Visit: Payer: Self-pay

## 2023-06-21 DIAGNOSIS — R197 Diarrhea, unspecified: Secondary | ICD-10-CM | POA: Diagnosis present

## 2023-06-21 LAB — C DIFFICILE QUICK SCREEN W PCR REFLEX
C Diff antigen: NEGATIVE
C Diff interpretation: NOT DETECTED
C Diff toxin: NEGATIVE

## 2023-06-21 MED ORDER — DICYCLOMINE HCL 20 MG PO TABS: 20.0000 mg | ORAL_TABLET | Freq: Two times a day (BID) | ORAL | 0 refills | Status: DC

## 2023-06-21 NOTE — ED Provider Notes (Signed)
[place] EMERGENCY DEPARTMENT AT [facility] Provider Note   CSN: [id number] Arrival date & time: 06/21/23  2049     History Chief Complaint  Patient presents with   Diarrhea    HPI [name] is a 62 y.o. female presenting for chief complaint of diarrhea for 2 weeks. She denies fevers chills nausea vomiting syncope or shortness of breath.  Was seen 2 weeks ago for diarrheal illness, diagnosed gastroenteritis was told to provide a stool sample.  Presented to the lab but they could not accepted sample she comes in for further care and management. States that it had resolved but returned tonight.  States it is mucousy and liquidy.  Denies recent antibiotic exposure.  States it did resolve for a week since her last visit but started again 2 days ago with severe cramping sensation after she defecates.  Denies bloody diarrhea..   Patient's recorded medical, surgical, social, medication list and allergies were reviewed in the Snapshot window as part of the initial history.   Review of Systems   Review of Systems  Constitutional:  Negative for chills and fever.  HENT:  Negative for ear pain and sore throat.   Eyes:  Negative for pain and visual disturbance.  Respiratory:  Negative for cough and shortness of breath.   Cardiovascular:  Negative for chest pain and palpitations.  Gastrointestinal:  Positive for abdominal pain and diarrhea. Negative for vomiting.  Genitourinary:  Negative for dysuria and hematuria.  Musculoskeletal:  Negative for arthralgias and back pain.  Skin:  Negative for color change and rash.  Neurological:  Negative for seizures and syncope.  All other systems reviewed and are negative.   Physical Exam Updated Vital Signs BP 132/87   Pulse 60   Temp 98.1 F (36.7 C) (Oral)   Resp 15   LMP 10/18/2015   SpO2 100%  Physical Exam Vitals and nursing note reviewed.  Constitutional:      General: She is not in acute distress.    Appearance: She  is well-developed.  HENT:     Head: Normocephalic and atraumatic.  Eyes:     Conjunctiva/sclera: Conjunctivae normal.  Cardiovascular:     Rate and Rhythm: Normal rate and regular rhythm.     Heart sounds: No murmur heard. Pulmonary:     Effort: Pulmonary effort is normal. No respiratory distress.     Breath sounds: Normal breath sounds.  Abdominal:     General: There is no distension.     Palpations: Abdomen is soft.     Tenderness: There is no abdominal tenderness. There is no right CVA tenderness or left CVA tenderness.  Musculoskeletal:        General: No swelling or tenderness. Normal range of motion.     Cervical back: Neck supple.  Skin:    General: Skin is warm and dry.  Neurological:     General: No focal deficit present.     Mental Status: She is alert and oriented to person, place, and time. Mental status is at baseline.     Cranial Nerves: No cranial nerve deficit.      ED Course/ Medical Decision Making/ A&P    Procedures Procedures   Medications Ordered in ED Medications - No data to display Medical Decision Making:   [name] is a 62 y.o. female who presented to the ED today with abdominal pain, detailed above.    Complete initial physical exam performed, notably the patient  was hemodynamically stable  no acute distress and currently asymptomatic.     Reviewed and confirmed nursing documentation for past medical history, family history, social history.    Initial Assessment:   With the patient's presentation of abdominal pain, most likely diagnosis is nonspecific etiology. Other diagnoses were considered including (but not limited to) gastroenteritis, colitis, small bowel obstruction, appendicitis, cholecystitis, pancreatitis, nephrolithiasis, UTI, pyleonephritis. These are considered less likely due to history of present illness and physical exam findings.   This is most consistent with an acute life/limb threatening illness complicated by underlying  chronic conditions.   Initial Plan:  Considered repeating CBC/CMP to evaluate for underlying infectious/metabolic etiology for patient's abdominal pain, however her electrodes were normal 2 weeks ago and her symptoms have been intermittent.  Her exam is benign at this time Considered CTAB/Pelvis with contrast to evaluate for structural/surgical etiology of patients' abdominal pain, however symptoms not present at this time and are mostly only related to defecation.  Empiric management of symptoms with escalating pain control and antiemetics as needed.   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  Discussed this with the patient, she should likely be on a smooth muscle relaxer like Bentyl and follow-up with gastroenterology.  Given the recurrent combined modal nature of the illness, infectious pathology is on the differential though less likely.  Stool samples were collected here in the emergency room and they were sent off to lab for testing.  Patient will follow-up results asynchronously with primary care provider.  Referred to gastroenterology for further diagnostic care and management of her protracted diarrheal syndrome and abdominal pain.  Disposition:  I have considered need for hospitalization, however, considering all of the above, I believe this patient is stable for discharge at this time.  Patient/family educated about specific return precautions for given chief complaint and symptoms.  Patient/family educated about follow-up with PCP and GI.     Patient/family expressed understanding of return precautions and need for follow-up. Patient spoken to regarding all imaging and laboratory results and appropriate follow up for these results. All education provided in verbal form with additional information in written form. Time was allowed for answering of patient questions. Patient discharged.     Emergency Department Medication Summary:   Medications - No data to display     Clinical Impression:  1. Diarrhea of presumed infectious origin      Discharge   Final Clinical Impression(s) / ED Diagnoses Final diagnoses:  Diarrhea of presumed infectious origin    Rx / DC Orders ED Discharge Orders          Ordered    dicyclomine (BENTYL) 20 MG tablet  2 times daily        06/21/23 2116    Ambulatory referral to Gastroenterology        06/21/23 2116              Glyn Ade, MD 06/21/23 2130

## 2023-06-21 NOTE — ED Triage Notes (Signed)
Pt states that she was seen x 2 weeks ago for abdominal pain and diarrhea and a stool sample was ordered upon discharge, but pt had no more pain nor diarrhea until yesterday. She tried to take her sample to the lab and the lab stated that her order was expired. Pt wants to be reevaluated for symptoms.

## 2023-06-21 NOTE — Telephone Encounter (Signed)
Pt LOV was on 05/09/23 Last refill was done on 05/09/23 Please advise

## 2023-06-22 LAB — GASTROINTESTINAL PANEL BY PCR, STOOL (REPLACES STOOL CULTURE)

## 2023-06-24 ENCOUNTER — Ambulatory Visit: Payer: BC Managed Care – PPO | Admitting: Family Medicine

## 2023-06-24 ENCOUNTER — Encounter: Payer: Self-pay | Admitting: Family Medicine

## 2023-06-24 VITALS — BP 110/72 | HR 88 | Temp 98.2°F | Wt 156.0 lb

## 2023-06-24 DIAGNOSIS — K529 Noninfective gastroenteritis and colitis, unspecified: Secondary | ICD-10-CM | POA: Diagnosis not present

## 2023-06-24 NOTE — Progress Notes (Signed)
Subjective:    Patient ID: [name], female    DOB: 07/01/1961, 62 y.o.   MRN: [id number]  HPI Here to follow up on 2 ED visits, one on 06-10-23, and the other on 06-21-23. About 3 weeks ago she began to have mild episodes of nausea and abdominal cramps and diarrhea. She then went on a cruise to the [place], and these symptoms got worse. She developed bad cramps, she vomited up greenish fluids and she had watery greenish colored diarrhea. She never had a fever. At the 06-10-23 ED visit she was given IV fluids, and she ws sent home with Zofran. Her WBC count that day was normal at 7.6, and her renal function was normal. She was sent home, but the symptoms continued unabated, so she returned on 06-21-23. That day her liver enzymes were normal, a GI panel was negative, and a C diff assay was negative. She was diagnosed with IBS, and she was given Dicyclomine. Her symptoms are much better now, but she does not yet feel back to normal. Her appetite is poor even though the nausea is gone. She still has some cramps and some diarrhea. She was referred to GI, and she is scheduled to see them on 06-27-23.    Review of Systems  Constitutional:  Positive for fatigue. Negative for chills, diaphoresis and fever.  Respiratory: Negative.    Cardiovascular: Negative.   Gastrointestinal:  Positive for abdominal pain. Negative for abdominal distention, blood in stool, diarrhea, nausea and vomiting.  Genitourinary: Negative.        Objective:   Physical Exam Constitutional:      Appearance: Normal appearance. She is not ill-appearing.  Cardiovascular:     Rate and Rhythm: Normal rate and regular rhythm.     Pulses: Normal pulses.     Heart sounds: Normal heart sounds.  Pulmonary:     Effort: Pulmonary effort is normal.     Breath sounds: Normal breath sounds.  Abdominal:     General: Abdomen is flat. Bowel sounds are normal. There is no distension.     Palpations: Abdomen is soft. There is no mass.      Tenderness: There is no right CVA tenderness, left CVA tenderness, guarding or rebound.     Hernia: No hernia is present.  Neurological:     Mental Status: She is alert.           Assessment & Plan:  Her cramps and diarrhea seems to be slowly resolving, but I told her to keep the appt with GI. She may need a colonoscopy to assess for colitis, etc. We spent a total of ( 34  ) minutes reviewing records and discussing these issues.  Gershon Crane, MD

## 2023-06-25 NOTE — Progress Notes (Unsigned)
Chief Complaint: Primary GI MD: Dr. Rhea Belton  HPI: 62 year old female history of diabetes, fibroids, IBS,, asthma, OSA presents for evaluation of  Last seen in 2022 by Midge Minium for chronic constipation.  Was put on Linzess 145.  Had tried Amitiza and MiraLAX in the past without results.  Recently seen in emergency department 06/10/2023 and 06/21/2023 for diarrhea x 2 weeks.  She was diagnosed with gastroenteritis at the first visit.  At the second visit stool studies were completed and GI pathogen panel and C. difficile was negative.  CMP and CBC unrevealing, imaging was done.  She was having diarrhea and lower abdominal cramping.  Patient states her diarrhea has improved and she is back to constipation.  She states she is no longer on Linzess and does MiraLAX almost every day.  States sometimes she will go 2 weeks without a bowel movement.  States she has been constipated pretty much her whole life but is seems to be getting progressively worse.  Denies melena/hematochezia.  Denies weight loss.  PREVIOUS GI WORKUP   Colonoscopy 01/2015 for screening: Normal, repeat 10 years  Past Medical History:  Diagnosis Date   Allergy    Dr Stevphen [place]   Cardiac arrhythmia    Depression    Diabetes mellitus without complication [facility])    Fibroids    uterine sees Dr. Neva Seat in [place]    Genital warts    Heart murmur    High cholesterol    Hypertension    Irritable bowel syndrome (IBS)    Low back pain    has a herniated lumbar disc sees Dr August Saucer    Past Surgical History:  Procedure Laterality Date   ABDOMINOPLASTY  04/2013   BREAST BIOPSY Right 06/2014   COLONOSCOPY  02/10/2015   per Dr. Rhea Belton, clear, repeat in 10 yrs    LUMBAR LAMINECTOMY  2010   L3-4 on 02-24-10 per Dr Otelia Sergeant    MYOMECTOMY     laser ablation of uterine fibroids per Dr Dareen Piano 2000   TUBAL LIGATION      Current Outpatient Medications  Medication Sig Dispense Refill   albuterol (VENTOLIN HFA) 108 (90  Base) MCG/ACT inhaler Inhale 2 puffs into the lungs every 4 (four) hours as needed for wheezing or shortness of breath. 18 g 11   amLODipine (NORVASC) 10 MG tablet Take 1 tablet (10 mg total) by mouth daily. 90 tablet 3   atorvastatin (LIPITOR) 20 MG tablet Take 1 tablet (20 mg total) by mouth daily. 90 tablet 3   blood glucose meter kit and supplies Dispense based on patient and insurance preference. Test once a day. E11.9 1 each 0   cholecalciferol (VITAMIN D) 1000 UNITS tablet Take 1,000 Units by mouth daily.      dicyclomine (BENTYL) 20 MG tablet Take 1 tablet (20 mg total) by mouth 2 (two) times daily. 20 tablet 0   lisinopril (ZESTRIL) 20 MG tablet Take 1 tablet (20 mg total) by mouth 2 (two) times daily. 180 tablet 3   metFORMIN (GLUCOPHAGE) 500 MG tablet Take 1 tablet (500 mg total) by mouth 2 (two) times daily with a meal. 180 tablet 3   Multiple Vitamins-Minerals (MULTIVITAMIN WITH MINERALS) tablet Take 1 tablet by mouth daily.     ondansetron (ZOFRAN) 8 MG tablet TAKE ONE TABLET BY MOUTH EVERY 6 HOURS AS NEEDED FOR NAUSEA/VOMITING 60 tablet 3   ondansetron (ZOFRAN-ODT) 4 MG disintegrating tablet 4mg  ODT q4 hours prn nausea/vomit 10 tablet 0   potassium  chloride (KLOR-CON M) 10 MEQ tablet Take 2 tablets (20 mEq total) by mouth daily. 180 tablet 3   Semaglutide, 1 MG/DOSE, 4 MG/3ML SOPN Inject 1 mg into the skin once a week. 6 mL 5   valACYclovir (VALTREX) 500 MG tablet TAKE ONE TABLET BY MOUTH TWICE A DAY AS NEEDED FOR OUTBREAKS 30 tablet 4   Vitamin D, Ergocalciferol, (DRISDOL) 1.25 MG (50000 UNIT) CAPS capsule Take 1 capsule (50,000 Units total) by mouth every 7 (seven) days. 12 capsule 3   No current facility-administered medications for this visit.    Allergies as of 06/27/2023 - Review Complete 06/24/2023  Allergen Reaction Noted   Iodinated contrast media Swelling 01/06/2019   Iodine Swelling 01/06/2019   Peanut-containing drug products Anaphylaxis 01/30/2011   Hctz  [hydrochlorothiazide] Other (See Comments) 01/30/2011   Shellfish-derived products Other (See Comments) 01/30/2011   Cephalexin Rash 03/14/2018   Ciprofloxacin Diarrhea 05/18/2011    Family History  Problem Relation Age of Onset   Diabetes Mother    Hypertension Mother    Diabetes Father    Hypertension Father    Heart disease Father    Breast cancer Sister    Diabetes Maternal Grandmother    Hypertension Maternal Grandmother    Diabetes Maternal Grandfather    Hypertension Maternal Grandfather    Diabetes Paternal Grandmother    Hypertension Paternal Grandmother    Diabetes Paternal Grandfather    Hypertension Paternal Grandfather    Arthritis Other        Family Hx   Diabetes Other        Family Hx 1st degree relative   Hyperlipidemia Other        Family Hx   Hypertension Other        Family Hx   Prostate cancer Other        Family Hx 1st Degree relative <50   Stroke Other        First Degree Female <50   Colon cancer Neg Hx    Esophageal cancer Neg Hx    Pancreatic cancer Neg Hx    Stomach cancer Neg Hx    Liver disease Neg Hx     Social History   Socioeconomic History   Marital status: Married    Spouse name: Not on file   Number of children: 4   Years of education: Not on file   Highest education level: Some college, no degree  Occupational History   Occupation: Runner, broadcasting/film/video  Tobacco Use   Smoking status: Former    Packs/day: 0.50    Years: 1.00    Additional pack years: 0.00    Total pack years: 0.50    Types: Cigarettes    Quit date: 12/17/1996    Years since quitting: 26.5   Smokeless tobacco: Never  Vaping Use   Vaping Use: Never used  Substance and Sexual Activity   Alcohol use: Yes    Alcohol/week: 0.0 standard drinks of alcohol    Comment: occ   Drug use: No   Sexual activity: Yes    Birth control/protection: Surgical  Other Topics Concern   Not on file  Social History Narrative   Not on file   Social Determinants of Health   Financial  Resource Strain: Low Risk  (08/08/2021)   Overall Financial Resource Strain (CARDIA)    Difficulty of Paying Living Expenses: Not hard at all  Food Insecurity: No Food Insecurity (08/08/2021)   Hunger Vital Sign    Worried About Running Out of  Food in the Last Year: Never true    Ran Out of Food in the Last Year: Never true  Transportation Needs: No Transportation Needs (08/08/2021)   PRAPARE - Administrator, Civil Service (Medical): No    Lack of Transportation (Non-Medical): No  Physical Activity: Sufficiently Active (08/08/2021)   Exercise Vital Sign    Days of Exercise per Week: 6 days    Minutes of Exercise per Session: 30 min  Stress: Stress Concern Present (08/08/2021)   [employer] of Occupational Health - Occupational Stress Questionnaire    Feeling of Stress : To some extent  Social Connections: Socially Integrated (08/08/2021)   Social Connection and Isolation Panel [NHANES]    Frequency of Communication with Friends and Family: More than three times a week    Frequency of Social Gatherings with Friends and Family: Once a week    Attends Religious Services: More than 4 times per year    Active Member of [employer] or Organizations: Yes    Attends Engineer, structural: More than 4 times per year    Marital Status: Married  Catering manager Violence: Not on file    Review of Systems:    Constitutional: No weight loss, fever, chills, weakness or fatigue HEENT: Eyes: No change in vision               Ears, Nose, Throat:  No change in hearing or congestion Skin: No rash or itching Cardiovascular: No chest pain, chest pressure or palpitations   Respiratory: No SOB or cough Gastrointestinal: See HPI and otherwise negative Genitourinary: No dysuria or change in urinary frequency Neurological: No headache, dizziness or syncope Musculoskeletal: No new muscle or joint pain Hematologic: No bleeding or bruising Psychiatric: No history of depression or  anxiety    Physical Exam:  Vital signs: LMP 10/18/2015   Constitutional: NAD, Well developed, Well nourished, alert and cooperative Head:  Normocephalic and atraumatic. Eyes:   PEERL, EOMI. No icterus. Conjunctiva pink. Respiratory: Respirations even and unlabored. Lungs clear to auscultation bilaterally.   No wheezes, crackles, or rhonchi.  Cardiovascular:  Regular rate and rhythm. No peripheral edema, cyanosis or pallor.  Gastrointestinal:  Soft, nondistended, nontender. No rebound or guarding. Normal bowel sounds. No appreciable masses or hepatomegaly. Rectal:  Not performed.  Msk:  Symmetrical without gross deformities. Without edema, no deformity or joint abnormality.  Neurologic:  Alert and  oriented x4;  grossly normal neurologically.  Skin:   Dry and intact without significant lesions or rashes. Psychiatric: Oriented to person, place and time. Demonstrates good judgement and reason without abnormal affect or behaviors.   RELEVANT LABS AND IMAGING: CBC    Component Value Date/Time   WBC 7.6 06/10/2023 1050   RBC 4.74 06/10/2023 1050   HGB 13.8 06/10/2023 1050   HCT 41.6 06/10/2023 1050   PLT 350 06/10/2023 1050   MCV 87.8 06/10/2023 1050   MCH 29.1 06/10/2023 1050   MCHC 33.2 06/10/2023 1050   RDW 13.2 06/10/2023 1050   LYMPHSABS 2.0 06/10/2023 1050   MONOABS 0.7 06/10/2023 1050   EOSABS 0.2 06/10/2023 1050   BASOSABS 0.0 06/10/2023 1050    CMP     Component Value Date/Time   NA 140 06/10/2023 1050   K 3.6 06/10/2023 1050   CL 110 06/10/2023 1050   CO2 23 06/10/2023 1050   GLUCOSE 88 06/10/2023 1050   BUN 15 06/10/2023 1050   CREATININE 0.86 06/10/2023 1050   CALCIUM 9.0 06/10/2023 1050  PROT 7.0 06/10/2023 1050   ALBUMIN 4.0 06/10/2023 1050   AST 21 06/10/2023 1050   ALT 25 06/10/2023 1050   ALKPHOS 110 06/10/2023 1050   BILITOT 0.9 06/10/2023 1050   GFRNONAA >60 06/10/2023 1050   GFRAA >60 11/30/2019 2358    Assessment: 1.  Constipation 2.   Abdominal pain -Patient has tried Amitiza, Linzess, and MiraLAX with minimal relief.  Appears her episode of diarrhea was likely due to worsening constipation as it improved with some MiraLAX and she is back to her baseline.  Suspect lower abdominal pain is secondary to her constipation.  Patient's last colonoscopy was 2016 and she states her constipation has been getting progressively worse so I think it is reasonable to get her colonoscopy earlier for evaluation of possible obstructive polyp.   Plan: - Schedule colonoscopy - I thoroughly discussed the procedure with the patient (at bedside) to include nature of the procedure, alternatives, benefits, and risks (including but not limited to bleeding, infection, perforation, anesthesia/cardiac pulmonary complications).  Patient verbalized understanding and gave verbal consent to proceed with colonoscopy.  -Trial of Trulance 3 Mg p.o. daily -increase water, increase fiber, increase exercise -Squatty potty -Follow-up 3 months  Kandiss Ihrig Jolee Ewing [facility] 06/25/2023, 12:12 PM  Cc: Nelwyn Salisbury, MD

## 2023-06-27 ENCOUNTER — Telehealth: Payer: Self-pay

## 2023-06-27 ENCOUNTER — Ambulatory Visit: Payer: BC Managed Care – PPO | Admitting: Gastroenterology

## 2023-06-27 ENCOUNTER — Encounter: Payer: Self-pay | Admitting: Gastroenterology

## 2023-06-27 VITALS — BP 96/68 | HR 62 | Ht 63.0 in | Wt 161.0 lb

## 2023-06-27 DIAGNOSIS — R1030 Lower abdominal pain, unspecified: Secondary | ICD-10-CM

## 2023-06-27 DIAGNOSIS — K5909 Other constipation: Secondary | ICD-10-CM

## 2023-06-27 MED ORDER — NA SULFATE-K SULFATE-MG SULF 17.5-3.13-1.6 GM/177ML PO SOLN: 1.0000 | Freq: Once | ORAL | 0 refills | Status: AC

## 2023-06-27 MED ORDER — TRULANCE 3 MG PO TABS: 3.0000 mg | ORAL_TABLET | Freq: Every day | ORAL | 3 refills | Status: DC

## 2023-06-27 NOTE — Telephone Encounter (Signed)
PA has been submitted and is pending determination, will be updated in additional encounter created. 

## 2023-06-27 NOTE — Telephone Encounter (Signed)
Please initiate PA for Trulance 3 MG for this patient please. Thank You!

## 2023-06-27 NOTE — Telephone Encounter (Signed)
*  Gastro  PA request received for Trulance 3MG  tablets  PA submitted to [employer] via CMM and is pending additional questions/determination  Key: [id number]

## 2023-06-27 NOTE — Patient Instructions (Addendum)
You have been scheduled for an appointment with [name] on 09/23/23 at 900 AM . Please arrive 10 minutes early for your appointment.   Stop Miralax while on Trulance  We have sent the following medications to your pharmacy for you to pick up at your convenience: Trulance 3 MG- Take 1 Tablet daily.  You have been scheduled for a colonoscopy. Please follow written instructions given to you at your visit today.   Please pick up your prep supplies at the pharmacy within the next 1-3 days.  If you use inhalers (even only as needed), please bring them with you on the day of your procedure.  DO NOT TAKE 7 DAYS PRIOR TO TEST- Trulicity (dulaglutide) Ozempic, Wegovy (semaglutide) Mounjaro (tirzepatide) Bydureon Bcise (exanatide extended release)  DO NOT TAKE 1 DAY PRIOR TO YOUR TEST Rybelsus (semaglutide) Adlyxin (lixisenatide) Victoza (liraglutide) Byetta (exanatide) ___________________________________________________________________________  _______________________________________________________  If your blood pressure at your visit was 140/90 or greater, please contact your primary care physician to follow up on this.  _______________________________________________________  If you are age 21 or older, your body mass index should be between 23-30. Your Body mass index is 28.52 kg/m. If this is out of the aforementioned range listed, please consider follow up with your Primary Care Provider.  If you are age 63 or younger, your body mass index should be between 19-25. Your Body mass index is 28.52 kg/m. If this is out of the aformentioned range listed, please consider follow up with your Primary Care Provider.   __________________________________________________________  The [facility] providers would like to encourage you to use [facility] to communicate with providers for non-urgent requests or questions.  Due to long hold times on the telephone, sending your provider a message  by [facility] may be a faster and more efficient way to get a response.  Please allow 48 business hours for a response.  Please remember that this is for non-urgent requests.   Due to recent changes in healthcare laws, you may see the results of your imaging and laboratory studies on MyChart before your provider has had a chance to review them.  We understand that in some cases there may be results that are confusing or concerning to you. Not all laboratory results come back in the same time frame and the provider may be waiting for multiple results in order to interpret others.  Please give [place] 48 hours in order for your provider to thoroughly review all the results before contacting the office for clarification of your results.

## 2023-07-02 NOTE — Telephone Encounter (Signed)
PA has been APPROVED from 06/27/2023-06/25/2026

## 2023-07-02 NOTE — Progress Notes (Signed)
 Addendum: Reviewed and agree with assessment and management plan. Pyrtle, Jay M, MD  

## 2023-07-10 ENCOUNTER — Encounter: Payer: Self-pay | Admitting: Internal Medicine

## 2023-07-16 ENCOUNTER — Encounter: Payer: Self-pay | Admitting: Family Medicine

## 2023-07-17 ENCOUNTER — Telehealth: Payer: Self-pay

## 2023-07-17 ENCOUNTER — Encounter: Payer: Self-pay | Admitting: Family Medicine

## 2023-07-17 ENCOUNTER — Encounter: Payer: Self-pay | Admitting: Internal Medicine

## 2023-07-17 ENCOUNTER — Other Ambulatory Visit: Payer: Self-pay

## 2023-07-17 ENCOUNTER — Ambulatory Visit (AMBULATORY_SURGERY_CENTER): Payer: BC Managed Care – PPO | Admitting: Internal Medicine

## 2023-07-17 VITALS — BP 109/58 | HR 52 | Temp 97.7°F | Resp 13 | Ht 63.0 in | Wt 161.0 lb

## 2023-07-17 DIAGNOSIS — D123 Benign neoplasm of transverse colon: Secondary | ICD-10-CM

## 2023-07-17 DIAGNOSIS — K5909 Other constipation: Secondary | ICD-10-CM | POA: Diagnosis not present

## 2023-07-17 DIAGNOSIS — R1084 Generalized abdominal pain: Secondary | ICD-10-CM | POA: Diagnosis not present

## 2023-07-17 DIAGNOSIS — M5416 Radiculopathy, lumbar region: Secondary | ICD-10-CM

## 2023-07-17 MED ORDER — DIPHENHYDRAMINE HCL 50 MG PO TABS: 50.0000 mg | ORAL_TABLET | Freq: Once | ORAL | 0 refills | Status: DC

## 2023-07-17 MED ORDER — PREDNISONE 50 MG PO TABS: ORAL_TABLET | ORAL | 0 refills | Status: DC

## 2023-07-17 MED ORDER — SODIUM CHLORIDE 0.9 % IV SOLN: 500.0000 mL | Freq: Once | INTRAVENOUS | Status: DC

## 2023-07-17 NOTE — Progress Notes (Signed)
Called to room to assist during endoscopic procedure.  Patient ID and intended procedure confirmed with present staff. Received instructions for my participation in the procedure from the performing physician.  

## 2023-07-17 NOTE — Progress Notes (Signed)
VS by Amparo Bristol.

## 2023-07-17 NOTE — Op Note (Signed)
[facility] Patient Name: [name] Procedure Date: 07/17/2023 1:48 PM MRN: [id number] Endoscopist: Beverley Fiedler , MD, [id number] Age: 62 Referring MD:  Date of Birth: 1961/10/19 Gender: Female Account #: [id number] Procedure:                Colonoscopy Indications:              Generalized abdominal pain, constipation Medicines:                Monitored Anesthesia Care Procedure:                Pre-Anesthesia Assessment:                           - Prior to the procedure, a History and Physical                            was performed, and patient medications and                            allergies were reviewed. The patient's tolerance of                            previous anesthesia was also reviewed. The risks                            and benefits of the procedure and the sedation                            options and risks were discussed with the patient.                            All questions were answered, and informed consent                            was obtained. Prior Anticoagulants: The patient has                            taken no anticoagulant or antiplatelet agents. ASA                            Grade Assessment: II - A patient with mild systemic                            disease. After reviewing the risks and benefits,                            the patient was deemed in satisfactory condition to                            undergo the procedure.                           After obtaining informed consent, the colonoscope  was passed under direct vision. Throughout the                            procedure, the patient's blood pressure, pulse, and                            oxygen saturations were monitored continuously. The                            CF [id number] #[id number] was introduced through the anus                            and advanced to the cecum, identified by                            appendiceal orifice and  ileocecal valve. The                            colonoscopy was performed without difficulty. The                            patient tolerated the procedure well. The quality                            of the bowel preparation was good. The ileocecal                            valve, appendiceal orifice, and rectum were                            photographed. Scope In: 2:02:13 PM Scope Out: 2:17:31 PM Scope Withdrawal Time: 0 hours 11 minutes 39 seconds  Total Procedure Duration: 0 hours 15 minutes 18 seconds  Findings:                 The digital rectal exam was normal.                           A 3 mm polyp was found in the transverse colon. The                            polyp was sessile. The polyp was removed with a                            cold snare. Resection and retrieval were complete.                           Internal hemorrhoids were found during                            retroflexion. The hemorrhoids were small.                           The exam was otherwise without abnormality. Complications:  No immediate complications. Estimated Blood Loss:     Estimated blood loss: none. Impression:               - One 3 mm polyp in the transverse colon, removed                            with a cold snare. Resected and retrieved.                           - Small internal hemorrhoids.                           - The examination was otherwise normal. Recommendation:           - Patient has a contact number available for                            emergencies. The signs and symptoms of potential                            delayed complications were discussed with the                            patient. Return to normal activities tomorrow.                            Written discharge instructions were provided to the                            patient.                           - Resume previous diet.                           - Continue present medications. Continue  Trulance 3                            mg daily. If persistent constipation symptoms on                            this medication please call for further                            recommendations.                           - Await pathology results.                           - Repeat colonoscopy is recommended. The                            colonoscopy date will be determined after pathology                            results from  today's exam become available for                            review. New recall replaces old recall now that                            colonoscopy repeated. Beverley Fiedler, MD 07/17/2023 2:22:39 PM This report has been signed electronically.

## 2023-07-17 NOTE — Progress Notes (Signed)
See office note dated 06/27/2023 for details and current H&P Patient presenting for repeat colonoscopy to evaluate abdominal pain and constipation   She remains appropriate for [facility] colonoscopy today

## 2023-07-17 NOTE — Telephone Encounter (Signed)
Phone call to patient to review instructions for 13 hr prep for Steroid Inj. w/ contrast on 07/19/23  at 12:30. Prescription called into [facility]. Pt aware and verbalized understanding of instructions. Prescription: Pt to take 50 mg of prednisone on 07/18/23 at 11:30P.M, 50 mg of prednisone on 07/19/23 at 5:30A.M and 50 mg of prednisone on 07/19/23 at 11:30A.M. Pt is also to take 50 mg of benadryl on 07/19/23 at 11:30A.M. Please call [phone] with any questions.

## 2023-07-17 NOTE — Progress Notes (Signed)
Report to PACU, RN, vss, BBS= Clear.  

## 2023-07-17 NOTE — Patient Instructions (Signed)
Please read handouts provided. Continue present medications. Continue Trulance 3 mg daily. If persistent constipation symptoms on this medication please call for further recommendation. Await pathology results.   YOU HAD AN ENDOSCOPIC PROCEDURE TODAY AT THE [facility]:   Refer to the procedure report that was given to you for any specific questions about what was found during the examination.  If the procedure report does not answer your questions, please call your gastroenterologist to clarify.  If you requested that your care partner not be given the details of your procedure findings, then the procedure report has been included in a sealed envelope for you to review at your convenience later.  YOU SHOULD EXPECT: Some feelings of bloating in the abdomen. Passage of more gas than usual.  Walking can help get rid of the air that was put into your GI tract during the procedure and reduce the bloating. If you had a lower endoscopy (such as a colonoscopy or flexible sigmoidoscopy) you may notice spotting of blood in your stool or on the toilet paper. If you underwent a bowel prep for your procedure, you may not have a normal bowel movement for a few days.  Please Note:  You might notice some irritation and congestion in your nose or some drainage.  This is from the oxygen used during your procedure.  There is no need for concern and it should clear up in a day or so.  SYMPTOMS TO REPORT IMMEDIATELY:  Following lower endoscopy (colonoscopy or flexible sigmoidoscopy):  Excessive amounts of blood in the stool  Significant tenderness or worsening of abdominal pains  Swelling of the abdomen that is new, acute  Fever of 100F or higher  For urgent or emergent issues, a gastroenterologist can be reached at any hour by calling [phone]. Do not use MyChart messaging for urgent concerns.    DIET:  We do recommend a small meal at first, but then you may proceed to your regular diet.   Drink plenty of fluids but you should avoid alcoholic beverages for 24 hours.  ACTIVITY:  You should plan to take it easy for the rest of today and you should NOT DRIVE or use heavy machinery until tomorrow (because of the sedation medicines used during the test).    FOLLOW UP: Our staff will call the number listed on your records the next business day following your procedure.  We will call around 7:15- 8:00 am to check on you and address any questions or concerns that you may have regarding the information given to you following your procedure. If we do not reach you, we will leave a message.     If any biopsies were taken you will be contacted by phone or by letter within the next 1-3 weeks.  Please call us at [phone] if you have not heard about the biopsies in 3 weeks.    SIGNATURES/CONFIDENTIALITY: You and/or your care partner have signed paperwork which will be entered into your electronic medical record.  These signatures attest to the fact that that the information above on your After Visit Summary has been reviewed and is understood.  Full responsibility of the confidentiality of this discharge information lies with you and/or your care-partner.

## 2023-07-18 ENCOUNTER — Other Ambulatory Visit: Payer: Self-pay | Admitting: Family Medicine

## 2023-07-18 NOTE — Discharge Instructions (Signed)

## 2023-07-19 ENCOUNTER — Telehealth: Payer: Self-pay | Admitting: *Deleted

## 2023-07-19 ENCOUNTER — Ambulatory Visit
Admission: RE | Admit: 2023-07-19 | Discharge: 2023-07-19 | Disposition: A | Discharge status: Home / Self Care | Payer: BC Managed Care – PPO | Source: Ambulatory Visit | Attending: Family Medicine | Admitting: Family Medicine

## 2023-07-19 DIAGNOSIS — M5416 Radiculopathy, lumbar region: Secondary | ICD-10-CM

## 2023-07-19 MED ORDER — IOPAMIDOL (ISOVUE-M 200) INJECTION 41%
1.0000 mL | Freq: Once | INTRAMUSCULAR | Status: AC
  Administered 2023-07-19: 1 mL via EPIDURAL

## 2023-07-19 MED ORDER — METHYLPREDNISOLONE ACETATE 40 MG/ML INJ SUSP (RADIOLOG
80.0000 mg | Freq: Once | INTRAMUSCULAR | Status: AC
  Administered 2023-07-19: 80 mg via EPIDURAL

## 2023-07-19 NOTE — Telephone Encounter (Signed)
  Follow up Call-     07/17/2023    1:17 PM  Call back number  Post procedure Call Back phone  # [phone]  Permission to leave phone message Yes     Patient questions:  Do you have a fever, pain , or abdominal swelling? No. Pain Score  0 *  Have you tolerated food without any problems? Yes.    Have you been able to return to your normal activities? Yes.    Do you have any questions about your discharge instructions: Diet   No. Medications  No. Follow up visit  No.  Do you have questions or concerns about your Care? No.  Actions: * If pain score is 4 or above: No action needed, pain <4.

## 2023-07-25 ENCOUNTER — Encounter: Payer: Self-pay | Admitting: Internal Medicine

## 2023-07-25 ENCOUNTER — Ambulatory Visit: Payer: BC Managed Care – PPO | Admitting: Family Medicine

## 2023-08-15 NOTE — Progress Notes (Signed)
[facility] [address] [place] [address] Phone: [phone] Subjective:   [name], am serving as a scribe for Dr. Antoine Primas.  I'm seeing this patient by the request  of:  Nelwyn Salisbury, MD  CC: Left hip does have some tenderness to palpation noted.  [id number]  10/09/2022 Injection given today.  Chronic problem with worsening symptoms.  Failed all other conservative therapy.  The patient has been attempting to lose weight and encouraged her to continue to be active.  Discussed over-the-counter orthotics and proper shoes that I think will be beneficial.  Discussed posture and ergonomics also that can be helpful.  Patient given injection today and hopefully will do well and follow-up with me again in 8 weeks   Updated 08/22/2023 [name] is a 62 y.o. female coming in with complaint of pain in legs. Epidural 07/19/2023 that did not provide her with any relief. Patient states that when she lies down or is seated she will develop pain in lateral aspect of L leg. Pain primarily on L side but does get pain in R side about once a month. Unable to sleep due to pain.        Past Medical History:  Diagnosis Date   Allergy    Dr Stevphen [place]   Cardiac arrhythmia    Depression    Diabetes mellitus without complication [facility])    Fibroids    uterine sees Dr. Neva Seat in [place]    Genital warts    Heart murmur    High cholesterol    Hypertension    Irritable bowel syndrome (IBS)    Low back pain    has a herniated lumbar disc sees Dr August Saucer   Past Surgical History:  Procedure Laterality Date   ABDOMINOPLASTY  04/2013   BREAST BIOPSY Right 06/2014   COLONOSCOPY  02/10/2015   per Dr. Rhea Belton, clear, repeat in 10 yrs    LUMBAR LAMINECTOMY  2010   L3-4 on 02-24-10 per Dr Otelia Sergeant    MYOMECTOMY     laser ablation of uterine fibroids per Dr Dareen Piano 2000   TUBAL LIGATION     Social History   Socioeconomic History   Marital  status: Married    Spouse name: Not on file   Number of children: 4   Years of education: Not on file   Highest education level: Some college, no degree  Occupational History   Occupation: Runner, broadcasting/film/video  Tobacco Use   Smoking status: Former    Current packs/day: 0.00    Average packs/day: 0.5 packs/day for 1 year (0.5 ttl pk-yrs)    Types: Cigarettes    Start date: 12/18/1995    Quit date: 12/17/1996    Years since quitting: 26.6   Smokeless tobacco: Never  Vaping Use   Vaping status: Never Used  Substance and Sexual Activity   Alcohol use: Yes    Alcohol/week: 0.0 standard drinks of alcohol    Comment: occ   Drug use: No   Sexual activity: Yes    Birth control/protection: Surgical  Other Topics Concern   Not on file  Social History Narrative   Not on file   Social Determinants of Health   Financial Resource Strain: Low Risk  (08/08/2021)   Overall Financial Resource Strain (CARDIA)    Difficulty of Paying Living Expenses: Not hard at all  Food Insecurity: No Food Insecurity (08/08/2021)   Hunger Vital Sign    Worried About Running Out  of Food in the Last Year: Never true    Ran Out of Food in the Last Year: Never true  Transportation Needs: No Transportation Needs (08/08/2021)   PRAPARE - Administrator, Civil Service (Medical): No    Lack of Transportation (Non-Medical): No  Physical Activity: Sufficiently Active (08/08/2021)   Exercise Vital Sign    Days of Exercise per Week: 6 days    Minutes of Exercise per Session: 30 min  Stress: Stress Concern Present (08/08/2021)   [employer] of Occupational Health - Occupational Stress Questionnaire    Feeling of Stress : To some extent  Social Connections: Socially Integrated (08/08/2021)   Social Connection and Isolation Panel [NHANES]    Frequency of Communication with Friends and Family: More than three times a week    Frequency of Social Gatherings with Friends and Family: Once a week    Attends Religious  Services: More than 4 times per year    Active Member of [employer] or Organizations: Yes    Attends Engineer, structural: More than 4 times per year    Marital Status: Married   Allergies  Allergen Reactions   Iodinated Contrast Media Swelling    Tongue swelling   Iodine Swelling    Tongue swelling   Peanut-Containing Drug Products Anaphylaxis   Hctz [Hydrochlorothiazide] Other (See Comments)    Blood pressure goes up   [employer] Other (See Comments)     Per allergy test   Cephalexin Rash   Ciprofloxacin Diarrhea   Family History  Problem Relation Age of Onset   Diabetes Mother    Hypertension Mother    Diabetes Father    Hypertension Father    Heart disease Father    Breast cancer Sister    Diabetes Maternal Grandmother    Hypertension Maternal Grandmother    Diabetes Maternal Grandfather    Hypertension Maternal Grandfather    Diabetes Paternal Grandmother    Hypertension Paternal Grandmother    Diabetes Paternal Grandfather    Hypertension Paternal Grandfather    Arthritis Other        Family Hx   Diabetes Other        Family Hx 1st degree relative   Hyperlipidemia Other        Family Hx   Hypertension Other        Family Hx   Prostate cancer Other        Family Hx 1st Degree relative <50   Stroke Other        First Degree Female <50   Colon cancer Neg Hx    Esophageal cancer Neg Hx    Pancreatic cancer Neg Hx    Stomach cancer Neg Hx    Liver disease Neg Hx    Rectal cancer Neg Hx     Current Outpatient Medications (Endocrine & Metabolic):    metFORMIN (GLUCOPHAGE) 500 MG tablet, Take 1 tablet (500 mg total) by mouth 2 (two) times daily with a meal.   Semaglutide, 1 MG/DOSE, 4 MG/3ML SOPN, Inject 1 mg into the skin once a week.  Current Outpatient Medications (Cardiovascular):    amLODipine (NORVASC) 10 MG tablet, Take 1 tablet (10 mg total) by mouth daily.   atorvastatin (LIPITOR) 20 MG tablet, Take 1 tablet (20 mg total) by mouth  daily.   lisinopril (ZESTRIL) 20 MG tablet, Take 1 tablet (20 mg total) by mouth 2 (two) times daily.  Current Outpatient Medications (Respiratory):    albuterol (VENTOLIN HFA)  108 (90 Base) MCG/ACT inhaler, Inhale 2 puffs into the lungs every 4 (four) hours as needed for wheezing or shortness of breath.    Current Outpatient Medications (Other):    blood glucose meter kit and supplies, Dispense based on patient and insurance preference. Test once a day. E11.9   dicyclomine (BENTYL) 20 MG tablet, Take 1 tablet (20 mg total) by mouth 2 (two) times daily.   ergocalciferol (VITAMIN D2) 1.25 MG (50000 UT) capsule, Take 50,000 Units by mouth once a week.   Multiple Vitamins-Minerals (MULTIVITAMIN WITH MINERALS) tablet, Take 1 tablet by mouth daily.   ondansetron (ZOFRAN) 8 MG tablet, TAKE ONE TABLET BY MOUTH EVERY 6 HOURS AS NEEDED FOR NAUSEA/VOMITING   ondansetron (ZOFRAN-ODT) 4 MG disintegrating tablet, 4mg  ODT q4 hours prn nausea/vomit   Plecanatide (TRULANCE) 3 MG TABS, Take 1 tablet (3 mg total) by mouth daily.   potassium chloride (KLOR-CON M) 10 MEQ tablet, TAKE TWO TABLETS BY MOUTH DAILY   valACYclovir (VALTREX) 500 MG tablet, TAKE ONE TABLET BY MOUTH TWICE A DAY AS NEEDED FOR OUTBREAKS   Reviewed prior external information including notes and imaging from  primary care provider As well as notes that were available from care everywhere and other healthcare systems.  Past medical history, social, surgical and family history all reviewed in electronic medical record.  No pertanent information unless stated regarding to the chief complaint.   Review of Systems:  No headache, visual changes, nausea, vomiting, diarrhea, constipation, dizziness, abdominal pain, skin rash, fevers, chills, night sweats, weight loss, swollen lymph nodes, body aches, joint swelling, chest pain, shortness of breath, mood changes. POSITIVE muscle aches  Objective  Blood pressure 126/84, pulse 72, height 5\' 3"   (1.6 m), weight 157 lb (71.2 kg), last menstrual period 10/18/2015, SpO2 98%.   General: No apparent distress alert and oriented x3 mood and affect normal, dressed appropriately.  HEENT: Pupils equal, extraocular movements intact  Respiratory: Patient's speak in full sentences and does not appear short of breath  Cardiovascular: No lower extremity edema, non tender, no erythema  Tender to palpation over the greater trochanteric area more than anywhere else.  Patient's back does have some mild pain over the sacroiliac joint.   After verbal consent patient was prepped with alcohol swab and with a 21-gauge 2 inch needle injected into the left greater trochanteric area with 2 cc of 0.5% Marcaine and 1 cc of Kenalog 40 mg/mL.  No blood loss.  Band-Aid placed.  Postinjection instructions given    Impression and Recommendations:     The above documentation has been reviewed and is accurate and complete Judi Saa, DO

## 2023-08-22 ENCOUNTER — Ambulatory Visit (INDEPENDENT_AMBULATORY_CARE_PROVIDER_SITE_OTHER): Payer: BC Managed Care – PPO | Admitting: Family Medicine

## 2023-08-22 ENCOUNTER — Ambulatory Visit (INDEPENDENT_AMBULATORY_CARE_PROVIDER_SITE_OTHER): Payer: BC Managed Care – PPO

## 2023-08-22 ENCOUNTER — Encounter: Payer: Self-pay | Admitting: Family Medicine

## 2023-08-22 VITALS — BP 126/84 | HR 72 | Ht 63.0 in | Wt 157.0 lb

## 2023-08-22 DIAGNOSIS — M5416 Radiculopathy, lumbar region: Secondary | ICD-10-CM

## 2023-08-22 DIAGNOSIS — M76892 Other specified enthesopathies of left lower limb, excluding foot: Secondary | ICD-10-CM | POA: Diagnosis not present

## 2023-08-22 DIAGNOSIS — M25552 Pain in left hip: Secondary | ICD-10-CM

## 2023-08-22 NOTE — Patient Instructions (Addendum)
GT injection Xray today Send a message in 2 weeks if not better we will get an MRI See you again in 2 months

## 2023-08-22 NOTE — Assessment & Plan Note (Signed)
Problem with worsening symptoms, did not respond previously to this point we will try an injection with it being greater than 4 years ago.  Patient now has had difficulty with her back and I am concerned that with patient not responding as much to the epidurals that further evaluation with a new MRI with her being greater than 60 years old would be necessary.  We will monitor to see how much radicular symptoms there is otherwise.  Discussed icing regimen and home exercises.  Discussed gabapentin and other medications that have been prescribed previously.  Follow-up again in 6 to 8 weeks

## 2023-08-26 ENCOUNTER — Ambulatory Visit: Payer: BC Managed Care – PPO | Admitting: Family Medicine

## 2023-08-26 ENCOUNTER — Encounter: Payer: Self-pay | Admitting: Family Medicine

## 2023-08-26 VITALS — BP 120/78 | HR 65 | Temp 98.1°F | Wt 156.0 lb

## 2023-08-26 DIAGNOSIS — Z7985 Long-term (current) use of injectable non-insulin antidiabetic drugs: Secondary | ICD-10-CM

## 2023-08-26 DIAGNOSIS — E559 Vitamin D deficiency, unspecified: Secondary | ICD-10-CM | POA: Insufficient documentation

## 2023-08-26 DIAGNOSIS — E119 Type 2 diabetes mellitus without complications: Secondary | ICD-10-CM | POA: Diagnosis not present

## 2023-08-26 LAB — VITAMIN D 25 HYDROXY (VIT D DEFICIENCY, FRACTURES): VITD: 52.85 ng/mL (ref 30.00–100.00)

## 2023-08-26 LAB — HEMOGLOBIN A1C: Hgb A1c MFr Bld: 5.7 % (ref 4.6–6.5)

## 2023-08-26 MED ORDER — SEMAGLUTIDE (2 MG/DOSE) 8 MG/3ML ~~LOC~~ SOPN: 2.0000 mg | PEN_INJECTOR | SUBCUTANEOUS | 3 refills | Status: DC

## 2023-08-26 NOTE — Progress Notes (Signed)
Subjective:    Patient ID: [name], female    DOB: 1960/12/25, 62 y.o.   MRN: [id number]  HPI Here to follow up on type 2 diabetes and vitamin D deficiency. She has lost about 8 lbs using Semaglutide, and she feels well. She has been taking Vitamin D capsules for the past 12 weeks.    Review of Systems  Constitutional: Negative.   Respiratory: Negative.    Cardiovascular: Negative.        Objective:   Physical Exam Constitutional:      Appearance: Normal appearance.  Cardiovascular:     Rate and Rhythm: Normal rate and regular rhythm.     Pulses: Normal pulses.     Heart sounds: Normal heart sounds.  Pulmonary:     Effort: Pulmonary effort is normal.     Breath sounds: Normal breath sounds.  Neurological:     Mental Status: She is alert.           Assessment & Plan:  She is doing well. We will check an A1c and a vitamin D level today. Increase the Semaglutide to 2 mg weekly.  Gershon Crane, MD

## 2023-08-31 ENCOUNTER — Encounter: Payer: Self-pay | Admitting: Family Medicine

## 2023-09-04 ENCOUNTER — Telehealth: Payer: Self-pay

## 2023-09-04 ENCOUNTER — Other Ambulatory Visit (HOSPITAL_COMMUNITY): Payer: Self-pay

## 2023-09-04 NOTE — Telephone Encounter (Signed)
Pharmacy Patient Advocate Encounter   Received notification from Physician's Office that prior authorization for [facility] 2MG   is required/requested.   Insurance verification completed.   The patient is insured through [facility]   Per test claim: PA required; PA submitted to [facility] via CoverMyMeds Key/confirmation #/EOC Key: [id number]  Status is pending

## 2023-09-04 NOTE — Telephone Encounter (Signed)
Pt called to ask that we disregard last message. Pharmacy has been faxing the dosage change, and still no response from [place]. Pharmacy is currently faxing [place] that info. I will place in eFax folder, when it arrives.

## 2023-09-05 ENCOUNTER — Other Ambulatory Visit: Payer: Self-pay

## 2023-09-05 ENCOUNTER — Other Ambulatory Visit (HOSPITAL_COMMUNITY): Payer: Self-pay

## 2023-09-05 DIAGNOSIS — M25552 Pain in left hip: Secondary | ICD-10-CM

## 2023-09-05 DIAGNOSIS — M545 Low back pain, unspecified: Secondary | ICD-10-CM

## 2023-09-05 NOTE — Telephone Encounter (Signed)
Our office has not received any request regarding pt medication. We are waiting for the fax

## 2023-09-09 ENCOUNTER — Other Ambulatory Visit (HOSPITAL_COMMUNITY): Payer: Self-pay

## 2023-09-09 NOTE — Telephone Encounter (Signed)
Pharmacy Patient Advocate Encounter  Received notification from [facility] that Prior Authorization for Ozempic (2 MG/DOSE) 8MG /3ML pen-injectors has been APPROVED from 9.18.24 to 9.18.27. Ran test claim, and the RX has last been filled on 09.19.24 and is not yet due .   This test claim was processed through [facility]- copay amounts may vary at other pharmacies due to pharmacy/plan contracts, or as the patient moves through the different stages of their insurance plan.   PA #/Case ID/Reference #: Key: [id number]

## 2023-09-09 NOTE — Telephone Encounter (Signed)
Pt notified via My Chart

## 2023-09-19 ENCOUNTER — Ambulatory Visit
Admission: RE | Admit: 2023-09-19 | Discharge: 2023-09-19 | Disposition: A | Discharge status: Home / Self Care | Payer: BC Managed Care – PPO | Source: Ambulatory Visit | Attending: Family Medicine | Admitting: Family Medicine

## 2023-09-19 DIAGNOSIS — M545 Low back pain, unspecified: Secondary | ICD-10-CM

## 2023-09-19 DIAGNOSIS — M25552 Pain in left hip: Secondary | ICD-10-CM

## 2023-09-23 ENCOUNTER — Ambulatory Visit: Payer: BC Managed Care – PPO | Admitting: Gastroenterology

## 2023-09-28 ENCOUNTER — Other Ambulatory Visit: Payer: BC Managed Care – PPO

## 2023-10-02 ENCOUNTER — Encounter: Payer: Self-pay | Admitting: Family Medicine

## 2023-10-02 ENCOUNTER — Ambulatory Visit: Payer: BC Managed Care – PPO | Admitting: Family Medicine

## 2023-10-02 VITALS — BP 100/78 | HR 59 | Temp 98.3°F | Wt 152.0 lb

## 2023-10-02 DIAGNOSIS — Z7985 Long-term (current) use of injectable non-insulin antidiabetic drugs: Secondary | ICD-10-CM | POA: Diagnosis not present

## 2023-10-02 DIAGNOSIS — D170 Benign lipomatous neoplasm of skin and subcutaneous tissue of head, face and neck: Secondary | ICD-10-CM | POA: Diagnosis not present

## 2023-10-02 DIAGNOSIS — E119 Type 2 diabetes mellitus without complications: Secondary | ICD-10-CM | POA: Diagnosis not present

## 2023-10-02 NOTE — Progress Notes (Signed)
Subjective:    Patient ID: [name], female    DOB: [birth year], 62 y.o.   MRN: [id number]  HPI Here for 2 issues. First she noticed a lump on the back of her neck about a month ago. This gets mildly painful at times. No recent trauma. Ibuprofen helps a little. The other issue is her diabetes. She had been taking Metformin for several years with reasonable diabetes control. Then we started her on Semaglutide shots, and her glucoses came down some more. Then a few weeks ago she started to see her glucose drop to the 60's at times and she felt shaky. She has been losing some weight as well. She decided to stop taking the Metformin about 4 weeks ago. Now her glucoses are more stable. Her am fasting readings are now in the range of 90-100. She asks if was okay to stop the Metformin.    Review of Systems  Constitutional: Negative.   Respiratory: Negative.    Cardiovascular: Negative.        Objective:   Physical Exam Constitutional:      Appearance: Normal appearance. She is not ill-appearing.  Cardiovascular:     Rate and Rhythm: Normal rate and regular rhythm.     Pulses: Normal pulses.     Heart sounds: Normal heart sounds.  Pulmonary:     Effort: Pulmonary effort is normal.     Breath sounds: Normal breath sounds.  Musculoskeletal:     Comments: She has a non-tender firm mobile lump just under the skin on her posterior neck. ROM is full   Neurological:     Mental Status: She is alert.           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 I explained that this is benign, and hopefully it will settle back down. In addition to Ibuprofen she may apply Voltaren gel QID as needed. As for the diabetes, I agree with her stopping the Metformin and staying on the Semaglutide alone. We will follow up in 4 weeks.  Gershon Crane, MD

## 2023-10-08 ENCOUNTER — Encounter: Payer: Self-pay | Admitting: Family Medicine

## 2023-10-09 ENCOUNTER — Telehealth: Payer: Self-pay

## 2023-10-09 ENCOUNTER — Other Ambulatory Visit: Payer: Self-pay

## 2023-10-09 DIAGNOSIS — M5416 Radiculopathy, lumbar region: Secondary | ICD-10-CM

## 2023-10-09 MED ORDER — PREDNISONE 50 MG PO TABS: ORAL_TABLET | ORAL | 0 refills | Status: DC

## 2023-10-09 NOTE — Telephone Encounter (Signed)
Phone call to patient to review instructions for 13 hr prep for Lumbar Epidural w/ contrast on 10/15/23 at 7:30AM. Prescription called into [facility]. Pt aware and verbalized understanding of instructions. Prescription: Pt to take 50 mg of prednisone on 10/14/23 at 6:30PM, 50 mg of prednisone on 10/15/23 at 12:30AM, and 50 mg of prednisone on 10/15/23 at 6:30AM. Pt is also to take 50 mg of benadryl on 10/15/23 at 6:30AM. Please call [phone] with any questions.

## 2023-10-14 NOTE — Discharge Instructions (Signed)

## 2023-10-15 ENCOUNTER — Ambulatory Visit
Admission: RE | Admit: 2023-10-15 | Discharge: 2023-10-15 | Disposition: A | Discharge status: Home / Self Care | Payer: BC Managed Care – PPO | Source: Ambulatory Visit | Attending: Family Medicine | Admitting: Family Medicine

## 2023-10-15 DIAGNOSIS — M5416 Radiculopathy, lumbar region: Secondary | ICD-10-CM

## 2023-10-15 MED ORDER — METHYLPREDNISOLONE ACETATE 40 MG/ML INJ SUSP (RADIOLOG
80.0000 mg | Freq: Once | INTRAMUSCULAR | Status: AC
  Administered 2023-10-15: 80 mg via EPIDURAL

## 2023-10-15 MED ORDER — IOPAMIDOL (ISOVUE-M 200) INJECTION 41%
1.0000 mL | Freq: Once | INTRAMUSCULAR | Status: AC
  Administered 2023-10-15: 1 mL via EPIDURAL

## 2023-10-24 ENCOUNTER — Ambulatory Visit: Payer: BC Managed Care – PPO | Admitting: Family Medicine

## 2023-10-30 ENCOUNTER — Other Ambulatory Visit: Payer: Self-pay | Admitting: Gastroenterology

## 2023-11-20 ENCOUNTER — Encounter: Payer: Self-pay | Admitting: Family Medicine

## 2023-11-20 ENCOUNTER — Other Ambulatory Visit: Payer: Self-pay

## 2023-11-20 DIAGNOSIS — M5416 Radiculopathy, lumbar region: Secondary | ICD-10-CM

## 2023-11-27 ENCOUNTER — Emergency Department (HOSPITAL_COMMUNITY)
Admission: EM | Admit: 2023-11-27 | Discharge: 2023-11-27 | Disposition: A | Discharge status: Home / Self Care | Payer: BC Managed Care – PPO | Attending: Emergency Medicine | Admitting: Emergency Medicine

## 2023-11-27 ENCOUNTER — Other Ambulatory Visit: Payer: Self-pay

## 2023-11-27 ENCOUNTER — Emergency Department (HOSPITAL_COMMUNITY): Payer: BC Managed Care – PPO

## 2023-11-27 ENCOUNTER — Encounter (HOSPITAL_COMMUNITY): Payer: Self-pay

## 2023-11-27 ENCOUNTER — Telehealth: Payer: Self-pay

## 2023-11-27 DIAGNOSIS — Z91010 Allergy to peanuts: Secondary | ICD-10-CM | POA: Diagnosis not present

## 2023-11-27 DIAGNOSIS — R0789 Other chest pain: Secondary | ICD-10-CM | POA: Diagnosis present

## 2023-11-27 DIAGNOSIS — K2100 Gastro-esophageal reflux disease with esophagitis, without bleeding: Secondary | ICD-10-CM | POA: Diagnosis not present

## 2023-11-27 DIAGNOSIS — I10 Essential (primary) hypertension: Secondary | ICD-10-CM | POA: Diagnosis not present

## 2023-11-27 DIAGNOSIS — Z1152 Encounter for screening for COVID-19: Secondary | ICD-10-CM | POA: Insufficient documentation

## 2023-11-27 DIAGNOSIS — Z79899 Other long term (current) drug therapy: Secondary | ICD-10-CM | POA: Diagnosis not present

## 2023-11-27 DIAGNOSIS — B349 Viral infection, unspecified: Secondary | ICD-10-CM | POA: Insufficient documentation

## 2023-11-27 LAB — BASIC METABOLIC PANEL
Anion gap: 8 (ref 5–15)
BUN: 13 mg/dL (ref 6–20)
CO2: 25 mmol/L (ref 22–32)
Calcium: 9.6 mg/dL (ref 8.9–10.3)
Chloride: 108 mmol/L (ref 98–111)
Creatinine, Ser: 0.83 mg/dL (ref 0.44–1.00)
GFR, Estimated: >60 mL/min (ref >60)
Glucose, Bld: 83 mg/dL (ref 70–99)
Potassium: 3.8 mmol/L (ref 3.5–5.1)
Sodium: 141 mmol/L (ref 135–145)

## 2023-11-27 LAB — CBC
HCT: 41.4 % (ref 36.0–46.0)
Hemoglobin: 14.2 g/dL (ref 12.0–15.0)
MCH: 30.7 pg (ref 26.0–34.0)
MCHC: 34.3 g/dL (ref 30.0–36.0)
MCV: 89.4 fL (ref 80.0–100.0)
Platelets: 371 10*3/uL (ref 150–400)
RBC: 4.63 MIL/uL (ref 3.87–5.11)
RDW: 13.2 % (ref 11.5–15.5)
WBC: 8.3 10*3/uL (ref 4.0–10.5)
nRBC: 0 % (ref 0.0–0.2)

## 2023-11-27 LAB — RESP PANEL BY RT-PCR (RSV, FLU A&B, COVID)  RVPGX2
Influenza A by PCR: NEGATIVE
Influenza B by PCR: NEGATIVE
Resp Syncytial Virus by PCR: NEGATIVE
SARS Coronavirus 2 by RT PCR: NEGATIVE

## 2023-11-27 LAB — TROPONIN I (HIGH SENSITIVITY): Troponin I (High Sensitivity): 2 ng/L (ref <18)

## 2023-11-27 MED ORDER — PREDNISONE 50 MG PO TABS: ORAL_TABLET | ORAL | 0 refills | Status: DC

## 2023-11-27 NOTE — Discharge Instructions (Addendum)
Please follow-up on the results of your COVID and flu testing on your patient portal later this afternoon.

## 2023-11-27 NOTE — ED Provider Notes (Signed)
[place] EMERGENCY DEPARTMENT AT [facility] Provider Note   CSN: [id number] Arrival date & time: 11/27/23  1227     History  Chief Complaint  Patient presents with   Weakness   Chest Pain    [name] is a 62 y.o. female w/ hx of HTN, HLD presenting to the ED with 4 days of worsening congestion, headache, muscle aches, paresthesias, chest pain.  Patient where she has a mild cough as well.  Poor appetite.  Loose bowel movements yesterday.  Denies sick contacts in the house.  She did have some chest pressure earlier today, she describes a burning sensation and pressure in the middle of her chest.  She does have reflux gastritis issues  HPI     Home Medications Prior to Admission medications   Medication Sig Start Date End Date Taking? Authorizing Provider  albuterol (VENTOLIN HFA) 108 (90 Base) MCG/ACT inhaler Inhale 2 puffs into the lungs every 4 (four) hours as needed for wheezing or shortness of breath. 05/09/23   Nelwyn Salisbury, MD  amLODipine (NORVASC) 10 MG tablet Take 1 tablet (10 mg total) by mouth daily. 11/23/22   Burchette, Elberta Fortis, MD  atorvastatin (LIPITOR) 20 MG tablet Take 1 tablet (20 mg total) by mouth daily. 05/09/23   Nelwyn Salisbury, MD  blood glucose meter kit and supplies Dispense based on patient and insurance preference. Test once a day. E11.9 05/04/20   Nelwyn Salisbury, MD  dicyclomine (BENTYL) 20 MG tablet Take 1 tablet (20 mg total) by mouth 2 (two) times daily. 06/21/23   Glyn Ade, MD  ergocalciferol (VITAMIN D2) 1.25 MG (50000 UT) capsule Take 50,000 Units by mouth once a week.    [provider]  lisinopril (ZESTRIL) 20 MG tablet Take 1 tablet (20 mg total) by mouth 2 (two) times daily. 05/09/23   Nelwyn Salisbury, MD  metFORMIN (GLUCOPHAGE) 500 MG tablet Take 1 tablet (500 mg total) by mouth 2 (two) times daily with a meal. Patient not taking: Reported on 10/02/2023 05/09/23   Nelwyn Salisbury, MD  Multiple Vitamins-Minerals  (MULTIVITAMIN WITH MINERALS) tablet Take 1 tablet by mouth daily.    [provider]  ondansetron (ZOFRAN) 8 MG tablet TAKE ONE TABLET BY MOUTH EVERY 6 HOURS AS NEEDED FOR NAUSEA/VOMITING 10/31/21   Nelwyn Salisbury, MD  ondansetron (ZOFRAN-ODT) 4 MG disintegrating tablet 4mg  ODT q4 hours prn nausea/vomit 06/10/23   Bethann Berkshire, MD  potassium chloride (KLOR-CON M) 10 MEQ tablet TAKE TWO TABLETS BY MOUTH DAILY 07/22/23   Nelwyn Salisbury, MD  predniSONE (DELTASONE) 50 MG tablet Pt to take 50 mg of prednisone on 10/14/23 at 6:30PM, 50 mg of prednisone on 10/15/23 at 12:30AM, and 50 mg of prednisone on 10/15/23 at 6:30AM. Pt is also to take 50 mg of benadryl on 10/15/23 at 6:30AM. Please call [phone] with any questions. 10/09/23   Oley Balm, MD  predniSONE (DELTASONE) 50 MG tablet Pt to take 50 mg of prednisone on 12/02/23 at 1:00AM, 50 mg of prednisone on 12/02/23 at 7:00AM, and 50 mg of prednisone on 12/02/23 at 1:00PM. Pt is also to take 50 mg of benadryl on 12/02/23 at 1:00PM. Please call [phone] with any questions. 11/27/23   Marin Roberts, MD  Semaglutide, 2 MG/DOSE, 8 MG/3ML SOPN Inject 2 mg into the skin once a week. 08/26/23   Nelwyn Salisbury, MD  TRULANCE 3 MG TABS TAKE 1 TABLET BY MOUTH DAILY 11/04/23   McMichael,  Bayley M, PA-C  valACYclovir (VALTREX) 500 MG tablet TAKE ONE TABLET BY MOUTH TWICE A DAY AS NEEDED FOR OUTBREAKS 12/28/19   Nelwyn Salisbury, MD      Allergies    Iodinated contrast media, Iodine, Peanut-containing drug products, Hctz [hydrochlorothiazide], Shellfish-derived products, Cephalexin, Ciprofloxacin, and Fentanyl    Review of Systems   Review of Systems  Physical Exam Updated Vital Signs BP (!) 130/96 (BP Location: Left Arm)   Pulse 66   Temp 98.8 F (37.1 C) (Oral)   Resp 16   Ht 5' 3.25" (1.607 m)   Wt 67.6 kg   LMP 10/18/2015   SpO2 99%   BMI 26.19 kg/m  Physical Exam Constitutional:      General: She is not in acute  distress. HENT:     Head: Normocephalic and atraumatic.  Eyes:     Conjunctiva/sclera: Conjunctivae normal.     Pupils: Pupils are equal, round, and reactive to light.  Cardiovascular:     Rate and Rhythm: Normal rate and regular rhythm.  Pulmonary:     Effort: Pulmonary effort is normal. No respiratory distress.  Abdominal:     General: There is no distension.     Tenderness: There is no abdominal tenderness.  Skin:    General: Skin is warm and dry.  Neurological:     General: No focal deficit present.     Mental Status: She is alert. Mental status is at baseline.  Psychiatric:        Mood and Affect: Mood normal.        Behavior: Behavior normal.     ED Results / Procedures / Treatments   Labs (all labs ordered are listed, but only abnormal results are displayed) Labs Reviewed  RESP PANEL BY RT-PCR (RSV, FLU A&B, COVID)  [id number]  BASIC METABOLIC PANEL  CBC  TROPONIN I (HIGH SENSITIVITY)    EKG EKG Interpretation Date/Time:  Wednesday November 27 2023 12:39:16 EST Ventricular Rate:  66 PR Interval:  149 QRS Duration:  94 QT Interval:  407 QTC Calculation: 427 R Axis:   93  Text Interpretation: Sinus arrhythmia Right axis deviation Confirmed by Alvester Chou [phone]) on 11/27/2023 3:04:20 PM  Radiology DG Chest 2 View  Result Date: 11/27/2023 CLINICAL DATA:  Chest pain, cough and fever for a week EXAM: CHEST - 2 VIEW COMPARISON:  Chest x-ray 01/05/2022. FINDINGS: The heart size and mediastinal contours are within normal limits. No consolidation, pneumothorax or effusion. No edema. The visualized skeletal structures are unremarkable. Slight eventration of the left hemidiaphragm. IMPRESSION: No acute cardiopulmonary disease. Electronically Signed   By: Karen Kays M.D.   On: 11/27/2023 14:48    Procedures Procedures    Medications Ordered in ED Medications - No data to display  ED Course/ Medical Decision Making/ A&P                                  Medical Decision Making Amount and/or Complexity of Data Reviewed Labs: ordered. Radiology: ordered.   Patient is presenting with constellation of symptoms ongoing for days that are strongly suggestive of viral syndrome.  Less likely bacterial infection or atypical ACS.  I personally viewed the patient's labs, EKG, chest x-ray and workup, with no emergent findings.  No evidence of pneumonia, ACS.  No evidence of acute anemia.  Electrolytes within normal limits.  EKG per my interpretation was sinus rhythm at acute ischemic findings.  There is no indication for hospitalization at this time.  I recommended continued supportive care at home with over-the-counter medications.  We will perform a COVID and flu test prior to leaving and she can follow-up on the results at home.  She is not a candidate for antiviral therapy at this point.  She and her husband both verbalized understanding and agreement with this plan.        Final Clinical Impression(s) / ED Diagnoses Final diagnoses:  Viral illness  Gastroesophageal reflux disease with esophagitis without hemorrhage    Rx / DC Orders ED Discharge Orders     None         Terald Sleeper, MD 11/27/23 1530

## 2023-11-27 NOTE — Telephone Encounter (Signed)
Phone call to patient to review instructions for 13 hr prep for Epidural Injection w/ contrast on 12/02/23 at 2:00PM. Prescription called into [facility]. Pt aware and verbalized understanding of instructions. Prescription: 12/02/23 @1 :00AM- 50mg  Prednisone 12/02/23 @7 :00AM- 50mg  Prednisone 12/02/23 @1 :00PM - 50mg  Prednisone and 50mg  Benadryl

## 2023-11-27 NOTE — ED Triage Notes (Signed)
Chest pain that radiated to the right side and weakness PTA at work.  Runny nose and cold sx for a week.

## 2023-11-29 NOTE — Discharge Instructions (Signed)

## 2023-12-02 ENCOUNTER — Ambulatory Visit
Admission: RE | Admit: 2023-12-02 | Discharge: 2023-12-02 | Disposition: A | Discharge status: Home / Self Care | Payer: BC Managed Care – PPO | Source: Ambulatory Visit | Attending: Family Medicine | Admitting: Family Medicine

## 2023-12-02 ENCOUNTER — Other Ambulatory Visit: Payer: Self-pay | Admitting: Family Medicine

## 2023-12-02 DIAGNOSIS — M5416 Radiculopathy, lumbar region: Secondary | ICD-10-CM

## 2023-12-02 MED ORDER — METHYLPREDNISOLONE ACETATE 40 MG/ML INJ SUSP (RADIOLOG
80.0000 mg | Freq: Once | INTRAMUSCULAR | Status: AC
  Administered 2023-12-02: 80 mg via INTRA_ARTICULAR

## 2023-12-02 MED ORDER — IOPAMIDOL (ISOVUE-M 200) INJECTION 41%
1.0000 mL | Freq: Once | INTRAMUSCULAR | Status: AC
  Administered 2023-12-02: 1 mL via INTRA_ARTICULAR

## 2023-12-03 ENCOUNTER — Ambulatory Visit: Payer: BC Managed Care – PPO | Admitting: Family Medicine

## 2023-12-16 ENCOUNTER — Ambulatory Visit: Payer: BC Managed Care – PPO | Admitting: Family Medicine

## 2023-12-16 VITALS — BP 110/70 | HR 66 | Temp 98.2°F | Wt 149.0 lb

## 2023-12-16 DIAGNOSIS — E119 Type 2 diabetes mellitus without complications: Secondary | ICD-10-CM

## 2023-12-16 DIAGNOSIS — Z7985 Long-term (current) use of injectable non-insulin antidiabetic drugs: Secondary | ICD-10-CM | POA: Diagnosis not present

## 2023-12-16 DIAGNOSIS — R599 Enlarged lymph nodes, unspecified: Secondary | ICD-10-CM

## 2023-12-16 LAB — POCT GLYCOSYLATED HEMOGLOBIN (HGB A1C): Hemoglobin A1C: 5.5 % — NL (ref 4.0–5.6)

## 2023-12-16 NOTE — Progress Notes (Signed)
   Subjective:    Patient ID: [name], female    DOB: 08-04-1961, 62 y.o.   MRN: [id number]  HPI Here for several issues. First she wants to check an A1c. She has been using Semaglutide injections, and she has stopped taking Metformin. Since April she has lost 18 pounds, and she feels great. Her A1c today is 5.5%. The other issue is the lump on her right posterior neck. This came up a little over a year ago, and we felt this was a cervical lymph node. Since then it seems to grow larger at times and then gets smaller at times, but it never goes away. She is very concerned about this. It is not painful.    Review of Systems  Constitutional: Negative.   HENT:  Negative for congestion, ear pain, mouth sores, postnasal drip, sinus pressure, sore throat and trouble swallowing.   Eyes: Negative.   Respiratory: Negative.    Cardiovascular: Negative.        Objective:   Physical Exam Constitutional:      Appearance: Normal appearance.  HENT:     Right Ear: Tympanic membrane, ear canal and external ear normal.     Left Ear: Tympanic membrane, ear canal and external ear normal.     Nose: Nose normal.     Mouth/Throat:     Pharynx: Oropharynx is clear.  Eyes:     Conjunctiva/sclera: Conjunctivae normal.  Neck:     Comments: There is a single 1.5 cm mobile round non-tender lump on the right posterior neck consistent with a lymph node.  Neurological:     Mental Status: She is alert.           Assessment & Plan:  She is doing well with her diabetes control and with weight management. She will continue with the Semaglutide injections at the current dose. Recheck an A1c with her well exam in May 2025. As for the neck lump, we will send her to ENT for a biopsy.  Gershon Crane, MD

## 2024-01-09 DIAGNOSIS — Z0279 Encounter for issue of other medical certificate: Secondary | ICD-10-CM

## 2024-01-14 ENCOUNTER — Other Ambulatory Visit: Payer: Self-pay | Admitting: Family Medicine

## 2024-01-16 ENCOUNTER — Ambulatory Visit (INDEPENDENT_AMBULATORY_CARE_PROVIDER_SITE_OTHER): Payer: 59 | Admitting: Otolaryngology

## 2024-01-16 ENCOUNTER — Encounter (INDEPENDENT_AMBULATORY_CARE_PROVIDER_SITE_OTHER): Payer: Self-pay | Admitting: Otolaryngology

## 2024-01-16 VITALS — BP 128/81 | HR 85 | Ht 63.0 in | Wt 154.0 lb

## 2024-01-16 DIAGNOSIS — R590 Localized enlarged lymph nodes: Secondary | ICD-10-CM

## 2024-01-16 DIAGNOSIS — R221 Localized swelling, mass and lump, neck: Secondary | ICD-10-CM

## 2024-01-16 NOTE — Progress Notes (Signed)
ENT CONSULT:  Reason for Consult: posterior cervical lymphadenopathy   HPI: Discussed the use of AI scribe software for clinical note transcription with the patient, who gave verbal consent to proceed.  History of Present Illness   The patient presents with a right-sided posterior neck mass. They were referred by their primary care doctor for evaluation of a neck mass.  The right-sided neck mass has been present for over a year. Initially, it increased in size, then decreased slightly, but overall the size remained stable. It was sore and occasionally painful at some point, but currently, there is no pain. No associated symptoms such as rash, fevers, chills, or night sweats. They have intentionally lost weight due to being on Ozempic. There is no personal history of skin cancer or any other type of cancer. She denies abnormal labs or lumps anywhere else.   Recent lab work done about a month ago including CBC w/diff were normal.      Records Reviewed:  ED visit 11/27/23 - seen for UR sx and GERD sx   PCP office visit Dr Clent Ridges 10/02/23 Here for 2 issues. First she noticed a lump on the back of her neck about a month ago. This gets mildly painful at times. No recent trauma. Ibuprofen helps a little. The other issue is her diabetes. She had been taking Metformin for several years with reasonable diabetes control. Then we started her on Semaglutide shots, and her glucoses came down some more. Then a few weeks ago she started to see her glucose drop to the 60's at times and she felt shaky. She has been losing some weight as well. She decided to stop taking the Metformin about 4 weeks ago. Now her glucoses are more stable. Her am fasting readings are now in the range of 90-100. She asks if was okay to stop the Metformin.   The lump on her neck is a lipoma which has become inflamed. I explained that this is benign, and hopefully it will settle back down. In addition to Ibuprofen she may apply Voltaren gel  QID as needed. As for the diabetes, I agree with her stopping the Metformin and staying on the Semaglutide alone. We will follow up in 4 weeks.    Past Medical History:  Diagnosis Date   Allergy    Dr Stevphen [place]   Cardiac arrhythmia    Depression    Diabetes mellitus without complication [facility])    Fibroids    uterine sees Dr. Neva Seat in [place]    Genital warts    Heart murmur    High cholesterol    Hypertension    Irritable bowel syndrome (IBS)    Low back pain    has a herniated lumbar disc sees Dr August Saucer    Past Surgical History:  Procedure Laterality Date   ABDOMINOPLASTY  04/2013   BREAST BIOPSY Right 06/2014   COLONOSCOPY  02/10/2015   per Dr. Rhea Belton, clear, repeat in 10 yrs    LUMBAR LAMINECTOMY  2010   L3-4 on 02-24-10 per Dr Otelia Sergeant    MYOMECTOMY     laser ablation of uterine fibroids per Dr Dareen Piano 2000   TUBAL LIGATION      Family History  Problem Relation Age of Onset   Diabetes Mother    Hypertension Mother    Diabetes Father    Hypertension Father    Heart disease Father    Breast cancer Sister    Diabetes Maternal Grandmother    Hypertension Maternal Grandmother  Diabetes Maternal Grandfather    Hypertension Maternal Grandfather    Diabetes Paternal Grandmother    Hypertension Paternal Grandmother    Diabetes Paternal Grandfather    Hypertension Paternal Grandfather    Arthritis Other        Family Hx   Diabetes Other        Family Hx 1st degree relative   Hyperlipidemia Other        Family Hx   Hypertension Other        Family Hx   Prostate cancer Other        Family Hx 1st Degree relative <50   Stroke Other        First Degree Female <50   Colon cancer Neg Hx    Esophageal cancer Neg Hx    Pancreatic cancer Neg Hx    Stomach cancer Neg Hx    Liver disease Neg Hx    Rectal cancer Neg Hx     Social History:  reports that she quit smoking about 27 years ago. Her smoking use included cigarettes. She started smoking about 28 years ago.  She has a 0.5 pack-year smoking history. She has never used smokeless tobacco. She reports current alcohol use. She reports that she does not use drugs.  Allergies:  Allergies  Allergen Reactions   Iodinated Contrast Media Swelling    Tongue swelling   Iodine Swelling    Tongue swelling   Peanut-Containing Drug Products Anaphylaxis   Hctz [Hydrochlorothiazide] Other (See Comments)    Blood pressure goes up   Shellfish-Derived Products Other (See Comments)     Per allergy test   Cephalexin Rash   Ciprofloxacin Diarrhea   Fentanyl Itching    Medications: I have reviewed the patient's current medications.  The PMH, PSH, Medications, Allergies, and SH were reviewed and updated.  ROS: Constitutional: Negative for fever, weight loss and weight gain. Cardiovascular: Negative for chest pain and dyspnea on exertion. Respiratory: Is not experiencing shortness of breath at rest. Gastrointestinal: Negative for nausea and vomiting. Neurological: Negative for headaches. Psychiatric: The patient is not nervous/anxious  Blood pressure 128/81, pulse 85, height 5\' 3"  (1.6 m), weight 154 lb (69.9 kg), last menstrual period 10/18/2015, SpO2 95%.  PHYSICAL EXAM:  Exam: General: Well-developed, well-nourished Respiratory Respiratory effort: Equal inspiration and expiration without stridor Cardiovascular Peripheral Vascular: Warm extremities with equal color/perfusion Eyes: No nystagmus with equal extraocular motion bilaterally Neuro/Psych/Balance: Patient oriented to person, place, and time; Appropriate mood and affect; Gait is intact with no imbalance; Cranial nerves I-XII are intact Head and Face Inspection: Normocephalic and atraumatic without mass or lesion Palpation: Facial skeleton intact without bony stepoffs Salivary Glands: No mass or tenderness Facial Strength: Facial motility symmetric and full bilaterally ENT Pinna: External ear intact and fully developed External canal: Canal  is patent with intact skin Tympanic Membrane: Clear and mobile External Nose: No scar or anatomic deformity Internal Nose: Septum is relatively straight no anterior rhinoscopy. No polyp, or purulence. Mucosal edema and erythema present.  Bilateral inferior turbinate hypertrophy.  Lips, Teeth, and gums: Mucosa and teeth intact and viable TMJ: No pain to palpation with full mobility Oral cavity/oropharynx: No erythema or exudate, no lesions present Neck Neck and Trachea: Midline trachea without mass or lesion Thyroid: No mass or nodularity Lymphatics: No lymphadenopathy Posterior neck/shoulder area with a palpable node vs mass 2-3 cm in size soft and mobile no overlying skin changes   Procedure: none  Studies Reviewed:MR cervical spine  Chronic neck pain  with degenerative changes on x-ray. Right shoulder/arm pain.   EXAM: MRI CERVICAL SPINE WITHOUT CONTRAST   TECHNIQUE: Multiplanar, multisequence MR imaging of the cervical spine was performed. No intravenous contrast was administered.   COMPARISON:  Cervical spine CT 05/09/2018   FINDINGS: Alignment: Normal   Vertebrae: No fracture, evidence of discitis, or bone lesion.   Cord: Normal signal and morphology.   Posterior Fossa, vertebral arteries, paraspinal tissues: Simple appearing intramuscular lipoma in the right paraspinal neck measuring up to 2 cm in length.   Disc levels:   C2-3: Mild disc narrowing and desiccation   C3-4: Mild disc narrowing and desiccation.  Mild disc bulging   C4-5: Mild degenerative facet spurring.  Mild disc bulging.   C5-6: Disc narrowing and endplate degeneration with endplate and uncovertebral ridging. Negative facets. Patent canal and foramina.   C6-7: Disc narrowing and endplate degeneration with left paracentral to foraminal protrusion best seen on sagittal images. The canal and foramina remain patent.   C7-T1:Unremarkable.   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 No impingement to explain right shoulder symptoms.  Assessment/Plan: Encounter Diagnoses  Name Primary?   Cervical lymphadenopathy Yes   Neck mass     Assessment and Plan    Right posterior neck mass/node  Presents with a right-sided posterior cervical/shoulder lymph node present for over a year, fluctuating in size but generally without evidence of increase in size. No associated pain or systemic symptoms such as rash, fevers, chills, or unintentional weight loss.  Physical examination reveals a lymph node vs mass on the right side of the neck posteriorly soft and mobile, without overlying skin changes. Differential diagnosis includes reactive lymph node versus lipoma, and pathologic lymph nodes or other concerning diagnoses such as lymphoma are less likely given the lack of systemic symptoms and normal recent lab work, no evidence of lymph nodes anywhere else.   Imaging is the first step to assess the mass and its characteristics. Ultrasound will document size and appearance. Reassured that the lack of new nodes and the long duration without significant growth are positive signs. If the ultrasound shows concerning features, further steps like CT neck and biopsy may be considered. - Order neck ultrasound - Schedule follow-up appointment to discuss ultrasound results.    Thank you for allowing me to participate in the care of this patient. Please do not hesitate to contact me with any questions or concerns.   Ashok Croon, MD Otolaryngology [facility] Phone: [phone] Fax: [phone]    01/16/2024, 4:53 PM

## 2024-01-16 NOTE — Patient Instructions (Signed)
-   schedule neck U/S

## 2024-01-21 ENCOUNTER — Telehealth: Payer: Self-pay | Admitting: Gastroenterology

## 2024-01-21 NOTE — Telephone Encounter (Signed)
 Patient is advised that she will need to contact her insurance company to find out what prescription constipation medications they prefer. I did give her a list of possibilities. Patient says that she has a $1200 deductible and will have to pay $400.00 per month until she meets that deductible for her medications.   Patient has previously tried and failed amitiza , linzess , miralax .  Patient verbalizes understanding that she should reach back out to us  once she finds out insurance preference.

## 2024-01-21 NOTE — Telephone Encounter (Signed)
Inbound call from patient, states Trulance is now not being covered be her insurance. She would like to discuss more affordable options.

## 2024-01-22 ENCOUNTER — Ambulatory Visit
Admission: RE | Admit: 2024-01-22 | Discharge: 2024-01-22 | Discharge status: Home / Self Care | Payer: 59 | Source: Ambulatory Visit | Attending: Otolaryngology | Admitting: Otolaryngology

## 2024-01-22 DIAGNOSIS — R590 Localized enlarged lymph nodes: Secondary | ICD-10-CM

## 2024-01-22 MED ORDER — LINACLOTIDE 72 MCG PO CAPS: 72.0000 ug | ORAL_CAPSULE | Freq: Every day | ORAL | 2 refills | Status: DC

## 2024-01-22 NOTE — Telephone Encounter (Signed)
 Inbound call from patient requesting a call to discuss medications further.  Please advise, thank you.

## 2024-01-22 NOTE — Telephone Encounter (Signed)
 Patient calls back stating that her insurance will provide either Linzess  or Amitiza  at a reasonable cost to her and she would like to switch to one of those medications. Patient is advised that she has tried both of these medications previously without positive results, but we certainly understand cost being a factor, so will send a note to MD to get further recommendations.SABRASABRA

## 2024-01-22 NOTE — Telephone Encounter (Signed)
Rx for Linzess 72 mcg has been sent to [facility]. Patient is advised. She has been asked to reach back out if this is ineffective.

## 2024-01-22 NOTE — Telephone Encounter (Signed)
 Okay to retry Linzess  but would start at low-dose 72 mcg daily and then titrate as tolerated by response

## 2024-01-22 NOTE — Addendum Note (Signed)
Addended by: Richardson Chiquito on: 01/22/2024 03:17 PM   Modules accepted: Orders

## 2024-01-28 ENCOUNTER — Telehealth (INDEPENDENT_AMBULATORY_CARE_PROVIDER_SITE_OTHER): Payer: Self-pay

## 2024-01-28 NOTE — Telephone Encounter (Signed)
-----   Message from [place] sent at 01/24/2024  5:11 PM EST ----- Her neck U/S showed benign appearing lesion likely a lipoma. Could please let her know? thnx ----- Message ----- From: Interface, Rad Results In Sent: 01/24/2024   1:54 PM EST To: Ashok Croon, MD

## 2024-01-28 NOTE — Telephone Encounter (Signed)
Left a message for the patient that her [place] was a benign lesion

## 2024-01-29 ENCOUNTER — Telehealth: Payer: Self-pay | Admitting: Family Medicine

## 2024-01-29 NOTE — Telephone Encounter (Signed)
Patient dropped off document  medical evaluation form , to be filled out by provider. Patient requested to send it back via Call Patient to pick up within 7-days. Document is located in providers tray at front office.Please advise at Mobile [phone] (mobile)

## 2024-02-03 NOTE — Telephone Encounter (Signed)
Pt form received and placed on Dr Fry red folder 

## 2024-02-04 NOTE — Telephone Encounter (Signed)
The form is ready

## 2024-02-06 ENCOUNTER — Ambulatory Visit: Payer: 59 | Admitting: Family Medicine

## 2024-02-06 ENCOUNTER — Other Ambulatory Visit (HOSPITAL_COMMUNITY)
Admission: RE | Admit: 2024-02-06 | Discharge: 2024-02-06 | Disposition: A | Discharge status: Home / Self Care | Payer: 59 | Source: Ambulatory Visit | Attending: Family Medicine | Admitting: Family Medicine

## 2024-02-06 VITALS — BP 130/80 | HR 75 | Temp 98.2°F

## 2024-02-06 DIAGNOSIS — R300 Dysuria: Secondary | ICD-10-CM | POA: Diagnosis not present

## 2024-02-06 DIAGNOSIS — N898 Other specified noninflammatory disorders of vagina: Secondary | ICD-10-CM | POA: Insufficient documentation

## 2024-02-06 DIAGNOSIS — R8290 Unspecified abnormal findings in urine: Secondary | ICD-10-CM | POA: Diagnosis not present

## 2024-02-06 LAB — POC URINALSYSI DIPSTICK (AUTOMATED)
Bilirubin, UA: NEGATIVE
Blood, UA: POSITIVE
Glucose, UA: NEGATIVE
Ketones, UA: NEGATIVE
Nitrite, UA: NEGATIVE
Protein, UA: NEGATIVE
Spec Grav, UA: 1.025 (ref 1.010–1.025)
Urobilinogen, UA: 0.2 U/dL
pH, UA: 6 (ref 5.0–8.0)

## 2024-02-06 MED ORDER — NITROFURANTOIN MONOHYD MACRO 100 MG PO CAPS
100.0000 mg | ORAL_CAPSULE | Freq: Two times a day (BID) | ORAL | 0 refills | Status: AC
Start: 2024-02-06 — End: 2024-02-11

## 2024-02-06 NOTE — Patient Instructions (Signed)
-  It was a pleasure to take care of you today. -Urinalysis showed some abnormalities that could be a urinary tract infection.  -Will start Macrobid 100mg  tablet, take 1 tablet twice a day for 5 days.  -Will start antibiotic for abnormal urinalysis, send urine for culture, and will call with results.  -Vaginal swab completed for other reasons for symptoms. Office will call with results.  -Stay hydrated.

## 2024-02-06 NOTE — Progress Notes (Signed)
Acute Office Visit   Subjective:  Patient ID: [name], female    DOB: 1961-03-23, 63 y.o.   MRN: [id number]  Chief Complaint  Patient presents with   Urinary Tract Infection    HPI Patient is complaining of burning with urinating, vaginal itching, vaginal odor, intermittent right lower abd pain, and vaginal discharge-creamy, thick. These symptoms started a month ago.   Denies fever, lower back pain, no more than usual nausea without vomiting. No visible blood in urine.   Denies taking any medication for symptoms.   ROS See HPI above      Objective:   BP 130/80 (BP Location: Left Arm, Patient Position: Sitting, Cuff Size: Normal)   Pulse 75   Temp 98.2 F (36.8 C) (Oral)   LMP 10/18/2015   SpO2 97%    Physical Exam Vitals reviewed.  Constitutional:      General: She is not in acute distress.    Appearance: Normal appearance. She is not ill-appearing, toxic-appearing or diaphoretic.  HENT:     Head: Normocephalic and atraumatic.  Eyes:     General:        Right eye: No discharge.        Left eye: No discharge.     Conjunctiva/sclera: Conjunctivae normal.  Cardiovascular:     Rate and Rhythm: Normal rate and regular rhythm.     Heart sounds: Normal heart sounds. No murmur heard.    No friction rub. No gallop.  Pulmonary:     Effort: Pulmonary effort is normal. No respiratory distress.     Breath sounds: Normal breath sounds.  Abdominal:     General: Bowel sounds are normal. There is no distension.     Palpations: Abdomen is soft.     Tenderness: There is no abdominal tenderness. There is no right CVA tenderness or left CVA tenderness.  Musculoskeletal:        General: Normal range of motion.  Skin:    General: Skin is warm and dry.  Neurological:     General: No focal deficit present.     Mental Status: She is alert and oriented to person, place, and time. Mental status is at baseline.  Psychiatric:        Mood and Affect: Mood normal.         Behavior: Behavior normal.        Thought Content: Thought content normal.        Judgment: Judgment normal.     Results for orders placed or performed in visit on 02/06/24  POCT Urinalysis Dipstick (Automated)  Result Value Ref Range   Color, UA Yellow    Clarity, UA Clear    Glucose, UA Negative Negative   Bilirubin, UA Negative    Ketones, UA Negative    Spec Grav, UA 1.025 1.010 - 1.025   Blood, UA Positive    pH, UA 6.0 5.0 - 8.0   Protein, UA Negative Negative   Urobilinogen, UA 0.2 0.2 or 1.0 E.U./dL   Nitrite, UA Negative    Leukocytes, UA Small (1+) (A) Negative       Assessment & Plan:  Dysuria -     POCT Urinalysis Dipstick (Automated) -     Urine Culture; Future  Abnormal urinalysis -     Nitrofurantoin Monohyd Macro; Take 1 capsule (100 mg total) by mouth 2 (two) times daily for 5 days.  Dispense: 10 capsule; Refill: 0  Vaginal discharge -     Cervicovaginal ancillary  only; Future  Vaginal itching -     Cervicovaginal ancillary only; Future  -Urinalysis showed some abnormalities that could be a urinary tract infection. + leukocytes and blood.   -Will start Macrobid 100mg  tablet, take 1 tablet twice a day for 5 days.  -Will start antibiotic for abnormal urinalysis, send urine for culture, and will call with results.  -Vaginal swab completed for other reasons for symptoms such as vaginal discharge and odor. Office will call with results and treatment if needed.  -Stay hydrated.  Zandra Abts, NP

## 2024-02-07 LAB — URINE CULTURE
MICRO NUMBER:: 16108816
Result:: NO GROWTH
SPECIMEN QUALITY:: ADEQUATE

## 2024-02-07 NOTE — Addendum Note (Signed)
Addended by: Alveria Apley on: 02/07/2024 12:58 PM   Modules accepted: Orders

## 2024-02-10 ENCOUNTER — Encounter: Payer: Self-pay | Admitting: Family Medicine

## 2024-02-10 LAB — CERVICOVAGINAL ANCILLARY ONLY
Bacterial Vaginitis (gardnerella): NEGATIVE
Candida Glabrata: POSITIVE — AB
Candida Vaginitis: NEGATIVE
Chlamydia: NEGATIVE
Comment: NEGATIVE
Comment: NEGATIVE
Comment: NEGATIVE
Comment: NEGATIVE
Comment: NEGATIVE
Comment: NORMAL
Neisseria Gonorrhea: NEGATIVE
Trichomonas: NEGATIVE

## 2024-02-11 ENCOUNTER — Ambulatory Visit: Payer: 59 | Admitting: Family Medicine

## 2024-02-11 MED ORDER — FLUCONAZOLE 150 MG PO TABS: 150.0000 mg | ORAL_TABLET | Freq: Once | ORAL | 0 refills | Status: AC

## 2024-02-11 NOTE — Telephone Encounter (Signed)
 Pt form was completed and picked up by pt

## 2024-02-11 NOTE — Telephone Encounter (Signed)
 Medication has been sent to pharmacy.

## 2024-02-26 ENCOUNTER — Telehealth: Admitting: Family Medicine

## 2024-02-26 ENCOUNTER — Encounter: Payer: Self-pay | Admitting: Family Medicine

## 2024-02-26 DIAGNOSIS — B9689 Other specified bacterial agents as the cause of diseases classified elsewhere: Secondary | ICD-10-CM

## 2024-02-26 DIAGNOSIS — N760 Acute vaginitis: Secondary | ICD-10-CM

## 2024-02-26 MED ORDER — METRONIDAZOLE 500 MG PO TABS: 500.0000 mg | ORAL_TABLET | Freq: Three times a day (TID) | ORAL | 0 refills | Status: DC

## 2024-02-26 NOTE — Progress Notes (Signed)
 Subjective:    Patient ID: [name], female    DOB: [birth year], 63 y.o.   MRN: [id number]  HPI Virtual Visit via Video Note  I connected with the patient on 02/26/24 at  8:45 AM EDT by a video enabled telemedicine application and verified that I am speaking with the correct person using two identifiers.  Location patient: home Location provider:work or home office Persons participating in the virtual visit: patient, provider  I discussed the limitations of evaluation and management by telemedicine and the availability of in person appointments. The patient expressed understanding and agreed to proceed.   HPI: Here to follow up on a vaginal DC and urinary burning that started about 6 weeks ago. No urinary urgency or fever. The DC has no odor. This was brown in color at first, and now it has a green color. She was seen here on 02-06-24 for this, and at first it was felt to be a UTI. She was started on Macrobid, but when the urine culture came back as negative, this was stopped. A vaginal swab was also collected at the visit, and this came back as positive for Candida and negative for Gardnerella. She was then given Diflucan. She took both of these pills, but they have had no effect.    ROS: See pertinent positives and negatives per HPI.  Past Medical History:  Diagnosis Date   Allergy    Dr Stevphen [place]   Cardiac arrhythmia    Depression    Diabetes mellitus without complication [facility])    Fibroids    uterine sees Dr. Neva Seat in [place]    Genital warts    Heart murmur    High cholesterol    Hypertension    Irritable bowel syndrome (IBS)    Low back pain    has a herniated lumbar disc sees Dr August Saucer    Past Surgical History:  Procedure Laterality Date   ABDOMINOPLASTY  04/2013   BREAST BIOPSY Right 06/2014   COLONOSCOPY  02/10/2015   per Dr. Rhea Belton, clear, repeat in 10 yrs    LUMBAR LAMINECTOMY  2010   L3-4 on 02-24-10 per Dr Otelia Sergeant    MYOMECTOMY     laser ablation  of uterine fibroids per Dr Dareen Piano 2000   TUBAL LIGATION      Family History  Problem Relation Age of Onset   Diabetes Mother    Hypertension Mother    Diabetes Father    Hypertension Father    Heart disease Father    Breast cancer Sister    Diabetes Maternal Grandmother    Hypertension Maternal Grandmother    Diabetes Maternal Grandfather    Hypertension Maternal Grandfather    Diabetes Paternal Grandmother    Hypertension Paternal Grandmother    Diabetes Paternal Grandfather    Hypertension Paternal Grandfather    Arthritis Other        Family Hx   Diabetes Other        Family Hx 1st degree relative   Hyperlipidemia Other        Family Hx   Hypertension Other        Family Hx   Prostate cancer Other        Family Hx 1st Degree relative <50   Stroke Other        First Degree Female <50   Colon cancer Neg Hx    Esophageal cancer Neg Hx    Pancreatic cancer Neg Hx    Stomach cancer Neg  Hx    Liver disease Neg Hx    Rectal cancer Neg Hx      Current Outpatient Medications:    albuterol (VENTOLIN HFA) 108 (90 Base) MCG/ACT inhaler, Inhale 2 puffs into the lungs every 4 (four) hours as needed for wheezing or shortness of breath., Disp: 18 g, Rfl: 11   amLODipine (NORVASC) 10 MG tablet, TAKE 1 TABLET BY MOUTH DAILY, Disp: 90 tablet, Rfl: 3   atorvastatin (LIPITOR) 20 MG tablet, Take 1 tablet (20 mg total) by mouth daily., Disp: 90 tablet, Rfl: 3   blood glucose meter kit and supplies, Dispense based on patient and insurance preference. Test once a day. E11.9, Disp: 1 each, Rfl: 0   ergocalciferol (VITAMIN D2) 1.25 MG (50000 UT) capsule, Take 50,000 Units by mouth once a week., Disp: , Rfl:    linaclotide (LINZESS) 72 MCG capsule, Take 1 capsule (72 mcg total) by mouth daily before breakfast., Disp: 30 capsule, Rfl: 2   lisinopril (ZESTRIL) 20 MG tablet, Take 1 tablet (20 mg total) by mouth 2 (two) times daily., Disp: 180 tablet, Rfl: 3   Multiple Vitamins-Minerals  (MULTIVITAMIN WITH MINERALS) tablet, Take 1 tablet by mouth daily., Disp: , Rfl:    ondansetron (ZOFRAN-ODT) 4 MG disintegrating tablet, 4mg  ODT q4 hours prn nausea/vomit, Disp: 10 tablet, Rfl: 0   potassium chloride (KLOR-CON M) 10 MEQ tablet, TAKE TWO TABLETS BY MOUTH DAILY, Disp: 180 tablet, Rfl: 3   Semaglutide, 2 MG/DOSE, 8 MG/3ML SOPN, Inject 2 mg into the skin once a week., Disp: 9 mL, Rfl: 3   valACYclovir (VALTREX) 500 MG tablet, TAKE ONE TABLET BY MOUTH TWICE A DAY AS NEEDED FOR OUTBREAKS, Disp: 30 tablet, Rfl: 4  EXAM:  VITALS per patient if applicable:  GENERAL: alert, oriented, appears well and in no acute distress  HEENT: atraumatic, conjunttiva clear, no obvious abnormalities on inspection of external nose and ears  NECK: normal movements of the head and neck  LUNGS: on inspection no signs of respiratory distress, breathing rate appears normal, no obvious gross SOB, gasping or wheezing  CV: no obvious cyanosis  MS: moves all visible extremities without noticeable abnormality  PSYCH/NEURO: pleasant and cooperative, no obvious depression or anxiety, speech and thought processing grossly intact  ASSESSMENT AND PLAN: I believe this has been a bacterial vaginitis all along. We will treat with 7 days of Metronidazole. Follow up as needed.  Gershon Crane, MD  Discussed the following assessment and plan:  No diagnosis found.     I discussed the assessment and treatment plan with the patient. The patient was provided an opportunity to ask questions and all were answered. The patient agreed with the plan and demonstrated an understanding of the instructions.   The patient was advised to call back or seek an in-person evaluation if the symptoms worsen or if the condition fails to improve as anticipated.      Review of Systems     Objective:   Physical Exam        Assessment & Plan:

## 2024-03-09 ENCOUNTER — Other Ambulatory Visit: Payer: Self-pay | Admitting: Family Medicine

## 2024-03-24 ENCOUNTER — Ambulatory Visit: Payer: Self-pay | Admitting: Family Medicine

## 2024-03-24 ENCOUNTER — Other Ambulatory Visit: Payer: Self-pay | Admitting: Family Medicine

## 2024-03-24 NOTE — Telephone Encounter (Signed)
 Copied from CRM [id number]. Topic: Clinical - Medication Refill >> Mar 24, 2024 12:57 PM Turkey A wrote: Most Recent Primary Care Visit:  Provider: Gershon Crane A  Department: [facility]  Visit Type: MYCHART VIDEO VISIT  Date: 02/26/2024  Medication: ergocalciferol (VITAMIN D2) 1.25 MG (50000 UT) capsule  Has the patient contacted their pharmacy? Yes (Agent: If no, request that the patient contact the pharmacy for the refill. If patient does not wish to contact the pharmacy document the reason why and proceed with request.) (Agent: If yes, when and what did the pharmacy advise?) Denied by Doctor  Is this the correct pharmacy for this prescription? Yes If no, delete pharmacy and type the correct one.  This is the patient's preferred pharmacy:  [facility] [id number] [place], [place] - [address] [place] [address] Phone: [phone] Fax: [phone]   Has the prescription been filled recently? No  Is the patient out of the medication? Yes  Has the patient been seen for an appointment in the last year OR does the patient have an upcoming appointment? Yes  Can we respond through MyChart? Yes  Agent: Please be advised that Rx refills may take up to 3 business days. We ask that you follow-up with your pharmacy.

## 2024-03-24 NOTE — Telephone Encounter (Signed)
 Last Fill: Unknown  Last OV: 02/26/24 Next OV: 04/20/24  Routing to provider for review/authorization.

## 2024-03-24 NOTE — Telephone Encounter (Signed)
 Chief Complaint: Left arm pain over one month  Symptoms: Hurts when lifting above head or with movement Frequency: Intermittent Pertinent Negatives: Patient denies radiation of pain, numbness, swelling, rash, chest pain  Disposition:  [x] Appointment (In office)  Additional Notes: Pt scheduled for appointment with PCP on Thursday, 4/10. This RN educated pt on new-worsening symptoms and when to call back/seek emergent care. Pt verbalized understanding and agrees to plan.    Copied from CRM [id number]. Topic: Clinical - Red Word Triage >> Mar 24, 2024  1:00 PM Turkey A wrote: [facility] that prompted transfer to Nurse Triage: Patient is having pain with left arm, 1-10;10 Reason for Disposition  [1] MODERATE pain (e.g., interferes with normal activities) AND [2] present > 3 days  Answer Assessment - Initial Assessment Questions Left arm pain started over one month ago Pain with movement 10/10 Not sure cause; happened once before Denies radiation of pain, numbness, swelling, rash, chest pain  Protocols used: Arm Pain-A-AH

## 2024-03-25 ENCOUNTER — Other Ambulatory Visit: Payer: Self-pay

## 2024-03-25 MED ORDER — ERGOCALCIFEROL 1.25 MG (50000 UT) PO CAPS
50000.0000 [IU] | ORAL_CAPSULE | ORAL | 3 refills | Status: AC
  Filled 2024-03-25: qty 12, 84d supply, fill #0

## 2024-03-25 NOTE — Telephone Encounter (Signed)
 Noted.

## 2024-03-26 ENCOUNTER — Encounter: Payer: Self-pay | Admitting: Family Medicine

## 2024-03-26 ENCOUNTER — Ambulatory Visit: Admitting: Family Medicine

## 2024-03-26 VITALS — BP 100/68 | HR 61 | Temp 98.0°F | Wt 154.0 lb

## 2024-03-26 DIAGNOSIS — M25512 Pain in left shoulder: Secondary | ICD-10-CM

## 2024-03-26 DIAGNOSIS — G8929 Other chronic pain: Secondary | ICD-10-CM

## 2024-03-26 NOTE — Progress Notes (Signed)
   Subjective:    Patient ID: [name], female    DOB: [birth year], 63 y.o.   MRN: [id number]  HPI Here for 3 months of pain and stiffness in the left shoulder. No hx of trauma. Using Ibuprofen.    Review of Systems  Constitutional: Negative.   Respiratory: Negative.    Cardiovascular: Negative.   Musculoskeletal:  Positive for arthralgias.       Objective:   Physical Exam Constitutional:      General: She is not in acute distress.    Appearance: Normal appearance.  Cardiovascular:     Rate and Rhythm: Normal rate and regular rhythm.     Pulses: Normal pulses.     Heart sounds: Normal heart sounds.  Pulmonary:     Effort: Pulmonary effort is normal.     Breath sounds: Normal breath sounds.  Musculoskeletal:     Comments: Left shoulder is tender anteriorly. No crepitus. Her ROM is quite limited by pain   Neurological:     Mental Status: She is alert.           Assessment & Plan:  Left shoulder pain, possibly due to rotator cuff tendonitis. Refer to Sports Medicine.  Gershon Crane, MD

## 2024-03-31 ENCOUNTER — Other Ambulatory Visit: Payer: Self-pay

## 2024-03-31 ENCOUNTER — Encounter: Payer: Self-pay | Admitting: Allergy and Immunology

## 2024-03-31 ENCOUNTER — Ambulatory Visit: Payer: Self-pay | Admitting: Allergy and Immunology

## 2024-03-31 VITALS — BP 116/62 | HR 66 | Temp 98.0°F | Resp 16 | Ht 63.58 in | Wt 163.8 lb

## 2024-03-31 DIAGNOSIS — T781XXD Other adverse food reactions, not elsewhere classified, subsequent encounter: Secondary | ICD-10-CM | POA: Diagnosis not present

## 2024-03-31 DIAGNOSIS — T781XXA Other adverse food reactions, not elsewhere classified, initial encounter: Secondary | ICD-10-CM

## 2024-03-31 MED ORDER — AUVI-Q 0.3 MG/0.3ML IJ SOAJ: 0.3000 mg | INTRAMUSCULAR | 1 refills | Status: AC | PRN

## 2024-03-31 NOTE — Patient Instructions (Addendum)
  1. Blood - shellfish IgE panel  2. Epi-pen???  3. Influenza = Tamiflu. Covid = Paxlovid

## 2024-03-31 NOTE — Progress Notes (Unsigned)
 [place] - [place] - [place] - [place] - [place]   Dear Clent Ridges,  Thank you for referring [name] to the [facility] [place] on 03/31/2024.   Below is a summation of this patient's evaluation and recommendations.  Thank you for your referral. I will keep you informed about this patient's response to treatment.   If you have any questions please do not hesitate to contact me.   Sincerely,  Jessica Priest, MD Allergy / Immunology [facility]   ______________________________________________________________________    NEW PATIENT NOTE  Referring Provider: Nelwyn Salisbury, MD Primary Provider: Nelwyn Salisbury, MD Date of office visit: 03/31/2024    Subjective:   Chief Complaint:  [name] (DOB: November 12, 1961) is a 63 y.o. female who presents to the clinic on 03/31/2024 with a chief complaint of Allergies and Establish Care .     HPI: [name] presents this clinic in evaluation of possible food hypersensitivity.  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  She ate some peanuts and developed some shortness of breath.  She never had problems consuming shellfish.  But both her peanut and her shellfish skin test were positive.  Then approximately 5 years ago she was retested and told that she was no longer allergic to peanut and shellfish.  Since that point in time she has eaten shellfish and peanut with no problem.  However, 2 months ago she ate a collection of oyster, clams, shrimp, and crab and 4 to 6 hours later developed unrelenting vomiting and diarrhea without any associated systemic or constitutional symptoms and without any other atopic symptoms including no skin problems and no respiratory tract problems that lasted into the next day.  Since then she has eaten shrimp with no problem.  She does have a history of some seasonal allergies for which she  will take some Benadryl rarely.  She does have a distant history of asthma for which she has an albuterol inhaler which she has not used in years.  She can exert herself with no problem and does not have any cold air induced bronchospastic symptoms.  Past Medical History:  Diagnosis Date  . Allergy    Dr [name]  . Cardiac arrhythmia   . Depression   . Diabetes mellitus without complication (HCC)   . Eczema   . Fibroids    uterine sees Dr. Neva Allen in [place]   . Genital warts   . Heart murmur   . High cholesterol   . Hypertension   . Irritable bowel syndrome (IBS)   . Low back pain    has a herniated lumbar disc sees Dr [name]    Past Surgical History:  Procedure Laterality Date  . ABDOMINOPLASTY  04/2013  . BREAST BIOPSY Right 06/2014  . COLONOSCOPY  02/10/2015   per Dr. Rhea Allen, clear, repeat in 10 yrs   . LUMBAR LAMINECTOMY  2010   L3-4 on 02-24-10 per Dr [name]   . MYOMECTOMY     laser ablation of uterine fibroids per Dr [name] 2000  . TUBAL LIGATION      Allergies as of 03/31/2024       Reactions   Iodinated Contrast Media Swelling   Tongue swelling   Iodine Swelling   Tongue swelling   Peanut-containing Drug Products Anaphylaxis   Hctz [hydrochlorothiazide] Other (See Comments)   Blood pressure goes up   Shellfish-derived  Products Other (See Comments)    Per allergy test   Cephalexin Rash   Ciprofloxacin Diarrhea   Fentanyl Itching        Medication List    albuterol 108 (90 Base) MCG/ACT inhaler Commonly known as: VENTOLIN HFA Inhale 2 puffs into the lungs every 4 (four) hours as needed for wheezing or shortness of breath.   amLODipine 10 MG tablet Commonly known as: NORVASC TAKE 1 TABLET BY MOUTH DAILY   atorvastatin 20 MG tablet Commonly known as: LIPITOR Take 1 tablet (20 mg total) by mouth daily.   Auvi-Q 0.3 MG/0.3ML Soaj injection Generic drug: EPINEPHrine Inject 0.3 mg into the muscle as needed for anaphylaxis. Started by: [name]  [name]   blood glucose meter kit and supplies Dispense based on patient and insurance preference. Test once a day. E11.9   ergocalciferol 1.25 MG (50000 UT) capsule Commonly known as: VITAMIN D2 Take 1 capsule (50,000 Units total) by mouth once a week.   estradiol 0.1 MG/GM vaginal cream Commonly known as: ESTRACE Insert pea size amount into vagina every night for 2 weeks, then twice weekly for maintenance   linaclotide 72 MCG capsule Commonly known as: Linzess Take 1 capsule (72 mcg total) by mouth daily before breakfast.   lisinopril 20 MG tablet Commonly known as: ZESTRIL Take 1 tablet (20 mg total) by mouth 2 (two) times daily.   metroNIDAZOLE 500 MG tablet Commonly known as: FLAGYL Take 1 tablet (500 mg total) by mouth 3 (three) times daily.   multivitamin with minerals tablet Take 1 tablet by mouth daily.   ondansetron 4 MG disintegrating tablet Commonly known as: ZOFRAN-ODT 4mg  ODT q4 hours prn nausea/vomit   potassium chloride 10 MEQ tablet Commonly known as: KLOR-CON M TAKE TWO TABLETS BY MOUTH DAILY   Semaglutide (2 MG/DOSE) 8 MG/3ML Sopn Inject 2 mg into the skin once a week.   valACYclovir 500 MG tablet Commonly known as: VALTREX TAKE ONE TABLET BY MOUTH TWICE A DAY AS NEEDED FOR OUTBREAKS      Review of systems negative except as noted in HPI / PMHx or noted below:  ROS  Family History  Problem Relation Age of Onset  . Diabetes Mother   . Hypertension Mother   . Diabetes Father   . Hypertension Father   . Heart disease Father   . Asthma Sister   . Allergic rhinitis Sister   . Breast cancer Sister   . Eczema Paternal Aunt   . Diabetes Maternal Grandmother   . Hypertension Maternal Grandmother   . Diabetes Maternal Grandfather   . Hypertension Maternal Grandfather   . Diabetes Paternal Grandmother   . Hypertension Paternal Grandmother   . Diabetes Paternal Grandfather   . Hypertension Paternal Grandfather   . Arthritis Other         Family Hx  . Diabetes Other        Family Hx 1st degree relative  . Hyperlipidemia Other        Family Hx  . Hypertension Other        Family Hx  . Prostate cancer Other        Family Hx 1st Degree relative <50  . Stroke Other        First Degree Female <50  . Colon cancer Neg Hx   . Esophageal cancer Neg Hx   . Pancreatic cancer Neg Hx   . Stomach cancer Neg Hx   . Liver disease Neg Hx   . Rectal  cancer Neg Hx   . Angioedema Neg Hx   . Urticaria Neg Hx     Social History   Socioeconomic History  . Marital status: Married    Spouse name: Not on file  . Number of children: 4  . Years of education: Not on file  . Highest education level: Some college, no degree  Occupational History  . Occupation: Runner, broadcasting/film/video  Tobacco Use  . Smoking status: Former    Current packs/day: 0.00    Average packs/day: 0.5 packs/day for 1 year (0.5 ttl pk-yrs)    Types: Cigarettes    Start date: 12/18/1995    Quit date: 12/17/1996    Years since quitting: 27.3    Passive exposure: Past  . Smokeless tobacco: Never  Vaping Use  . Vaping status: Never Used  Substance and Sexual Activity  . Alcohol use: Yes    Alcohol/week: 0.0 standard drinks of alcohol    Comment: occ  . Drug use: Never  . Sexual activity: Yes    Birth control/protection: Surgical  Other Topics Concern  . Not on file  Social History Narrative  . Not on file   Social Drivers of Health   Financial Resource Strain: Low Risk  (12/16/2023)   Overall Financial Resource Strain (CARDIA)   . Difficulty of Paying Living Expenses: Not hard at all  Food Insecurity: No Food Insecurity (12/16/2023)   Hunger Vital Sign   . Worried About Programme researcher, broadcasting/film/video in the Last Year: Never true   . Ran Out of Food in the Last Year: Never true  Transportation Needs: No Transportation Needs (12/16/2023)   PRAPARE - Transportation   . Lack of Transportation (Medical): No   . Lack of Transportation (Non-Medical): No  Physical Activity: Sufficiently  Active (12/16/2023)   Exercise Vital Sign   . Days of Exercise per Week: 5 days   . Minutes of Exercise per Session: 30 min  Stress: No Stress Concern Present (12/16/2023)   [employer] of Occupational Health - Occupational Stress Questionnaire   . Feeling of Stress : Not at all  Social Connections: Socially Integrated (12/16/2023)   Social Connection and Isolation Panel [NHANES]   . Frequency of Communication with Friends and Family: More than three times a week   . Frequency of Social Gatherings with Friends and Family: Twice a week   . Attends Religious Services: More than 4 times per year   . Active Member of Clubs or Organizations: Yes   . Attends Banker Meetings: More than 4 times per year   . Marital Status: Married  Catering manager Violence: Not on Scientist, research (life sciences) and Social history   Objective:  There were no vitals filed for this visit.      Physical Exam  Diagnostics: Allergy skin tests were performed.   Spirometry was performed and demonstrated an FEV1 of *** @ *** % of predicted. FEV1/FVC = ***  The patient had an Asthma Control Test with the following results:  .     Assessment and Plan:    1. Adverse food reaction, initial encounter     Patient Instructions   1. Blood - shellfish IgE panel  2. Epi-pen???  3. Influenza = Tamiflu. Covid = Paxlovid    Jessica Priest, MD Allergy / Immunology [facility] [place]

## 2024-04-01 ENCOUNTER — Other Ambulatory Visit: Payer: Self-pay | Admitting: Family Medicine

## 2024-04-01 NOTE — Telephone Encounter (Signed)
 Copied from CRM [id number]. Topic: Clinical - Medication Refill >> Apr 01, 2024  3:31 PM Albertha Alosa wrote: Most Recent Primary Care Visit:  Provider: Corita Diego A  Department: [facility]  Visit Type: ACUTE  Date: 03/26/2024  Medication: ergocalciferol (VITAMIN D2) 1.25 MG (50000 UT) capsule  Has the patient contacted their pharmacy? Yes (Agent: If no, request that the patient contact the pharmacy for the refill. If patient does not wish to contact the pharmacy document the reason why and proceed with request.) (Agent: If yes, when and what did the pharmacy advise?)  Is this the correct pharmacy for this prescription? Yes If no, delete pharmacy and type the correct one.  This is the patient's preferred pharmacy:  [facility] [id number] [place], [place] - [address] [place] [address] Phone: [phone] Fax: [phone]   Has the prescription been filled recently? No  Is the patient out of the medication? Yes  Has the patient been seen for an appointment in the last year OR does the patient have an upcoming appointment? Yes  Can we respond through MyChart? Yes  Agent: Please be advised that Rx refills may take up to 3 business days. We ask that you follow-up with your pharmacy.

## 2024-04-02 ENCOUNTER — Encounter: Payer: Self-pay | Admitting: Allergy and Immunology

## 2024-04-02 NOTE — Progress Notes (Deleted)
 [name] [address] [place] [address] Phone: [phone] Subjective:    I'm seeing this patient by the request  of:  Stephanie Furth, MD  CC:   [id number]  08/22/2023 Problem with worsening symptoms, did not respond previously to this point we will try an injection with it being greater than 4 years ago. Patient now has had difficulty with her back and I am concerned that with patient not responding as much to the epidurals that further evaluation with a new MRI with her being greater than 44 years old would be necessary. We will monitor to see how much radicular symptoms there is otherwise. Discussed icing regimen and home exercises. Discussed gabapentin and other medications that have been prescribed previously. Follow-up again in 6 to 8 weeks   Updated 04/08/2024 [name] is a 63 y.o. female coming in with complaint of L hip pain       Past Medical History:  Diagnosis Date   Allergy    Dr [name]   Cardiac arrhythmia    Depression    Diabetes mellitus without complication (HCC)    Eczema    Fibroids    uterine sees Dr. Ester Allen in [place]    Genital warts    Heart murmur    High cholesterol    Hypertension    Irritable bowel syndrome (IBS)    Low back pain    has a herniated lumbar disc sees Dr [name]   Past Surgical History:  Procedure Laterality Date   ABDOMINOPLASTY  04/2013   BREAST BIOPSY Right 06/2014   COLONOSCOPY  02/10/2015   per Dr. Bridgett Allen, clear, repeat in 10 yrs    LUMBAR LAMINECTOMY  2010   L3-4 on 02-24-10 per Dr [name]    MYOMECTOMY     laser ablation of uterine fibroids per Dr [name] 2000   TUBAL LIGATION     Social History   Socioeconomic History   Marital status: Married    Spouse name: Not on file   Number of children: 4   Years of education: Not on file   Highest education level: Some college, no degree  Occupational History   Occupation: Runner, broadcasting/film/video  Tobacco Use   Smoking status:  Former    Current packs/day: 0.00    Average packs/day: 0.5 packs/day for 1 year (0.5 ttl pk-yrs)    Types: Cigarettes    Start date: 12/18/1995    Quit date: 12/17/1996    Years since quitting: 27.3    Passive exposure: Past   Smokeless tobacco: Never  Vaping Use   Vaping status: Never Used  Substance and Sexual Activity   Alcohol use: Yes    Alcohol/week: 0.0 standard drinks of alcohol    Comment: occ   Drug use: Never   Sexual activity: Yes    Birth control/protection: Surgical  Other Topics Concern   Not on file  Social History Narrative   Not on file   Social Drivers of Health   Financial Resource Strain: Low Risk  (12/16/2023)   Overall Financial Resource Strain (CARDIA)    Difficulty of Paying Living Expenses: Not hard at all  Food Insecurity: No Food Insecurity (12/16/2023)   Hunger Vital Sign    Worried About Running Out of Food in the Last Year: Never true    Ran Out of Food in the Last Year: Never true  Transportation Needs: No Transportation Needs (12/16/2023)   PRAPARE - Transportation    Lack  of Transportation (Medical): No    Lack of Transportation (Non-Medical): No  Physical Activity: Sufficiently Active (12/16/2023)   Exercise Vital Sign    Days of Exercise per Week: 5 days    Minutes of Exercise per Session: 30 min  Stress: No Stress Concern Present (12/16/2023)   [employer] of Occupational Health - Occupational Stress Questionnaire    Feeling of Stress : Not at all  Social Connections: Socially Integrated (12/16/2023)   Social Connection and Isolation Panel [NHANES]    Frequency of Communication with Friends and Family: More than three times a week    Frequency of Social Gatherings with Friends and Family: Twice a week    Attends Religious Services: More than 4 times per year    Active Member of [employer] or Organizations: Yes    Attends Engineer, structural: More than 4 times per year    Marital Status: Married   Allergies  Allergen  Reactions   Iodinated Contrast Media Swelling    Tongue swelling   Iodine Swelling    Tongue swelling   Peanut-Containing Drug Products Anaphylaxis   Hctz [Hydrochlorothiazide] Other (See Comments)    Blood pressure goes up   [employer] Other (See Comments)     Per allergy test   Cephalexin Rash   Ciprofloxacin Diarrhea   Fentanyl Itching   Family History  Problem Relation Age of Onset   Diabetes Mother    Hypertension Mother    Diabetes Father    Hypertension Father    Heart disease Father    Asthma Sister    Allergic rhinitis Sister    Breast cancer Sister    Eczema Paternal Aunt    Diabetes Maternal Grandmother    Hypertension Maternal Grandmother    Diabetes Maternal Grandfather    Hypertension Maternal Grandfather    Diabetes Paternal Grandmother    Hypertension Paternal Grandmother    Diabetes Paternal Grandfather    Hypertension Paternal Grandfather    Arthritis Other        Family Hx   Diabetes Other        Family Hx 1st degree relative   Hyperlipidemia Other        Family Hx   Hypertension Other        Family Hx   Prostate cancer Other        Family Hx 1st Degree relative <50   Stroke Other        First Degree Female <50   Colon cancer Neg Hx    Esophageal cancer Neg Hx    Pancreatic cancer Neg Hx    Stomach cancer Neg Hx    Liver disease Neg Hx    Rectal cancer Neg Hx    Angioedema Neg Hx    Urticaria Neg Hx     Current Outpatient Medications (Endocrine & Metabolic):    Semaglutide, 2 MG/DOSE, 8 MG/3ML SOPN, Inject 2 mg into the skin once a week.  Current Outpatient Medications (Cardiovascular):    amLODipine (NORVASC) 10 MG tablet, TAKE 1 TABLET BY MOUTH DAILY   atorvastatin (LIPITOR) 20 MG tablet, Take 1 tablet (20 mg total) by mouth daily.   AUVI-Q 0.3 MG/0.3ML SOAJ injection, Inject 0.3 mg into the muscle as needed for anaphylaxis.   lisinopril (ZESTRIL) 20 MG tablet, Take 1 tablet (20 mg total) by mouth 2 (two) times  daily.  Current Outpatient Medications (Respiratory):    albuterol (VENTOLIN HFA) 108 (90 Base) MCG/ACT inhaler, Inhale 2 puffs into the lungs  every 4 (four) hours as needed for wheezing or shortness of breath. (Patient not taking: Reported on 03/31/2024)    Current Outpatient Medications (Other):    blood glucose meter kit and supplies, Dispense based on patient and insurance preference. Test once a day. E11.9   ergocalciferol (VITAMIN D2) 1.25 MG (50000 UT) capsule, Take 1 capsule (50,000 Units total) by mouth once a week. (Patient taking differently: Take 50,000 Units by mouth once a week. Pt is taking this Rx long term)   estradiol (ESTRACE) 0.1 MG/GM vaginal cream, Insert pea size amount into vagina every night for 2 weeks, then twice weekly for maintenance   linaclotide (LINZESS) 72 MCG capsule, Take 1 capsule (72 mcg total) by mouth daily before breakfast.   metroNIDAZOLE (FLAGYL) 500 MG tablet, Take 1 tablet (500 mg total) by mouth 3 (three) times daily. (Patient not taking: Reported on 03/31/2024)   Multiple Vitamins-Minerals (MULTIVITAMIN WITH MINERALS) tablet, Take 1 tablet by mouth daily.   ondansetron (ZOFRAN-ODT) 4 MG disintegrating tablet, 4mg  ODT q4 hours prn nausea/vomit (Patient not taking: Reported on 03/31/2024)   potassium chloride (KLOR-CON M) 10 MEQ tablet, TAKE TWO TABLETS BY MOUTH DAILY   valACYclovir (VALTREX) 500 MG tablet, TAKE ONE TABLET BY MOUTH TWICE A DAY AS NEEDED FOR OUTBREAKS   Reviewed prior external information including notes and imaging from  primary care provider As well as notes that were available from care everywhere and other healthcare systems.  Past medical history, social, surgical and family history all reviewed in electronic medical record.  No pertanent information unless stated regarding to the chief complaint.   Review of Systems:  No headache, visual changes, nausea, vomiting, diarrhea, constipation, dizziness, abdominal pain, skin rash,  fevers, chills, night sweats, weight loss, swollen lymph nodes, body aches, joint swelling, chest pain, shortness of breath, mood changes. POSITIVE muscle aches  Objective  Last menstrual period 10/18/2015.   General: No apparent distress alert and oriented x3 mood and affect normal, dressed appropriately.  HEENT: Pupils equal, extraocular movements intact  Respiratory: Patient's speak in full sentences and does not appear short of breath  Cardiovascular: No lower extremity edema, non tender, no erythema      Impression and Recommendations:

## 2024-04-06 LAB — ALLERGEN PROFILE, SHELLFISH
Clam IgE: <0.1 kU/L
F023-IgE Crab: <0.1 kU/L
F080-IgE Lobster: <0.1 kU/L
F290-IgE Oyster: <0.1 kU/L
Scallop IgE: <0.1 kU/L
Shrimp IgE: <0.1 kU/L

## 2024-04-08 ENCOUNTER — Ambulatory Visit: Admitting: Family Medicine

## 2024-04-08 NOTE — Progress Notes (Unsigned)
 [name] [address] [place] [address] Phone: [phone] Subjective:   [name] am a scribe for Dr. Felipe Horton.   I'm seeing this patient by the request  of:  Donley Furth, MD  CC: Left shoulder pain  [id number]   08/22/2023 Problem with worsening symptoms, did not respond previously to this point we will try an injection with it being greater than 4 years ago.  Patient now has had difficulty with her back and I am concerned that with patient not responding as much to the epidurals that further evaluation with a new MRI with her being greater than 49 years old would be necessary.  We will monitor to see how much radicular symptoms there is otherwise.  Discussed icing regimen and home exercises.  Discussed gabapentin  and other medications that have been prescribed previously.  Follow-up again in 6 to 8 weeks      04/09/2024 [name] is a 63 y.o. female coming in with complaint of L shoulder pain. Patient states that it started about 3-4 months ago. It is getting worse as time goes on. Taking Ibu and or Tylenol  for the pain. ROM issues and can not take off clothes properly. Pain is waking her up at night. Modified sleeping position. Pain is worse in the morning due to how she has to sleep. Pain radiates up the neck and down the arm. When she went to her other the doctor the pain was not in the next area. However, after the visit and the doctor testing for ROM the pain has been in her neck.        Past Medical History:  Diagnosis Date   Allergy    Dr Darrel Elm   Cardiac arrhythmia    Depression    Diabetes mellitus without complication (HCC)    Eczema    Fibroids    uterine sees Dr. Ester Helms in [place]    Genital warts    Heart murmur    High cholesterol    Hypertension    Irritable bowel syndrome (IBS)    Low back pain    has a herniated lumbar disc sees Dr Rozelle Corning   Past Surgical History:  Procedure Laterality Date    ABDOMINOPLASTY  04/2013   BREAST BIOPSY Right 06/2014   COLONOSCOPY  02/10/2015   per Dr. Bridgett Camps, clear, repeat in 10 yrs    LUMBAR LAMINECTOMY  2010   L3-4 on 02-24-10 per Dr Richardo Chandler    MYOMECTOMY     laser ablation of uterine fibroids per Dr Alva Jewels 2000   TUBAL LIGATION     Social History   Socioeconomic History   Marital status: Married    Spouse name: Not on file   Number of children: 4   Years of education: Not on file   Highest education level: Some college, no degree  Occupational History   Occupation: Runner, broadcasting/film/video  Tobacco Use   Smoking status: Former    Current packs/day: 0.00    Average packs/day: 0.5 packs/day for 1 year (0.5 ttl pk-yrs)    Types: Cigarettes    Start date: 12/18/1995    Quit date: 12/17/1996    Years since quitting: 27.3    Passive exposure: Past   Smokeless tobacco: Never  Vaping Use   Vaping status: Never Used  Substance and Sexual Activity   Alcohol use: Yes    Alcohol/week: 0.0 standard drinks of alcohol    Comment: occ   Drug use:  Never   Sexual activity: Yes    Birth control/protection: Surgical  Other Topics Concern   Not on file  Social History Narrative   Not on file   Social Drivers of Health   Financial Resource Strain: Low Risk  (12/16/2023)   Overall Financial Resource Strain (CARDIA)    Difficulty of Paying Living Expenses: Not hard at all  Food Insecurity: No Food Insecurity (12/16/2023)   Hunger Vital Sign    Worried About Running Out of Food in the Last Year: Never true    Ran Out of Food in the Last Year: Never true  Transportation Needs: No Transportation Needs (12/16/2023)   PRAPARE - Administrator, Civil Service (Medical): No    Lack of Transportation (Non-Medical): No  Physical Activity: Sufficiently Active (12/16/2023)   Exercise Vital Sign    Days of Exercise per Week: 5 days    Minutes of Exercise per Session: 30 min  Stress: No Stress Concern Present (12/16/2023)   [employer] of  Occupational Health - Occupational Stress Questionnaire    Feeling of Stress : Not at all  Social Connections: Socially Integrated (12/16/2023)   Social Connection and Isolation Panel [NHANES]    Frequency of Communication with Friends and Family: More than three times a week    Frequency of Social Gatherings with Friends and Family: Twice a week    Attends Religious Services: More than 4 times per year    Active Member of [employer] or Organizations: Yes    Attends Engineer, structural: More than 4 times per year    Marital Status: Married   Allergies  Allergen Reactions   Iodinated Contrast Media Swelling    Tongue swelling   Iodine Swelling    Tongue swelling   Peanut-Containing Drug Products Anaphylaxis   Hctz [Hydrochlorothiazide] Other (See Comments)    Blood pressure goes up   [employer] Other (See Comments)     Per allergy test   Cephalexin Rash   Ciprofloxacin  Diarrhea   Fentanyl  Itching   Family History  Problem Relation Age of Onset   Diabetes Mother    Hypertension Mother    Diabetes Father    Hypertension Father    Heart disease Father    Asthma Sister    Allergic rhinitis Sister    Breast cancer Sister    Eczema Paternal Aunt    Diabetes Maternal Grandmother    Hypertension Maternal Grandmother    Diabetes Maternal Grandfather    Hypertension Maternal Grandfather    Diabetes Paternal Grandmother    Hypertension Paternal Grandmother    Diabetes Paternal Grandfather    Hypertension Paternal Grandfather    Arthritis Other        Family Hx   Diabetes Other        Family Hx 1st degree relative   Hyperlipidemia Other        Family Hx   Hypertension Other        Family Hx   Prostate cancer Other        Family Hx 1st Degree relative <50   Stroke Other        First Degree Female <50   Colon cancer Neg Hx    Esophageal cancer Neg Hx    Pancreatic cancer Neg Hx    Stomach cancer Neg Hx    Liver disease Neg Hx    Rectal cancer Neg Hx     Angioedema Neg Hx    Urticaria Neg Hx  Current Outpatient Medications (Endocrine & Metabolic):    Semaglutide , 2 MG/DOSE, 8 MG/3ML SOPN, Inject 2 mg into the skin once a week.  Current Outpatient Medications (Cardiovascular):    amLODipine  (NORVASC ) 10 MG tablet, TAKE 1 TABLET BY MOUTH DAILY   atorvastatin  (LIPITOR) 20 MG tablet, Take 1 tablet (20 mg total) by mouth daily.   AUVI-Q  0.3 MG/0.3ML SOAJ injection, Inject 0.3 mg into the muscle as needed for anaphylaxis.   lisinopril  (ZESTRIL ) 20 MG tablet, Take 1 tablet (20 mg total) by mouth 2 (two) times daily.  Current Outpatient Medications (Respiratory):    albuterol  (VENTOLIN  HFA) 108 (90 Base) MCG/ACT inhaler, Inhale 2 puffs into the lungs every 4 (four) hours as needed for wheezing or shortness of breath.    Current Outpatient Medications (Other):    blood glucose meter kit and supplies, Dispense based on patient and insurance preference. Test once a day. E11.9   ergocalciferol  (VITAMIN D2) 1.25 MG (50000 UT) capsule, Take 1 capsule (50,000 Units total) by mouth once a week. (Patient taking differently: Take 50,000 Units by mouth once a week. Pt is taking this Rx long term)   estradiol (ESTRACE) 0.1 MG/GM vaginal cream, Insert pea size amount into vagina every night for 2 weeks, then twice weekly for maintenance   linaclotide  (LINZESS ) 72 MCG capsule, Take 1 capsule (72 mcg total) by mouth daily before breakfast.   metroNIDAZOLE  (FLAGYL ) 500 MG tablet, Take 1 tablet (500 mg total) by mouth 3 (three) times daily.   Multiple Vitamins-Minerals (MULTIVITAMIN WITH MINERALS) tablet, Take 1 tablet by mouth daily.   ondansetron  (ZOFRAN -ODT) 4 MG disintegrating tablet, 4mg  ODT q4 hours prn nausea/vomit   potassium chloride  (KLOR-CON  M) 10 MEQ tablet, TAKE TWO TABLETS BY MOUTH DAILY   valACYclovir  (VALTREX ) 500 MG tablet, TAKE ONE TABLET BY MOUTH TWICE A DAY AS NEEDED FOR OUTBREAKS   Reviewed prior external information including notes  and imaging from  primary care provider As well as notes that were available from care everywhere and other healthcare systems.  Past medical history, social, surgical and family history all reviewed in electronic medical record.  No pertanent information unless stated regarding to the chief complaint.   Review of Systems:  No headache, visual changes, nausea, vomiting, diarrhea, constipation, dizziness, abdominal pain, skin rash, fevers, chills, night sweats, weight loss, swollen lymph nodes, body aches, joint swelling, chest pain, shortness of breath, mood changes. POSITIVE muscle aches  Objective  Blood pressure 120/72, pulse 73, height 5' 3.8" (1.621 m), weight 157 lb (71.2 kg), last menstrual period 10/18/2015, SpO2 99%.   General: No apparent distress alert and oriented x3 mood and affect normal, dressed appropriately.  HEENT: Pupils equal, extraocular movements intact  Respiratory: Patient's speak in full sentences and does not appear short of breath  Cardiovascular: No lower extremity edema, non tender, no erythema  Left shoulder does have positive impingement noted.  Does have some limited range of motion noted.  Positive crossover noted.  Procedure: Real-time Ultrasound Guided Injection of left glenohumeral joint Device: GE Logiq E  Ultrasound guided injection is preferred based studies that show increased duration, increased effect, greater accuracy, decreased procedural pain, increased response rate with ultrasound guided versus blind injection.  Verbal informed consent obtained.  Time-out conducted.  Noted no overlying erythema, induration, or other signs of local infection.  Skin prepped in a sterile fashion.  Local anesthesia: Topical Ethyl chloride.  With sterile technique and under real time ultrasound guidance:  Joint visualized.  21g 2  inch needle inserted posterior approach. Pictures taken for needle placement. Patient did have injection of 2 cc of 0.5% Marcaine, and 1cc  of Kenalog  40 mg/dL. Completed without difficulty  Pain immediately resolved suggesting accurate placement of the medication.  Advised to call if fevers/chills, erythema, induration, drainage, or persistent bleeding.  Images permanently stored  Impression: Technically successful ultrasound guided injection.  Procedure: Real-time Ultrasound Guided Injection of left acromioclavicular joint Device: GE Logiq Q7 Ultrasound guided injection is preferred based studies that show increased duration, increased effect, greater accuracy, decreased procedural pain, increased response rate, and decreased cost with ultrasound guided versus blind injection.  Verbal informed consent obtained.  Time-out conducted.  Noted no overlying erythema, induration, or other signs of local infection.  Skin prepped in a sterile fashion.  Local anesthesia: Topical Ethyl chloride.  With sterile technique and under real time ultrasound guidance: With a 25-gauge half inch needle injected with 0.5 cc of 0.5% Marcaine and 0.5 cc of Kenalog  40 mg/mL Completed without difficulty  Pain immediately resolved suggesting accurate placement of the medication.  Advised to call if fevers/chills, erythema, induration, drainage, or persistent bleeding.  Impression: Technically successful ultrasound guided injection.   Impression and Recommendations:    The above documentation has been reviewed and is accurate and complete [place] [place] M Lanyla Costello, DO

## 2024-04-09 ENCOUNTER — Ambulatory Visit (INDEPENDENT_AMBULATORY_CARE_PROVIDER_SITE_OTHER)

## 2024-04-09 ENCOUNTER — Encounter: Payer: Self-pay | Admitting: Family Medicine

## 2024-04-09 ENCOUNTER — Ambulatory Visit: Admitting: Family Medicine

## 2024-04-09 ENCOUNTER — Other Ambulatory Visit: Payer: Self-pay

## 2024-04-09 VITALS — BP 120/72 | HR 73 | Ht 63.8 in | Wt 157.0 lb

## 2024-04-09 DIAGNOSIS — M25412 Effusion, left shoulder: Secondary | ICD-10-CM | POA: Diagnosis not present

## 2024-04-09 DIAGNOSIS — M25512 Pain in left shoulder: Secondary | ICD-10-CM

## 2024-04-09 DIAGNOSIS — M75102 Unspecified rotator cuff tear or rupture of left shoulder, not specified as traumatic: Secondary | ICD-10-CM | POA: Diagnosis not present

## 2024-04-09 NOTE — Assessment & Plan Note (Signed)
 Positive impingement noted on exam.  Patient has significant voluntary guarding that we need to continue to monitor.  Have a hard to test with patient strength is intact.  Significant hypoechoic changes are noted in the ultrasound today.  Over this will be significantly better though.  Discussed icing regimen and home exercises, icing regimen.  Follow-up again in 6 to 8 weeks otherwise

## 2024-04-09 NOTE — Patient Instructions (Addendum)
 Good to see you. X-ray on the way out. Injected shoulder today.  Keep hands in Periferal vision. Return in 7- 8 weeks.

## 2024-04-09 NOTE — Assessment & Plan Note (Signed)
 Seems to be a new problem.  Seems to have the infusion.  Will get x-rays to further evaluate for any bony abnormality that is contributing as well.  Discussed icing regimen.  Follow-up again in 6 to 8 weeks

## 2024-04-10 ENCOUNTER — Other Ambulatory Visit: Payer: Self-pay | Admitting: Family Medicine

## 2024-04-16 ENCOUNTER — Encounter: Payer: Self-pay | Admitting: Family Medicine

## 2024-04-20 ENCOUNTER — Encounter: Payer: Self-pay | Admitting: Family Medicine

## 2024-04-20 ENCOUNTER — Ambulatory Visit (INDEPENDENT_AMBULATORY_CARE_PROVIDER_SITE_OTHER): Payer: Self-pay | Admitting: Family Medicine

## 2024-04-20 VITALS — BP 98/64 | HR 65 | Temp 98.2°F | Ht 63.25 in | Wt 154.0 lb

## 2024-04-20 DIAGNOSIS — E559 Vitamin D deficiency, unspecified: Secondary | ICD-10-CM

## 2024-04-20 DIAGNOSIS — Z0000 Encounter for general adult medical examination without abnormal findings: Secondary | ICD-10-CM | POA: Diagnosis not present

## 2024-04-20 LAB — BASIC METABOLIC PANEL WITH GFR
BUN: 12 mg/dL (ref 6–23)
CO2: 26 meq/L (ref 19–32)
Calcium: 9.2 mg/dL (ref 8.4–10.5)
Chloride: 108 meq/L (ref 96–112)
Creatinine, Ser: 0.75 mg/dL (ref 0.40–1.20)
GFR: 86.06 mL/min (ref >60.00)
Glucose, Bld: 95 mg/dL (ref 70–99)
Potassium: 3.9 meq/L (ref 3.5–5.1)
Sodium: 142 meq/L (ref 135–145)

## 2024-04-20 LAB — CBC WITH DIFFERENTIAL/PLATELET
Basophils Absolute: 0 10*3/uL (ref 0.0–0.1)
Basophils Relative: 0.4 % (ref 0.0–3.0)
Eosinophils Absolute: 0.1 10*3/uL (ref 0.0–0.7)
Eosinophils Relative: 1.7 % (ref 0.0–5.0)
HCT: 41.3 % (ref 36.0–46.0)
Hemoglobin: 13.7 g/dL (ref 12.0–15.0)
Lymphocytes Relative: 29.1 % (ref 12.0–46.0)
Lymphs Abs: 2.5 10*3/uL (ref 0.7–4.0)
MCHC: 33.1 g/dL (ref 30.0–36.0)
MCV: 89.9 fl (ref 78.0–100.0)
Monocytes Absolute: 0.7 10*3/uL (ref 0.1–1.0)
Monocytes Relative: 7.8 % (ref 3.0–12.0)
Neutro Abs: 5.3 10*3/uL (ref 1.4–7.7)
Neutrophils Relative %: 61 % (ref 43.0–77.0)
Platelets: 410 10*3/uL — ABNORMAL HIGH (ref 150.0–400.0)
RBC: 4.6 Mil/uL (ref 3.87–5.11)
RDW: 14.7 % (ref 11.5–15.5)
WBC: 8.6 10*3/uL (ref 4.0–10.5)

## 2024-04-20 LAB — HEMOGLOBIN A1C: Hgb A1c MFr Bld: 5.7 % (ref 4.6–6.5)

## 2024-04-20 LAB — HEPATIC FUNCTION PANEL
ALT: 30 U/L (ref 0–35)
AST: 16 U/L (ref 0–37)
Albumin: 4.3 g/dL (ref 3.5–5.2)
Alkaline Phosphatase: 108 U/L (ref 39–117)
Bilirubin, Direct: 0.1 mg/dL (ref 0.0–0.3)
Total Bilirubin: 0.6 mg/dL (ref 0.2–1.2)
Total Protein: 6.6 g/dL (ref 6.0–8.3)

## 2024-04-20 LAB — LIPID PANEL
Cholesterol: 148 mg/dL (ref 0–200)
HDL: 49.6 mg/dL (ref >39.00)
LDL Cholesterol: 85 mg/dL (ref 0–99)
NonHDL: 97.98
Total CHOL/HDL Ratio: 3
Triglycerides: 64 mg/dL (ref 0.0–149.0)
VLDL: 12.8 mg/dL (ref 0.0–40.0)

## 2024-04-20 LAB — TSH: TSH: 1.68 u[IU]/mL (ref 0.35–5.50)

## 2024-04-20 LAB — VITAMIN D 25 HYDROXY (VIT D DEFICIENCY, FRACTURES): VITD: 49.28 ng/mL (ref 30.00–100.00)

## 2024-04-20 MED ORDER — POTASSIUM CHLORIDE CRYS ER 10 MEQ PO TBCR: 20.0000 meq | EXTENDED_RELEASE_TABLET | Freq: Every day | ORAL | 3 refills | Status: AC

## 2024-04-20 MED ORDER — SEMAGLUTIDE (2 MG/DOSE) 8 MG/3ML ~~LOC~~ SOPN: 2.0000 mg | PEN_INJECTOR | SUBCUTANEOUS | 3 refills | Status: AC

## 2024-04-20 MED ORDER — ATORVASTATIN CALCIUM 20 MG PO TABS: 20.0000 mg | ORAL_TABLET | Freq: Every day | ORAL | 3 refills | Status: AC

## 2024-04-20 MED ORDER — LISINOPRIL 10 MG PO TABS: 10.0000 mg | ORAL_TABLET | Freq: Every day | ORAL | 3 refills | Status: AC

## 2024-04-20 NOTE — Progress Notes (Signed)
   Subjective:    Patient ID: [name], female    DOB: Oct 31, 1961, 63 y.o.   MRN: [id number]  HPI Here for a well exam. She feels well in general . She still see Dr. Napolean Backbone for left shoulder pain, and she has received several steroid injections.    Review of Systems  Constitutional: Negative.   HENT: Negative.    Eyes: Negative.   Respiratory: Negative.    Cardiovascular: Negative.   Gastrointestinal: Negative.   Genitourinary:  Negative for decreased urine volume, difficulty urinating, dyspareunia, dysuria, enuresis, flank pain, frequency, hematuria, pelvic pain and urgency.  Musculoskeletal:  Positive for arthralgias.  Skin: Negative.   Neurological: Negative.  Negative for headaches.  Psychiatric/Behavioral: Negative.         Objective:   Physical Exam Constitutional:      General: She is not in acute distress.    Appearance: Normal appearance. She is well-developed.  HENT:     Head: Normocephalic and atraumatic.     Right Ear: External ear normal.     Left Ear: External ear normal.     Nose: Nose normal.     Mouth/Throat:     Pharynx: No oropharyngeal exudate.  Eyes:     General: No scleral icterus.    Conjunctiva/sclera: Conjunctivae normal.     Pupils: Pupils are equal, round, and reactive to light.  Neck:     Thyroid : No thyromegaly.     Vascular: No JVD.  Cardiovascular:     Rate and Rhythm: Normal rate and regular rhythm.     Pulses: Normal pulses.     Heart sounds: Normal heart sounds. No murmur heard.    No friction rub. No gallop.  Pulmonary:     Effort: Pulmonary effort is normal. No respiratory distress.     Breath sounds: Normal breath sounds. No wheezing or rales.  Chest:     Chest wall: No tenderness.  Abdominal:     General: Bowel sounds are normal. There is no distension.     Palpations: Abdomen is soft. There is no mass.     Tenderness: There is no abdominal tenderness. There is no guarding or rebound.  Musculoskeletal:         General: No tenderness. Normal range of motion.     Cervical back: Normal range of motion and neck supple.  Lymphadenopathy:     Cervical: No cervical adenopathy.  Skin:    General: Skin is warm and dry.     Findings: No erythema or rash.  Neurological:     General: No focal deficit present.     Mental Status: She is alert and oriented to person, place, and time.     Cranial Nerves: No cranial nerve deficit.     Motor: No abnormal muscle tone.     Coordination: Coordination normal.     Deep Tendon Reflexes: Reflexes are normal and symmetric. Reflexes normal.  Psychiatric:        Mood and Affect: Mood normal.        Behavior: Behavior normal.        Thought Content: Thought content normal.        Judgment: Judgment normal.           Assessment & Plan:  Well exam. We discussed diet and exercise. Get fasting labs. Corita Diego, MD

## 2024-04-22 ENCOUNTER — Encounter: Payer: Self-pay | Admitting: *Deleted

## 2024-05-07 ENCOUNTER — Telehealth: Payer: Self-pay | Admitting: Family Medicine

## 2024-05-07 ENCOUNTER — Other Ambulatory Visit: Payer: Self-pay

## 2024-05-07 DIAGNOSIS — M25512 Pain in left shoulder: Secondary | ICD-10-CM

## 2024-05-07 NOTE — Telephone Encounter (Signed)
Patient called back following up on this message °

## 2024-05-07 NOTE — Telephone Encounter (Signed)
 Patient called to return a call from [place]. She has saved number so it will not go straight to voicemail now.

## 2024-05-08 ENCOUNTER — Encounter: Payer: Self-pay | Admitting: Family Medicine

## 2024-05-13 ENCOUNTER — Ambulatory Visit
Admission: RE | Admit: 2024-05-13 | Discharge: 2024-05-13 | Disposition: A | Discharge status: Home / Self Care | Source: Ambulatory Visit | Attending: Family Medicine | Admitting: Family Medicine

## 2024-05-13 ENCOUNTER — Ambulatory Visit: Admitting: Family Medicine

## 2024-05-13 DIAGNOSIS — M25512 Pain in left shoulder: Secondary | ICD-10-CM

## 2024-05-17 ENCOUNTER — Other Ambulatory Visit

## 2024-05-21 ENCOUNTER — Ambulatory Visit: Payer: Self-pay | Admitting: Family Medicine

## 2024-05-21 DIAGNOSIS — M75102 Unspecified rotator cuff tear or rupture of left shoulder, not specified as traumatic: Secondary | ICD-10-CM

## 2024-05-21 DIAGNOSIS — M25412 Effusion, left shoulder: Secondary | ICD-10-CM

## 2024-05-21 DIAGNOSIS — M25512 Pain in left shoulder: Secondary | ICD-10-CM

## 2024-05-22 ENCOUNTER — Telehealth: Payer: Self-pay | Admitting: Family Medicine

## 2024-05-22 NOTE — Telephone Encounter (Signed)
 Pt tried to respond to Dr. Arlena Belts about her results via MyChart, but her msg did not come through.  Pt would like to proceed with the procedure mentioned of having the bone shaved off surgically.  Pt aware provider out of office til 05/26/2024.

## 2024-05-26 ENCOUNTER — Other Ambulatory Visit: Payer: Self-pay

## 2024-05-26 DIAGNOSIS — M25412 Effusion, left shoulder: Secondary | ICD-10-CM

## 2024-08-02 ENCOUNTER — Other Ambulatory Visit: Payer: Self-pay | Admitting: Internal Medicine

## 2024-09-02 ENCOUNTER — Ambulatory Visit: Admitting: Family Medicine

## 2024-09-02 ENCOUNTER — Encounter: Payer: Self-pay | Admitting: Family Medicine

## 2024-09-02 VITALS — BP 110/60 | HR 63 | Temp 98.0°F | Wt 154.0 lb

## 2024-09-02 DIAGNOSIS — K5900 Constipation, unspecified: Secondary | ICD-10-CM

## 2024-09-02 DIAGNOSIS — Z23 Encounter for immunization: Secondary | ICD-10-CM

## 2024-09-02 DIAGNOSIS — K219 Gastro-esophageal reflux disease without esophagitis: Secondary | ICD-10-CM

## 2024-09-02 MED ORDER — OMEPRAZOLE 40 MG PO CPDR: 40.0000 mg | DELAYED_RELEASE_CAPSULE | Freq: Every day | ORAL | 11 refills | Status: AC

## 2024-09-02 MED ORDER — LINACLOTIDE 72 MCG PO CAPS: 72.0000 ug | ORAL_CAPSULE | Freq: Every day | ORAL | 11 refills | Status: AC

## 2024-09-02 NOTE — Progress Notes (Signed)
   Subjective:    Patient ID: [name], female    DOB: Apr 21, 1961, 63 y.o.   MRN: [id number]  HPI Here for the sensation of a lump in her throat that started 3 days ago and then went away yesterday. This cused a mild pressure and a mild pain in her chest. No SOB. During these 2 days she could swallow food and liquids but they took a long time to go down. No choking. She used to take Omeprazole  for GERD but she stopped last year. She admits that she has felt more heartburn in the past few weeks. She was given Linzess  by the GI office to help her bowels move, and it worked well. She tried to stop this a few weeks ago, but she has become constipated again. She wants to get back on this medication.    Review of Systems  Constitutional: Negative.   HENT:  Positive for trouble swallowing. Negative for sore throat.   Respiratory: Negative.    Cardiovascular:  Positive for chest pain.  Gastrointestinal:  Positive for constipation. Negative for abdominal distention, abdominal pain, blood in stool, diarrhea, nausea, rectal pain and vomiting.       Objective:   Physical Exam Constitutional:      Appearance: Normal appearance.  Cardiovascular:     Rate and Rhythm: Normal rate and regular rhythm.     Pulses: Normal pulses.     Heart sounds: Normal heart sounds.  Pulmonary:     Effort: Pulmonary effort is normal.     Breath sounds: Normal breath sounds.  Neurological:     Mental Status: She is alert.           Assessment & Plan:  For the GERD, she will start back on Omeprazole  40 mg every day. She likely had some esophageal spasms in the past few days, and GERD likely caused this. For the constipation, she will get back on Linzess  72 mg daily.  Garnette Olmsted, MD

## 2024-10-05 ENCOUNTER — Ambulatory Visit: Admitting: Family Medicine

## 2024-10-05 ENCOUNTER — Encounter: Payer: Self-pay | Admitting: Family Medicine

## 2024-10-05 VITALS — BP 124/80 | HR 59 | Temp 97.7°F | Wt 154.4 lb

## 2024-10-05 DIAGNOSIS — M79641 Pain in right hand: Secondary | ICD-10-CM

## 2024-10-05 NOTE — Addendum Note (Signed)
 Addended by: JOHNNY SENIOR A on: 10/05/2024 11:25 AM   Modules accepted: Level of Service

## 2024-10-05 NOTE — Progress Notes (Signed)
   Subjective:    Patient ID: [name], female    DOB: [birth year], 63 y.o.   MRN: [id number]  HPI Here for pain in the right hand since she struck the hand against a railing one month ago. The pain is centered in the palm and in the third finger. At first the palm was black and blue, but not now.    Review of Systems  Constitutional: Negative.   Respiratory: Negative.    Cardiovascular: Negative.   Musculoskeletal:  Positive for arthralgias.       Objective:   Physical Exam Constitutional:      Appearance: Normal appearance.  Cardiovascular:     Rate and Rhythm: Normal rate and regular rhythm.     Pulses: Normal pulses.     Heart sounds: Normal heart sounds.  Pulmonary:     Effort: Pulmonary effort is normal.     Breath sounds: Normal breath sounds.  Musculoskeletal:     Comments: She is tender in the right palm along the flexor tendon of the third finger   Neurological:     Mental Status: She is alert.           Assessment & Plan:  Right hand pain, refer to Hand Surgery. Garnette Olmsted, MD

## 2024-11-02 ENCOUNTER — Encounter: Payer: Self-pay | Admitting: Family Medicine

## 2024-11-02 ENCOUNTER — Ambulatory Visit: Admitting: Family Medicine

## 2024-11-02 VITALS — BP 104/60 | HR 55 | Temp 98.0°F | Wt 152.0 lb

## 2024-11-02 DIAGNOSIS — J019 Acute sinusitis, unspecified: Secondary | ICD-10-CM

## 2024-11-02 LAB — POCT INFLUENZA A/B
Influenza A, POC: NEGATIVE
Influenza B, POC: NEGATIVE

## 2024-11-02 LAB — POC COVID19 BINAXNOW: SARS Coronavirus 2 Ag: NEGATIVE

## 2024-11-02 LAB — POCT RAPID STREP A (OFFICE): Rapid Strep A Screen: NEGATIVE

## 2024-11-02 MED ORDER — AZITHROMYCIN 250 MG PO TABS
ORAL_TABLET | ORAL | 0 refills | Status: AC
Start: 2024-11-02 — End: ?

## 2024-11-02 NOTE — Addendum Note (Signed)
 Addended by: LADONNA INOCENTE SAILOR on: 11/02/2024 09:23 AM   Modules accepted: Orders

## 2024-11-02 NOTE — Progress Notes (Signed)
   Subjective:    Patient ID: [name], female    DOB: [birth year], 63 y.o.   MRN: [id number]  HPI Here for 10 days of sinus congestion, PND, ST, and blowing green mucus from the nose. No fever. Taking Nyquil.    Review of Systems  Constitutional: Negative.   HENT:  Positive for congestion, postnasal drip, sinus pressure and sore throat. Negative for ear pain.   Eyes: Negative.   Respiratory:  Positive for cough. Negative for shortness of breath and wheezing.        Objective:   Physical Exam Constitutional:      Appearance: Normal appearance.  HENT:     Right Ear: Tympanic membrane and ear canal normal.     Left Ear: Tympanic membrane, ear canal and external ear normal.     Nose: Nose normal.     Mouth/Throat:     Pharynx: Oropharynx is clear.  Eyes:     Conjunctiva/sclera: Conjunctivae normal.  Pulmonary:     Effort: Pulmonary effort is normal.     Breath sounds: Normal breath sounds.  Lymphadenopathy:     Cervical: No cervical adenopathy.  Neurological:     Mental Status: She is alert.           Assessment & Plan:  Sinusitis, treat with a Zpack.  Garnette Olmsted, MD

## 2024-12-31 ENCOUNTER — Ambulatory Visit: Payer: Self-pay

## 2024-12-31 NOTE — Telephone Encounter (Signed)
 Noted

## 2024-12-31 NOTE — Telephone Encounter (Signed)
 FYI Only or Action Required?: FYI only for provider: ED advised.  Patient was last seen in primary care on 11/02/2024 by Johnny Garnette LABOR, MD.  Called Nurse Triage reporting No chief complaint on file..  Symptoms began yesterday.  Interventions attempted: Rest, hydration, or home remedies.  Symptoms are: unchanged.  Triage Disposition: Call EMS 911 Now  Patient/caregiver understands and will follow disposition?: Yes but plans to have family give her a ride   Reason for Disposition  [1] Chest pain lasts > 5 minutes AND [2] age > 49  Answer Assessment - Initial Assessment Questions Patient reports on 12/30/24 she began feeling unwell of low energy and weakness, she noted chest pain last evening that radiates to her back, abdominal pain, increased belching. She thought her symptoms may be related to her sugar, current blood sugar is 105 after eating.  Emergent evaluation advised, she plans to call for a ride vs 911.    1. LOCATION: Where does it hurt?       Chest pain  2. RADIATION: Does the pain go anywhere else? (e.g., into neck, jaw, arms, back)     Back  3. ONSET: When did the chest pain begin? (Minutes, hours or days)      12/30/24 4. PATTERN: Does the pain come and go, or has it been constant since it started?  Does it get worse with exertion?      Constant  5. DURATION: How long does it last (e.g., seconds, minutes, hours)     Since yesterday  6. SEVERITY: How bad is the pain?  (e.g., Scale 1-10; mild, moderate, or severe)     5/10 7. CARDIAC RISK FACTORS: Do you have any history of heart problems or risk factors for heart disease? (e.g., angina, prior heart attack; diabetes, high blood pressure, high cholesterol, smoker, or strong family history of heart disease)     yes 8. PULMONARY RISK FACTORS: Do you have any history of lung disease?  (e.g., blood clots in lung, asthma, emphysema, birth control pills)      9. CAUSE: What do you think is causing the  chest pain?     Unsure  10. OTHER SYMPTOMS: Do you have any other symptoms? (e.g., dizziness, nausea, vomiting, sweating, fever, difficulty breathing, cough)       Blood sugar 105, belching  Protocols used: Chest Pain-A-AH  Copied from CRM #[id number]. Topic: Clinical - Red Word Triage >> Dec 31, 2024 11:17 AM China J wrote: [facility] that prompted transfer to Nurse Triage: Patient is having stomach pain that shoots into her pack and into her chest. She is also experiencing weakness that is making it hard for her to speak. Patient also said she has trouble breathing, along with belching.
